# Patient Record
Sex: Female | Born: 1965
Health system: Southern US, Community
[De-identification: ages and names within clinical notes are randomized; demographics above are authoritative.]

## PROBLEM LIST (undated history)

## (undated) DIAGNOSIS — G43909 Migraine, unspecified, not intractable, without status migrainosus: Secondary | ICD-10-CM

## (undated) DIAGNOSIS — I341 Nonrheumatic mitral (valve) prolapse: Secondary | ICD-10-CM

## (undated) DIAGNOSIS — K219 Gastro-esophageal reflux disease without esophagitis: Secondary | ICD-10-CM

## (undated) DIAGNOSIS — F419 Anxiety disorder, unspecified: Secondary | ICD-10-CM

## (undated) DIAGNOSIS — E785 Hyperlipidemia, unspecified: Secondary | ICD-10-CM

## (undated) DIAGNOSIS — IMO0002 Reserved for concepts with insufficient information to code with codable children: Secondary | ICD-10-CM

## (undated) DIAGNOSIS — R7303 Prediabetes: Secondary | ICD-10-CM

## (undated) DIAGNOSIS — C50919 Malignant neoplasm of unspecified site of unspecified female breast: Secondary | ICD-10-CM

## (undated) DIAGNOSIS — IMO0001 Reserved for inherently not codable concepts without codable children: Secondary | ICD-10-CM

## (undated) DIAGNOSIS — N920 Excessive and frequent menstruation with regular cycle: Secondary | ICD-10-CM

## (undated) DIAGNOSIS — R87619 Unspecified abnormal cytological findings in specimens from cervix uteri: Secondary | ICD-10-CM

## (undated) DIAGNOSIS — K589 Irritable bowel syndrome without diarrhea: Secondary | ICD-10-CM

## (undated) DIAGNOSIS — J302 Other seasonal allergic rhinitis: Secondary | ICD-10-CM

## (undated) HISTORY — DX: Malignant neoplasm of unspecified site of unspecified female breast: C50.919

## (undated) HISTORY — PX: OTHER SURGICAL HISTORY: SHX169

## (undated) HISTORY — DX: Nonrheumatic mitral (valve) prolapse: I34.1

## (undated) HISTORY — DX: Irritable bowel syndrome, unspecified: K58.9

## (undated) HISTORY — PX: COLONOSCOPY: SHX174

## (undated) HISTORY — DX: Unspecified abnormal cytological findings in specimens from cervix uteri: R87.619

## (undated) HISTORY — DX: Excessive and frequent menstruation with regular cycle: N92.0

## (undated) HISTORY — DX: Reserved for concepts with insufficient information to code with codable children: IMO0002

## (undated) HISTORY — DX: Hyperlipidemia, unspecified: E78.5

## (undated) HISTORY — DX: Migraine, unspecified, not intractable, without status migrainosus: G43.909

---

## 2002-03-06 ENCOUNTER — Other Ambulatory Visit: Admission: RE | Admit: 2002-03-06 | Discharge: 2002-03-06 | Payer: Self-pay | Admitting: *Deleted

## 2002-09-17 ENCOUNTER — Inpatient Hospital Stay (HOSPITAL_COMMUNITY): Admission: AD | Admit: 2002-09-17 | Discharge: 2002-09-19 | Payer: Self-pay | Admitting: Gynecology

## 2002-09-18 ENCOUNTER — Encounter (INDEPENDENT_AMBULATORY_CARE_PROVIDER_SITE_OTHER): Payer: Self-pay | Admitting: Specialist

## 2002-11-06 ENCOUNTER — Other Ambulatory Visit: Admission: RE | Admit: 2002-11-06 | Discharge: 2002-11-06 | Payer: Self-pay | Admitting: Gynecology

## 2003-12-21 ENCOUNTER — Other Ambulatory Visit: Admission: RE | Admit: 2003-12-21 | Discharge: 2003-12-21 | Payer: Self-pay | Admitting: Gynecology

## 2005-01-06 ENCOUNTER — Other Ambulatory Visit: Admission: RE | Admit: 2005-01-06 | Discharge: 2005-01-06 | Payer: Self-pay | Admitting: Obstetrics and Gynecology

## 2005-03-05 ENCOUNTER — Ambulatory Visit (HOSPITAL_COMMUNITY): Admission: RE | Admit: 2005-03-05 | Discharge: 2005-03-05 | Payer: Self-pay | Admitting: Obstetrics and Gynecology

## 2005-07-21 ENCOUNTER — Inpatient Hospital Stay (HOSPITAL_COMMUNITY): Admission: AD | Admit: 2005-07-21 | Discharge: 2005-07-23 | Payer: Self-pay | Admitting: Obstetrics and Gynecology

## 2006-02-11 ENCOUNTER — Other Ambulatory Visit: Admission: RE | Admit: 2006-02-11 | Discharge: 2006-02-11 | Payer: Self-pay | Admitting: Obstetrics and Gynecology

## 2008-01-18 ENCOUNTER — Encounter: Admission: RE | Admit: 2008-01-18 | Discharge: 2008-01-18 | Payer: Self-pay | Admitting: Obstetrics and Gynecology

## 2008-10-24 ENCOUNTER — Encounter: Admission: RE | Admit: 2008-10-24 | Discharge: 2008-10-24 | Payer: Self-pay | Admitting: Psychiatry

## 2009-01-28 ENCOUNTER — Encounter: Admission: RE | Admit: 2009-01-28 | Discharge: 2009-01-28 | Payer: Self-pay | Admitting: Obstetrics and Gynecology

## 2009-06-01 HISTORY — PX: VULVA /PERINEUM BIOPSY: SHX319

## 2010-02-25 ENCOUNTER — Encounter: Admission: RE | Admit: 2010-02-25 | Discharge: 2010-02-25 | Payer: Self-pay | Admitting: Obstetrics and Gynecology

## 2010-10-17 NOTE — H&P (Signed)
[name], [name] NO.:  [id number]   MEDICAL RECORD NO.:  [id number]          PATIENT TYPE:  INP   LOCATION:  9138                          FACILITY:  [facility]   PHYSICIAN:  Janine Limbo, M.D.DATE OF BIRTH:  1966/01/21   DATE OF ADMISSION:  07/21/2005  DATE OF DISCHARGE:                                HISTORY & PHYSICAL   Ms. [name] is a 45 year old gravida 4, para 2-0-1-2 who presents at 41 weeks'  gestation, EDD August 02, 2005.  She presents in advanced active labor,  completely dilated with a strong urge to push, meconium-stained amniotic  fluid is noted.  She delivered rapidly with Dr. Marline Backbone in  attendance.  A spontaneous vaginal delivery with the birth of a viable  female infant, named Debbie [name], with Apgar scores of nine at one-minute and  nine at five minutes.  There was no meconium noted below the cord.  Repair  of lacerations was done by Dr. Marline Backbone.   This patient's pregnancy was followed by the M.D. service at [facility] and has  been essentially unremarkable.  She has been size equal to dates throughout,  normotensive with no proteinuria.  The patient's pregnancy is remarkable  for:  1.  Advanced maternal age, declined amnio.  2.  Irritable bowel syndrome.  3.  Migraines.  4.  Questionable LMP.  5.  Pregnancy remarkable for that the patient does have a history of anxiety      and depression and does take Paxil.   Prenatal lab work, on February 09, 2005, hemoglobin and hematocrit 11.7 and  37.5, platelets 282,000.  Blood type and Rh A+, antibody screen negative,  VDRL nonreactive, rubella immune, hepatitis B surface antigen negative, HIV  declined.  CF testing declined.  Pap smear within normal limits.  GC and  chlamydia negative.  At 28 weeks, one-hour glucose challenge within normal  limits and at 36 weeks culture of the vaginal tract is negative for group B  strep.   The patient did have early pregnancy ultrasound at 10 weeks 2  days on a  January 06, 2005.  On March 06, 2005 the patient had 18-week genetic anatomy  ultrasound.  Anatomy was found to be normal, nasal bone positive, gross  normal, decreasing patient's Down syndrome risk to  1 in 357.   OBSTETRICAL HISTORY:  1.  In 2000, the patient had a normal spontaneous vaginal delivery with the      birth of a 7 pound 1 ounce female infant, named Debbie [name], with no      complications.  2.  In 2003, the patient had a first trimester SAB with no complications.  3.  In 2004, the patient had a normal spontaneous vaginal delivery at 37      weeks with the birth of 5 pound 14 ounce female infant, named Debbie [name], with      no complications.  4.  This is her fourth and current pregnancy.   MEDICAL HISTORY:  1.  Mitral valve prolapse.  2.  Irritable bowel syndrome.  3.  Migraines.  4.  Wisdom tooth extraction in 1981.   FAMILY HISTORY:  The patient's mother and maternal grandmother with chronic  hypertension on medication.  Maternal grandmother with uterine and breast  cancer.   GENETIC HISTORY:  The patient is 45 years old, otherwise there is no family  history of familial or chromosomal disorders, children that were born with  birth defects, or any that died in infancy.   The patient has no known drug allergies.   She denies the use of tobacco, alcohol, or illicit drugs   SOCIAL HISTORY:  [name] is a married Caucasian female.  She is a  Futures trader.  Her husband, Debbie [name], is involved and supportive.  He works  as a Engineer, site.  They do not subscribe to a religious faith.   REVIEW OF SYSTEMS:  As described above.  The patient presents to [facility] [place] in advanced active labor and quickly delivers by  spontaneous vaginal delivery of viable female infant, named Debbie [name], with  Apgar scores of nine at one minute and nine at five minutes with Dr. Marline Backbone in attendance.   PHYSICAL EXAMINATION:  VITAL SIGNS:  Stable.  The patient is  afebrile.  HEENT:  Unremarkable.  HEART:  Regular rate and rhythm.  LUNGS:  Clear.  ABDOMEN:  Nontender.  Fetal heart rate, on admission, showed a reactive NST  and a negative CST.   Vaginal delivery occurred spontaneously with the birth of a viable female  infant, Debbie [name], Apgar nine and nine, repair of second degree midline  laceration and first-degree right and left lacerations was performed by Dr.  Marline Backbone.   ASSESSMENT:  1.  Intrauterine pregnancy at term.  2.  Normal spontaneous vaginal delivery.  3.  Repair of second-degree midline laceration and first degree right and      left vaginal lacerations.   PLAN:  Admit with orders per Dr. Marita [place] stringer      Rica Koyanagi, C.N.M.      Janine Limbo, M.D.  Electronically Signed    SDM/MEDQ  D:  07/21/2005  T:  07/21/2005  Job:  [id number]

## 2010-10-17 NOTE — Discharge Summary (Signed)
   NAME:  Debbie [name], Debbie [name]                             ACCOUNT NO.:  [id number]   MEDICAL RECORD NO.:  [id number]                   PATIENT TYPE:  INP   LOCATION:  9142                                 [facility]ACILITY:  [facility]   PHYSICIAN:  Ivor Costa. Farrel Gobble, M.D.              DATE OF BIRTH:  1966-04-07   DATE OF ADMISSION:  09/17/2002  DATE OF DISCHARGE:  09/19/2002                                 DISCHARGE SUMMARY   DISCHARGE DIAGNOSES:  1. Intrauterine pregnancy at 37 weeks, delivered.  2. Advanced maternal age.  3. Mitral valve prolapse with regurgitation requiring spontaneous bacterial     endocarditis prophylaxis.  4. Status post spontaneous vaginal delivery.  5. Excision of left lower minor cyst at the time of delivery.   HISTORY OF PRESENT ILLNESS:  This is a 45 year old female gravida 3, para 1  with EDC of Oct 05, 2002. Prenatal course was complicated by advanced  maternal age. The patient declined amniocentesis. Also has mitral valve  prolapse with regurgitation requiring SBE prophylaxis.   HOSPITAL COURSE:  On September 17, 2002, the patient was admitted with  spontaneous rupture of membranes, mild contractions. Labor was augmented  with Pitocin and the patient was begun on IV penicillin G for MVP  prophylaxis, SBE prophylaxis. The patient had a spontaneous vaginal delivery  on September 17, 2002, of a female with Apgars of 6 and 7. Weight of 5 pounds 14  ounces. It was noted that pediatrics were alerted and assessed the baby post  delivery secondary to poor cry. Newborn nursery for observation. Cord pH was  obtained and sent though it was reported clotted. There was noted to be a  left lower minor cyst approximately 1.5 cm, which was excised and defect  closed. Postpartum, the patient remained afebrile, voiding, and in stable  condition. She was discharged to home on September 19, 2002, in satisfactory  condition.   LABORATORY DATA:  A+, Rubella immune, alpha IV 1904, hemoglobin 10.3.   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   DISPOSITION:  The patient is discharged to home.   FOLLOW UP:  To return to the office in six weeks. If any problems prior to  that time will be seen in the office.   DISCHARGE MEDICATIONS:  Will continue her own Paxil, which she had started  during pregnancy.     Susa Loffler, P.A.                    Ivor Costa. Farrel Gobble, M.D.    TSG/MEDQ  D:  10/20/2002  T:  10/20/2002  Job:  [id number]

## 2011-03-04 ENCOUNTER — Other Ambulatory Visit: Payer: Self-pay | Admitting: Obstetrics and Gynecology

## 2011-03-04 DIAGNOSIS — Z1231 Encounter for screening mammogram for malignant neoplasm of breast: Secondary | ICD-10-CM

## 2011-03-20 ENCOUNTER — Ambulatory Visit: Payer: Self-pay

## 2011-03-25 ENCOUNTER — Ambulatory Visit
Admission: RE | Admit: 2011-03-25 | Discharge: 2011-03-25 | Disposition: A | Discharge status: Home / Self Care | Payer: BC Managed Care – PPO | Source: Ambulatory Visit | Attending: Obstetrics and Gynecology | Admitting: Obstetrics and Gynecology

## 2011-03-25 DIAGNOSIS — Z1231 Encounter for screening mammogram for malignant neoplasm of breast: Secondary | ICD-10-CM

## 2011-06-02 HISTORY — PX: ENDOMETRIAL ABLATION: SHX621

## 2011-09-01 ENCOUNTER — Encounter (INDEPENDENT_AMBULATORY_CARE_PROVIDER_SITE_OTHER): Payer: BC Managed Care – PPO | Admitting: Obstetrics and Gynecology

## 2011-09-01 DIAGNOSIS — E569 Vitamin deficiency, unspecified: Secondary | ICD-10-CM

## 2011-09-01 DIAGNOSIS — N390 Urinary tract infection, site not specified: Secondary | ICD-10-CM

## 2011-09-01 DIAGNOSIS — N920 Excessive and frequent menstruation with regular cycle: Secondary | ICD-10-CM

## 2011-09-01 DIAGNOSIS — R350 Frequency of micturition: Secondary | ICD-10-CM

## 2011-09-07 ENCOUNTER — Other Ambulatory Visit: Payer: Self-pay

## 2011-09-07 DIAGNOSIS — N926 Irregular menstruation, unspecified: Secondary | ICD-10-CM

## 2011-09-08 ENCOUNTER — Telehealth: Payer: Self-pay | Admitting: Obstetrics and Gynecology

## 2011-09-09 ENCOUNTER — Telehealth: Payer: Self-pay | Admitting: Obstetrics and Gynecology

## 2011-09-09 ENCOUNTER — Telehealth: Payer: Self-pay

## 2011-09-09 NOTE — Telephone Encounter (Signed)
Talk to pt concerning her labs. Informed pt that her tsh was abnormal and she need to schedule appt with pcp(will fax records to Dr. Tally Joe) Also, her vit d level is abnl and will fax rx to pt pharmacy per protocol.

## 2011-09-09 NOTE — Telephone Encounter (Signed)
Patient contacted by Winfred Leeds, medical assistant and advised of [facility] appointment.  Pt. Verified receipt of message regarding her abnormal TSH and is scheduled to see PCP, Dr. Erling Conte.

## 2011-09-10 ENCOUNTER — Encounter: Payer: Self-pay | Admitting: Obstetrics and Gynecology

## 2011-09-10 ENCOUNTER — Ambulatory Visit (INDEPENDENT_AMBULATORY_CARE_PROVIDER_SITE_OTHER): Payer: BC Managed Care – PPO | Admitting: Obstetrics and Gynecology

## 2011-09-10 VITALS — BP 140/88 | HR 88

## 2011-09-10 DIAGNOSIS — I059 Rheumatic mitral valve disease, unspecified: Secondary | ICD-10-CM

## 2011-09-10 DIAGNOSIS — K589 Irritable bowel syndrome without diarrhea: Secondary | ICD-10-CM | POA: Insufficient documentation

## 2011-09-10 DIAGNOSIS — I341 Nonrheumatic mitral (valve) prolapse: Secondary | ICD-10-CM | POA: Insufficient documentation

## 2011-09-10 DIAGNOSIS — G43909 Migraine, unspecified, not intractable, without status migrainosus: Secondary | ICD-10-CM | POA: Insufficient documentation

## 2011-09-10 DIAGNOSIS — N920 Excessive and frequent menstruation with regular cycle: Secondary | ICD-10-CM

## 2011-09-10 MED ORDER — NORETHINDRONE-ETH ESTRADIOL 0.5-35 MG-MCG PO TABS: ORAL_TABLET | ORAL | Status: DC

## 2011-09-10 MED ORDER — PROMETHAZINE HCL 12.5 MG PO TABS: 12.5000 mg | ORAL_TABLET | Freq: Four times a day (QID) | ORAL | Status: DC | PRN

## 2011-09-10 MED ORDER — INTEGRA PLUS PO CAPS: 1.0000 | ORAL_CAPSULE | Freq: Every day | ORAL | Status: DC

## 2011-09-10 NOTE — Patient Instructions (Signed)
Take pills to stop bleeding 3 times daily for 7 days. May need to take medication for nausea.  Patient to receive information on endometrial ablation

## 2011-09-10 NOTE — Telephone Encounter (Signed)
LM FOR PT TO CALL BACK. 

## 2011-09-10 NOTE — Telephone Encounter (Signed)
TC TO PT REGARDING IRREG. BLDG. PT STATES THAT SHE IS STILL BLDG AND WANT TO KNOW WHAT TO DO. SCHEDULE PT AN APPT. FOR TODAY AT 3:30PM. PT VOICED UNDERSTANDING.

## 2011-09-10 NOTE — Progress Notes (Signed)
Patient seen last week for menorrhagia since 07/15/11 and given Provera 10 mg q 6 hrs until bleeding stopped and was to take then 1 po qd. Patient admits to bleeding only slowing but not stopping.  Has no cramping but has soiled clothes. Denies tobacco use and no FH of VTE.  H/H (09/02/2011)=12.5/38.3  O: Abdomen: BS+, soft, non-tender      Pelvic: EGBUS-blood stained, vagina-clots and moderate      blood, uterus NSSC, cervix without lesions or tenderness      adnexae without tenderness.  A: Menorrhagia     Abnormal thyroid test  P: Ovcon 35 active pills tid x 7 days     Pt. seeing PCP tomorrow for thyroid test followup     Phenergan 12.5 mg q 6 hrs prn-nausea      samples Integra Plus  #12 1 po qd

## 2011-09-11 ENCOUNTER — Telehealth: Payer: Self-pay | Admitting: Obstetrics and Gynecology

## 2011-09-11 ENCOUNTER — Encounter: Payer: BC Managed Care – PPO | Admitting: Obstetrics and Gynecology

## 2011-09-15 NOTE — Telephone Encounter (Signed)
Phone message left with patient regarding Necon prescription instructions.  This was necessary as her insurance would not allow coverage of the medication with the directions (1 tablet 3 x daily for 7 days).

## 2011-09-16 ENCOUNTER — Encounter: Payer: Self-pay | Admitting: Obstetrics and Gynecology

## 2011-09-16 ENCOUNTER — Other Ambulatory Visit: Payer: BC Managed Care – PPO

## 2011-09-16 ENCOUNTER — Ambulatory Visit (INDEPENDENT_AMBULATORY_CARE_PROVIDER_SITE_OTHER): Payer: BC Managed Care – PPO

## 2011-09-16 ENCOUNTER — Other Ambulatory Visit: Payer: Self-pay | Admitting: Obstetrics and Gynecology

## 2011-09-16 ENCOUNTER — Ambulatory Visit (INDEPENDENT_AMBULATORY_CARE_PROVIDER_SITE_OTHER): Payer: BC Managed Care – PPO | Admitting: Obstetrics and Gynecology

## 2011-09-16 VITALS — BP 120/70 | HR 72 | Wt 200.0 lb

## 2011-09-16 DIAGNOSIS — N926 Irregular menstruation, unspecified: Secondary | ICD-10-CM

## 2011-09-16 DIAGNOSIS — N920 Excessive and frequent menstruation with regular cycle: Secondary | ICD-10-CM | POA: Insufficient documentation

## 2011-09-16 DIAGNOSIS — N8500 Endometrial hyperplasia, unspecified: Secondary | ICD-10-CM

## 2011-09-16 NOTE — Progress Notes (Signed)
Patient with menorrhagia since 07/03/2011, unresponsive to Provera and finally achieve cessation with Necon 1/35 tid x 7 days.  Presents for [facility] and endometrial biopsy.  Previously reviewed with patient management options to include hormonal, Mirena IUD, ablation and hysterectomy. Patient wants ablation if all goes well.  SHG: (prelim)- Uterus: 10.9 x 6.14 x 5.04 cm with endometrium measuring 1.54.  3D imaging of uterine cavity demonstrates normal shaped cavity with focal thickening demonstrated with saline infusion yielding question of synechiae vs mass,  ovaries appeared normal.  Endometrial biopsy performed without difficulty-specimen sent to pathology.  A: Menorrhagia      SHG with endometrial synechiae vs mass      Endometrial biopsy  P: 1. Endometrial sampling to pathology.     2. Patient would like to proceed with ablation if endometrial         biopsy allows.      3. Reviewed hysteroscopy, D & C along with endometrial          ablation.  Also discussed hyperplasia and endometrial          cancer management.      4. Patient would like for Dr. Estanislado Pandy to perform her          procedures

## 2011-09-16 NOTE — Patient Instructions (Signed)
Call for excessive pain or heavy bleeding

## 2011-09-16 NOTE — Progress Notes (Signed)
Addended by: Henreitta Leber on: 09/16/2011 06:45 PM   Modules accepted: Level of Service

## 2011-09-18 LAB — PATHOLOGY

## 2011-09-22 ENCOUNTER — Encounter: Payer: Self-pay | Admitting: Obstetrics and Gynecology

## 2011-09-22 ENCOUNTER — Telehealth: Payer: Self-pay | Admitting: Obstetrics and Gynecology

## 2011-09-22 ENCOUNTER — Ambulatory Visit (INDEPENDENT_AMBULATORY_CARE_PROVIDER_SITE_OTHER): Payer: BC Managed Care – PPO | Admitting: Obstetrics and Gynecology

## 2011-09-22 ENCOUNTER — Encounter: Payer: BC Managed Care – PPO | Admitting: Obstetrics and Gynecology

## 2011-09-22 VITALS — BP 110/72 | Temp 97.9°F | Resp 16 | Ht 64.0 in | Wt 198.0 lb

## 2011-09-22 DIAGNOSIS — N898 Other specified noninflammatory disorders of vagina: Secondary | ICD-10-CM

## 2011-09-22 DIAGNOSIS — R109 Unspecified abdominal pain: Secondary | ICD-10-CM

## 2011-09-22 DIAGNOSIS — N939 Abnormal uterine and vaginal bleeding, unspecified: Secondary | ICD-10-CM

## 2011-09-22 LAB — POCT URINE PREGNANCY: Preg Test, Ur: NEGATIVE

## 2011-09-22 LAB — POCT URINALYSIS DIPSTICK
Bilirubin, UA: NEGATIVE
Blood, UA: 6
Glucose, UA: NEGATIVE
Ketones, UA: NEGATIVE
Nitrite, UA: NEGATIVE
Spec Grav, UA: 1.025
Urobilinogen, UA: NEGATIVE
pH, UA: 5

## 2011-09-22 NOTE — Telephone Encounter (Signed)
Debbie [name] has cht/being reviewed

## 2011-09-22 NOTE — Telephone Encounter (Signed)
TC from pt.   States is having heavy bleeding changing pad q hr.Has had increased bleeding since 09/20/11 but has had bleeding since Feb. 2013. Has tried various medications but not improvement.   Sched for eval w/Dr AVS today.

## 2011-09-22 NOTE — Progress Notes (Addendum)
Contraception: no Fibroids: no Hormone Therapy: no New Medications: no Menopausal Symptoms: no Vag. Discharge: no Abdominal Pain: yes Increased Stress: no  [name] is a 46 y.o. year old female.  Subjective:  C/O continued bleeding since she stopped three OCPs per day. Endo Bx: benign. Hgb: 12.5. [place]: Nl female organs. Synechiae vs mass. Thyroid studies said to be normal.  Objective:  BP 110/72  Temp 97.9 F (36.6 C)  Resp 16  Ht 5\' 4"  (1.626 m)  Wt 198 lb (89.812 kg)  BMI 33.99 kg/m2  LMP 07/03/2011   Chest: Clear Heart: RRR Abd: nontender, no masses Back: No CVA tenderness Pelvic: EGBUS: Bloody Vag: Menstrual Blood Cx: Closed Uterus: NSSC, Nontender Adn: No masses  Assessment:  Menorrhagia  Plan:  Management options reviewed. R and B discussed. Questions answered. Pt wants D and C with Ablation ASAP!  Leonard Schwartz M.D.  09/22/2011 8:40 PM

## 2011-09-23 ENCOUNTER — Other Ambulatory Visit: Payer: Self-pay | Admitting: Obstetrics and Gynecology

## 2011-09-23 NOTE — Progress Notes (Signed)
Addended by: Janine Limbo on: 09/23/2011 07:01 PM   Modules accepted: Orders

## 2011-09-24 ENCOUNTER — Encounter (HOSPITAL_COMMUNITY): Payer: Self-pay | Admitting: *Deleted

## 2011-09-24 NOTE — H&P (Signed)
Debbie [name], Debbie [name] NO.:  [id number]  MEDICAL RECORD NO.:  [id number]  LOCATION:  PERIO                         FACILITY:  [facility]  PHYSICIAN:  Janine Limbo, M.D.DATE OF BIRTH:  1965-06-22  DATE OF ADMISSION:  09/23/2011 DATE OF DISCHARGE:                             HISTORY & PHYSICAL   Date for surgery is September 25, 2011.  HISTORY OF PRESENT ILLNESS:  Debbie [name] is a 46 year old female, para 3-0- 1-3, who presents for a dilatation and curettage.  She will also have a NovaSure ablation of the endometrium.  The patient has been followed at the [facility].  She complains of menorrhagia.  An ultrasound was performed, that showed a 10.9 x 6.1 cm uterus.  The endometrium measured 1.54 cm.  The ovaries appeared normal.  A sonohysterogram showed a question of synechia versus an intrauterine mass.  An endometrial biopsy returned showing benign elements. Her laboratory evaluation was normal. She did have 100,000 colonies of beta-streptococcus on a urine sample. Her vitamin D was slightly low at 23.  The patient's husband has had a vasectomy.  OBSTETRICAL HISTORY:  The patient has had 3 term vaginal deliveries and 1 miscarriage.  DRUG ALLERGIES:  No known drug allergies.  PAST MEDICAL HISTORY:  The patient has a history of migraine headaches. She has a history of mitral valve prolapse and irritable bowel syndrome. She had her wisdom teeth removed in 1981.  SOCIAL HISTORY:  The patient denies cigarette use, alcohol use, and recreational drug use.  REVIEW OF SYSTEMS:  Please see history of present illness.  Family history is noncontributory.  PHYSICAL EXAMINATION:  VITAL SIGNS:  Weight is 200 pounds.  Her height is 5 feet 4 inches. HEENT:  Within normal limits. CHEST:  Clear. HEART:  Regular rate and rhythm. BREASTS:  Without masses. ABDOMEN:  Nontender. EXTREMITIES: Grossly  normal. NEUROLOGIC: Grossly normal. PELVIC: External genitalia is normal.  The vagina is normal.  Cervix is nontender.  Uterus is upper limits normal size.  Adnexa, no masses.  ASSESSMENT: 1. Menorrhagia. 2. Questionable intrauterine synechia versus an intrauterine mass. 3. Overweight. 4. Migraine headaches.  PLAN:  The patient will undergo a hysteroscopy with dilatation and curettage.  She will also have NovaSure ablation of the endometrium. The patient understands the indications for her surgical procedure, and she accepts the risks of, but not limited to, anesthetic complications, bleeding, infections, and possible damage to the surrounding organs.     Janine Limbo, M.D.     AVS/MEDQ  D:  09/24/2011  T:  09/24/2011  Job:  [id number]

## 2011-09-25 ENCOUNTER — Encounter (HOSPITAL_COMMUNITY): Payer: Self-pay | Admitting: *Deleted

## 2011-09-25 ENCOUNTER — Encounter (HOSPITAL_COMMUNITY): Payer: Self-pay | Admitting: Anesthesiology

## 2011-09-25 ENCOUNTER — Encounter (HOSPITAL_COMMUNITY): Payer: Self-pay | Admitting: Pharmacy Technician

## 2011-09-25 ENCOUNTER — Encounter (HOSPITAL_COMMUNITY)
Admission: RE | Disposition: A | Discharge status: Home / Self Care | Payer: Self-pay | Source: Ambulatory Visit | Attending: Obstetrics and Gynecology

## 2011-09-25 ENCOUNTER — Ambulatory Visit (HOSPITAL_COMMUNITY)
Admission: RE | Admit: 2011-09-25 | Discharge: 2011-09-25 | Disposition: A | Discharge status: Home / Self Care | Payer: BC Managed Care – PPO | Source: Ambulatory Visit | Attending: Obstetrics and Gynecology | Admitting: Obstetrics and Gynecology

## 2011-09-25 ENCOUNTER — Ambulatory Visit (HOSPITAL_COMMUNITY): Payer: BC Managed Care – PPO | Admitting: Anesthesiology

## 2011-09-25 DIAGNOSIS — N920 Excessive and frequent menstruation with regular cycle: Secondary | ICD-10-CM | POA: Insufficient documentation

## 2011-09-25 HISTORY — DX: Other seasonal allergic rhinitis: J30.2

## 2011-09-25 HISTORY — DX: Anxiety disorder, unspecified: F41.9

## 2011-09-25 LAB — CBC
HCT: 36.1 % (ref 36.0–46.0)
Hemoglobin: 11.5 g/dL — ABNORMAL LOW (ref 12.0–15.0)
MCH: 27.6 pg (ref 26.0–34.0)
MCHC: 31.9 g/dL (ref 30.0–36.0)
MCV: 86.8 fL (ref 78.0–100.0)
Platelets: 364 10*3/uL (ref 150–400)
RBC: 4.16 MIL/uL (ref 3.87–5.11)
RDW: 14.2 % (ref 11.5–15.5)
WBC: 9.9 10*3/uL (ref 4.0–10.5)

## 2011-09-25 LAB — PREGNANCY, URINE: Preg Test, Ur: NEGATIVE

## 2011-09-25 SURGERY — DILATATION & CURETTAGE/HYSTEROSCOPY WITH NOVASURE ABLATION
Anesthesia: General | Site: Vagina | Wound class: Clean Contaminated

## 2011-09-25 MED ORDER — PROPOFOL 10 MG/ML IV EMUL
INTRAVENOUS | Status: DC | PRN
  Administered 2011-09-25: 200 mg via INTRAVENOUS

## 2011-09-25 MED ORDER — DEXAMETHASONE SODIUM PHOSPHATE 4 MG/ML IJ SOLN
INTRAMUSCULAR | Status: DC | PRN
  Administered 2011-09-25: 10 mg via INTRAVENOUS

## 2011-09-25 MED ORDER — MIDAZOLAM HCL 5 MG/5ML IJ SOLN
INTRAMUSCULAR | Status: DC | PRN
  Administered 2011-09-25: 2 mg via INTRAVENOUS

## 2011-09-25 MED ORDER — LIDOCAINE HCL (CARDIAC) 20 MG/ML IV SOLN
INTRAVENOUS | Status: DC | PRN
  Administered 2011-09-25: 100 mg via INTRAVENOUS

## 2011-09-25 MED ORDER — BUPIVACAINE-EPINEPHRINE 0.5% -1:200000 IJ SOLN
INTRAMUSCULAR | Status: DC | PRN
  Administered 2011-09-25: 10 mL

## 2011-09-25 MED ORDER — IBUPROFEN 200 MG PO TABS: 800.0000 mg | ORAL_TABLET | Freq: Three times a day (TID) | ORAL | Status: AC | PRN

## 2011-09-25 MED ORDER — ONDANSETRON HCL 4 MG/2ML IJ SOLN
INTRAMUSCULAR | Status: DC | PRN
  Administered 2011-09-25: 4 mg via INTRAVENOUS

## 2011-09-25 MED ORDER — HYDROCODONE-ACETAMINOPHEN 5-325 MG PO TABS
ORAL_TABLET | ORAL | Status: AC
  Administered 2011-09-25: 1 via ORAL
  Filled 2011-09-25: qty 1

## 2011-09-25 MED ORDER — ONDANSETRON HCL 4 MG/2ML IJ SOLN
INTRAMUSCULAR | Status: AC
  Filled 2011-09-25: qty 2

## 2011-09-25 MED ORDER — LACTATED RINGERS IV SOLN
INTRAVENOUS | Status: DC
  Administered 2011-09-25: 12:00:00 via INTRAVENOUS

## 2011-09-25 MED ORDER — FENTANYL CITRATE 0.05 MG/ML IJ SOLN
INTRAMUSCULAR | Status: AC
  Filled 2011-09-25: qty 2

## 2011-09-25 MED ORDER — BUPIVACAINE-EPINEPHRINE (PF) 0.5% -1:200000 IJ SOLN
INTRAMUSCULAR | Status: AC
  Filled 2011-09-25: qty 10

## 2011-09-25 MED ORDER — PROPOFOL 10 MG/ML IV EMUL
INTRAVENOUS | Status: AC
  Filled 2011-09-25: qty 20

## 2011-09-25 MED ORDER — PROMETHAZINE HCL 12.5 MG PO TABS: 12.5000 mg | ORAL_TABLET | Freq: Four times a day (QID) | ORAL | Status: AC | PRN

## 2011-09-25 MED ORDER — DEXAMETHASONE SODIUM PHOSPHATE 10 MG/ML IJ SOLN
INTRAMUSCULAR | Status: AC
  Filled 2011-09-25: qty 1

## 2011-09-25 MED ORDER — MIDAZOLAM HCL 2 MG/2ML IJ SOLN
INTRAMUSCULAR | Status: AC
  Filled 2011-09-25: qty 2

## 2011-09-25 MED ORDER — LACTATED RINGERS IV SOLN
INTRAVENOUS | Status: DC | PRN
  Administered 2011-09-25: 3000 mL via INTRAVENOUS

## 2011-09-25 MED ORDER — FENTANYL CITRATE 0.05 MG/ML IJ SOLN
INTRAMUSCULAR | Status: DC | PRN
  Administered 2011-09-25: 25 ug via INTRAVENOUS
  Administered 2011-09-25: 50 ug via INTRAVENOUS

## 2011-09-25 MED ORDER — HYDROCODONE-ACETAMINOPHEN 5-325 MG PO TABS
1.0000 | ORAL_TABLET | Freq: Once | ORAL | Status: AC
  Administered 2011-09-25: 1 via ORAL

## 2011-09-25 MED ORDER — KETOROLAC TROMETHAMINE 30 MG/ML IJ SOLN
INTRAMUSCULAR | Status: DC | PRN
  Administered 2011-09-25: 30 mg via INTRAMUSCULAR
  Administered 2011-09-25: 30 mg via INTRAVENOUS

## 2011-09-25 MED ORDER — LIDOCAINE HCL (CARDIAC) 20 MG/ML IV SOLN
INTRAVENOUS | Status: AC
  Filled 2011-09-25: qty 5

## 2011-09-25 MED ORDER — HYDROCODONE-ACETAMINOPHEN 5-500 MG PO TABS: 1.0000 | ORAL_TABLET | ORAL | Status: AC | PRN

## 2011-09-25 SURGICAL SUPPLY — 15 items
CANISTER SUCTION 2500CC (MISCELLANEOUS) ×2 IMPLANT
CATH ROBINSON RED A/P 16FR (CATHETERS) ×2 IMPLANT
CLOTH BEACON ORANGE TIMEOUT ST (SAFETY) ×2 IMPLANT
CONTAINER PREFILL 10% NBF 60ML (FORM) ×2 IMPLANT
ELECT REM PT RETURN 9FT ADLT (ELECTROSURGICAL) ×2
ELECTRODE REM PT RTRN 9FT ADLT (ELECTROSURGICAL) ×1 IMPLANT
GLOVE BIOGEL PI IND STRL 8.5 (GLOVE) ×1 IMPLANT
GLOVE BIOGEL PI INDICATOR 8.5 (GLOVE) ×1
GLOVE ECLIPSE 8.0 STRL XLNG CF (GLOVE) ×4 IMPLANT
GOWN PREVENTION PLUS LG XLONG (DISPOSABLE) ×2 IMPLANT
GOWN STRL REIN XL XLG (GOWN DISPOSABLE) ×2 IMPLANT
LOOP ANGLED CUTTING 22FR (CUTTING LOOP) IMPLANT
PACK HYSTEROSCOPY LF (CUSTOM PROCEDURE TRAY) ×2 IMPLANT
TOWEL OR 17X24 6PK STRL BLUE (TOWEL DISPOSABLE) ×4 IMPLANT
WATER STERILE IRR 1000ML POUR (IV SOLUTION) ×2 IMPLANT

## 2011-09-25 NOTE — Anesthesia Postprocedure Evaluation (Signed)
Anesthesia Post Note  Patient: Debbie [name]  Procedure(s) Performed: Procedure(s) (LRB): DILATATION & CURETTAGE/HYSTEROSCOPY WITH NOVASURE ABLATION (N/A)  Anesthesia type: General  Patient location: PACU  Post pain: Pain level controlled  Post assessment: Post-op Vital signs reviewed  Last Vitals:  Filed Vitals:   09/25/11 1415  BP: 126/51  Pulse: 77  Temp:   Resp:     Post vital signs: Reviewed  Level of consciousness: sedated  Complications: No apparent anesthesia complications

## 2011-09-25 NOTE — H&P (Signed)
The patient was interviewed and examined today.  The previously documented history and physical examination was reviewed. There are no changes. The operative procedure was reviewed. The risks and benefits were outlined again. The specific risks include, but are not limited to, anesthetic complications, bleeding, infections, and possible damage to the surrounding organs. The patient's questions were answered.  We are ready to proceed as outlined. The likelihood of the patient achieving the goals of this procedure is very likely.   Debbie [name], M.D.  

## 2011-09-25 NOTE — Discharge Instructions (Signed)
Endometrial Ablation Endometrial ablation removes the lining of the uterus (endometrium). It is usually a same day, outpatient treatment. Ablation helps avoid major surgery (such as a hysterectomy). A hysterectomy is removal of the cervix and uterus. Endometrial ablation has less risk and complications, has a shorter recovery period and is less expensive. After endometrial ablation, most women will have little or no menstrual bleeding. You may not keep your fertility. Pregnancy is no longer likely after this procedure but if you are pre-menopausal, you still need to use a reliable method of birth control following the procedure because pregnancy can occur. REASONS TO HAVE THE PROCEDURE MAY INCLUDE:  Heavy periods.   Bleeding that is causing anemia.   Anovulatory bleeding, very irregular, bleeding.   Bleeding submucous fibroids (on the lining inside the uterus) if they are smaller than 3 centimeters.  REASONS NOT TO HAVE THE PROCEDURE MAY INCLUDE:  You wish to have more children.   You have a pre-cancerous or cancerous problem. The cause of any abnormal bleeding must be diagnosed before having the procedure.   You have pain coming from the uterus.   You have a submucus fibroid larger than 3 centimeters.   You recently had a baby.   You recently had an infection in the uterus.   You have a severe retro-flexed, tipped uterus and cannot insert the instrument to do the ablation.   You had a Cesarean section or deep major surgery on the uterus.   The inner cavity of the uterus is too large for the endometrial ablation instrument.  RISKS AND COMPLICATIONS   Perforation of the uterus.   Bleeding.   Infection of the uterus, bladder or vagina.   Injury to surrounding organs.   Cutting the cervix.   An air bubble to the lung (air embolus).   Pregnancy following the procedure.   Failure of the procedure to help the problem requiring hysterectomy.   Decreased ability to diagnose  cancer in the lining of the uterus.  BEFORE THE PROCEDURE  The lining of the uterus must be tested to make sure there is no pre-cancerous or cancer cells present.   Medications may be given to make the lining of the uterus thinner.   Ultrasound may be used to evaluate the size and look for abnormalities of the uterus.   Future pregnancy is not desired.  PROCEDURE  There are different ways to destroy the lining of the uterus.   Resectoscope - radio frequency-alternating electric current is the most common one used.   Cryotherapy - freezing the lining of the uterus.   Heated Free Liquid - heated salt (saline) solution inserted into the uterus.   Microwave - uses high energy microwaves in the uterus.   Thermal Balloon - a catheter with a balloon tip is inserted into the uterus and filled with heated fluid.  Your caregiver will talk with you about the method used in this clinic. They will also instruct you on the pros and cons of the procedure. Endometrial ablation is performed along with a procedure called operative hysteroscopy. A narrow viewing tube is inserted through the birth canal (vagina) and through the cervix into the uterus. A tiny camera attached to the viewing tube (hysteroscope) allows the uterine cavity to be shown on a TV monitor during surgery. Your uterus is filled with a harmless liquid to make the procedure easier. The lining of the uterus is then removed. The lining can also be removed with a resectoscope which allows your surgeon   to cut away the lining of the uterus under direct vision. Usually, you will be able to go home within an hour after the procedure. HOME CARE INSTRUCTIONS   Do not drive for 24 hours.   No tampons, douching or intercourse for 2 weeks or until your caregiver approves.   Rest at home for 24 to 48 hours. You may then resume normal activities unless told differently by your caregiver.   Take your temperature two times a day for 4 days, and record  it.   Take any medications your caregiver has ordered, as directed.   Use some form of contraception if you are pre-menopausal and do not want to get pregnant.  Bleeding after the procedure is normal. It varies from light spotting and mildly watery to bloody discharge for 4 to 6 weeks. You may also have mild cramping. Only take over-the-counter or prescription medicines for pain, discomfort, or fever as directed by your caregiver. Do not use aspirin, as this may aggravate bleeding. Frequent urination during the first 24 hours is normal. You will not know how effective your surgery is until at least 3 months after the surgery. SEEK IMMEDIATE MEDICAL CARE IF:   Bleeding is heavier than a normal menstrual cycle.   An oral temperature above 102 F (38.9 C) develops.   You have increasing cramps or pains not relieved with medication or develop belly (abdominal) pain which does not seem to be related to the same area of earlier cramping and pain.   You are light headed, weak or have fainting episodes.   You develop pain in the shoulder strap areas.   You have chest or leg pain.   You have abnormal vaginal discharge.   You have painful urination.  Document Released: 03/27/2004 Document Revised: 05/07/2011 Document Reviewed: 06/25/2007 ExitCare Patient Information 2012 ExitCare, LLCDISCHARGE INSTRUCTIONS: HYSTEROSCOPY / ENDOMETRIAL ABLATION The following instructions have been prepared to help you care for yourself upon your return home.  Personal hygiene: . Use sanitary pads for vaginal drainage, not tampons. . Shower the day after your procedure. . NO tub baths, pools or Jacuzzis for 2-3 weeks. . Wipe front to back after using the bathroom.  Activity and limitations: . Do NOT drive or operate any equipment for 24 hours. The effects of anesthesia are still present and drowsiness may result. . Do NOT rest in bed all day. . Walking is encouraged. . Walk up and down stairs slowly. .  You may resume your normal activity in one to two days or as indicated by your physician. Sexual activity: NO intercourse for at least 2 weeks after the procedure, or as indicated by your Doctor.  Diet: Eat a light meal as desired this evening. You may resume your usual diet tomorrow.  Return to Work: You may resume your work activities in one to two days or as indicated by your Doctor.  What to expect after your surgery: Expect to have vaginal bleeding/discharge for 2-3 days and spotting for up to 10 days. It is not unusual to have soreness for up to 1-2 weeks. You may have a slight burning sensation when you urinate for the first day. Mild cramps may continue for a couple of days. You may have a regular period in 2-6 weeks.  Call your doctor for any of the following: . Excessive vaginal bleeding or clotting, saturating and changing one pad every hour. . Inability to urinate 6 hours after discharge from hospital. . Pain not relieved by pain medication. .   Fever of 100.4 F or greater. . Unusual vaginal discharge or odor.  Return to office _________________Call for an appointment ___________________ Patient's signature: ______________________ Nurse's signature ________________________  Post Anesthesia Care Unit [phone] . 

## 2011-09-25 NOTE — Anesthesia Preprocedure Evaluation (Addendum)
Anesthesia Evaluation  Patient identified by MRN, date of birth, ID band Patient awake    Reviewed: Allergy & Precautions, H&P , NPO status , Patient's Chart, lab work & pertinent test results  Airway Mallampati: III TM Distance: >3 FB Neck ROM: full    Dental No notable dental hx. (+) Teeth Intact   Pulmonary neg pulmonary ROS,  breath sounds clear to auscultation  Pulmonary exam normal       Cardiovascular Rhythm:regular Rate:Normal  MVP   Neuro/Psych  Headaches, Anxiety Anxiety negative neurological ROS     GI/Hepatic negative GI ROS, Neg liver ROS, IBS   Endo/Other  Hyperlipidemia Obesity   Renal/GU negative Renal ROS  negative genitourinary   Musculoskeletal negative musculoskeletal ROS (+)   Abdominal Normal abdominal exam  (+)   Peds  Hematology negative hematology ROS (+)   Anesthesia Other Findings   Reproductive/Obstetrics negative OB ROS                          Anesthesia Physical Anesthesia Plan  ASA: II  Anesthesia Plan: General LMA   Post-op Pain Management:    Induction:   Airway Management Planned:   Additional Equipment:   Intra-op Plan:   Post-operative Plan:   Informed Consent:   Plan Discussed with:   Anesthesia Plan Comments:         Anesthesia Quick Evaluation

## 2011-09-25 NOTE — Anesthesia Procedure Notes (Signed)
Procedure Name: LMA Insertion Date/Time: 09/25/2011 12:28 PM Performed by: Kaiya Boatman, Jannet Askew Pre-anesthesia Checklist: Patient identified, Timeout performed, Emergency Drugs available, Suction available and Patient being monitored Patient Re-evaluated:Patient Re-evaluated prior to inductionOxygen Delivery Method: Circle system utilized Preoxygenation: Pre-oxygenation with 100% oxygen Intubation Type: IV induction Ventilation: Mask ventilation without difficulty LMA Size: 4.0 Number of attempts: 1 Placement Confirmation: positive ETCO2 and breath sounds checked- equal and bilateral Dental Injury: Teeth and Oropharynx as per pre-operative assessment

## 2011-09-25 NOTE — Transfer of Care (Signed)
Immediate Anesthesia Transfer of Care Note  Patient: Debbie [name]  Procedure(s) Performed: Procedure(s) (LRB): DILATATION & CURETTAGE/HYSTEROSCOPY WITH NOVASURE ABLATION (N/A)  Patient Location: PACU  Anesthesia Type: General  Level of Consciousness: awake, alert  and sedated  Airway & Oxygen Therapy: Patient Spontanous Breathing and Patient connected to nasal cannula oxygen  Post-op Assessment: Report given to PACU RN and Post -op Vital signs reviewed and stable  Post vital signs: Reviewed and stable  Complications: No apparent anesthesia complications

## 2011-09-25 NOTE — Op Note (Signed)
OPERATIVE NOTE  Debbie [name]  DOB:    [birth year]  MRN:    [id number]  CSN:    [id number]  Date of Surgery:  09/25/2011  Preoperative Diagnosis:  Menorrhagia  Intrauterine synechiae  Postoperative Diagnosis:  Menorrhagia  Procedure:  Hysteroscopy  Dilatation and curettage  NovaSure endometrial ablation  Surgeon:  Leonard Schwartz, M.D.  Assistant:  None  Anesthetic:  General  Disposition:  [name] is a 46 year old female, para 3-0-1-3, who presents with menorrhagia. Hormonal therapy has not relieved her discomfort. She wishes to proceed with initial ablation. She understands the indications for surgical procedure as well as her alternative treatment options. She accepts the risk of, but not limited to, anesthetic complications, bleeding, infections, and possible damage to the surrounding organs.  Findings:  On examination under anesthesia the uterus was noted to be upper limits normal size. No adnexal masses were appreciated. The uterus sounded to 11 cm. On hysteroscopy no pathology was appreciated within the uterine cavity. The cervical length was 4.5 cm. The cavity width was 4.6 cm.  Procedure:  The patient was taken to the operating room where a general anesthetic was given. The patient's lower abdomen, perineum, and vagina were prepped with multiple layers of Betadine. The bladder was drained of urine. The patient was sterilely draped. An examination under anesthesia was performed. A paracervical block was placed using 10 cc of half percent Marcaine with epinephrine. An endocervical curettage was performed. The uterine cavity was sounded to 11 cm. The diagnostic hysteroscope was inserted and the cavity was carefully inspected. No pathology was appreciated. Pictures were taken. The hysteroscope was removed. The cavity was then curetted using a medium sharp curet. The cavity was felt to be clean at the end of our procedure. The NovaSure apparatus was tested.  She was noted to operate properly. The NovaSure instrument was inserted into the uterine cavity. A test procedure was performed and the test indicated that the uterine cavity was intact. The ablation procedure was begun. The cavity was ablated for 47 seconds. The power was noted to be 164. At the end of our procedure hysteroscopy was again performed. The cavity was noted to be ablated. Pictures were taken. All instruments were then removed from the vagina. The patient's exam was repeated and the uterus was noted to be firm. Hemostasis was adequate. Sponge and needle counts were correct. Estimated blood loss was 20 cc. The estimated fluid deficit loss 45 cc. The patient was awakened from her anesthetic without difficulty. She was transported to the recovery room in stable condition. The endocervical curettings and endometrial curettings were sent to pathology.  Followup instructions:  The patient will return to see Dr. Stefano Gaul in 2 weeks. She will refrain from intercourse until she returns for her visit. The patient was given a copy of the postoperative instructions for patients who've undergone endometrial ablation.  Discharge medications:  Motrin 800 mg every 8 hours as needed for mild to moderate pain. Vicodin 5/500 one or 2 tablets every 4 hours as needed for severe pain. Bennigan 12.5 mg one tablet every 6 hours as needed for nausea.  Leonard Schwartz, M.D.

## 2011-10-08 ENCOUNTER — Ambulatory Visit (INDEPENDENT_AMBULATORY_CARE_PROVIDER_SITE_OTHER): Payer: BC Managed Care – PPO | Admitting: Obstetrics and Gynecology

## 2011-10-08 ENCOUNTER — Encounter: Payer: Self-pay | Admitting: Obstetrics and Gynecology

## 2011-10-08 VITALS — BP 118/70 | Temp 98.5°F | Ht 64.5 in | Wt 200.0 lb

## 2011-10-08 DIAGNOSIS — N920 Excessive and frequent menstruation with regular cycle: Secondary | ICD-10-CM

## 2011-10-08 NOTE — Progress Notes (Signed)
The patient had a hysteroscopy with D&C and endometrial ablation 2 weeks ago.  She is doing very well at this point.  Objective:  Abdomen nontender  Pelvic exam cervix and uterus is nontender  Path report: Benign endometrium  Return in October 2013 for annual exam. Return to normal activities. Call for problems.  Dr. Stefano Gaul

## 2011-10-08 NOTE — Progress Notes (Signed)
DATE OR SURGERY: 08/2011 ENDOMETRIAL ABLATION   PAIN:No VAG BLEEDING: no VAG DISCHARGE: no NORMAL GI FUNCTN: yes NORMAL GU FUNCTN: yes

## 2012-03-30 ENCOUNTER — Other Ambulatory Visit: Payer: Self-pay | Admitting: Obstetrics and Gynecology

## 2012-03-30 ENCOUNTER — Ambulatory Visit (INDEPENDENT_AMBULATORY_CARE_PROVIDER_SITE_OTHER): Payer: BC Managed Care – PPO | Admitting: Obstetrics and Gynecology

## 2012-03-30 ENCOUNTER — Encounter: Payer: Self-pay | Admitting: Obstetrics and Gynecology

## 2012-03-30 VITALS — BP 118/66 | Ht 65.0 in | Wt 203.0 lb

## 2012-03-30 DIAGNOSIS — N920 Excessive and frequent menstruation with regular cycle: Secondary | ICD-10-CM | POA: Insufficient documentation

## 2012-03-30 DIAGNOSIS — Z01419 Encounter for gynecological examination (general) (routine) without abnormal findings: Secondary | ICD-10-CM

## 2012-03-30 DIAGNOSIS — Z1231 Encounter for screening mammogram for malignant neoplasm of breast: Secondary | ICD-10-CM

## 2012-03-30 DIAGNOSIS — Z124 Encounter for screening for malignant neoplasm of cervix: Secondary | ICD-10-CM

## 2012-03-30 DIAGNOSIS — E7800 Pure hypercholesterolemia, unspecified: Secondary | ICD-10-CM

## 2012-03-30 NOTE — Progress Notes (Signed)
The patient reports:no complaints  Contraception: husband with vasectomy  Last mammogram: approximate date 03/25/2011 and was normal  Last pap: approximate date 03/26/2011 and was normal   GC/Chlamydia cultures offered: declined HIV/RPR/HbsAg offered:  declined HSV 1 and 2 glycoprotein offered: declined  Menstrual cycle regular and monthly: Yes Menstrual flow normal: Yes  Urinary symptoms: none Normal bowel movements: Yes Reports abuse at home: No:    Subjective:    Debbie [name] is a 46 y.o. female, [phone], who presents for an annual exam. S/P Novasure 08/2011 AVS    History   Social History  . Marital Status: Married    Spouse Name: N/A    Number of Children: N/A  . Years of Education: N/A   Social History Main Topics  . Smoking status: Never Smoker   . Smokeless tobacco: Never Used  . Alcohol Use: No  . Drug Use: No  . Sexually Active: Yes -- Female partner(s)    Birth Control/ Protection: Other-see comments     SPOUSE HAD A VASEC.   Other Topics Concern  . None   Social History Narrative  . None    Menstrual cycle:   LMP: No LMP recorded.           Cycle: monthly, normal flow since ablation  The following portions of the patient's history were reviewed and updated as appropriate: allergies, current medications, past family history, past medical history, past social history, past surgical history and problem list.  Review of Systems Pertinent items are noted in HPI. Breast:Negative for breast lump,nipple discharge or nipple retraction Gastrointestinal: Negative for abdominal pain, change in bowel habits or rectal bleeding Urinary:negative   Objective:    There were no vitals taken for this visit.    Weight:  Wt Readings from Last 1 Encounters:  10/08/11 200 lb (90.719 kg)          BMI: There is no height or weight on file to calculate BMI.  General Appearance: Alert, appropriate appearance for age. No acute distress HEENT: Grossly normal Neck /  Thyroid: Supple, no masses, nodes or enlargement Lungs: clear to auscultation bilaterally Back: No CVA tenderness Breast Exam: No masses or nodes.No dimpling, nipple retraction or discharge. Cardiovascular: Regular rate and rhythm. S1, S2, no murmur Gastrointestinal: Soft, non-tender, no masses or organomegaly Pelvic Exam: Vulva and vagina appear normal. Bimanual exam reveals normal uterus and adnexa. Rectovaginal: normal rectal, no masses Lymphatic Exam: Non-palpable nodes in neck, clavicular, axillary, or inguinal regions Skin: no rash or abnormalities Neurologic: Normal gait and speech, no tremor  Psychiatric: Alert and oriented, appropriate affect.     Assessment:    Normal gyn exam    Plan:    mammogram pap smear return annually or prn STD screening: declined Contraception:vasectomy   Silverio Lay MD

## 2012-04-01 LAB — PAP IG W/ RFLX HPV ASCU

## 2012-05-06 ENCOUNTER — Ambulatory Visit
Admission: RE | Admit: 2012-05-06 | Discharge: 2012-05-06 | Disposition: A | Discharge status: Home / Self Care | Payer: BC Managed Care – PPO | Source: Ambulatory Visit | Attending: Obstetrics and Gynecology | Admitting: Obstetrics and Gynecology

## 2012-05-06 DIAGNOSIS — Z1231 Encounter for screening mammogram for malignant neoplasm of breast: Secondary | ICD-10-CM

## 2012-05-11 ENCOUNTER — Encounter: Payer: Self-pay | Admitting: Obstetrics and Gynecology

## 2012-05-11 NOTE — Progress Notes (Signed)
Quick Note:  Please send "Dense breast" letter to patient and document in chart when letter is sent. ______ 

## 2012-09-02 ENCOUNTER — Other Ambulatory Visit: Payer: Self-pay | Admitting: Neurology

## 2013-03-28 ENCOUNTER — Other Ambulatory Visit: Payer: Self-pay

## 2013-03-28 DIAGNOSIS — Z1231 Encounter for screening mammogram for malignant neoplasm of breast: Secondary | ICD-10-CM

## 2013-05-08 ENCOUNTER — Ambulatory Visit: Payer: BC Managed Care – PPO

## 2013-05-09 ENCOUNTER — Ambulatory Visit: Payer: BC Managed Care – PPO

## 2013-05-11 ENCOUNTER — Other Ambulatory Visit: Payer: Self-pay | Admitting: Gastroenterology

## 2013-05-17 ENCOUNTER — Ambulatory Visit: Payer: BC Managed Care – PPO

## 2013-06-01 DIAGNOSIS — C50919 Malignant neoplasm of unspecified site of unspecified female breast: Secondary | ICD-10-CM

## 2013-06-01 HISTORY — PX: BREAST SURGERY: SHX581

## 2013-06-01 HISTORY — DX: Malignant neoplasm of unspecified site of unspecified female breast: C50.919

## 2013-06-19 ENCOUNTER — Ambulatory Visit
Admission: RE | Admit: 2013-06-19 | Discharge: 2013-06-19 | Disposition: A | Discharge status: Home / Self Care | Payer: Self-pay | Source: Ambulatory Visit

## 2013-06-19 DIAGNOSIS — Z1231 Encounter for screening mammogram for malignant neoplasm of breast: Secondary | ICD-10-CM

## 2013-06-21 ENCOUNTER — Other Ambulatory Visit: Payer: Self-pay | Admitting: Obstetrics and Gynecology

## 2013-06-21 DIAGNOSIS — R928 Other abnormal and inconclusive findings on diagnostic imaging of breast: Secondary | ICD-10-CM

## 2013-06-30 ENCOUNTER — Other Ambulatory Visit: Payer: Self-pay | Admitting: Obstetrics and Gynecology

## 2013-06-30 ENCOUNTER — Ambulatory Visit
Admission: RE | Admit: 2013-06-30 | Discharge: 2013-06-30 | Disposition: A | Discharge status: Home / Self Care | Payer: BC Managed Care – PPO | Source: Ambulatory Visit | Attending: Obstetrics and Gynecology | Admitting: Obstetrics and Gynecology

## 2013-06-30 DIAGNOSIS — R2230 Localized swelling, mass and lump, unspecified upper limb: Secondary | ICD-10-CM

## 2013-06-30 DIAGNOSIS — R928 Other abnormal and inconclusive findings on diagnostic imaging of breast: Secondary | ICD-10-CM

## 2013-06-30 DIAGNOSIS — N6320 Unspecified lump in the left breast, unspecified quadrant: Secondary | ICD-10-CM

## 2013-07-03 ENCOUNTER — Telehealth: Payer: Self-pay | Admitting: *Deleted

## 2013-07-03 ENCOUNTER — Ambulatory Visit
Admission: RE | Admit: 2013-07-03 | Discharge: 2013-07-03 | Disposition: A | Discharge status: Home / Self Care | Payer: BC Managed Care – PPO | Source: Ambulatory Visit | Attending: Obstetrics and Gynecology | Admitting: Obstetrics and Gynecology

## 2013-07-03 ENCOUNTER — Other Ambulatory Visit: Payer: Self-pay | Admitting: Obstetrics and Gynecology

## 2013-07-03 DIAGNOSIS — C50919 Malignant neoplasm of unspecified site of unspecified female breast: Secondary | ICD-10-CM

## 2013-07-03 DIAGNOSIS — N6320 Unspecified lump in the left breast, unspecified quadrant: Secondary | ICD-10-CM

## 2013-07-03 NOTE — Telephone Encounter (Signed)
Leigh at [facility] called stating their fax is not working and she has this pt that needs to be scheduled for the [facility] on 07/12/13.  I copied a snap shot and placed all of the informative information from [facility] on it and placed in Keisha's box for follow up.

## 2013-07-04 ENCOUNTER — Telehealth: Payer: Self-pay | Admitting: *Deleted

## 2013-07-04 DIAGNOSIS — C50312 Malignant neoplasm of lower-inner quadrant of left female breast: Secondary | ICD-10-CM

## 2013-07-04 DIAGNOSIS — Z170 Estrogen receptor positive status [ER+]: Secondary | ICD-10-CM

## 2013-07-04 NOTE — Telephone Encounter (Signed)
Confirmed [facility] for 07/12/13 at 12N .  Instructions and contact information given. 

## 2013-07-07 ENCOUNTER — Ambulatory Visit
Admission: RE | Admit: 2013-07-07 | Discharge: 2013-07-07 | Disposition: A | Discharge status: Home / Self Care | Payer: BC Managed Care – PPO | Source: Ambulatory Visit | Attending: Obstetrics and Gynecology | Admitting: Obstetrics and Gynecology

## 2013-07-07 DIAGNOSIS — C50919 Malignant neoplasm of unspecified site of unspecified female breast: Secondary | ICD-10-CM

## 2013-07-07 MED ORDER — GADOBENATE DIMEGLUMINE 529 MG/ML IV SOLN
18.0000 mL | Freq: Once | INTRAVENOUS | Status: AC | PRN
  Administered 2013-07-07: 18 mL via INTRAVENOUS

## 2013-07-12 ENCOUNTER — Encounter (INDEPENDENT_AMBULATORY_CARE_PROVIDER_SITE_OTHER): Payer: Self-pay

## 2013-07-12 ENCOUNTER — Ambulatory Visit
Admission: RE | Admit: 2013-07-12 | Discharge: 2013-07-12 | Disposition: A | Discharge status: Home / Self Care | Payer: BC Managed Care – PPO | Source: Ambulatory Visit | Attending: Radiation Oncology | Admitting: Radiation Oncology

## 2013-07-12 ENCOUNTER — Telehealth: Payer: Self-pay | Admitting: *Deleted

## 2013-07-12 ENCOUNTER — Other Ambulatory Visit: Payer: Self-pay | Admitting: *Deleted

## 2013-07-12 ENCOUNTER — Encounter: Payer: Self-pay | Admitting: Oncology

## 2013-07-12 ENCOUNTER — Ambulatory Visit: Payer: BC Managed Care – PPO | Attending: General Surgery | Admitting: Physical Therapy

## 2013-07-12 ENCOUNTER — Ambulatory Visit (HOSPITAL_BASED_OUTPATIENT_CLINIC_OR_DEPARTMENT_OTHER): Payer: BC Managed Care – PPO | Admitting: Oncology

## 2013-07-12 ENCOUNTER — Ambulatory Visit: Payer: BC Managed Care – PPO

## 2013-07-12 ENCOUNTER — Ambulatory Visit (HOSPITAL_BASED_OUTPATIENT_CLINIC_OR_DEPARTMENT_OTHER): Payer: BC Managed Care – PPO | Admitting: General Surgery

## 2013-07-12 ENCOUNTER — Other Ambulatory Visit (HOSPITAL_BASED_OUTPATIENT_CLINIC_OR_DEPARTMENT_OTHER): Payer: BC Managed Care – PPO

## 2013-07-12 VITALS — BP 121/74 | HR 90 | Temp 98.8°F | Resp 18 | Ht 64.5 in | Wt 204.4 lb

## 2013-07-12 DIAGNOSIS — E785 Hyperlipidemia, unspecified: Secondary | ICD-10-CM | POA: Insufficient documentation

## 2013-07-12 DIAGNOSIS — C50319 Malignant neoplasm of lower-inner quadrant of unspecified female breast: Secondary | ICD-10-CM

## 2013-07-12 DIAGNOSIS — R293 Abnormal posture: Secondary | ICD-10-CM | POA: Insufficient documentation

## 2013-07-12 DIAGNOSIS — E7800 Pure hypercholesterolemia, unspecified: Secondary | ICD-10-CM

## 2013-07-12 DIAGNOSIS — C50312 Malignant neoplasm of lower-inner quadrant of left female breast: Secondary | ICD-10-CM

## 2013-07-12 DIAGNOSIS — Z170 Estrogen receptor positive status [ER+]: Secondary | ICD-10-CM | POA: Insufficient documentation

## 2013-07-12 DIAGNOSIS — G43909 Migraine, unspecified, not intractable, without status migrainosus: Secondary | ICD-10-CM

## 2013-07-12 DIAGNOSIS — IMO0001 Reserved for inherently not codable concepts without codable children: Secondary | ICD-10-CM | POA: Insufficient documentation

## 2013-07-12 DIAGNOSIS — C50919 Malignant neoplasm of unspecified site of unspecified female breast: Secondary | ICD-10-CM | POA: Insufficient documentation

## 2013-07-12 DIAGNOSIS — I341 Nonrheumatic mitral (valve) prolapse: Secondary | ICD-10-CM

## 2013-07-12 DIAGNOSIS — C773 Secondary and unspecified malignant neoplasm of axilla and upper limb lymph nodes: Secondary | ICD-10-CM

## 2013-07-12 LAB — CBC WITH DIFFERENTIAL/PLATELET
BASO%: 0.6 % (ref 0.0–2.0)
Basophils Absolute: 0.1 10*3/uL (ref 0.0–0.1)
EOS%: 3.9 % (ref 0.0–7.0)
Eosinophils Absolute: 0.4 10*3/uL (ref 0.0–0.5)
HCT: 39.5 % (ref 34.8–46.6)
HGB: 13.4 g/dL (ref 11.6–15.9)
LYMPH%: 23.5 % (ref 14.0–49.7)
MCH: 28.2 pg (ref 25.1–34.0)
MCHC: 33.9 g/dL (ref 31.5–36.0)
MCV: 83.3 fL (ref 79.5–101.0)
MONO#: 0.8 10*3/uL (ref 0.1–0.9)
MONO%: 8.5 % (ref 0.0–14.0)
NEUT#: 6.1 10*3/uL (ref 1.5–6.5)
NEUT%: 63.5 % (ref 38.4–76.8)
Platelets: 335 10*3/uL (ref 145–400)
RBC: 4.74 10*6/uL (ref 3.70–5.45)
RDW: 13.9 % (ref 11.2–14.5)
WBC: 9.7 10*3/uL (ref 3.9–10.3)
lymph#: 2.3 10*3/uL (ref 0.9–3.3)

## 2013-07-12 LAB — COMPREHENSIVE METABOLIC PANEL (CC13)
ALT: 22 U/L (ref 0–55)
AST: 18 U/L (ref 5–34)
Albumin: 4.3 g/dL (ref 3.5–5.0)
Alkaline Phosphatase: 79 U/L (ref 40–150)
Anion Gap: 8 mEq/L (ref 3–11)
BUN: 11 mg/dL (ref 7.0–26.0)
CO2: 25 mEq/L (ref 22–29)
Calcium: 9.9 mg/dL (ref 8.4–10.4)
Chloride: 106 mEq/L (ref 98–109)
Creatinine: 0.7 mg/dL (ref 0.6–1.1)
Glucose: 105 mg/dl (ref 70–140)
Potassium: 4.9 mEq/L (ref 3.5–5.1)
Sodium: 139 mEq/L (ref 136–145)
Total Bilirubin: 0.45 mg/dL (ref 0.20–1.20)
Total Protein: 7.2 g/dL (ref 6.4–8.3)

## 2013-07-12 NOTE — Assessment & Plan Note (Signed)
Pt will get PET scan to eval internal mammary node.    If positive or if other disease is seen, would recommend chemo first.  Would place port a cath.    Pt to decide whether to proceed with neoadjuvant chemotherapy or to proceed with lumpectomy/ALND/port   I also discussed the [employer] [id number] trial of ALND vs axillary radiation.  I reviewed that the purpose of this study is to see if patients have similar recurrence free survival without ALND.  We will refer patient to genetics.  We will offer patient plastics referral to help with her decision making process.    45 min spent in counseling.

## 2013-07-12 NOTE — Progress Notes (Signed)
Radiation Oncology         [phone] ________________________________  Name: Debbie [name] MRN: [id number]  Date: 07/12/2013  DOB: [birth year]  LH:TDSKAJ,GOTLX Viona Gilmore, MD  [name], MD   Lurline Del, MD  REFERRING PHYSICIAN: [name], MD   DIAGNOSIS: The encounter diagnosis was Cancer of lower-inner quadrant of left female breast.   HISTORY OF PRESENT ILLNESS::Debbie [name] is a 48 y.o. female who is seen for an initial consultation visit. The patient is seen in multidisciplinary breast [facility] this afternoon after having her case presented in conference this morning. She was found to have a possible abnormality within the left breast on recent screening mammogram. A palpable abnormality was noted within the lower inner quadrant of the left breast at that time. She underwent mammogram and ultrasound. A 2.3 cm mass was present on ultrasound at the 8:00 position and also a dominant abnormal appearing lymph node with cortical thickening was noted measuring 1.2 cm within the left axilla in short axis diameter. 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 Receptor studies have indicated that the tumor is ER positive, PR positive, and HER-2/neu negative. The Ki-67 staining was 36%.  The patient proceeded to undergo an MRI scan of the breasts bilaterally. And enhancing mass was present at the 8:00 position within the left breast corresponding to the biopsy proven tumor. This measured 2.4 cm on MRI scan. A 2.6 cm enlarged left axillary lymph node was also present in addition to a 0.5 cm left internal mammary node.   PREVIOUS RADIATION THERAPY: No   PAST MEDICAL HISTORY:  has a past medical history of MVP (mitral valve prolapse); IBS (irritable bowel syndrome); Migraines; Hyperlipidemia; MVP (mitral valve prolapse); Migraines; IBS (irritable bowel syndrome); Abnormal Pap smear; Seasonal allergies; Anxiety;  and Menorrhagia.     PAST SURGICAL HISTORY: Past Surgical History  Procedure Laterality Date  . Wisdom teeth removal    . Vulva /perineum biopsy  2011  . Svd       x 3  . Endometrial ablation       FAMILY HISTORY: family history includes Cancer in her father and maternal grandmother; Hyperlipidemia in her mother; Hypertension in her mother.   SOCIAL HISTORY:  reports that she has never smoked. She has never used smokeless tobacco. She reports that she does not drink alcohol or use illicit drugs.   ALLERGIES: Dust mite extract   MEDICATIONS:  Current Outpatient Prescriptions  Medication Sig Dispense Refill  . ALPRAZolam (XANAX) 0.5 MG tablet       . atorvastatin (LIPITOR) 20 MG tablet Take 20 mg by mouth daily.      . cetirizine (ZYRTEC) 10 MG tablet Take 10 mg by mouth daily.      . Cholecalciferol (VITAMIN D) 2000 UNITS tablet Take 4,000 Units by mouth daily.      Marland Kitchen FeFum-FePoly-FA-B Cmp-C-Biot (INTEGRA PLUS) CAPS Take 1 capsule by mouth daily.  12 capsule  0  . FROVA 2.5 MG tablet       . mometasone (NASONEX) 50 MCG/ACT nasal spray Place 2 sprays into the nose daily.      . Multiple Vitamin (MULTIVITAMIN) tablet Take 1 tablet by mouth daily.      Marland Kitchen oxymetazoline (AFRIN) 0.05 % nasal spray Place 2 sprays into the nose daily as needed. congestion      . PARoxetine (PAXIL) 10 MG tablet Take 10 mg by mouth every  morning.      . Vitamin D, Ergocalciferol, (DRISDOL) 50000 UNITS CAPS capsule Take by mouth every 7 (seven) days.        No current facility-administered medications for this encounter.     REVIEW OF SYSTEMS:  A 15 point review of systems is documented in the electronic medical record. This was obtained by the nursing staff. However, I reviewed this with the patient to discuss relevant findings and make appropriate changes.  Pertinent items are noted in HPI.    PHYSICAL EXAM:  vitals were not taken for this visit.  ECOG = 1  0 - Asymptomatic (Fully active, able  to carry on all predisease activities without restriction)  1 - Symptomatic but completely ambulatory (Restricted in physically strenuous activity but ambulatory and able to carry out work of a light or sedentary nature. For example, light housework, office work)  2 - Symptomatic, <50% in bed during the day (Ambulatory and capable of all self care but unable to carry out any work activities. Up and about more than 50% of waking hours)  3 - Symptomatic, >50% in bed, but not bedbound (Capable of only limited self-care, confined to bed or chair 50% or more of waking hours)  4 - Bedbound (Completely disabled. Cannot carry on any self-care. Totally confined to bed or chair)  5 - Death   Eustace Pen MM, Creech RH, Tormey DC, et al. [phone]). "Toxicity and response criteria of the [facility]". [place] Oncol. 5 (6): 649-55  General: Well-developed, in no acute distress HEENT: Normocephalic, atraumatic; oral cavity clear Neck: Supple without any lymphadenopathy Cardiovascular: Regular rate and rhythm Respiratory: Clear to auscultation bilaterally Breasts: a palpable tumor was present at the 8:00 position within the left breast. Left axilla demonstrates a palpable lymph node. No suspicious findings elsewhere within the left breast. Unremarkable right breast exam and negative axilla on the right. GI: Soft, nontender, normal bowel sounds Extremities: No edema present Neuro: No focal deficits     LABORATORY DATA:  Lab Results  Component Value Date   WBC 9.7 07/12/2013   HGB 13.4 07/12/2013   HCT 39.5 07/12/2013   MCV 83.3 07/12/2013   PLT 335 07/12/2013   Lab Results  Component Value Date   NA 139 07/12/2013   K 4.9 07/12/2013   CO2 25 07/12/2013   Lab Results  Component Value Date   ALT 22 07/12/2013   AST 18 07/12/2013   ALKPHOS 79 07/12/2013   BILITOT 0.45 07/12/2013      RADIOGRAPHY: Mr Breast Bilateral W Wo Contrast  07/07/2013   CLINICAL DATA:  Patient with  recently diagnosed left breast carcinoma and metastatic left axillary lymphadenopathy.  EXAM: BILATERAL BREAST MRI WITH AND WITHOUT CONTRAST  TECHNIQUE: Multiplanar, multisequence MR images of both breasts were obtained prior to and following the intravenous administration of 52m of MultiHance.  THREE-DIMENSIONAL MR IMAGE RENDERING ON INDEPENDENT WORKSTATION:  Three-dimensional MR images were rendered by post-processing of the original MR data on an independent workstation. The three-dimensional MR images were interpreted, and findings are reported in the following complete MRI report for this study. Three dimensional images were evaluated at the independent DynaCad workstation  COMPARISON:  Previous exams  FINDINGS: Breast composition: c:  Heterogeneous fibroglandular tissue  Background parenchymal enhancement: Moderate  Right breast: No mass or abnormal enhancement.  Left breast: Within the 8 o'clock position left breast middle depth there is a 2.4 x 2.2 by 2.2 cm enhancing mass containing a biopsy  marking clip compatible with recently biopsy proven left breast carcinoma. No additional concerning areas of enhancement are identified within the left breast.  Lymph nodes: There is a 2.6 cm enlarged left axillary lymph node compatible with metastatic left axillary lymphadenopathy. There is a 0.5 cm left internal mammary lymph node.  Ancillary findings:  None.  IMPRESSION: Enhancing mass within the 8 o'clock position left breast compatible with biopsy-proven carcinoma.  Enlarged left axillary lymph node compatible biopsy proven metastatic adenopathy.  Nonspecific 0.5 cm left internal mammary lymph node. Metastatic disease not excluded.  RECOMMENDATION: Treatment plan for known left breast malignancy.  BI-RADS CATEGORY  6: Known biopsy-proven malignancy - appropriate action should be taken.   Electronically Signed   By: Lovey Newcomer M.D.   On: 07/07/2013 15:48   Mm Digital Diagnostic Unilat L  06/30/2013   CLINICAL  DATA:  Post left breast ultrasound-guided biopsy 8 o'clock location with clip placement.  EXAM: POST-BIOPSY CLIP PLACEMENT left DIAGNOSTIC MAMMOGRAM  COMPARISON:  Previous exams.  FINDINGS: Films are performed following ultrasound guided biopsy of left breast mass 8 o'clock location. The heart shaped clip is appropriately located at the left breast mass 8 o'clock location biopsy site.  IMPRESSION: Appropriate heart shaped clip location, left breast 8 o'clock location.  Final Assessment: Post Procedure Mammograms for Marker Placement   Electronically Signed   By: Conchita Paris M.D.   On: 06/30/2013 16:04   Mm Digital Diagnostic Unilat L  06/30/2013   CLINICAL DATA:  The patient underwent screening mammography on 06/19/2013 and was called back for a possible mass in the left lower inner quadrant. Since that time, the patient performed a breast self-examination and palpates a mass in the left lower inner quadrant.  EXAM: DIGITAL DIAGNOSTIC  left MAMMOGRAM  ULTRASOUND left BREAST  COMPARISON:  Prior exams  ACR Breast Density Category c: The breast tissue is heterogeneously dense, which may obscure small masses.  FINDINGS: Additional views confirm the presence of an irregular spiculated dense mass measuring 2.2 cm in the left lower inner quadrant, corresponding to the screening mammographic finding.  On physical exam, I palpate a mass in the left breast 8 o'clock location corresponding to the patient's questioned palpable finding and area of the mammographic abnormality.  Ultrasound is performed, showing an irregular shadowing hypoechoic mass left breast 8 o'clock location 3 cm from the nipple measuring 2.3 x 2.3 x 1.0 cm. This corresponds to the mammographic and palpable finding. In the left axilla, a dominant abnormal appearing lymph node cortical thickening is identified measuring 1.2 cm in short axis diameter.  IMPRESSION: Suspicious left breast mass 8 o'clock location with abnormal ipsilateral left axillary  lymphadenopathy. Ultrasound-guided core biopsy will be performed of both areas and dictated separately.  RECOMMENDATION: Left breast and axillary core needle biopsy  I have discussed the findings and recommendations with the patient. Results were also provided in writing at the conclusion of the visit. If applicable, a reminder letter will be sent to the patient regarding the next appointment.  BI-RADS CATEGORY  4: Suspicious abnormality - biopsy should be considered.   Electronically Signed   By: Conchita Paris M.D.   On: 06/30/2013 14:59   Mm Digital Screening  06/20/2013   CLINICAL DATA:  Screening.  EXAM: DIGITAL SCREENING BILATERAL MAMMOGRAM WITH CAD  COMPARISON:  Previous exam(s)  ACR Breast Density Category c: The breast tissue is heterogeneously dense, which may obscure small masses.  FINDINGS: In the left breast, a possible mass warrants further evaluation  with spot compression views and possibly ultrasound. In the right breast, no masses or malignant type calcifications are identified. Images were processed with CAD.  IMPRESSION: Further evaluation is suggested for possible mass in the left breast.  RECOMMENDATION: Diagnostic mammogram and possibly ultrasound of the left breast. (Code:FI-L-12M)  The patient will be contacted regarding the findings, and additional imaging will be scheduled.  BI-RADS CATEGORY  0: Incomplete. Need additional imaging evaluation and/or prior mammograms for comparison.   Electronically Signed   By: Abelardo Diesel M.D.   On: 06/20/2013 13:06   Mm Radiologist Eval And Mgmt  07/03/2013   EXAM: ESTABLISHED PATIENT OFFICE VISIT - LEVEL II ([id number])  EXAM: There is ecchymosis in the lower inner quadrant of the left breast. I palpate the breast mass. Biopsy site is clean and dry. In the left axilla, there is minimal ecchymosis without palpable abnormality.  HISTORY OF PRESENT ILLNESS: Over night, patient has done well. She reports bruising at the site of left breast biopsy. She  reports no significant bleeding or pain.  CHIEF COMPLAINT: The patient returns to discuss biopsy results after ultrasound-guided core biopsy of mass in the left breast 8 o'clock location and left axillary lymph node.  PATHOLOGY: Biopsy of the 8 o'clock location shows invasive ductal carcinoma and ductal carcinoma in situ.  Biopsy of the left axilla shows invasive mammary carcinoma with lymphoid inflammation, with differential diagnosis being total replacement of the lymph node by metastatic tumor or metastatic saw tumor deposit within prominent lymphoid inflammation.  ASSESSMENT AND PLAN: ASSESSMENT AND PLAN The patient is scheduled to be seen at the [facility] on 07/12/2013. MRI has been scheduled later this week. She was given Scientist, clinical (histocompatibility and immunogenetics). I discussed the findings and recommendations with the patient and her husband. Questions were answered.   Electronically Signed   By: Shon Hale M.D.   On: 07/03/2013 15:04   US Breast Ltd Uni Left Inc Axilla  06/30/2013   CLINICAL DATA:  The patient underwent screening mammography on 06/19/2013 and was called back for a possible mass in the left lower inner quadrant. Since that time, the patient performed a breast self-examination and palpates a mass in the left lower inner quadrant.  EXAM: DIGITAL DIAGNOSTIC  left MAMMOGRAM  ULTRASOUND left BREAST  COMPARISON:  Prior exams  ACR Breast Density Category c: The breast tissue is heterogeneously dense, which may obscure small masses.  FINDINGS: Additional views confirm the presence of an irregular spiculated dense mass measuring 2.2 cm in the left lower inner quadrant, corresponding to the screening mammographic finding.  On physical exam, I palpate a mass in the left breast 8 o'clock location corresponding to the patient's questioned palpable finding and area of the mammographic abnormality.  Ultrasound is performed, showing an irregular shadowing hypoechoic mass left breast 8 o'clock location 3 cm from  the nipple measuring 2.3 x 2.3 x 1.0 cm. This corresponds to the mammographic and palpable finding. In the left axilla, a dominant abnormal appearing lymph node cortical thickening is identified measuring 1.2 cm in short axis diameter.  IMPRESSION: Suspicious left breast mass 8 o'clock location with abnormal ipsilateral left axillary lymphadenopathy. Ultrasound-guided core biopsy will be performed of both areas and dictated separately.  RECOMMENDATION: Left breast and axillary core needle biopsy  I have discussed the findings and recommendations with the patient. Results were also provided in writing at the conclusion of the visit. If applicable, a reminder letter will be sent to the patient regarding the next appointment.  BI-RADS CATEGORY  4: Suspicious abnormality - biopsy should be considered.   Electronically Signed   By: Conchita Paris M.D.   On: 06/30/2013 14:59   [place] Lt Breast Bx W Loc Dev 1st Lesion Img Bx Spec US Guide  06/30/2013   CLINICAL DATA:  Suspicious left breast mass 8 o'clock location and left axillary lymphadenopathy  EXAM: ULTRASOUND GUIDED left BREAST CORE NEEDLE BIOPSY WITH VACUUM ASSIST  COMPARISON:  Previous exams.  PROCEDURE: I met with the patient and we discussed the procedure of ultrasound-guided biopsy, including benefits and alternatives. We discussed the high likelihood of a successful procedure. We discussed the risks of the procedure including infection, bleeding, tissue injury, clip migration, and inadequate sampling. Informed written consent was given. The usual time-out protocol was performed immediately prior to the procedure.  Using sterile technique and 2% Lidocaine as local anesthetic, under direct ultrasound visualization, a 12 gauge vacuum-assisteddevice was used to perform biopsy of the left breast mass 8 o'clock location using a medial to lateral approach. At the conclusion of the procedure, a heart shaped tissue marker clip was deployed into the biopsy cavity.  Follow-up 2-view mammogram was performed and dictated separately.  IMPRESSION: Ultrasound-guided biopsy of left breast mass 8 o'clock location with heart shaped clip marker placement. Left axillary lymph node was subsequently biopsied and will be dictated under a separate report. No apparent complications.   Electronically Signed   By: Conchita Paris M.D.   On: 06/30/2013 15:00   [place] Lt Breast Bx W Loc Dev Ea Add Lesion Img Bx Spec US Guide  06/30/2013   CLINICAL DATA:  Left axillary abnormal lymph node and suspicious left breast mass 8 o'clock location.  EXAM: ULTRASOUND GUIDED CORE NEEDLE BIOPSY OF A left AXILLARY NODE  COMPARISON:  Previous exams.  FINDINGS: I met with the patient and we discussed the procedure of ultrasound-guided biopsy, including benefits and alternatives. We discussed the high likelihood of a successful procedure. We discussed the risks of the procedure, including infection, bleeding, tissue injury, clip migration, and inadequate sampling. Informed written consent was given. The usual time-out protocol was performed immediately prior to the procedure.  Using sterile technique and 2% Lidocaine as local anesthetic, under direct ultrasound visualization, a 12 gauge vacuum assisted device was used to perform biopsy of abnormal left axillary lymph node using a lateral to medial approach. At the conclusion of the procedure a clip was not deployed into the sampled node.  IMPRESSION: Ultrasound guided biopsy of abnormal left axillary lymph node without clip placement. Pathology is pending. No apparent complications. Left breast mass 8 o'clock location core biopsy was performed today and dictated separately.   Electronically Signed   By: Conchita Paris M.D.   On: 06/30/2013 15:16       IMPRESSION: The patient has a recent diagnosis of invasive ductal carcinoma of the left breast. Clinically this corresponds to a T2, N1, M0 tumor, at minimum. A PET scan is being ordered and we will further  evaluate the staging of the patient, including the potential left internal mammary lymph node.  The patient's appears to be a good candidate for breast conservation treatment. She may benefit from neoadjuvant treatment and this is being further discussed with the patient today by surgical oncology and medical oncology. Her eligibility for possible trials may also be affected by further staging.  The patient will benefit from adjuvant radiation treatment to improve local/regional control after surgery and chemotherapy have been completed. I discussed with her this  potential benefit as well as the possible side effects and risks of treatment. All of her questions were answered. The patient was given contact information if she has any further questions prior to her next visit.   PLAN:  I look forward to seeing the patient postoperatively to review her information at that time and to evaluate her for an anticipated course of adjuvant radiation treatment. We will further coordinate her care at that time.   I spent 60 minutes face to face with the patient and more than 50% of that time was spent in counseling and/or coordination of care.    ________________________________   Jodelle Gross, MD, PhD

## 2013-07-12 NOTE — Progress Notes (Signed)
Chief complaint:  New left breast cancer   HISTORY: Pt is a 48 yo F who presents with a palpable mass found when she went for her screening mammogram.  She denies any prior breast biopsies.  She breast fed 3 children.  Menarche at age 58.  She is still having periods.  She was 33 at first child's birth.  She used hormonal birth control for 3.5 years.  She is up to date on her colonoscopy.  She had first mammogram at age 34.  She has had some breast pain since MRI.  She has no family history of breast cancer.  Her dad had multiple myeloma, and her maternal grandmother had uterine cancer.    Past Medical History  Diagnosis Date  . MVP (mitral valve prolapse)   . IBS (irritable bowel syndrome)   . Migraines     menstral  . Hyperlipidemia   . MVP (mitral valve prolapse)   . Migraines   . IBS (irritable bowel syndrome)   . Abnormal Pap smear   . Seasonal allergies   . Anxiety   . Menorrhagia     Past Surgical History  Procedure Laterality Date  . Wisdom teeth removal    . Vulva /perineum biopsy  2011  . Svd       x 3  . Endometrial ablation      Current Outpatient Prescriptions  Medication Sig Dispense Refill  . ALPRAZolam (XANAX) 0.5 MG tablet       . atorvastatin (LIPITOR) 20 MG tablet Take 20 mg by mouth daily.      . cetirizine (ZYRTEC) 10 MG tablet Take 10 mg by mouth daily.      . Cholecalciferol (VITAMIN D) 2000 UNITS tablet Take 4,000 Units by mouth daily.      Marland Kitchen FeFum-FePoly-FA-B Cmp-C-Biot (INTEGRA PLUS) CAPS Take 1 capsule by mouth daily.  12 capsule  0  . FROVA 2.5 MG tablet       . mometasone (NASONEX) 50 MCG/ACT nasal spray Place 2 sprays into the nose daily.      . Multiple Vitamin (MULTIVITAMIN) tablet Take 1 tablet by mouth daily.      Marland Kitchen oxymetazoline (AFRIN) 0.05 % nasal spray Place 2 sprays into the nose daily as needed. congestion      . PARoxetine (PAXIL) 10 MG tablet Take 10 mg by mouth every morning.      . Vitamin D, Ergocalciferol, (DRISDOL) 50000 UNITS  CAPS capsule Take by mouth every 7 (seven) days.        No current facility-administered medications for this visit.     Allergies  Allergen Reactions  . Dust Mite Extract Itching     Family History  Problem Relation Age of Onset  . Cancer Maternal Grandmother     BREAST AND UTERINE CANCER  . Hyperlipidemia Mother   . Hypertension Mother   . Cancer Father     multiple myeloma     History   Social History  . Marital Status: Married    Spouse Name: N/A    Number of Children: N/A  . Years of Education: N/A   Social History Main Topics  . Smoking status: Never Smoker   . Smokeless tobacco: Never Used  . Alcohol Use: No  . Drug Use: No  . Sexual Activity: Yes    Partners: Male    Birth Control/ Protection: Other-see comments     Comment: SPOUSE HAD A VASEC.     REVIEW OF SYSTEMS - PERTINENT  POSITIVES ONLY: 12 point review of systems negative other than HPI and PMH except for night sweats, fatigue, sore throat, sinus issues, heartburn. Headaches, anxiety  EXAM: There were no vitals filed for this visit.  Wt Readings from Last 3 Encounters:  07/12/13 204 lb 6.4 oz (92.715 kg)  03/30/12 203 lb (92.08 kg)  10/08/11 200 lb (90.719 kg)     Gen:  No acute distress.  Well nourished and well groomed.   Neurological: Alert and oriented to person, place, and time. Coordination normal.  Head: Normocephalic and atraumatic.  Eyes: Conjunctivae are normal. Pupils are equal, round, and reactive to light. No scleral icterus.  Neck: Normal range of motion. Neck supple. No tracheal deviation or thyromegaly present.  Cardiovascular: Normal rate, regular rhythm, normal heart sounds and intact distal pulses.  Exam reveals no gallop and no friction rub.  No murmur heard. Breast: palpable mass in lower inner quadrant around 3 cm.  Palpable LN in left axilla.  No other masses.  No masses on right breast. No nipple retraction of skin dimpling.   Respiratory: Effort normal.  No  respiratory distress. No chest wall tenderness. Breath sounds normal.  No wheezes, rales or rhonchi.  GI: Soft. Bowel sounds are normal. The abdomen is soft and nontender.  There is no rebound and no guarding.  Musculoskeletal:  Extremities are nontender.  Lymphadenopathy: No cervical, preauricular, postauricular, supraclavicular or infraclavicular adenopathy is present Skin: Skin is warm and dry. No rash noted. No diaphoresis. No erythema. No pallor. No clubbing, cyanosis, or edema.   Psychiatric: Normal mood and affect. Behavior is normal. Judgment and thought content normal.    LABORATORY RESULTS: Available labs are reviewed  Pathology 1. 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 2. Lymph node, needle/core biopsy, left axilla - INVASIVE MAMMARY CARCINOMA WITH LYMPHOID INFLAMMATION, SEE COMMENT. Hormone receptors positive, Ki67 36%, Her 2 not overexpressed.     Recent Results (from the past 2160 hour(s))  CBC WITH DIFFERENTIAL     Status: None   Collection Time    07/12/13 12:29 PM      Result Value Ref Range   WBC 9.7  3.9 - 10.3 10e3/uL   NEUT# 6.1  1.5 - 6.5 10e3/uL   HGB 13.4  11.6 - 15.9 g/dL   HCT 39.5  34.8 - 46.6 %   Platelets 335  145 - 400 10e3/uL   MCV 83.3  79.5 - 101.0 fL   MCH 28.2  25.1 - 34.0 pg   MCHC 33.9  31.5 - 36.0 g/dL   RBC 4.74  3.70 - 5.45 10e6/uL   RDW 13.9  11.2 - 14.5 %   lymph# 2.3  0.9 - 3.3 10e3/uL   MONO# 0.8  0.1 - 0.9 10e3/uL   Eosinophils Absolute 0.4  0.0 - 0.5 10e3/uL   Basophils Absolute 0.1  0.0 - 0.1 10e3/uL   NEUT% 63.5  38.4 - 76.8 %   LYMPH% 23.5  14.0 - 49.7 %   MONO% 8.5  0.0 - 14.0 %   EOS% 3.9  0.0 - 7.0 %   BASO% 0.6  0.0 - 2.0 %  COMPREHENSIVE METABOLIC PANEL (ZT24)     Status: None   Collection Time    07/12/13 12:29 PM      Result Value Ref Range   Sodium 139  136 - 145 mEq/L   Potassium 4.9  3.5 - 5.1 mEq/L   Chloride  106  98 - 109 mEq/L    CO2 25  22 - 29 mEq/L   Glucose 105  70 - 140 mg/dl   BUN 11.0  7.0 - 26.0 mg/dL   Creatinine 0.7  0.6 - 1.1 mg/dL   Total Bilirubin 0.45  0.20 - 1.20 mg/dL   Alkaline Phosphatase 79  40 - 150 U/L   AST 18  5 - 34 U/L   ALT 22  0 - 55 U/L   Total Protein 7.2  6.4 - 8.3 g/dL   Albumin 4.3  3.5 - 5.0 g/dL   Calcium 9.9  8.4 - 10.4 mg/dL   Anion Gap 8  3 - 11 mEq/L     RADIOLOGY RESULTS: See E-Chart or I-Site for most recent results.  Images and reports are reviewed.  Mr Breast Bilateral W Wo Contrast  07/07/2013   CLINICAL DATA:  Patient with recently diagnosed left breast carcinoma and metastatic left axillary lymphadenopathy.  EXAM: BILATERAL BREAST MRI WITH AND WITHOUT CONTRAST  TECHNIQUE: Multiplanar, multisequence MR images of both breasts were obtained prior to and following the intravenous administration of 39m of MultiHance.  THREE-DIMENSIONAL MR IMAGE RENDERING ON INDEPENDENT WORKSTATION:  Three-dimensional MR images were rendered by post-processing of the original MR data on an independent workstation. The three-dimensional MR images were interpreted, and findings are reported in the following complete MRI report for this study. Three dimensional images were evaluated at the independent DynaCad workstation  COMPARISON:  Previous exams  FINDINGS: Breast composition: c:  Heterogeneous fibroglandular tissue  Background parenchymal enhancement: Moderate  Right breast: No mass or abnormal enhancement.  Left breast: Within the 8 o'clock position left breast middle depth there is a 2.4 x 2.2 by 2.2 cm enhancing mass containing a biopsy marking clip compatible with recently biopsy proven left breast carcinoma. No additional concerning areas of enhancement are identified within the left breast.  Lymph nodes: There is a 2.6 cm enlarged left axillary lymph node compatible with metastatic left axillary lymphadenopathy. There is a 0.5 cm left internal mammary lymph node.  Ancillary findings:  None.   IMPRESSION: Enhancing mass within the 8 o'clock position left breast compatible with biopsy-proven carcinoma.  Enlarged left axillary lymph node compatible biopsy proven metastatic adenopathy.  Nonspecific 0.5 cm left internal mammary lymph node. Metastatic disease not excluded.  RECOMMENDATION: Treatment plan for known left breast malignancy.  BI-RADS CATEGORY  6: Known biopsy-proven malignancy - appropriate action should be taken.   Electronically Signed   By: DLovey NewcomerM.D.   On: 07/07/2013 15:48   Mm Digital Diagnostic Unilat L  06/30/2013   CLINICAL DATA:  Post left breast ultrasound-guided biopsy 8 o'clock location with clip placement.  EXAM: POST-BIOPSY CLIP PLACEMENT left DIAGNOSTIC MAMMOGRAM  COMPARISON:  Previous exams.  FINDINGS: Films are performed following ultrasound guided biopsy of left breast mass 8 o'clock location. The heart shaped clip is appropriately located at the left breast mass 8 o'clock location biopsy site.  IMPRESSION: Appropriate heart shaped clip location, left breast 8 o'clock location.  Final Assessment: Post Procedure Mammograms for Marker Placement   Electronically Signed   By: GConchita ParisM.D.   On: 06/30/2013 16:04   Mm Digital Diagnostic Unilat L  06/30/2013   CLINICAL DATA:  The patient underwent screening mammography on 06/19/2013 and was called back for a possible mass in the left lower inner quadrant. Since that time, the patient performed a breast self-examination and palpates a mass in the left lower inner quadrant.  EXAM: DIGITAL DIAGNOSTIC  left MAMMOGRAM  ULTRASOUND left BREAST  COMPARISON:  Prior exams  ACR Breast Density Category c: The breast tissue is heterogeneously dense, which may obscure small masses.  FINDINGS: Additional views confirm the presence of an irregular spiculated dense mass measuring 2.2 cm in the left lower inner quadrant, corresponding to the screening mammographic finding.  On physical exam, I palpate a mass in the left breast 8  o'clock location corresponding to the patient's questioned palpable finding and area of the mammographic abnormality.  Ultrasound is performed, showing an irregular shadowing hypoechoic mass left breast 8 o'clock location 3 cm from the nipple measuring 2.3 x 2.3 x 1.0 cm. This corresponds to the mammographic and palpable finding. In the left axilla, a dominant abnormal appearing lymph node cortical thickening is identified measuring 1.2 cm in short axis diameter.  IMPRESSION: Suspicious left breast mass 8 o'clock location with abnormal ipsilateral left axillary lymphadenopathy. Ultrasound-guided core biopsy will be performed of both areas and dictated separately.  RECOMMENDATION: Left breast and axillary core needle biopsy  I have discussed the findings and recommendations with the patient. Results were also provided in writing at the conclusion of the visit. If applicable, a reminder letter will be sent to the patient regarding the next appointment.  BI-RADS CATEGORY  4: Suspicious abnormality - biopsy should be considered.   Electronically Signed   By: Conchita Paris M.D.   On: 06/30/2013 14:59   Mm Digital Screening  06/20/2013   CLINICAL DATA:  Screening.  EXAM: DIGITAL SCREENING BILATERAL MAMMOGRAM WITH CAD  COMPARISON:  Previous exam(s)  ACR Breast Density Category c: The breast tissue is heterogeneously dense, which may obscure small masses.  FINDINGS: In the left breast, a possible mass warrants further evaluation with spot compression views and possibly ultrasound. In the right breast, no masses or malignant type calcifications are identified. Images were processed with CAD.  IMPRESSION: Further evaluation is suggested for possible mass in the left breast.  RECOMMENDATION: Diagnostic mammogram and possibly ultrasound of the left breast. (Code:FI-L-9M)  The patient will be contacted regarding the findings, and additional imaging will be scheduled.  BI-RADS CATEGORY  0: Incomplete. Need additional  imaging evaluation and/or prior mammograms for comparison.   Electronically Signed   By: Abelardo Diesel M.D.   On: 06/20/2013 13:06   Mm Radiologist Eval And Mgmt  07/03/2013   EXAM: ESTABLISHED PATIENT OFFICE VISIT - LEVEL II ([id number])  EXAM: There is ecchymosis in the lower inner quadrant of the left breast. I palpate the breast mass. Biopsy site is clean and dry. In the left axilla, there is minimal ecchymosis without palpable abnormality.  HISTORY OF PRESENT ILLNESS: Over night, patient has done well. She reports bruising at the site of left breast biopsy. She reports no significant bleeding or pain.  CHIEF COMPLAINT: The patient returns to discuss biopsy results after ultrasound-guided core biopsy of mass in the left breast 8 o'clock location and left axillary lymph node.  PATHOLOGY: Biopsy of the 8 o'clock location shows invasive ductal carcinoma and ductal carcinoma in situ.  Biopsy of the left axilla shows invasive mammary carcinoma with lymphoid inflammation, with differential diagnosis being total replacement of the lymph node by metastatic tumor or metastatic saw tumor deposit within prominent lymphoid inflammation.  ASSESSMENT AND PLAN: ASSESSMENT AND PLAN The patient is scheduled to be seen at [facility] on 07/12/2013. MRI has been scheduled later this week. She was given Scientist, clinical (histocompatibility and immunogenetics). I discussed the findings and recommendations  with the patient and her husband. Questions were answered.   Electronically Signed   By: Shon Hale M.D.   On: 07/03/2013 15:04   US Breast Ltd Uni Left Inc Axilla  06/30/2013   CLINICAL DATA:  The patient underwent screening mammography on 06/19/2013 and was called back for a possible mass in the left lower inner quadrant. Since that time, the patient performed a breast self-examination and palpates a mass in the left lower inner quadrant.  EXAM: DIGITAL DIAGNOSTIC  left MAMMOGRAM  ULTRASOUND left BREAST  COMPARISON:  Prior exams  ACR Breast  Density Category c: The breast tissue is heterogeneously dense, which may obscure small masses.  FINDINGS: Additional views confirm the presence of an irregular spiculated dense mass measuring 2.2 cm in the left lower inner quadrant, corresponding to the screening mammographic finding.  On physical exam, I palpate a mass in the left breast 8 o'clock location corresponding to the patient's questioned palpable finding and area of the mammographic abnormality.  Ultrasound is performed, showing an irregular shadowing hypoechoic mass left breast 8 o'clock location 3 cm from the nipple measuring 2.3 x 2.3 x 1.0 cm. This corresponds to the mammographic and palpable finding. In the left axilla, a dominant abnormal appearing lymph node cortical thickening is identified measuring 1.2 cm in short axis diameter.  IMPRESSION: Suspicious left breast mass 8 o'clock location with abnormal ipsilateral left axillary lymphadenopathy. Ultrasound-guided core biopsy will be performed of both areas and dictated separately.  RECOMMENDATION: Left breast and axillary core needle biopsy  I have discussed the findings and recommendations with the patient. Results were also provided in writing at the conclusion of the visit. If applicable, a reminder letter will be sent to the patient regarding the next appointment.  BI-RADS CATEGORY  4: Suspicious abnormality - biopsy should be considered.   Electronically Signed   By: Conchita Paris M.D.   On: 06/30/2013 14:59   [place] Lt Breast Bx W Loc Dev 1st Lesion Img Bx Spec US Guide  06/30/2013   CLINICAL DATA:  Suspicious left breast mass 8 o'clock location and left axillary lymphadenopathy  EXAM: ULTRASOUND GUIDED left BREAST CORE NEEDLE BIOPSY WITH VACUUM ASSIST  COMPARISON:  Previous exams.  PROCEDURE: I met with the patient and we discussed the procedure of ultrasound-guided biopsy, including benefits and alternatives. We discussed the high likelihood of a successful procedure. We discussed the  risks of the procedure including infection, bleeding, tissue injury, clip migration, and inadequate sampling. Informed written consent was given. The usual time-out protocol was performed immediately prior to the procedure.  Using sterile technique and 2% Lidocaine as local anesthetic, under direct ultrasound visualization, a 12 gauge vacuum-assisteddevice was used to perform biopsy of the left breast mass 8 o'clock location using a medial to lateral approach. At the conclusion of the procedure, a heart shaped tissue marker clip was deployed into the biopsy cavity. Follow-up 2-view mammogram was performed and dictated separately.  IMPRESSION: Ultrasound-guided biopsy of left breast mass 8 o'clock location with heart shaped clip marker placement. Left axillary lymph node was subsequently biopsied and will be dictated under a separate report. No apparent complications.   Electronically Signed   By: Conchita Paris M.D.   On: 06/30/2013 15:00   [place] Lt Breast Bx W Loc Dev Ea Add Lesion Img Bx Spec US Guide  06/30/2013   CLINICAL DATA:  Left axillary abnormal lymph node and suspicious left breast mass 8 o'clock location.  EXAM: ULTRASOUND GUIDED CORE NEEDLE BIOPSY  OF A left AXILLARY NODE  COMPARISON:  Previous exams.  FINDINGS: I met with the patient and we discussed the procedure of ultrasound-guided biopsy, including benefits and alternatives. We discussed the high likelihood of a successful procedure. We discussed the risks of the procedure, including infection, bleeding, tissue injury, clip migration, and inadequate sampling. Informed written consent was given. The usual time-out protocol was performed immediately prior to the procedure.  Using sterile technique and 2% Lidocaine as local anesthetic, under direct ultrasound visualization, a 12 gauge vacuum assisted device was used to perform biopsy of abnormal left axillary lymph node using a lateral to medial approach. At the conclusion of the procedure a clip was  not deployed into the sampled node.  IMPRESSION: Ultrasound guided biopsy of abnormal left axillary lymph node without clip placement. Pathology is pending. No apparent complications. Left breast mass 8 o'clock location core biopsy was performed today and dictated separately.   Electronically Signed   By: Conchita Paris M.D.   On: 06/30/2013 15:16      ASSESSMENT AND PLAN: Cancer of lower-inner quadrant of left female breast Pt will get PET scan to eval internal mammary node.    If positive or if other disease is seen, would recommend chemo first.  Would place port a cath.    Pt to decide whether to proceed with neoadjuvant chemotherapy or to proceed with lumpectomy/ALND/port   I also discussed the [employer] [id number] trial of ALND vs axillary radiation.  I reviewed that the purpose of this study is to see if patients have similar recurrence free survival without ALND.  We will refer patient to genetics.  We will offer patient plastics referral to help with her decision making process.    45 min spent in counseling.       Milus Height MD Surgical Oncology, General and [facility]      Visit Diagnoses: 1. Cancer of lower-inner quadrant of left female breast     Primary Care Physician: Gara Kroner, MD  MOODY, John - rad onc MAGRINAT, Gus - med onc

## 2013-07-12 NOTE — Progress Notes (Signed)
Checked in new pt with no financial concerns. °

## 2013-07-12 NOTE — Progress Notes (Signed)
[place]  [phone]) [phone] Fax:(336) [phone]     ID: Debbie [name] OB: June 11, 1965  MR#: [id number]  MBW#:[id number]  PCP: Gara Kroner, MD GYN:  [name]: [name] OTHER MD: [name], Sharyn Creamer  CHIEF COMPLAINT: " I had a mammogram and they found a mass".  HISTORY OF PRESENT ILLNESS: Debbie [name] had routine yearly screening mammography at the breast center 06/19/2013 showing a possible mass in the left breast. The right breast was unremarkable. On 06/30/2013 additional views of the left breast showed an irregular spiculated dense mass measuring 2.2 cm in the lower inner quadrant. This was palpable at the 8:00 location. Ultrasound showed an irregular hypoechoic mass measuring 2.3 cm and in the left axilla a dominant abnormal appearing lymph node with cortical thickening measuring 1.2 cm.  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  [id number] the patient underwent bilateral breast MRI. This showed her breasts to be dense (composition C.). In the left breast at the 8:00 position there was a 2.4 cm enhancing mass containing a biopsy clip. There were no additional areas of concern. In the axilla on the left there was a 2.6 cm enlarged left axillary lymph node. There was also a 0.5 cm left internal mammary lymph nodes noted. The right side was unremarkable.  The patient's subsequent history is as detailed below.  INTERVAL HISTORY: Debbie [name] was evaluated in the multidisciplinary breast cancer clinic 07/12/2013, accompanied by her husband Debbie [name].  REVIEW OF SYSTEMS: There were no  specific symptoms leading to the original mammogram, which was routinely scheduled. The patient has a history of migraines but denies  visual changes, nausea, vomiting, stiff neck, dizziness, or gait imbalance. There has been no cough, phlegm production, or pleurisy, no chest pain or pressure, and no change in bowel or bladder habits. The patient denies fever, rash, bleeding, unexplained fatigue or unexplained weight loss. She does have a history of mild GERD symptoms, moderate to mild fatigue, some sinus symptoms and a mild sore throat. A detailed review of systems today was otherwise entirely negative.  PAST MEDICAL HISTORY: Past Medical History  Diagnosis Date  . MVP (mitral valve prolapse)   . IBS (irritable bowel syndrome)   . Migraines     menstral  . Hyperlipidemia   . MVP (mitral valve prolapse)   . Migraines   . IBS (irritable bowel syndrome)   . Abnormal Pap smear   . Seasonal allergies   . Anxiety   . Menorrhagia     PAST SURGICAL HISTORY: Past Surgical History  Procedure Laterality Date  . Wisdom teeth removal    . Vulva /perineum biopsy  2011  . Svd       x 3  . Endometrial ablation      FAMILY HISTORY Family History  Problem Relation Age of Onset  . Cancer Maternal Grandmother     BREAST AND UTERINE CANCER  . Hyperlipidemia Mother   . Hypertension Mother   . Cancer Father     multiple myeloma  The patient's father is living, currently age 79. He has a history of multiple myeloma. The patient's mother  is 69. The patient's parents live in [place] The patient had no brothers, 2 sisters. There is no history of breast or ovarian cancer in the family. She believes one of her grandfathers died from lung cancer and another from pancreatic cancer, but is not sure  GYNECOLOGIC HISTORY:  Menarche age 86, first live birth age 53, which the patient understands is an independent risk factor for breast cancer. She is GX P3. She still having regular periods. 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 Her husband Debbie [name] is a Energy manager at [employer]. Her 3 children are Debbie [name] (14), Debbie [name] (10) and Debbie [name] (7). The patient attends a local [employer]: Not in place   HEALTH MAINTENANCE: History  Substance Use Topics  . Smoking status: Never Smoker   . Smokeless tobacco: Never Used  . Alcohol Use: No     Colonoscopy: January 2015  BTD:HRCBULAG 2014  Bone density:  Lipid panel:  Allergies  Allergen Reactions  . Dust Mite Extract Itching    Current Outpatient Prescriptions  Medication Sig Dispense Refill  . atorvastatin (LIPITOR) 20 MG tablet Take 20 mg by mouth daily.      . cetirizine (ZYRTEC) 10 MG tablet Take 10 mg by mouth daily.      Marland Kitchen PARoxetine (PAXIL) 10 MG tablet Take 10 mg by mouth every morning.      . Vitamin D, Ergocalciferol, (DRISDOL) 50000 UNITS CAPS capsule Take by mouth every 7 (seven) days.       . ALPRAZolam (XANAX) 0.5 MG tablet       . Cholecalciferol (VITAMIN D) 2000 UNITS tablet Take 4,000 Units by mouth daily.      Marland Kitchen FeFum-FePoly-FA-B Cmp-C-Biot (INTEGRA PLUS) CAPS Take 1 capsule by mouth daily.  12 capsule  0  . FROVA 2.5 MG tablet       . mometasone (NASONEX) 50 MCG/ACT nasal spray Place 2 sprays into the nose daily.      . Multiple Vitamin (MULTIVITAMIN) tablet Take 1 tablet by mouth daily.      Marland Kitchen oxymetazoline (AFRIN) 0.05 % nasal spray Place 2 sprays into the nose daily as needed. congestion       No current facility-administered medications for this visit.    OBJECTIVE: Middle-aged white woman in no acute distress  Filed Vitals:   07/12/13 1242  BP: 121/74  Pulse: 90  Temp: 98.8 F (37.1 C)  Resp: 18     Body mass index is 34.56 kg/(m^2).    ECOG FS:0 - Asymptomatic  Ocular: Sclerae unicteric, pupils equal, round and reactive to light Ear-nose-throat: Oropharynx clear,  dentition  in good repair  Lymphatic: No cervical or supraclavicular adenopathy Lungs no rales or rhonchi, good excursion bilaterally Heart regular rate and rhythm, no murmur appreciated Abd soft, nontender, positive bowel sounds MSK no focal spinal tenderness, no joint edema Neuro: non-focal, well-oriented, appropriate affect Breasts: The right breast is unremarkable. The left breast is status post recent biopsy. There is a minimal ecchymosis. The mass in the lower inner quadrant of the left breast is difficult to characterize by palpation. It is closer to 3 than 22 cm. I do not palpate any left axillary adenopathy.   LAB RESULTS:  CMP     Component Value Date/Time   NA 139 07/12/2013 1229   K 4.9 07/12/2013 1229   CO2 25  07/12/2013 1229   GLUCOSE 105 07/12/2013 1229   BUN 11.0 07/12/2013 1229   CREATININE 0.7 07/12/2013 1229   CALCIUM 9.9 07/12/2013 1229   PROT 7.2 07/12/2013 1229   ALBUMIN 4.3 07/12/2013 1229   AST 18 07/12/2013 1229   ALT 22 07/12/2013 1229   ALKPHOS 79 07/12/2013 1229   BILITOT 0.45 07/12/2013 1229    I No results found for this basename: SPEP, UPEP,  kappa and lambda light chains    Lab Results  Component Value Date   WBC 9.7 07/12/2013   NEUTROABS 6.1 07/12/2013   HGB 13.4 07/12/2013   HCT 39.5 07/12/2013   MCV 83.3 07/12/2013   PLT 335 07/12/2013      Chemistry      Component Value Date/Time   NA 139 07/12/2013 1229   K 4.9 07/12/2013 1229   CO2 25 07/12/2013 1229   BUN 11.0 07/12/2013 1229   CREATININE 0.7 07/12/2013 1229      Component Value Date/Time   CALCIUM 9.9 07/12/2013 1229   ALKPHOS 79 07/12/2013 1229   AST 18 07/12/2013 1229   ALT 22 07/12/2013 1229   BILITOT 0.45 07/12/2013 1229       No results found for this basename: LABCA2    No components found with this basename: LABCA125    No results found for this basename: INR,  in the last 168 hours  Urinalysis    Component Value Date/Time   BILIRUBINUR Negative 09/22/2011 1451    UROBILINOGEN negative 09/22/2011 1451   NITRITE Negative 09/22/2011 1451   LEUKOCYTESUR Trace 09/22/2011 1451    STUDIES: Mr Breast Bilateral W Wo Contrast  07/07/2013   CLINICAL DATA:  Patient with recently diagnosed left breast carcinoma and metastatic left axillary lymphadenopathy.  EXAM: BILATERAL BREAST MRI WITH AND WITHOUT CONTRAST  TECHNIQUE: Multiplanar, multisequence MR images of both breasts were obtained prior to and following the intravenous administration of 60m of MultiHance.  THREE-DIMENSIONAL MR IMAGE RENDERING ON INDEPENDENT WORKSTATION:  Three-dimensional MR images were rendered by post-processing of the original MR data on an independent workstation. The three-dimensional MR images were interpreted, and findings are reported in the following complete MRI report for this study. Three dimensional images were evaluated at the independent DynaCad workstation  COMPARISON:  Previous exams  FINDINGS: Breast composition: c:  Heterogeneous fibroglandular tissue  Background parenchymal enhancement: Moderate  Right breast: No mass or abnormal enhancement.  Left breast: Within the 8 o'clock position left breast middle depth there is a 2.4 x 2.2 by 2.2 cm enhancing mass containing a biopsy marking clip compatible with recently biopsy proven left breast carcinoma. No additional concerning areas of enhancement are identified within the left breast.  Lymph nodes: There is a 2.6 cm enlarged left axillary lymph node compatible with metastatic left axillary lymphadenopathy. There is a 0.5 cm left internal mammary lymph node.  Ancillary findings:  None.  IMPRESSION: Enhancing mass within the 8 o'clock position left breast compatible with biopsy-proven carcinoma.  Enlarged left axillary lymph node compatible biopsy proven metastatic adenopathy.  Nonspecific 0.5 cm left internal mammary lymph node. Metastatic disease not excluded.  RECOMMENDATION: Treatment plan for known left breast malignancy.  BI-RADS CATEGORY   6: Known biopsy-proven malignancy - appropriate action should be taken.   Electronically Signed   By: DLovey NewcomerM.D.   On: 07/07/2013 15:48   Mm Digital Diagnostic Unilat L  06/30/2013   CLINICAL DATA:  Post left breast ultrasound-guided biopsy 8 o'clock location with clip placement.  EXAM: POST-BIOPSY CLIP PLACEMENT left DIAGNOSTIC MAMMOGRAM  COMPARISON:  Previous exams.  FINDINGS: Films are performed following ultrasound guided biopsy of left breast mass 8 o'clock location. The heart shaped clip is appropriately located at the left breast mass 8 o'clock location biopsy site.  IMPRESSION: Appropriate heart shaped clip location, left breast 8 o'clock location.  Final Assessment: Post Procedure Mammograms for Marker Placement   Electronically Signed   By: Conchita Paris M.D.   On: 06/30/2013 16:04   Mm Digital Diagnostic Unilat L  06/30/2013   CLINICAL DATA:  The patient underwent screening mammography on 06/19/2013 and was called back for a possible mass in the left lower inner quadrant. Since that time, the patient performed a breast self-examination and palpates a mass in the left lower inner quadrant.  EXAM: DIGITAL DIAGNOSTIC  left MAMMOGRAM  ULTRASOUND left BREAST  COMPARISON:  Prior exams  ACR Breast Density Category c: The breast tissue is heterogeneously dense, which may obscure small masses.  FINDINGS: Additional views confirm the presence of an irregular spiculated dense mass measuring 2.2 cm in the left lower inner quadrant, corresponding to the screening mammographic finding.  On physical exam, I palpate a mass in the left breast 8 o'clock location corresponding to the patient's questioned palpable finding and area of the mammographic abnormality.  Ultrasound is performed, showing an irregular shadowing hypoechoic mass left breast 8 o'clock location 3 cm from the nipple measuring 2.3 x 2.3 x 1.0 cm. This corresponds to the mammographic and palpable finding. In the left axilla, a dominant  abnormal appearing lymph node cortical thickening is identified measuring 1.2 cm in short axis diameter.  IMPRESSION: Suspicious left breast mass 8 o'clock location with abnormal ipsilateral left axillary lymphadenopathy. Ultrasound-guided core biopsy will be performed of both areas and dictated separately.  RECOMMENDATION: Left breast and axillary core needle biopsy  I have discussed the findings and recommendations with the patient. Results were also provided in writing at the conclusion of the visit. If applicable, a reminder letter will be sent to the patient regarding the next appointment.  BI-RADS CATEGORY  4: Suspicious abnormality - biopsy should be considered.   Electronically Signed   By: Conchita Paris M.D.   On: 06/30/2013 14:59   Mm Digital Screening  06/20/2013   CLINICAL DATA:  Screening.  EXAM: DIGITAL SCREENING BILATERAL MAMMOGRAM WITH CAD  COMPARISON:  Previous exam(s)  ACR Breast Density Category c: The breast tissue is heterogeneously dense, which may obscure small masses.  FINDINGS: In the left breast, a possible mass warrants further evaluation with spot compression views and possibly ultrasound. In the right breast, no masses or malignant type calcifications are identified. Images were processed with CAD.  IMPRESSION: Further evaluation is suggested for possible mass in the left breast.  RECOMMENDATION: Diagnostic mammogram and possibly ultrasound of the left breast. (Code:FI-L-31M)  The patient will be contacted regarding the findings, and additional imaging will be scheduled.  BI-RADS CATEGORY  0: Incomplete. Need additional imaging evaluation and/or prior mammograms for comparison.   Electronically Signed   By: Abelardo Diesel M.D.   On: 06/20/2013 13:06   Mm Radiologist Eval And Mgmt  07/03/2013   EXAM: ESTABLISHED PATIENT OFFICE VISIT - LEVEL II ([id number])  EXAM: There is ecchymosis in the lower inner quadrant of the left breast. I palpate the breast mass. Biopsy site is clean and dry.  In the left axilla, there is minimal ecchymosis without palpable abnormality.  HISTORY OF PRESENT ILLNESS: Over night, patient has  done well. She reports bruising at the site of left breast biopsy. She reports no significant bleeding or pain.  CHIEF COMPLAINT: The patient returns to discuss biopsy results after ultrasound-guided core biopsy of mass in the left breast 8 o'clock location and left axillary lymph node.  PATHOLOGY: Biopsy of the 8 o'clock location shows invasive ductal carcinoma and ductal carcinoma in situ.  Biopsy of the left axilla shows invasive mammary carcinoma with lymphoid inflammation, with differential diagnosis being total replacement of the lymph node by metastatic tumor or metastatic saw tumor deposit within prominent lymphoid inflammation.  ASSESSMENT AND PLAN: ASSESSMENT AND PLAN The patient is scheduled to be seen at the Multidisciplinary Clinic on 07/12/2013. MRI has been scheduled later this week. She was given Scientist, clinical (histocompatibility and immunogenetics). I discussed the findings and recommendations with the patient and her husband. Questions were answered.   Electronically Signed   By: Shon Hale M.D.   On: 07/03/2013 15:04   US Breast Ltd Uni Left Inc Axilla  06/30/2013   CLINICAL DATA:  The patient underwent screening mammography on 06/19/2013 and was called back for a possible mass in the left lower inner quadrant. Since that time, the patient performed a breast self-examination and palpates a mass in the left lower inner quadrant.  EXAM: DIGITAL DIAGNOSTIC  left MAMMOGRAM  ULTRASOUND left BREAST  COMPARISON:  Prior exams  ACR Breast Density Category c: The breast tissue is heterogeneously dense, which may obscure small masses.  FINDINGS: Additional views confirm the presence of an irregular spiculated dense mass measuring 2.2 cm in the left lower inner quadrant, corresponding to the screening mammographic finding.  On physical exam, I palpate a mass in the left breast 8 o'clock location  corresponding to the patient's questioned palpable finding and area of the mammographic abnormality.  Ultrasound is performed, showing an irregular shadowing hypoechoic mass left breast 8 o'clock location 3 cm from the nipple measuring 2.3 x 2.3 x 1.0 cm. This corresponds to the mammographic and palpable finding. In the left axilla, a dominant abnormal appearing lymph node cortical thickening is identified measuring 1.2 cm in short axis diameter.  IMPRESSION: Suspicious left breast mass 8 o'clock location with abnormal ipsilateral left axillary lymphadenopathy. Ultrasound-guided core biopsy will be performed of both areas and dictated separately.  RECOMMENDATION: Left breast and axillary core needle biopsy  I have discussed the findings and recommendations with the patient. Results were also provided in writing at the conclusion of the visit. If applicable, a reminder letter will be sent to the patient regarding the next appointment.  BI-RADS CATEGORY  4: Suspicious abnormality - biopsy should be considered.   Electronically Signed   By: Conchita Paris M.D.   On: 06/30/2013 14:59   [place] Lt Breast Bx W Loc Dev 1st Lesion Img Bx Spec US Guide  06/30/2013   CLINICAL DATA:  Suspicious left breast mass 8 o'clock location and left axillary lymphadenopathy  EXAM: ULTRASOUND GUIDED left BREAST CORE NEEDLE BIOPSY WITH VACUUM ASSIST  COMPARISON:  Previous exams.  PROCEDURE: I met with the patient and we discussed the procedure of ultrasound-guided biopsy, including benefits and alternatives. We discussed the high likelihood of a successful procedure. We discussed the risks of the procedure including infection, bleeding, tissue injury, clip migration, and inadequate sampling. Informed written consent was given. The usual time-out protocol was performed immediately prior to the procedure.  Using sterile technique and 2% Lidocaine as local anesthetic, under direct ultrasound visualization, a 12 gauge vacuum-assisteddevice was  used to  perform biopsy of the left breast mass 8 o'clock location using a medial to lateral approach. At the conclusion of the procedure, a heart shaped tissue marker clip was deployed into the biopsy cavity. Follow-up 2-view mammogram was performed and dictated separately.  IMPRESSION: Ultrasound-guided biopsy of left breast mass 8 o'clock location with heart shaped clip marker placement. Left axillary lymph node was subsequently biopsied and will be dictated under a separate report. No apparent complications.   Electronically Signed   By: Conchita Paris M.D.   On: 06/30/2013 15:00   [place] Lt Breast Bx W Loc Dev Ea Add Lesion Img Bx Spec US Guide  06/30/2013   CLINICAL DATA:  Left axillary abnormal lymph node and suspicious left breast mass 8 o'clock location.  EXAM: ULTRASOUND GUIDED CORE NEEDLE BIOPSY OF A left AXILLARY NODE  COMPARISON:  Previous exams.  FINDINGS: I met with the patient and we discussed the procedure of ultrasound-guided biopsy, including benefits and alternatives. We discussed the high likelihood of a successful procedure. We discussed the risks of the procedure, including infection, bleeding, tissue injury, clip migration, and inadequate sampling. Informed written consent was given. The usual time-out protocol was performed immediately prior to the procedure.  Using sterile technique and 2% Lidocaine as local anesthetic, under direct ultrasound visualization, a 12 gauge vacuum assisted device was used to perform biopsy of abnormal left axillary lymph node using a lateral to medial approach. At the conclusion of the procedure a clip was not deployed into the sampled node.  IMPRESSION: Ultrasound guided biopsy of abnormal left axillary lymph node without clip placement. Pathology is pending. No apparent complications. Left breast mass 8 o'clock location core biopsy was performed today and dictated separately.   Electronically Signed   By: Conchita Paris M.D.   On: 06/30/2013 15:16     ASSESSMENT: 48 y.o. [place] woman status post left breast and left axillary lymph node biopsy 06/30/2013 for a clinical T2 N1-2 invasive ductal carcinoma, grade 2, estrogen and progesterone receptors both strongly positive, HER-2 nonamplified, with an MIB-1 of 36%.  (1) there is a 5 mm left internal mammary lymph node which will be evaluated with PET scan  PLAN: We spent the better part of today's hour-long appointment discussing the biology of breast cancer in general, and the specifics of the patient's tumor in particular. Christina understands there is no survival difference between receiving chemotherapy first followed by surgery, as compared to surgery first followed by chemotherapy. In her case we think starting with chemotherapy has the advantage that she can get her genetics testing done and receive those results before making a definitive decision regarding what kind of surgery she is going to want to in addition, she may qualify for the [employer] 11202 study assuming the axillary lymph nodes have resolved clinically after neoadjuvant treatment.  As far as systemic therapy is concerned, she will clearly benefit from chemotherapy as per NCCN guidelines. Today we discussed a dose dense doxorubicin/cyclophosphamide to be followed by dose dense paclitaxel. She has a good understanding of the possible toxicities, side effects and complications as well as the benefits of chemotherapy. She would like to continue to work right through treatment. Tentatively I would think we could start March 5 and that is the target date.  Minervia has a 5 mm left internal mammary lymph node which needs further evaluation. We are setting her up for a PET scan in the next few days. She will also need an echocardiogram. She will have a port  placed early next week. She will return to see me 07/28/2013 and on that visit we will discuss the results of her staging studies and review how to take her anti-emetic and other  supportive medicines and set her up for her initial treatment.  The patient has also been scheduled for genetic testing.  Aliscia is interested in a second opinion, and I am going to refer her to Dr. Ethelene Hal at [facility] for that purpose. Hopefully she can be seen in the near future so her treatment does not have to be delayed.  Bular has a good understanding of the overall plan, and agrees with it. She knows the goal of treatment in her case is cure. She will call with any problems that may develop before her next visit here.   Chauncey Cruel, MD   07/12/2013 3:52 PM

## 2013-07-12 NOTE — Telephone Encounter (Signed)
appts made and printed. Pt is aware that i will give her order to [facility] for pre cert and call her w/ appt...td

## 2013-07-13 ENCOUNTER — Encounter (HOSPITAL_COMMUNITY): Payer: Self-pay | Admitting: Pharmacy Technician

## 2013-07-13 ENCOUNTER — Other Ambulatory Visit: Payer: Self-pay | Admitting: Oncology

## 2013-07-13 ENCOUNTER — Encounter: Payer: Self-pay | Admitting: *Deleted

## 2013-07-13 NOTE — Progress Notes (Signed)
Request for referral to Dr. Gypsy Balsam for a second opinion sent to Charlie Pitter in medical records.

## 2013-07-13 NOTE — Progress Notes (Signed)
[place] Psychosocial Distress Screening Clinical Social Work  Clinical Social Work Intern met Patient at breast clinic.  The patient scored a 3 on the Psychosocial Distress Thermometer which indicates mild distress. Clinical Social Worker Intern spoke with Patient to assess for distress and other psychosocial needs. Patient was accompanied by husband.  Patient appears to have adequate support at this time.  CSWI reviewed available resources/programs at [facility] to assist in Patient finds that needs change.  CSWI provided Patient with written information regarding programs and contacts.  Patient and family are aware to reach out to [facility] [place] as needed.   Clinical Social Worker follow up needed: no  If yes, follow up plan:   Cordie Beazley S. [place] Work Intern [employer] [phone]

## 2013-07-14 ENCOUNTER — Telehealth: Payer: Self-pay | Admitting: *Deleted

## 2013-07-14 ENCOUNTER — Ambulatory Visit (HOSPITAL_BASED_OUTPATIENT_CLINIC_OR_DEPARTMENT_OTHER): Payer: BC Managed Care – PPO | Admitting: Genetic Counselor

## 2013-07-14 ENCOUNTER — Other Ambulatory Visit: Payer: BC Managed Care – PPO

## 2013-07-14 ENCOUNTER — Encounter: Payer: Self-pay | Admitting: Genetic Counselor

## 2013-07-14 DIAGNOSIS — Z8049 Family history of malignant neoplasm of other genital organs: Secondary | ICD-10-CM

## 2013-07-14 DIAGNOSIS — Z803 Family history of malignant neoplasm of breast: Secondary | ICD-10-CM

## 2013-07-14 DIAGNOSIS — IMO0002 Reserved for concepts with insufficient information to code with codable children: Secondary | ICD-10-CM

## 2013-07-14 DIAGNOSIS — C773 Secondary and unspecified malignant neoplasm of axilla and upper limb lymph nodes: Secondary | ICD-10-CM

## 2013-07-14 DIAGNOSIS — C50919 Malignant neoplasm of unspecified site of unspecified female breast: Secondary | ICD-10-CM | POA: Insufficient documentation

## 2013-07-14 DIAGNOSIS — Z8542 Personal history of malignant neoplasm of other parts of uterus: Secondary | ICD-10-CM

## 2013-07-14 DIAGNOSIS — C50312 Malignant neoplasm of lower-inner quadrant of left female breast: Secondary | ICD-10-CM

## 2013-07-14 NOTE — Telephone Encounter (Signed)
sw pt gv appt for echo for 07/21/13@2  pm and 07/28/13@11 :20am to see Dr. Haroldine Laws. Pt plans are to probably rs because shes considering a second OP....td

## 2013-07-14 NOTE — Progress Notes (Signed)
Dr.  Sarajane Jews Magrinat requested a consultation for genetic counseling and risk assessment for Debbie [name], a 48 y.o. female, for discussion of her personal history of breast cancer and family history of uterine cancer.  She presents to clinic today to discuss the possibility of a genetic predisposition to cancer, and to further clarify her risks, as well as her family members' risks for cancer.   HISTORY OF PRESENT ILLNESS: In 2015, at the age of 44, Debbie [name] was diagnosed with invasive ductal carcinoma of the left breast. This will be treated with chemotherapy, radiation and surgery, pending genetics.  The tumor is ER+/PR+/Her2-.  She had a colonoscopy in Dec. 2014 due to bleeding and did not have polyps.      Past Medical History  Diagnosis Date  . MVP (mitral valve prolapse)   . IBS (irritable bowel syndrome)   . Migraines     menstral  . Hyperlipidemia   . MVP (mitral valve prolapse)   . Migraines   . IBS (irritable bowel syndrome)   . Abnormal Pap smear   . Seasonal allergies   . Anxiety   . Menorrhagia   . Breast cancer 2015    ER+/PR+/Her2-    Past Surgical History  Procedure Laterality Date  . Wisdom teeth removal    . Vulva /perineum biopsy  2011  . Svd       x 3  . Endometrial ablation      History   Social History  . Marital Status: Married    Spouse Name: N/A    Number of Children: 3  . Years of Education: N/A   Occupational History  .     Social History Main Topics  . Smoking status: Never Smoker   . Smokeless tobacco: Never Used  . Alcohol Use: No  . Drug Use: No  . Sexual Activity: Yes    Partners: Male    Birth Control/ Protection: Other-see comments     Comment: SPOUSE HAD A VASEC.   Other Topics Concern  . None   Social History Narrative  . None    REPRODUCTIVE HISTORY AND PERSONAL RISK ASSESSMENT FACTORS: Menarche was at age 24.   premenopausal Uterus Intact: yes Ovaries Intact: yes G4P3A1, first live birth at age 60  She has  not previously undergone treatment for infertility.   Oral Contraceptive use: 5 years   She has not used HRT in the past.    FAMILY HISTORY:  We obtained a detailed, 4-generation family history.  Significant diagnoses are listed below: Family History  Problem Relation Age of Onset  . Cancer Maternal Grandmother     BREAST AND UTERINE CANCER  . Uterine cancer Maternal Grandmother 84  . Hyperlipidemia Mother   . Hypertension Mother   . Multiple myeloma Father 67  . Lung cancer Maternal Development worker, international aid  . Testicular cancer Other 18  Both parents are only children.  Patient's ancestors are of unknown descent. There is no reported Ashkenazi Jewish ancestry. There is no known consanguinity.  GENETIC COUNSELING ASSESSMENT: Debbie [name] is a 48 y.o. female with a personal history of breast cancer and family history of uterine cancer which somewhat suggestive of a hereditary cancer syndrome and predisposition to cancer. We, therefore, discussed and recommended the following at today's visit.   DISCUSSION: We reviewed the characteristics, features and inheritance patterns of hereditary cancer syndromes. We also discussed genetic testing, including the appropriate family members to test, the  process of testing, insurance coverage and turn-around-time for results.   PLAN: After considering the risks, benefits, and limitations, Debbie [name] provided informed consent to pursue genetic testing and the blood sample will be sent to [facility] for analysis of the Women's hereditary breast panel. We discussed the implications of a positive, negative and/ or variant of uncertain significance genetic test result. Results should be available within approximately 3 weeks' time, at which point they will be disclosed by telephone to Debbie [name], as will any additional recommendations warranted by these results. Debbie [name] will receive a summary of her genetic counseling visit and a copy of her  results once available. This information will also be available in Epic. We encouraged Debbie [name] to remain in contact with cancer genetics annually so that we can continuously update the family history and inform her of any changes in cancer genetics and testing that may be of benefit for her family. Debbie [name]'s questions were answered to her satisfaction today. Our contact information was provided should additional questions or concerns arise.  The patient was seen for a total of 60 minutes, greater than 50% of which was spent face-to-face counseling.  This note will also be sent to the referring provider via the electronic medical record. The patient will be supplied with a summary of this genetic counseling discussion as well as educational information on the discussed hereditary cancer syndromes following the conclusion of their visit.   Patient was discussed with Dr. Marcy Panning.   _______________________________________________________________________ For Office Staff:  Number of people involved in session: 1 Was an Intern/ student involved with case: no

## 2013-07-15 ENCOUNTER — Encounter (HOSPITAL_COMMUNITY): Payer: Self-pay

## 2013-07-15 ENCOUNTER — Ambulatory Visit (HOSPITAL_COMMUNITY)
Admission: RE | Admit: 2013-07-15 | Discharge: 2013-07-15 | Disposition: A | Discharge status: Home / Self Care | Payer: BC Managed Care – PPO | Source: Ambulatory Visit | Attending: Oncology | Admitting: Oncology

## 2013-07-15 DIAGNOSIS — N63 Unspecified lump in unspecified breast: Secondary | ICD-10-CM | POA: Insufficient documentation

## 2013-07-15 DIAGNOSIS — C50919 Malignant neoplasm of unspecified site of unspecified female breast: Secondary | ICD-10-CM | POA: Insufficient documentation

## 2013-07-15 DIAGNOSIS — C773 Secondary and unspecified malignant neoplasm of axilla and upper limb lymph nodes: Secondary | ICD-10-CM | POA: Insufficient documentation

## 2013-07-15 DIAGNOSIS — K449 Diaphragmatic hernia without obstruction or gangrene: Secondary | ICD-10-CM | POA: Insufficient documentation

## 2013-07-15 DIAGNOSIS — C50312 Malignant neoplasm of lower-inner quadrant of left female breast: Secondary | ICD-10-CM

## 2013-07-15 MED ORDER — IOHEXOL 300 MG/ML  SOLN
100.0000 mL | Freq: Once | INTRAMUSCULAR | Status: AC | PRN
  Administered 2013-07-15: 100 mL via INTRAVENOUS

## 2013-07-15 MED ORDER — FLUDEOXYGLUCOSE F - 18 (FDG) INJECTION
10.9000 | Freq: Once | INTRAVENOUS | Status: AC | PRN
  Administered 2013-07-15: 10.9 via INTRAVENOUS

## 2013-07-17 ENCOUNTER — Ambulatory Visit (HOSPITAL_COMMUNITY)
Admission: RE | Admit: 2013-07-17 | Discharge: 2013-07-17 | Disposition: A | Discharge status: Home / Self Care | Payer: BC Managed Care – PPO | Source: Ambulatory Visit | Attending: Anesthesiology | Admitting: Anesthesiology

## 2013-07-17 ENCOUNTER — Encounter (HOSPITAL_COMMUNITY)
Admission: RE | Admit: 2013-07-17 | Discharge: 2013-07-17 | Disposition: A | Discharge status: Home / Self Care | Payer: BC Managed Care – PPO | Source: Ambulatory Visit | Attending: General Surgery | Admitting: General Surgery

## 2013-07-17 ENCOUNTER — Telehealth: Payer: Self-pay | Admitting: Oncology

## 2013-07-17 ENCOUNTER — Telehealth: Payer: Self-pay | Admitting: *Deleted

## 2013-07-17 ENCOUNTER — Encounter (HOSPITAL_COMMUNITY): Payer: Self-pay

## 2013-07-17 DIAGNOSIS — Z01810 Encounter for preprocedural cardiovascular examination: Secondary | ICD-10-CM

## 2013-07-17 DIAGNOSIS — C50919 Malignant neoplasm of unspecified site of unspecified female breast: Secondary | ICD-10-CM | POA: Insufficient documentation

## 2013-07-17 DIAGNOSIS — Z01818 Encounter for other preprocedural examination: Secondary | ICD-10-CM | POA: Insufficient documentation

## 2013-07-17 LAB — GLUCOSE, CAPILLARY: Glucose-Capillary: 109 mg/dL — ABNORMAL HIGH (ref 70–99)

## 2013-07-17 MED ORDER — CEFAZOLIN SODIUM-DEXTROSE 2-3 GM-% IV SOLR
2.0000 g | INTRAVENOUS | Status: AC
  Administered 2013-07-18: 2 g via INTRAVENOUS
  Filled 2013-07-17: qty 50

## 2013-07-17 NOTE — Telephone Encounter (Signed)
Per staff message and POF I have scheduled appts.  JMW  

## 2013-07-17 NOTE — Telephone Encounter (Signed)
, °

## 2013-07-17 NOTE — Telephone Encounter (Signed)
I called patient to check in following [place] on 07/12/13.  Reviewed patient's upcoming appointments.  Patient verified that she has an appointment at [facility] scheduled for 07/25/13 for a second opinion.  Patient interested in the results from her PET scan and I instructed her to ask Dr. Barry Dienes in am for the results.  Patient denied any questions or concerns related to her diagnosis or treatment plan.  I verified that she had my contact information and encouraged her to call me for any needs.

## 2013-07-17 NOTE — Pre-Procedure Instructions (Signed)
Debbie [name]  07/17/2013   Your procedure is scheduled on:  Tuesday, February 17.  Report to [facility]/ Entrance "A" at 5:30AM.  Call this number if you have problems the morning of surgery: [phone]   Remember:   Do not eat food or drink liquids after midnight Monday.   Take these medicines the morning of surgery with A SIP OF WATER: cetirizine (ZYRTEC), PARoxetine (PAXIL).     Do not wear jewelry, make-up or nail polish.  Do not wear lotions, powders, or perfumes. You may wear deodorant.  Do not shave 48 hours prior to surgery. Men may shave face and neck.  Do not bring valuables to the hospital.  [facility] is not responsible for any belongings or valuables.               Contacts, dentures or bridgework may not be worn into surgery.  Leave suitcase in the car. After surgery it may be brought to your room.  For patients admitted to the hospital, discharge time is determined by your treatment team.               Patients discharged the day of surgery will not be allowed to drive home.  Name and phone number of your driver: -     Please read over the following fact sheets that you were given: Pain Booklet, Coughing and Deep Breathing and Surgical Site Infection Prevention

## 2013-07-18 ENCOUNTER — Ambulatory Visit (HOSPITAL_COMMUNITY): Payer: BC Managed Care – PPO

## 2013-07-18 ENCOUNTER — Telehealth: Payer: Self-pay | Admitting: Oncology

## 2013-07-18 ENCOUNTER — Ambulatory Visit (HOSPITAL_COMMUNITY)
Admission: RE | Admit: 2013-07-18 | Discharge: 2013-07-18 | Disposition: A | Discharge status: Home / Self Care | Payer: BC Managed Care – PPO | Source: Ambulatory Visit | Attending: General Surgery | Admitting: General Surgery

## 2013-07-18 ENCOUNTER — Ambulatory Visit (HOSPITAL_COMMUNITY): Payer: BC Managed Care – PPO | Admitting: Anesthesiology

## 2013-07-18 ENCOUNTER — Encounter (HOSPITAL_COMMUNITY): Payer: Self-pay | Admitting: Surgery

## 2013-07-18 ENCOUNTER — Encounter (HOSPITAL_COMMUNITY): Payer: BC Managed Care – PPO | Admitting: Anesthesiology

## 2013-07-18 ENCOUNTER — Encounter (HOSPITAL_COMMUNITY)
Admission: RE | Disposition: A | Discharge status: Home / Self Care | Payer: Self-pay | Source: Ambulatory Visit | Attending: General Surgery

## 2013-07-18 DIAGNOSIS — F411 Generalized anxiety disorder: Secondary | ICD-10-CM | POA: Insufficient documentation

## 2013-07-18 DIAGNOSIS — C50319 Malignant neoplasm of lower-inner quadrant of unspecified female breast: Secondary | ICD-10-CM | POA: Insufficient documentation

## 2013-07-18 DIAGNOSIS — C50919 Malignant neoplasm of unspecified site of unspecified female breast: Secondary | ICD-10-CM

## 2013-07-18 DIAGNOSIS — G43909 Migraine, unspecified, not intractable, without status migrainosus: Secondary | ICD-10-CM | POA: Insufficient documentation

## 2013-07-18 DIAGNOSIS — K589 Irritable bowel syndrome without diarrhea: Secondary | ICD-10-CM | POA: Insufficient documentation

## 2013-07-18 DIAGNOSIS — N920 Excessive and frequent menstruation with regular cycle: Secondary | ICD-10-CM | POA: Insufficient documentation

## 2013-07-18 DIAGNOSIS — I059 Rheumatic mitral valve disease, unspecified: Secondary | ICD-10-CM | POA: Insufficient documentation

## 2013-07-18 DIAGNOSIS — Z01810 Encounter for preprocedural cardiovascular examination: Secondary | ICD-10-CM | POA: Insufficient documentation

## 2013-07-18 DIAGNOSIS — Z01818 Encounter for other preprocedural examination: Secondary | ICD-10-CM | POA: Insufficient documentation

## 2013-07-18 DIAGNOSIS — E785 Hyperlipidemia, unspecified: Secondary | ICD-10-CM | POA: Insufficient documentation

## 2013-07-18 HISTORY — PX: PORTACATH PLACEMENT: SHX2246

## 2013-07-18 LAB — HCG, SERUM, QUALITATIVE: Preg, Serum: NEGATIVE

## 2013-07-18 SURGERY — INSERTION, TUNNELED CENTRAL VENOUS DEVICE, WITH PORT
Anesthesia: General | Site: Chest | Laterality: Left

## 2013-07-18 MED ORDER — PROMETHAZINE HCL 25 MG/ML IJ SOLN: 6.2500 mg | INTRAMUSCULAR | Status: DC | PRN

## 2013-07-18 MED ORDER — MIDAZOLAM HCL 2 MG/2ML IJ SOLN
INTRAMUSCULAR | Status: AC
  Filled 2013-07-18: qty 2

## 2013-07-18 MED ORDER — PROPOFOL 10 MG/ML IV BOLUS
INTRAVENOUS | Status: DC | PRN
  Administered 2013-07-18: 200 mg via INTRAVENOUS

## 2013-07-18 MED ORDER — PROPOFOL 10 MG/ML IV BOLUS
INTRAVENOUS | Status: AC
  Filled 2013-07-18: qty 20

## 2013-07-18 MED ORDER — ROCURONIUM BROMIDE 50 MG/5ML IV SOLN
INTRAVENOUS | Status: AC
  Filled 2013-07-18: qty 1

## 2013-07-18 MED ORDER — SODIUM CHLORIDE 0.9 % IR SOLN
Status: DC | PRN
  Administered 2013-07-18: 08:00:00

## 2013-07-18 MED ORDER — OXYCODONE HCL 5 MG PO TABS
5.0000 mg | ORAL_TABLET | Freq: Once | ORAL | Status: AC | PRN
  Administered 2013-07-18: 5 mg via ORAL

## 2013-07-18 MED ORDER — CHLORHEXIDINE GLUCONATE 4 % EX LIQD
1.0000 "application " | Freq: Once | CUTANEOUS | Status: DC
  Filled 2013-07-18: qty 15

## 2013-07-18 MED ORDER — HEPARIN SOD (PORK) LOCK FLUSH 100 UNIT/ML IV SOLN
INTRAVENOUS | Status: DC | PRN
  Administered 2013-07-18: 500 [IU]

## 2013-07-18 MED ORDER — BUPIVACAINE-EPINEPHRINE (PF) 0.25% -1:200000 IJ SOLN
INTRAMUSCULAR | Status: AC
  Filled 2013-07-18: qty 30

## 2013-07-18 MED ORDER — HYDROMORPHONE HCL PF 1 MG/ML IJ SOLN
INTRAMUSCULAR | Status: AC
  Filled 2013-07-18: qty 1

## 2013-07-18 MED ORDER — OXYCODONE HCL 5 MG PO TABS
ORAL_TABLET | ORAL | Status: AC
  Filled 2013-07-18: qty 1

## 2013-07-18 MED ORDER — LACTATED RINGERS IV SOLN
INTRAVENOUS | Status: DC | PRN
  Administered 2013-07-18: 07:00:00 via INTRAVENOUS

## 2013-07-18 MED ORDER — LIDOCAINE HCL 1 % IJ SOLN
INTRAMUSCULAR | Status: DC | PRN
  Administered 2013-07-18: 08:00:00 via INTRAMUSCULAR

## 2013-07-18 MED ORDER — LIDOCAINE HCL (CARDIAC) 20 MG/ML IV SOLN
INTRAVENOUS | Status: AC
Start: 2013-07-18 — End: 2013-07-18
  Filled 2013-07-18: qty 5

## 2013-07-18 MED ORDER — HEPARIN SOD (PORK) LOCK FLUSH 100 UNIT/ML IV SOLN
INTRAVENOUS | Status: AC
  Filled 2013-07-18: qty 5

## 2013-07-18 MED ORDER — ONDANSETRON HCL 4 MG/2ML IJ SOLN
INTRAMUSCULAR | Status: DC | PRN
Start: 2013-07-18 — End: 2013-07-18
  Administered 2013-07-18: 4 mg via INTRAVENOUS

## 2013-07-18 MED ORDER — LIDOCAINE HCL (CARDIAC) 20 MG/ML IV SOLN
INTRAVENOUS | Status: DC | PRN
  Administered 2013-07-18: 100 mg via INTRAVENOUS

## 2013-07-18 MED ORDER — FENTANYL CITRATE 0.05 MG/ML IJ SOLN
INTRAMUSCULAR | Status: DC | PRN
  Administered 2013-07-18 (×2): 25 ug via INTRAVENOUS
  Administered 2013-07-18: 50 ug via INTRAVENOUS

## 2013-07-18 MED ORDER — PHENYLEPHRINE 40 MCG/ML (10ML) SYRINGE FOR IV PUSH (FOR BLOOD PRESSURE SUPPORT)
PREFILLED_SYRINGE | INTRAVENOUS | Status: AC
  Filled 2013-07-18: qty 10

## 2013-07-18 MED ORDER — OXYCODONE HCL 5 MG/5ML PO SOLN
5.0000 mg | Freq: Once | ORAL | Status: AC | PRN
Start: 2013-07-18 — End: 2013-07-18

## 2013-07-18 MED ORDER — 0.9 % SODIUM CHLORIDE (POUR BTL) OPTIME
TOPICAL | Status: DC | PRN
  Administered 2013-07-18: 1000 mL

## 2013-07-18 MED ORDER — HYDROMORPHONE HCL PF 1 MG/ML IJ SOLN
0.2500 mg | INTRAMUSCULAR | Status: DC | PRN
  Administered 2013-07-18 (×2): 0.5 mg via INTRAVENOUS

## 2013-07-18 MED ORDER — MIDAZOLAM HCL 5 MG/5ML IJ SOLN
INTRAMUSCULAR | Status: DC | PRN
  Administered 2013-07-18 (×2): 1 mg via INTRAVENOUS

## 2013-07-18 MED ORDER — ARTIFICIAL TEARS OP OINT
TOPICAL_OINTMENT | OPHTHALMIC | Status: DC | PRN
  Administered 2013-07-18: 1 via OPHTHALMIC

## 2013-07-18 MED ORDER — FENTANYL CITRATE 0.05 MG/ML IJ SOLN
INTRAMUSCULAR | Status: AC
  Filled 2013-07-18: qty 5

## 2013-07-18 MED ORDER — OXYCODONE-ACETAMINOPHEN 5-325 MG PO TABS: 1.0000 | ORAL_TABLET | ORAL | Status: DC | PRN

## 2013-07-18 MED ORDER — ONDANSETRON HCL 4 MG/2ML IJ SOLN
INTRAMUSCULAR | Status: AC
  Filled 2013-07-18: qty 2

## 2013-07-18 MED ORDER — LIDOCAINE HCL (PF) 1 % IJ SOLN
INTRAMUSCULAR | Status: AC
  Filled 2013-07-18: qty 30

## 2013-07-18 SURGICAL SUPPLY — 53 items
BAG DECANTER FOR FLEXI CONT (MISCELLANEOUS) ×3 IMPLANT
BLADE SURG 11 STRL SS (BLADE) ×3 IMPLANT
BLADE SURG 15 STRL LF DISP TIS (BLADE) ×1 IMPLANT
BLADE SURG 15 STRL SS (BLADE) ×2
CANISTER SUCTION 2500CC (MISCELLANEOUS) IMPLANT
CHLORAPREP W/TINT 10.5 ML (MISCELLANEOUS) IMPLANT
CHLORAPREP W/TINT 26ML (MISCELLANEOUS) ×3 IMPLANT
COVER DRAPE ULTRASOUND 610 023 (DRAPES) IMPLANT
COVER SURGICAL LIGHT HANDLE (MISCELLANEOUS) ×3 IMPLANT
COVER TRANSDUCER ULTRASND (DRAPES) IMPLANT
CRADLE DONUT ADULT HEAD (MISCELLANEOUS) ×3 IMPLANT
DECANTER SPIKE VIAL GLASS SM (MISCELLANEOUS) IMPLANT
DERMABOND ADVANCED (GAUZE/BANDAGES/DRESSINGS) ×2
DERMABOND ADVANCED .7 DNX12 (GAUZE/BANDAGES/DRESSINGS) ×1 IMPLANT
DRAPE C-ARM 42X72 X-RAY (DRAPES) ×3 IMPLANT
DRAPE CHEST BREAST 15X10 FENES (DRAPES) ×3 IMPLANT
DRAPE UTILITY 15X26 W/TAPE STR (DRAPE) ×6 IMPLANT
DRAPE WARM FLUID 44X44 (DRAPE) IMPLANT
ELECT COATED BLADE 2.86 ST (ELECTRODE) ×3 IMPLANT
ELECT REM PT RETURN 9FT ADLT (ELECTROSURGICAL) ×3
ELECTRODE REM PT RTRN 9FT ADLT (ELECTROSURGICAL) ×1 IMPLANT
GAUZE SPONGE 4X4 16PLY XRAY LF (GAUZE/BANDAGES/DRESSINGS) ×3 IMPLANT
GLOVE BIO SURGEON STRL SZ 6 (GLOVE) ×3 IMPLANT
GLOVE BIOGEL PI IND STRL 6.5 (GLOVE) ×3 IMPLANT
GLOVE BIOGEL PI INDICATOR 6.5 (GLOVE) ×6
GLOVE SURG SS PI 6.5 STRL IVOR (GLOVE) ×6 IMPLANT
GOWN STRL REUS W/ TWL LRG LVL3 (GOWN DISPOSABLE) ×2 IMPLANT
GOWN STRL REUS W/TWL 2XL LVL3 (GOWN DISPOSABLE) ×3 IMPLANT
GOWN STRL REUS W/TWL LRG LVL3 (GOWN DISPOSABLE) ×4
KIT BASIN OR (CUSTOM PROCEDURE TRAY) ×3 IMPLANT
KIT PORT POWER 8FR ISP CVUE (Catheter) ×3 IMPLANT
KIT PORT POWER 9.6FR MRI PREA (Catheter) IMPLANT
KIT PORT POWER ISP 8FR (Catheter) IMPLANT
KIT POWER CATH 8FR (Catheter) IMPLANT
KIT ROOM TURNOVER OR (KITS) ×3 IMPLANT
NEEDLE HYPO 25GX1X1/2 BEV (NEEDLE) ×3 IMPLANT
NS IRRIG 1000ML POUR BTL (IV SOLUTION) ×3 IMPLANT
PACK SURGICAL SETUP 50X90 (CUSTOM PROCEDURE TRAY) ×3 IMPLANT
PAD ARMBOARD 7.5X6 YLW CONV (MISCELLANEOUS) ×6 IMPLANT
PENCIL BUTTON HOLSTER BLD 10FT (ELECTRODE) ×3 IMPLANT
SUT MON AB 4-0 PC3 18 (SUTURE) ×3 IMPLANT
SUT PROLENE 2 0 SH DA (SUTURE) ×6 IMPLANT
SUT VIC AB 3-0 SH 27 (SUTURE) ×2
SUT VIC AB 3-0 SH 27X BRD (SUTURE) ×1 IMPLANT
SYR 20ML ECCENTRIC (SYRINGE) ×6 IMPLANT
SYR 5ML LUER SLIP (SYRINGE) ×3 IMPLANT
SYR CONTROL 10ML LL (SYRINGE) ×3 IMPLANT
TOWEL OR 17X24 6PK STRL BLUE (TOWEL DISPOSABLE) IMPLANT
TOWEL OR 17X26 10 PK STRL BLUE (TOWEL DISPOSABLE) ×3 IMPLANT
TUBE CONNECTING 12'X1/4 (SUCTIONS)
TUBE CONNECTING 12X1/4 (SUCTIONS) IMPLANT
WATER STERILE IRR 1000ML POUR (IV SOLUTION) IMPLANT
YANKAUER SUCT BULB TIP NO VENT (SUCTIONS) IMPLANT

## 2013-07-18 NOTE — Interval H&P Note (Signed)
History and Physical Interval Note:  07/18/2013 7:22 AM  Debbie [name]  has presented today for surgery, with the diagnosis of LEFT BREAST CANCER  The various methods of treatment have been discussed with the patient and family. After consideration of risks, benefits and other options for treatment, the patient has consented to  Procedure(s): INSERTION PORT-A-CATH (N/A) as a surgical intervention .  The patient's history has been reviewed, patient examined, no change in status, stable for surgery.  I have reviewed the patient's chart and labs.  Questions were answered to the patient's satisfaction.     Morgane Joerger

## 2013-07-18 NOTE — Anesthesia Procedure Notes (Signed)
Procedure Name: LMA Insertion Date/Time: 07/18/2013 7:38 AM Performed by: Erik Obey Pre-anesthesia Checklist: Patient identified, Patient being monitored, Emergency Drugs available, Timeout performed and Suction available Patient Re-evaluated:Patient Re-evaluated prior to inductionOxygen Delivery Method: Circle system utilized Preoxygenation: Pre-oxygenation with 100% oxygen Intubation Type: IV induction LMA: LMA inserted LMA Size: 4.0 Number of attempts: 1 Placement Confirmation: positive ETCO2 and breath sounds checked- equal and bilateral Tube secured with: Tape Dental Injury: Teeth and Oropharynx as per pre-operative assessment

## 2013-07-18 NOTE — Discharge Instructions (Signed)
[place] Office Phone Number [phone]   POST OP INSTRUCTIONS  Always review your discharge instruction sheet given to you by the facility where your surgery was performed.  IF YOU HAVE DISABILITY OR FAMILY LEAVE FORMS, YOU MUST BRING THEM TO THE OFFICE FOR PROCESSING.  DO NOT GIVE THEM TO YOUR DOCTOR.  1. A prescription for pain medication may be given to you upon discharge.  Take your pain medication as prescribed, if needed.  If narcotic pain medicine is not needed, then you may take acetaminophen (Tylenol) or ibuprofen (Advil) as needed. 2. Take your usually prescribed medications unless otherwise directed 3. If you need a refill on your pain medication, please contact your pharmacy.  They will contact our office to request authorization.  Prescriptions will not be filled after 5pm or on week-ends. 4. You should eat very light the first 24 hours after surgery, such as soup, crackers, pudding, etc.  Resume your normal diet the day after surgery 5. It is common to experience some constipation if taking pain medication after surgery.  Increasing fluid intake and taking a stool softener will usually help or prevent this problem from occurring.  A mild laxative (Milk of Magnesia or Miralax) should be taken according to package directions if there are no bowel movements after 48 hours. 6. You may shower in 48 hours.  The surgical glue will flake off in 2-3 weeks.   7. ACTIVITIES:  No strenuous activity or heavy lifting for 1 week.   a. You may drive when you no longer are taking prescription pain medication, you can comfortably wear a seatbelt, and you can safely maneuver your car and apply brakes. b. RETURN TO WORK:  __________as tolerated, probably Thursday or Friday _______________ Dennis Bast should see your doctor in the office for a follow-up appointment approximately three-four weeks after your surgery.    WHEN TO CALL YOUR DOCTOR: 1. Fever over 101.0 2. Nausea and/or  vomiting. 3. Extreme swelling or bruising. 4. Continued bleeding from incision. 5. Increased pain, redness, or drainage from the incision.  The clinic staff is available to answer your questions during regular business hours.  Please dont hesitate to call and ask to speak to one of the nurses for clinical concerns.  If you have a medical emergency, go to the nearest emergency room or call 911.  A surgeon from [facility] is always on call at the hospital.  For further questions, please visit [email or web address]

## 2013-07-18 NOTE — Transfer of Care (Signed)
Immediate Anesthesia Transfer of Care Note  Patient: Debbie [name]  Procedure(s) Performed: Procedure(s): INSERTION PORT-A-CATH (Left)  Patient Location: PACU  Anesthesia Type:General  Level of Consciousness: awake, alert  and oriented  Airway & Oxygen Therapy: Patient Spontanous Breathing and Patient connected to nasal cannula oxygen  Post-op Assessment: Report given to PACU RN and Post -op Vital signs reviewed and stable  Post vital signs: Reviewed and stable  Complications: No apparent anesthesia complications

## 2013-07-18 NOTE — Anesthesia Postprocedure Evaluation (Signed)
Anesthesia Post Note  Patient: Debbie [name]  Procedure(s) Performed: Procedure(s) (LRB): INSERTION PORT-A-CATH (Left)  Anesthesia type: general  Patient location: PACU  Post pain: Pain level controlled  Post assessment: Patient's Cardiovascular Status Stable  Last Vitals:  Filed Vitals:   07/18/13 0915  BP: 117/66  Pulse: 74  Temp:   Resp: 12    Post vital signs: Reviewed and stable  Level of consciousness: sedated  Complications: No apparent anesthesia complications

## 2013-07-18 NOTE — Telephone Encounter (Signed)
Pt appt. With Dr. [email or web address]  is 07/24/13. Pt is aware

## 2013-07-18 NOTE — Anesthesia Preprocedure Evaluation (Signed)
Anesthesia Evaluation    Reviewed: Allergy & Precautions, H&P , NPO status , Patient's Chart, lab work & pertinent test results  Airway       Dental   Pulmonary neg pulmonary ROS,  breath sounds clear to auscultation        Cardiovascular negative cardio ROS      Neuro/Psych  Headaches, negative neurological ROS  negative psych ROS   GI/Hepatic Neg liver ROS, IBS   Endo/Other  negative endocrine ROS  Renal/GU negative Renal ROS     Musculoskeletal   Abdominal   Peds  Hematology   Anesthesia Other Findings   Reproductive/Obstetrics negative OB ROS                           Anesthesia Physical Anesthesia Plan  ASA: II  Anesthesia Plan: General LMA   Post-op Pain Management:    Induction:   Airway Management Planned:   Additional Equipment:   Intra-op Plan:   Post-operative Plan:   Informed Consent:   Plan Discussed with: Anesthesiologist, CRNA and Surgeon  Anesthesia Plan Comments:         Anesthesia Quick Evaluation

## 2013-07-18 NOTE — Op Note (Signed)
PREOPERATIVE DIAGNOSIS:  Left breast cancer, cT2N1M0     POSTOPERATIVE DIAGNOSIS:  Same     PROCEDURE: Left subclavian port placement, Bard ClearVue  Power Port, MRI safe, 8-French.      SURGEON:  [name], MD      ANESTHESIA:  General   FINDINGS:  Good venous return, easy flush, and tip of the catheter and   SVC 22.5 cm.      SPECIMEN:  None.      ESTIMATED BLOOD LOSS:  Minimal.      COMPLICATIONS:  None known.      PROCEDURE:  Pt was identified in the holding area and taken to   the operating room, where patient was placed supine on the operating room   table.  General anesthesia was induced.  Patient's arms were tucked and the upper   chest and neck were prepped and draped in sterile fashion.  Time-out was   performed according to the surgical safety check list.  When all was   correct, we continued.   Local anesthetic was administered over this   area at the angle of the clavicle.  The vein was accessed with 2 passes of the needle. There was good venous return and the wire passed easily with ectopy that resolved after pulling the wire back just a bit.  Fluoroscopy was used to confirm that the wire was in the vena cava.      The patient was placed back level and the area for the pocket was anethetized   with local anesthetic.  A 3-cm transverse incision was made with a #15   blade.  Cautery was used to divide the subcutaneous tissues down to the   pectoralis muscle.  An [employer] retractor was used to elevate the skin   while a pocket was created on top of the pectoralis fascia.  The port   was placed into the pocket to confirm that it was of adequate size.  The   catheter was preattached to the port.  The port was then secured to the   pectoralis fascia with four 2-0 Prolene sutures.  These were clamped and   not tied down yet.    The catheter was tunneled through to the wire exit   site.  The catheter was placed along the wire to determine what length it should be to  be in the SVC.  The catheter was cut at 22.5 cm.  The tunneler sheath and dilator were passed over the wire and the dilator and wire were removed.  The catheter was advanced through the tunneler sheath and the tunneler sheath was pulled away.  Care was taken to keep the catheter in the tunneler sheath as this occurred. This was advanced and the tunneler sheath was removed.  There was good venous   return and easy flush of the catheter.  The Prolene sutures were tied   down to the pectoral fascia.  The skin was reapproximated using 3-0   Vicryl interrupted deep dermal sutures.    Fluoroscopy was used to re-confirm good position of the catheter.  The skin   was then closed using 4-0 Monocryl in a subcuticular fashion.  The port was flushed with concentrated heparin flush as well.  The wounds were then cleaned, dried, and dressed with Dermabond.  The patient was awakened from anesthesia and taken to the PACU in stable condition.  Needle, sponge, and instrument counts were correct.  [name], MD

## 2013-07-18 NOTE — H&P (View-Only) (Signed)
Chief complaint:  New left breast cancer   HISTORY: Pt is a 48 yo F who presents with a palpable mass found when she went for her screening mammogram.  She denies any prior breast biopsies.  She breast fed 3 children.  Menarche at age 58.  She is still having periods.  She was 33 at first child's birth.  She used hormonal birth control for 3.5 years.  She is up to date on her colonoscopy.  She had first mammogram at age 34.  She has had some breast pain since MRI.  She has no family history of breast cancer.  Her dad had multiple myeloma, and her maternal grandmother had uterine cancer.    Past Medical History  Diagnosis Date  . MVP (mitral valve prolapse)   . IBS (irritable bowel syndrome)   . Migraines     menstral  . Hyperlipidemia   . MVP (mitral valve prolapse)   . Migraines   . IBS (irritable bowel syndrome)   . Abnormal Pap smear   . Seasonal allergies   . Anxiety   . Menorrhagia     Past Surgical History  Procedure Laterality Date  . Wisdom teeth removal    . Vulva /perineum biopsy  2011  . Svd       x 3  . Endometrial ablation      Current Outpatient Prescriptions  Medication Sig Dispense Refill  . ALPRAZolam (XANAX) 0.5 MG tablet       . atorvastatin (LIPITOR) 20 MG tablet Take 20 mg by mouth daily.      . cetirizine (ZYRTEC) 10 MG tablet Take 10 mg by mouth daily.      . Cholecalciferol (VITAMIN D) 2000 UNITS tablet Take 4,000 Units by mouth daily.      Marland Kitchen FeFum-FePoly-FA-B Cmp-C-Biot (INTEGRA PLUS) CAPS Take 1 capsule by mouth daily.  12 capsule  0  . FROVA 2.5 MG tablet       . mometasone (NASONEX) 50 MCG/ACT nasal spray Place 2 sprays into the nose daily.      . Multiple Vitamin (MULTIVITAMIN) tablet Take 1 tablet by mouth daily.      Marland Kitchen oxymetazoline (AFRIN) 0.05 % nasal spray Place 2 sprays into the nose daily as needed. congestion      . PARoxetine (PAXIL) 10 MG tablet Take 10 mg by mouth every morning.      . Vitamin D, Ergocalciferol, (DRISDOL) 50000 UNITS  CAPS capsule Take by mouth every 7 (seven) days.        No current facility-administered medications for this visit.     Allergies  Allergen Reactions  . Dust Mite Extract Itching     Family History  Problem Relation Age of Onset  . Cancer Maternal Grandmother     BREAST AND UTERINE CANCER  . Hyperlipidemia Mother   . Hypertension Mother   . Cancer Father     multiple myeloma     History   Social History  . Marital Status: Married    Spouse Name: N/A    Number of Children: N/A  . Years of Education: N/A   Social History Main Topics  . Smoking status: Never Smoker   . Smokeless tobacco: Never Used  . Alcohol Use: No  . Drug Use: No  . Sexual Activity: Yes    Partners: Male    Birth Control/ Protection: Other-see comments     Comment: SPOUSE HAD A VASEC.     REVIEW OF SYSTEMS - PERTINENT  POSITIVES ONLY: 12 point review of systems negative other than HPI and PMH except for night sweats, fatigue, sore throat, sinus issues, heartburn. Headaches, anxiety  EXAM: There were no vitals filed for this visit.  Wt Readings from Last 3 Encounters:  07/12/13 204 lb 6.4 oz (92.715 kg)  03/30/12 203 lb (92.08 kg)  10/08/11 200 lb (90.719 kg)     Gen:  No acute distress.  Well nourished and well groomed.   Neurological: Alert and oriented to person, place, and time. Coordination normal.  Head: Normocephalic and atraumatic.  Eyes: Conjunctivae are normal. Pupils are equal, round, and reactive to light. No scleral icterus.  Neck: Normal range of motion. Neck supple. No tracheal deviation or thyromegaly present.  Cardiovascular: Normal rate, regular rhythm, normal heart sounds and intact distal pulses.  Exam reveals no gallop and no friction rub.  No murmur heard. Breast: palpable mass in lower inner quadrant around 3 cm.  Palpable LN in left axilla.  No other masses.  No masses on right breast. No nipple retraction of skin dimpling.   Respiratory: Effort normal.  No  respiratory distress. No chest wall tenderness. Breath sounds normal.  No wheezes, rales or rhonchi.  GI: Soft. Bowel sounds are normal. The abdomen is soft and nontender.  There is no rebound and no guarding.  Musculoskeletal:  Extremities are nontender.  Lymphadenopathy: No cervical, preauricular, postauricular, supraclavicular or infraclavicular adenopathy is present Skin: Skin is warm and dry. No rash noted. No diaphoresis. No erythema. No pallor. No clubbing, cyanosis, or edema.   Psychiatric: Normal mood and affect. Behavior is normal. Judgment and thought content normal.    LABORATORY RESULTS: Available labs are reviewed  Pathology 1. 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 2. Lymph node, needle/core biopsy, left axilla - INVASIVE MAMMARY CARCINOMA WITH LYMPHOID INFLAMMATION, SEE COMMENT. Hormone receptors positive, Ki67 36%, Her 2 not overexpressed.     Recent Results (from the past 2160 hour(s))  CBC WITH DIFFERENTIAL     Status: None   Collection Time    07/12/13 12:29 PM      Result Value Ref Range   WBC 9.7  3.9 - 10.3 10e3/uL   NEUT# 6.1  1.5 - 6.5 10e3/uL   HGB 13.4  11.6 - 15.9 g/dL   HCT 39.5  34.8 - 46.6 %   Platelets 335  145 - 400 10e3/uL   MCV 83.3  79.5 - 101.0 fL   MCH 28.2  25.1 - 34.0 pg   MCHC 33.9  31.5 - 36.0 g/dL   RBC 4.74  3.70 - 5.45 10e6/uL   RDW 13.9  11.2 - 14.5 %   lymph# 2.3  0.9 - 3.3 10e3/uL   MONO# 0.8  0.1 - 0.9 10e3/uL   Eosinophils Absolute 0.4  0.0 - 0.5 10e3/uL   Basophils Absolute 0.1  0.0 - 0.1 10e3/uL   NEUT% 63.5  38.4 - 76.8 %   LYMPH% 23.5  14.0 - 49.7 %   MONO% 8.5  0.0 - 14.0 %   EOS% 3.9  0.0 - 7.0 %   BASO% 0.6  0.0 - 2.0 %  COMPREHENSIVE METABOLIC PANEL (ZT24)     Status: None   Collection Time    07/12/13 12:29 PM      Result Value Ref Range   Sodium 139  136 - 145 mEq/L   Potassium 4.9  3.5 - 5.1 mEq/L   Chloride  106  98 - 109 mEq/L    CO2 25  22 - 29 mEq/L   Glucose 105  70 - 140 mg/dl   BUN 11.0  7.0 - 26.0 mg/dL   Creatinine 0.7  0.6 - 1.1 mg/dL   Total Bilirubin 0.45  0.20 - 1.20 mg/dL   Alkaline Phosphatase 79  40 - 150 U/L   AST 18  5 - 34 U/L   ALT 22  0 - 55 U/L   Total Protein 7.2  6.4 - 8.3 g/dL   Albumin 4.3  3.5 - 5.0 g/dL   Calcium 9.9  8.4 - 10.4 mg/dL   Anion Gap 8  3 - 11 mEq/L     RADIOLOGY RESULTS: See E-Chart or I-Site for most recent results.  Images and reports are reviewed.  Mr Breast Bilateral W Wo Contrast  07/07/2013   CLINICAL DATA:  Patient with recently diagnosed left breast carcinoma and metastatic left axillary lymphadenopathy.  EXAM: BILATERAL BREAST MRI WITH AND WITHOUT CONTRAST  TECHNIQUE: Multiplanar, multisequence MR images of both breasts were obtained prior to and following the intravenous administration of 39m of MultiHance.  THREE-DIMENSIONAL MR IMAGE RENDERING ON INDEPENDENT WORKSTATION:  Three-dimensional MR images were rendered by post-processing of the original MR data on an independent workstation. The three-dimensional MR images were interpreted, and findings are reported in the following complete MRI report for this study. Three dimensional images were evaluated at the independent DynaCad workstation  COMPARISON:  Previous exams  FINDINGS: Breast composition: c:  Heterogeneous fibroglandular tissue  Background parenchymal enhancement: Moderate  Right breast: No mass or abnormal enhancement.  Left breast: Within the 8 o'clock position left breast middle depth there is a 2.4 x 2.2 by 2.2 cm enhancing mass containing a biopsy marking clip compatible with recently biopsy proven left breast carcinoma. No additional concerning areas of enhancement are identified within the left breast.  Lymph nodes: There is a 2.6 cm enlarged left axillary lymph node compatible with metastatic left axillary lymphadenopathy. There is a 0.5 cm left internal mammary lymph node.  Ancillary findings:  None.   IMPRESSION: Enhancing mass within the 8 o'clock position left breast compatible with biopsy-proven carcinoma.  Enlarged left axillary lymph node compatible biopsy proven metastatic adenopathy.  Nonspecific 0.5 cm left internal mammary lymph node. Metastatic disease not excluded.  RECOMMENDATION: Treatment plan for known left breast malignancy.  BI-RADS CATEGORY  6: Known biopsy-proven malignancy - appropriate action should be taken.   Electronically Signed   By: DLovey NewcomerM.D.   On: 07/07/2013 15:48   Mm Digital Diagnostic Unilat L  06/30/2013   CLINICAL DATA:  Post left breast ultrasound-guided biopsy 8 o'clock location with clip placement.  EXAM: POST-BIOPSY CLIP PLACEMENT left DIAGNOSTIC MAMMOGRAM  COMPARISON:  Previous exams.  FINDINGS: Films are performed following ultrasound guided biopsy of left breast mass 8 o'clock location. The heart shaped clip is appropriately located at the left breast mass 8 o'clock location biopsy site.  IMPRESSION: Appropriate heart shaped clip location, left breast 8 o'clock location.  Final Assessment: Post Procedure Mammograms for Marker Placement   Electronically Signed   By: GConchita ParisM.D.   On: 06/30/2013 16:04   Mm Digital Diagnostic Unilat L  06/30/2013   CLINICAL DATA:  The patient underwent screening mammography on 06/19/2013 and was called back for a possible mass in the left lower inner quadrant. Since that time, the patient performed a breast self-examination and palpates a mass in the left lower inner quadrant.  EXAM: DIGITAL DIAGNOSTIC  left MAMMOGRAM  ULTRASOUND left BREAST  COMPARISON:  Prior exams  ACR Breast Density Category c: The breast tissue is heterogeneously dense, which may obscure small masses.  FINDINGS: Additional views confirm the presence of an irregular spiculated dense mass measuring 2.2 cm in the left lower inner quadrant, corresponding to the screening mammographic finding.  On physical exam, I palpate a mass in the left breast 8  o'clock location corresponding to the patient's questioned palpable finding and area of the mammographic abnormality.  Ultrasound is performed, showing an irregular shadowing hypoechoic mass left breast 8 o'clock location 3 cm from the nipple measuring 2.3 x 2.3 x 1.0 cm. This corresponds to the mammographic and palpable finding. In the left axilla, a dominant abnormal appearing lymph node cortical thickening is identified measuring 1.2 cm in short axis diameter.  IMPRESSION: Suspicious left breast mass 8 o'clock location with abnormal ipsilateral left axillary lymphadenopathy. Ultrasound-guided core biopsy will be performed of both areas and dictated separately.  RECOMMENDATION: Left breast and axillary core needle biopsy  I have discussed the findings and recommendations with the patient. Results were also provided in writing at the conclusion of the visit. If applicable, a reminder letter will be sent to the patient regarding the next appointment.  BI-RADS CATEGORY  4: Suspicious abnormality - biopsy should be considered.   Electronically Signed   By: Conchita Paris M.D.   On: 06/30/2013 14:59   Mm Digital Screening  06/20/2013   CLINICAL DATA:  Screening.  EXAM: DIGITAL SCREENING BILATERAL MAMMOGRAM WITH CAD  COMPARISON:  Previous exam(s)  ACR Breast Density Category c: The breast tissue is heterogeneously dense, which may obscure small masses.  FINDINGS: In the left breast, a possible mass warrants further evaluation with spot compression views and possibly ultrasound. In the right breast, no masses or malignant type calcifications are identified. Images were processed with CAD.  IMPRESSION: Further evaluation is suggested for possible mass in the left breast.  RECOMMENDATION: Diagnostic mammogram and possibly ultrasound of the left breast. (Code:FI-L-9M)  The patient will be contacted regarding the findings, and additional imaging will be scheduled.  BI-RADS CATEGORY  0: Incomplete. Need additional  imaging evaluation and/or prior mammograms for comparison.   Electronically Signed   By: Abelardo Diesel M.D.   On: 06/20/2013 13:06   Mm Radiologist Eval And Mgmt  07/03/2013   EXAM: ESTABLISHED PATIENT OFFICE VISIT - LEVEL II ([id number])  EXAM: There is ecchymosis in the lower inner quadrant of the left breast. I palpate the breast mass. Biopsy site is clean and dry. In the left axilla, there is minimal ecchymosis without palpable abnormality.  HISTORY OF PRESENT ILLNESS: Over night, patient has done well. She reports bruising at the site of left breast biopsy. She reports no significant bleeding or pain.  CHIEF COMPLAINT: The patient returns to discuss biopsy results after ultrasound-guided core biopsy of mass in the left breast 8 o'clock location and left axillary lymph node.  PATHOLOGY: Biopsy of the 8 o'clock location shows invasive ductal carcinoma and ductal carcinoma in situ.  Biopsy of the left axilla shows invasive mammary carcinoma with lymphoid inflammation, with differential diagnosis being total replacement of the lymph node by metastatic tumor or metastatic saw tumor deposit within prominent lymphoid inflammation.  ASSESSMENT AND PLAN: ASSESSMENT AND PLAN The patient is scheduled to be seen at [facility] on 07/12/2013. MRI has been scheduled later this week. She was given Scientist, clinical (histocompatibility and immunogenetics). I discussed the findings and recommendations  with the patient and her husband. Questions were answered.   Electronically Signed   By: Shon Hale M.D.   On: 07/03/2013 15:04   US Breast Ltd Uni Left Inc Axilla  06/30/2013   CLINICAL DATA:  The patient underwent screening mammography on 06/19/2013 and was called back for a possible mass in the left lower inner quadrant. Since that time, the patient performed a breast self-examination and palpates a mass in the left lower inner quadrant.  EXAM: DIGITAL DIAGNOSTIC  left MAMMOGRAM  ULTRASOUND left BREAST  COMPARISON:  Prior exams  ACR Breast  Density Category c: The breast tissue is heterogeneously dense, which may obscure small masses.  FINDINGS: Additional views confirm the presence of an irregular spiculated dense mass measuring 2.2 cm in the left lower inner quadrant, corresponding to the screening mammographic finding.  On physical exam, I palpate a mass in the left breast 8 o'clock location corresponding to the patient's questioned palpable finding and area of the mammographic abnormality.  Ultrasound is performed, showing an irregular shadowing hypoechoic mass left breast 8 o'clock location 3 cm from the nipple measuring 2.3 x 2.3 x 1.0 cm. This corresponds to the mammographic and palpable finding. In the left axilla, a dominant abnormal appearing lymph node cortical thickening is identified measuring 1.2 cm in short axis diameter.  IMPRESSION: Suspicious left breast mass 8 o'clock location with abnormal ipsilateral left axillary lymphadenopathy. Ultrasound-guided core biopsy will be performed of both areas and dictated separately.  RECOMMENDATION: Left breast and axillary core needle biopsy  I have discussed the findings and recommendations with the patient. Results were also provided in writing at the conclusion of the visit. If applicable, a reminder letter will be sent to the patient regarding the next appointment.  BI-RADS CATEGORY  4: Suspicious abnormality - biopsy should be considered.   Electronically Signed   By: Conchita Paris M.D.   On: 06/30/2013 14:59   [place] Lt Breast Bx W Loc Dev 1st Lesion Img Bx Spec US Guide  06/30/2013   CLINICAL DATA:  Suspicious left breast mass 8 o'clock location and left axillary lymphadenopathy  EXAM: ULTRASOUND GUIDED left BREAST CORE NEEDLE BIOPSY WITH VACUUM ASSIST  COMPARISON:  Previous exams.  PROCEDURE: I met with the patient and we discussed the procedure of ultrasound-guided biopsy, including benefits and alternatives. We discussed the high likelihood of a successful procedure. We discussed the  risks of the procedure including infection, bleeding, tissue injury, clip migration, and inadequate sampling. Informed written consent was given. The usual time-out protocol was performed immediately prior to the procedure.  Using sterile technique and 2% Lidocaine as local anesthetic, under direct ultrasound visualization, a 12 gauge vacuum-assisteddevice was used to perform biopsy of the left breast mass 8 o'clock location using a medial to lateral approach. At the conclusion of the procedure, a heart shaped tissue marker clip was deployed into the biopsy cavity. Follow-up 2-view mammogram was performed and dictated separately.  IMPRESSION: Ultrasound-guided biopsy of left breast mass 8 o'clock location with heart shaped clip marker placement. Left axillary lymph node was subsequently biopsied and will be dictated under a separate report. No apparent complications.   Electronically Signed   By: Conchita Paris M.D.   On: 06/30/2013 15:00   [place] Lt Breast Bx W Loc Dev Ea Add Lesion Img Bx Spec US Guide  06/30/2013   CLINICAL DATA:  Left axillary abnormal lymph node and suspicious left breast mass 8 o'clock location.  EXAM: ULTRASOUND GUIDED CORE NEEDLE BIOPSY  OF A left AXILLARY NODE  COMPARISON:  Previous exams.  FINDINGS: I met with the patient and we discussed the procedure of ultrasound-guided biopsy, including benefits and alternatives. We discussed the high likelihood of a successful procedure. We discussed the risks of the procedure, including infection, bleeding, tissue injury, clip migration, and inadequate sampling. Informed written consent was given. The usual time-out protocol was performed immediately prior to the procedure.  Using sterile technique and 2% Lidocaine as local anesthetic, under direct ultrasound visualization, a 12 gauge vacuum assisted device was used to perform biopsy of abnormal left axillary lymph node using a lateral to medial approach. At the conclusion of the procedure a clip was  not deployed into the sampled node.  IMPRESSION: Ultrasound guided biopsy of abnormal left axillary lymph node without clip placement. Pathology is pending. No apparent complications. Left breast mass 8 o'clock location core biopsy was performed today and dictated separately.   Electronically Signed   By: Conchita Paris M.D.   On: 06/30/2013 15:16      ASSESSMENT AND PLAN: Cancer of lower-inner quadrant of left female breast Pt will get PET scan to eval internal mammary node.    If positive or if other disease is seen, would recommend chemo first.  Would place port a cath.    Pt to decide whether to proceed with neoadjuvant chemotherapy or to proceed with lumpectomy/ALND/port   I also discussed the [employer] [id number] trial of ALND vs axillary radiation.  I reviewed that the purpose of this study is to see if patients have similar recurrence free survival without ALND.  We will refer patient to genetics.  We will offer patient plastics referral to help with her decision making process.    45 min spent in counseling.       Milus Height MD Surgical Oncology, General and [facility]      Visit Diagnoses: 1. Cancer of lower-inner quadrant of left female breast     Primary Care Physician: Gara Kroner, MD  MOODY, John - rad onc MAGRINAT, Gus - med onc

## 2013-07-18 NOTE — OR Nursing (Signed)
Implantable port information packet and patient reference card attached to patient's hard copy chart. Hard copy chart sent with patient to the post-anesthesia care unit.  Narda Rutherford, RN

## 2013-07-18 NOTE — Preoperative (Signed)
Beta Blockers   Reason not to administer Beta Blockers:Not Applicable 

## 2013-07-21 ENCOUNTER — Encounter (HOSPITAL_COMMUNITY): Payer: Self-pay | Admitting: General Surgery

## 2013-07-21 ENCOUNTER — Ambulatory Visit (HOSPITAL_COMMUNITY)
Admission: RE | Admit: 2013-07-21 | Discharge: 2013-07-21 | Disposition: A | Discharge status: Home / Self Care | Payer: BC Managed Care – PPO | Source: Ambulatory Visit | Attending: Internal Medicine | Admitting: Internal Medicine

## 2013-07-21 DIAGNOSIS — I519 Heart disease, unspecified: Secondary | ICD-10-CM

## 2013-07-21 DIAGNOSIS — C50919 Malignant neoplasm of unspecified site of unspecified female breast: Secondary | ICD-10-CM

## 2013-07-21 DIAGNOSIS — Z79899 Other long term (current) drug therapy: Secondary | ICD-10-CM | POA: Insufficient documentation

## 2013-07-21 DIAGNOSIS — C50312 Malignant neoplasm of lower-inner quadrant of left female breast: Secondary | ICD-10-CM

## 2013-07-21 NOTE — Progress Notes (Signed)
Echocardiogram 2D Echocardiogram has been performed.  Joelene Millin 07/21/2013, 2:16 PM

## 2013-07-24 ENCOUNTER — Telehealth: Payer: Self-pay | Admitting: Oncology

## 2013-07-24 NOTE — Telephone Encounter (Signed)
, °

## 2013-07-25 ENCOUNTER — Other Ambulatory Visit: Payer: BC Managed Care – PPO

## 2013-07-26 ENCOUNTER — Encounter: Payer: Self-pay | Admitting: Genetic Counselor

## 2013-07-28 ENCOUNTER — Ambulatory Visit (HOSPITAL_COMMUNITY)
Admission: RE | Admit: 2013-07-28 | Discharge: 2013-07-28 | Disposition: A | Discharge status: Home / Self Care | Payer: BC Managed Care – PPO | Source: Ambulatory Visit | Attending: Internal Medicine | Admitting: Internal Medicine

## 2013-07-28 ENCOUNTER — Encounter (HOSPITAL_COMMUNITY): Payer: Self-pay

## 2013-07-28 ENCOUNTER — Ambulatory Visit (HOSPITAL_BASED_OUTPATIENT_CLINIC_OR_DEPARTMENT_OTHER): Payer: BC Managed Care – PPO | Admitting: Oncology

## 2013-07-28 ENCOUNTER — Encounter: Payer: Self-pay | Admitting: *Deleted

## 2013-07-28 ENCOUNTER — Telehealth: Payer: Self-pay | Admitting: Oncology

## 2013-07-28 VITALS — BP 128/72 | HR 79 | Ht 64.5 in | Wt 206.8 lb

## 2013-07-28 DIAGNOSIS — E7800 Pure hypercholesterolemia, unspecified: Secondary | ICD-10-CM

## 2013-07-28 DIAGNOSIS — K589 Irritable bowel syndrome without diarrhea: Secondary | ICD-10-CM

## 2013-07-28 DIAGNOSIS — C50319 Malignant neoplasm of lower-inner quadrant of unspecified female breast: Secondary | ICD-10-CM

## 2013-07-28 DIAGNOSIS — G43909 Migraine, unspecified, not intractable, without status migrainosus: Secondary | ICD-10-CM

## 2013-07-28 DIAGNOSIS — I059 Rheumatic mitral valve disease, unspecified: Secondary | ICD-10-CM | POA: Insufficient documentation

## 2013-07-28 DIAGNOSIS — N920 Excessive and frequent menstruation with regular cycle: Secondary | ICD-10-CM

## 2013-07-28 DIAGNOSIS — C50312 Malignant neoplasm of lower-inner quadrant of left female breast: Secondary | ICD-10-CM

## 2013-07-28 DIAGNOSIS — I341 Nonrheumatic mitral (valve) prolapse: Secondary | ICD-10-CM

## 2013-07-28 MED ORDER — PROCHLORPERAZINE MALEATE 10 MG PO TABS
10.0000 mg | ORAL_TABLET | Freq: Four times a day (QID) | ORAL | Status: DC | PRN
Start: 2013-07-28 — End: 2013-09-28

## 2013-07-28 MED ORDER — LIDOCAINE-PRILOCAINE 2.5-2.5 % EX CREA: 1.0000 "application " | TOPICAL_CREAM | CUTANEOUS | Status: DC | PRN

## 2013-07-28 MED ORDER — DEXAMETHASONE 4 MG PO TABS: ORAL_TABLET | ORAL | Status: DC

## 2013-07-28 MED ORDER — ONDANSETRON HCL 8 MG PO TABS: 8.0000 mg | ORAL_TABLET | Freq: Two times a day (BID) | ORAL | Status: DC | PRN

## 2013-07-28 MED ORDER — LORAZEPAM 0.5 MG PO TABS: 0.5000 mg | ORAL_TABLET | Freq: Every evening | ORAL | Status: DC | PRN

## 2013-07-28 MED ORDER — LORAZEPAM 0.5 MG PO TABS
0.5000 mg | ORAL_TABLET | Freq: Every evening | ORAL | Status: DC | PRN
Start: 2013-07-28 — End: 2014-04-04

## 2013-07-28 NOTE — [facility] Oncology Navigator Note (Signed)
Patient at [facility] for appointment with Dr. Jana Hakim.  Patient reports that she is doing well.  She is very nervous about upcoming chemotherapy since she is not sure how she will respond.  We talked about what to expect and she was able to teachback information.  She is scheduled to attend the chemotherapy education class next week prior to chemo.  I instructed her to call me if she has any questions after the class.  She has also had her port placed which appears to be healing well with minimal bruising.  Patient denied any other questions or concerns at this time.  I gave patient my card with contact information and instructed her to call me for any needs.  Patient verbalized understanding.

## 2013-07-28 NOTE — Patient Instructions (Signed)
We will contact you in 2 months to schedule your next appointment and echocardiogram  

## 2013-07-28 NOTE — Progress Notes (Signed)
[place]  Telephone:(336) [phone] Fax:([phone]) [phone]     ID: Debbie [name] OB: [birth year]  MR#: [id number]  JYN#:[id number]  PCP: [name], MD GYN:  [name]: [name] OTHER MD: Cline Cools  CHIEF COMPLAINT: " I ' m ready to start chemo."".  HISTORY OF PRESENT ILLNESS: Debbie [name] had routine yearly screening mammography at the breast center 06/19/2013 showing a possible mass in the left breast. The right breast was unremarkable. On 06/30/2013 additional views of the left breast showed an irregular spiculated dense mass measuring 2.2 cm in the lower inner quadrant. This was palpable at the 8:00 location. Ultrasound showed an irregular hypoechoic mass measuring 2.3 cm and in the left axilla a dominant abnormal appearing lymph node with cortical thickening measuring 1.2 cm.  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  [id number] the patient underwent bilateral breast MRI. This showed her breasts to be dense (composition C.). In the left breast at the 8:00 position there was a 2.4 cm enhancing mass containing a biopsy clip. There were no additional areas of concern. In the axilla on the left there was a 2.6 cm enlarged left axillary lymph node. There was also a 0.5 cm left internal mammary lymph nodes noted. The right side was unremarkable.  The patient's subsequent history is as detailed below.  INTERVAL HISTORY: Debbie [name] returns today for followup of her breast cancer. Her husband Debbie [name] was not able to come to see seeing the knee Dr. Today. Since her last  visit here Debbie [name] sought a second opinion at [facility]. She was very pleased with Dr Harden Mo and the staff there. Also since the last visit Debbie [name] had her port placed and a PET which did not show uptake in her internal mammary nodal area.  REVIEW OF SYSTEMS: Debbie [name] tolerated the port placement without event. She is somewhat anxious given the uncertainty of the upcoming treatment, but otherwise a detailed review of systems today was entirely negative.  PAST MEDICAL HISTORY: Past Medical History  Diagnosis Date  . MVP (mitral valve prolapse)   . IBS (irritable bowel syndrome)   . Migraines     menstral  . Hyperlipidemia   . MVP (mitral valve prolapse)   . Migraines   . IBS (irritable bowel syndrome)   . Abnormal Pap smear   . Seasonal allergies   . Anxiety   . Menorrhagia   . Breast cancer 2015    ER+/PR+/Her2-    PAST SURGICAL HISTORY: Past Surgical History  Procedure Laterality Date  . Wisdom teeth removal    . Vulva /perineum biopsy  2011  . Svd       x 3  . Endometrial ablation    . Portacath placement Left 07/18/2013    Procedure: INSERTION PORT-A-CATH;  Surgeon: [name], MD;  Location: [facility] OR;  Service: General;  Laterality: Left;    FAMILY HISTORY Family History  Problem Relation Age of Onset  . Cancer Maternal Grandmother     BREAST AND UTERINE CANCER  . Uterine cancer Maternal Grandmother 84  . Hyperlipidemia Mother   . Hypertension Mother   .  Multiple myeloma Father 33  . Lung cancer Maternal Development worker, international aid  . Testicular cancer Other 13  The patient's father is living, currently age 53. He has a history of multiple myeloma. The patient's mother is 74. The patient's parents live in [place] The patient had no brothers, 2 sisters. There is no history of breast or ovarian cancer in the family. She believes one of her grandfathers died from lung cancer and another from pancreatic cancer, but is not sure  GYNECOLOGIC HISTORY:  Menarche age 60, first  live birth age 42, which the patient understands is an independent risk factor for breast cancer. She is GX P3. She still having regular periods. 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 Her husband Debbie [name] is a Energy manager at [employer]. Her 3 children are [name] (14), [name] (10) and [name] (7). The patient attends a local [place]: Not in place   HEALTH MAINTENANCE: History  Substance Use Topics  . Smoking status: Never Smoker   . Smokeless tobacco: Never Used  . Alcohol Use: No     Colonoscopy: January 2015  OIZ:[id number] 2014  Bone density:  Lipid panel:  Allergies  Allergen Reactions  . Dust Mite Extract Itching    Current Outpatient Prescriptions  Medication Sig Dispense Refill  . atorvastatin (LIPITOR) 20 MG tablet Take 20 mg by mouth daily.      . cetirizine (ZYRTEC) 10 MG tablet Take 10 mg by mouth daily.      Marland Kitchen dexamethasone (DECADRON) 4 MG tablet Take 2 tablets by mouth once a day on the day after chemotherapy and then take 2 tablets two times a day for 2 days. Take with food.  30 tablet  1  . FROVA 2.5 MG tablet Take 2.5 mg by mouth daily as needed for migraine.       . lidocaine-prilocaine (EMLA) cream Apply 1 application topically as needed. Apply over port site 1-2 hours before chemotherapy and cover with plastic wrap  30 g  0  . LORazepam (ATIVAN) 0.5 MG tablet Take 1 tablet (0.5 mg total) by mouth at bedtime as needed (Nausea or vomiting).  30 tablet  0  . mometasone (NASONEX) 50 MCG/ACT nasal spray Place 2 sprays into the nose daily as needed (for allergies).       . ondansetron (ZOFRAN) 8 MG tablet Take 1 tablet (8 mg total) by mouth 2 (two) times daily as needed. Start on the third day after chemotherapy.  30 tablet  1  . oxymetazoline (AFRIN) 0.05 % nasal spray Place 2 sprays into the nose daily as needed. congestion      .  PARoxetine (PAXIL) 20 MG tablet Take 20 mg by mouth every morning.      . prochlorperazine (COMPAZINE) 10 MG tablet Take 1 tablet (10 mg total) by mouth every 6 (six) hours as needed (Nausea or vomiting).  30 tablet  1  . Vitamin D, Ergocalciferol, (DRISDOL) 50000 UNITS CAPS capsule Take 50,000 Units by mouth every 7 (seven) days.        No current facility-administered medications for this visit.    OBJECTIVE: Middle-aged white woman in no acute distress  There were no vitals filed for this visit.   There is no weight on file to calculate BMI.    ECOG FS:0 - Asymptomatic  Sclerae unicteric,  pupils round and equal Oropharynx clear and moist No cervical or supraclavicular adenopathy Lungs no rales or rhonchi Heart regular rate and rhythm Abd soft, nontender, positive bowel sounds MSK no focal spinal tenderness, no upper extremity lymphedema Neuro: nonfocal, well oriented, appropriate affect Breasts: the right breast is unremarkable. The mass in the lower inner quadrant of the left breast is easily palpable. There is no skin change or nipple retraction. I do not palpate any left axillary adenopathy. Skin: The port is in the left upper chest wall. The outline is easily palpable. There is no erythema or swelling.  LAB RESULTS:  CMP     Component Value Date/Time   NA 139 07/12/2013 1229   K 4.9 07/12/2013 1229   CO2 25 07/12/2013 1229   GLUCOSE 105 07/12/2013 1229   BUN 11.0 07/12/2013 1229   CREATININE 0.7 07/12/2013 1229   CALCIUM 9.9 07/12/2013 1229   PROT 7.2 07/12/2013 1229   ALBUMIN 4.3 07/12/2013 1229   AST 18 07/12/2013 1229   ALT 22 07/12/2013 1229   ALKPHOS 79 07/12/2013 1229   BILITOT 0.45 07/12/2013 1229    I No results found for this basename: SPEP,  UPEP,   kappa and lambda light chains    Lab Results  Component Value Date   WBC 9.7 07/12/2013   NEUTROABS 6.1 07/12/2013   HGB 13.4 07/12/2013   HCT 39.5 07/12/2013   MCV 83.3 07/12/2013   PLT 335 07/12/2013      Chemistry       Component Value Date/Time   NA 139 07/12/2013 1229   K 4.9 07/12/2013 1229   CO2 25 07/12/2013 1229   BUN 11.0 07/12/2013 1229   CREATININE 0.7 07/12/2013 1229      Component Value Date/Time   CALCIUM 9.9 07/12/2013 1229   ALKPHOS 79 07/12/2013 1229   AST 18 07/12/2013 1229   ALT 22 07/12/2013 1229   BILITOT 0.45 07/12/2013 1229       No results found for this basename: LABCA2    No components found with this basename: [id number]    No results found for this basename: INR,  in the last 168 hours  Urinalysis    Component Value Date/Time   BILIRUBINUR Negative 09/22/2011 1451   UROBILINOGEN negative 09/22/2011 1451   NITRITE Negative 09/22/2011 1451   LEUKOCYTESUR Trace 09/22/2011 1451    STUDIES:  EXAM:  NUCLEAR MEDICINE PET SKULL BASE TO THIGH  FASTING BLOOD GLUCOSE: Value: 109 mg/dl  TECHNIQUE:  10.9 mCi F-18 FDG was injected intravenously. Full-ring PET imaging  was performed from the skull base to thigh after the radiotracer. CT  data was obtained and used for attenuation correction and anatomic  localization.  COMPARISON: Chest CT done today  FINDINGS:  NECK  No hypermetabolic cervical lymph nodes are identified.There are no  lesions of the pharyngeal mucosal space. There is symmetric activity  within the lymphoid tissue of Waldeyer's ring, within physiologic  limits.  CHEST  Hypermetabolic lesion medially in the left breast demonstrates an  SUV max of 17.7. There is a dominant hypermetabolic left axillary  lymph node with an SUV max of 8.6. There are no hypermetabolic  internal mammary, mediastinal or hilar lymph nodes. There is no  suspicious pulmonary activity. CT findings are deferred to the  diagnostic study of the same date.  ABDOMEN/PELVIS  There is no hypermetabolic activity within the liver, adrenal  glands, spleen or pancreas. There is no hypermetabolic nodal  activity. There is low density  throughout the liver consistent with  steatosis.  SKELETON   There is no hypermetabolic activity to suggest osseous metastatic  disease.  IMPRESSION:  1. Known left breast cancer is hypermetabolic. There is a dominant  hypermetabolic left axillary lymph nodal metastasis.  2. No other evidence of metastatic disease.  Electronically Signed  By: Camie Patience M.D.  On: 07/17/2013 08:22        Transthoracic Echocardiography  Patient: Marguita, Venning MR #: [id number] Study Date: 07/21/2013 Gender: F Age: 48 Height: 165.1cm Weight: 90.7kg BSA: 1.54m2 Pt. Status: Room:  ORDERING Magrinat, GHazle NordmannREFERRING Magrinat, GHazle NordmannREFERRING SAntony ContrasATTENDING BGlori BickersSONOGRAPHER Jimmy Reel PERFORMING [facility], Outpatient cc:  ------------------------------------------------------------ LV EF: 60% - 65%  ------------------------------------------------------------  ASSESSMENT: 48y.o. [place] woman status post left breast and left axillary lymph node biopsy 06/30/2013 for a clinical T2 N1-2, stage IIB/IIIA invasive ductal carcinoma, grade 2, estrogen and progesterone receptors both strongly positive, HER-2 nonamplified, with an MIB-1 of 36%.  (1) there is a 5 mm left internal mammary lymph node which was negative on PET scan  (2) cyclophosphamide and doxorubicin to be started 08/04/2014, given in dose dense fashion x4 with Neulasta day 2; to be followed by paclitaxel  PLAN: We spent approximately 45 minutes today going over her supportive measures. I wrote all the prescriptions for TZyairaand gave her written instructions on how to use the medications. The only variant is from standard is I did ask her to take the prochlorperazine before meals and at bedtime beginning the evening of day 1 and continuing through day 2. If she has absolutely no nausea issues on the first cycle, we can do the prochlorperazine antiviral he as needed. I also suggested she not fill out her ondansetron prescription, but just "good and her back  pocket", this most of my patients find the never had to use it  I have gone ahead and entered all her appointments, labs, chemotherapy treatments and visits through April 23, when she will see me again. At that point we will plan to start of the second part. Of her chemotherapy, and she will have a choice of weekly Taxol x12 or Taxol every 2 weeks x4.  TSmrithihas a good understanding of the overall plan. She agrees with that. She notes a goal of treatment in her cases cure. She will call with any problems that may develop before the next visit.   MChauncey Cruel MD   07/28/2013 2:41 PM

## 2013-07-28 NOTE — Telephone Encounter (Signed)
, °

## 2013-07-29 NOTE — Progress Notes (Signed)
Patient ID: Debbie [name], female   DOB: [birth year], 48 y.o.   MRN: [id number] Oncologist: Dr. Jana Hakim  48 yo with newly-diagnosed breast cancer presents for cardiology evaluation prior to starting doxorubicin-based chemotherapy.  Her tumor is ER+/PR+/HER2-.  She is planned for chemotherapy with doxorubicin, cyclophosphamide, and paclitaxel: 8 weeks of chemo followed by a 2nd 8 weeks.  This will be followed by surgery and radiation.    Patient has no known cardiac problems.  She denies chest pain or exertional dyspnea.  No lightheadedness or syncope.  Echo done this month showed no significant cardiac abnormalities.   PMH:  1. Migraines 2. Hyperlipidemia 3. IBS 4. Breast cancer diagnosed in 2015.  ER+/PR+/HER2-.   - Echo (2/15) with EF 60-65%, global longitudinal strain -21.2%.   SH: Nonsmoker, does secretarial work.   FH: No heart problems that she knows of.   ROS: All systems reviewed and negative except as per HPI.   Current Outpatient Prescriptions  Medication Sig Dispense Refill  . atorvastatin (LIPITOR) 20 MG tablet Take 20 mg by mouth daily.      . cetirizine (ZYRTEC) 10 MG tablet Take 10 mg by mouth daily.      . FROVA 2.5 MG tablet Take 2.5 mg by mouth daily as needed for migraine.       . mometasone (NASONEX) 50 MCG/ACT nasal spray Place 2 sprays into the nose daily as needed (for allergies).       Marland Kitchen oxymetazoline (AFRIN) 0.05 % nasal spray Place 2 sprays into the nose daily as needed. congestion      . PARoxetine (PAXIL) 20 MG tablet Take 20 mg by mouth every morning.      . Vitamin D, Ergocalciferol, (DRISDOL) 50000 UNITS CAPS capsule Take 50,000 Units by mouth every 7 (seven) days.       Marland Kitchen dexamethasone (DECADRON) 4 MG tablet Take 2 tablets by mouth once a day on the day after chemotherapy and then take 2 tablets two times a day for 2 days. Take with food.  30 tablet  1  . lidocaine-prilocaine (EMLA) cream Apply 1 application topically as needed. Apply over port site 1-2 hours  before chemotherapy and cover with plastic wrap  30 g  0  . LORazepam (ATIVAN) 0.5 MG tablet Take 1 tablet (0.5 mg total) by mouth at bedtime as needed (Nausea or vomiting).  30 tablet  0  . ondansetron (ZOFRAN) 8 MG tablet Take 1 tablet (8 mg total) by mouth 2 (two) times daily as needed. Start on the third day after chemotherapy.  30 tablet  1  . prochlorperazine (COMPAZINE) 10 MG tablet Take 1 tablet (10 mg total) by mouth every 6 (six) hours as needed (Nausea or vomiting).  30 tablet  1   No current facility-administered medications for this encounter.    BP 128/72  Pulse 79  Ht 5' 4.5" (1.638 m)  Wt 206 lb 12.8 oz (93.804 kg)  BMI 34.96 kg/m2  SpO2 95%  LMP 07/04/2013 General: NAD Neck: No JVD, no thyromegaly or thyroid nodule.  Lungs: Clear to auscultation bilaterally with normal respiratory effort. CV: Nondisplaced PMI.  Heart regular S1/S2, no S3/S4, no murmur.  No peripheral edema.  No carotid bruit.  Normal pedal pulses.  Abdomen: Soft, nontender, no hepatosplenomegaly, no distention.  Skin: Intact without lesions or rashes.  Neurologic: Alert and oriented x 3.  Psych: Normal affect. Extremities: No clubbing or cyanosis.  HEENT: Normal.   Assessment/Plan: 48 yo with no past cardiac  history will be undergoing chemotherapy involving doxorubicin for her breast cancer.  Baseline echo showed normal EF.  We discussed the risk of cardiac toxicity from doxorubicin and the rationale for screening with echoes.  I am going to get an echo in 2 months (at the mid-point for her chemotherapy) and an office followup.

## 2013-08-01 ENCOUNTER — Encounter: Payer: Self-pay | Admitting: *Deleted

## 2013-08-01 ENCOUNTER — Other Ambulatory Visit: Payer: BC Managed Care – PPO

## 2013-08-03 ENCOUNTER — Encounter: Payer: Self-pay | Admitting: *Deleted

## 2013-08-03 ENCOUNTER — Ambulatory Visit (HOSPITAL_BASED_OUTPATIENT_CLINIC_OR_DEPARTMENT_OTHER): Payer: BC Managed Care – PPO

## 2013-08-03 ENCOUNTER — Other Ambulatory Visit (HOSPITAL_BASED_OUTPATIENT_CLINIC_OR_DEPARTMENT_OTHER): Payer: BC Managed Care – PPO

## 2013-08-03 VITALS — BP 123/70 | HR 85 | Temp 98.4°F

## 2013-08-03 DIAGNOSIS — C50319 Malignant neoplasm of lower-inner quadrant of unspecified female breast: Secondary | ICD-10-CM

## 2013-08-03 DIAGNOSIS — E7800 Pure hypercholesterolemia, unspecified: Secondary | ICD-10-CM

## 2013-08-03 DIAGNOSIS — C50312 Malignant neoplasm of lower-inner quadrant of left female breast: Secondary | ICD-10-CM

## 2013-08-03 DIAGNOSIS — Z5111 Encounter for antineoplastic chemotherapy: Secondary | ICD-10-CM

## 2013-08-03 LAB — CBC WITH DIFFERENTIAL/PLATELET
BASO%: 0.1 % (ref 0.0–2.0)
Basophils Absolute: 0 10*3/uL (ref 0.0–0.1)
EOS%: 4.4 % (ref 0.0–7.0)
Eosinophils Absolute: 0.3 10*3/uL (ref 0.0–0.5)
HCT: 36.1 % (ref 34.8–46.6)
HGB: 12.3 g/dL (ref 11.6–15.9)
LYMPH%: 30.1 % (ref 14.0–49.7)
MCH: 27.9 pg (ref 25.1–34.0)
MCHC: 34.1 g/dL (ref 31.5–36.0)
MCV: 81.9 fL (ref 79.5–101.0)
MONO#: 0.4 10*3/uL (ref 0.1–0.9)
MONO%: 5.7 % (ref 0.0–14.0)
NEUT#: 4.3 10*3/uL (ref 1.5–6.5)
NEUT%: 59.7 % (ref 38.4–76.8)
Platelets: 300 10*3/uL (ref 145–400)
RBC: 4.41 10*6/uL (ref 3.70–5.45)
RDW: 13.2 % (ref 11.2–14.5)
WBC: 7.2 10*3/uL (ref 3.9–10.3)
lymph#: 2.2 10*3/uL (ref 0.9–3.3)
nRBC: 0 % (ref 0–0)

## 2013-08-03 MED ORDER — HEPARIN SOD (PORK) LOCK FLUSH 100 UNIT/ML IV SOLN
500.0000 [IU] | Freq: Once | INTRAVENOUS | Status: AC | PRN
  Administered 2013-08-03: 500 [IU]
  Filled 2013-08-03: qty 5

## 2013-08-03 MED ORDER — PALONOSETRON HCL INJECTION 0.25 MG/5ML
INTRAVENOUS | Status: AC
  Filled 2013-08-03: qty 5

## 2013-08-03 MED ORDER — SODIUM CHLORIDE 0.9 % IV SOLN
150.0000 mg | Freq: Once | INTRAVENOUS | Status: AC
  Administered 2013-08-03: 150 mg via INTRAVENOUS
  Filled 2013-08-03: qty 5

## 2013-08-03 MED ORDER — PALONOSETRON HCL INJECTION 0.25 MG/5ML
0.2500 mg | Freq: Once | INTRAVENOUS | Status: AC
  Administered 2013-08-03: 0.25 mg via INTRAVENOUS

## 2013-08-03 MED ORDER — SODIUM CHLORIDE 0.9 % IV SOLN
Freq: Once | INTRAVENOUS | Status: AC
  Administered 2013-08-03: 15:00:00 via INTRAVENOUS

## 2013-08-03 MED ORDER — DOXORUBICIN HCL CHEMO IV INJECTION 2 MG/ML
60.0000 mg/m2 | Freq: Once | INTRAVENOUS | Status: AC
  Administered 2013-08-03: 124 mg via INTRAVENOUS
  Filled 2013-08-03: qty 62

## 2013-08-03 MED ORDER — DEXAMETHASONE SODIUM PHOSPHATE 20 MG/5ML IJ SOLN
12.0000 mg | Freq: Once | INTRAMUSCULAR | Status: AC
  Administered 2013-08-03: 12 mg via INTRAVENOUS

## 2013-08-03 MED ORDER — DEXAMETHASONE SODIUM PHOSPHATE 20 MG/5ML IJ SOLN
INTRAMUSCULAR | Status: AC
  Filled 2013-08-03: qty 5

## 2013-08-03 MED ORDER — SODIUM CHLORIDE 0.9 % IJ SOLN
10.0000 mL | INTRAMUSCULAR | Status: DC | PRN
  Administered 2013-08-03: 10 mL
  Filled 2013-08-03: qty 10

## 2013-08-03 MED ORDER — SODIUM CHLORIDE 0.9 % IV SOLN
600.0000 mg/m2 | Freq: Once | INTRAVENOUS | Status: AC
  Administered 2013-08-03: 1240 mg via INTRAVENOUS
  Filled 2013-08-03: qty 62

## 2013-08-03 NOTE — Progress Notes (Signed)
Patient has migraine today and took frova (her own medication) for this.

## 2013-08-03 NOTE — Patient Instructions (Signed)
[facility] Discharge Instructions for Patients Receiving Chemotherapy  Today you received the following chemotherapy agents adriamycin and cytoxan.  To help prevent nausea and vomiting after your treatment, we encourage you to take your nausea medication decadron as scheduled, then compazine, ativan or zofran.  Do not start using the zofran for 3 days after chemotherapy (already had long-acting type of zofran with chemotherapy).   If you develop nausea and vomiting that is not controlled by your nausea medication, call the clinic.   BELOW ARE SYMPTOMS THAT SHOULD BE REPORTED IMMEDIATELY:  *FEVER GREATER THAN 100.5 F  *CHILLS WITH OR WITHOUT FEVER  NAUSEA AND VOMITING THAT IS NOT CONTROLLED WITH YOUR NAUSEA MEDICATION  *UNUSUAL SHORTNESS OF BREATH  *UNUSUAL BRUISING OR BLEEDING  TENDERNESS IN MOUTH AND THROAT WITH OR WITHOUT PRESENCE OF ULCERS  *URINARY PROBLEMS  *BOWEL PROBLEMS  UNUSUAL RASH Items with * indicate a potential emergency and should be followed up as soon as possible.  Feel free to call the clinic you have any questions or concerns. The clinic phone number is [phone].

## 2013-08-03 NOTE — [facility] Oncology Navigator Note (Signed)
Patient at [facility], accompanied by her husband, for her first chemotherapy treatment.  She has cut her hair shorter as a transition to losing her hair.  She is in good spirits but apprehensive about what she might experience with her first treatment.  We discussed taking her prescribed meds to reduce her nausea.  Patient denied any questions or concerns at this time.  I encouraged her to call me for any needs.

## 2013-08-04 ENCOUNTER — Ambulatory Visit (HOSPITAL_BASED_OUTPATIENT_CLINIC_OR_DEPARTMENT_OTHER): Payer: BC Managed Care – PPO

## 2013-08-04 ENCOUNTER — Telehealth: Payer: Self-pay | Admitting: *Deleted

## 2013-08-04 VITALS — BP 127/64 | HR 64 | Temp 99.3°F

## 2013-08-04 DIAGNOSIS — C50312 Malignant neoplasm of lower-inner quadrant of left female breast: Secondary | ICD-10-CM

## 2013-08-04 DIAGNOSIS — C50319 Malignant neoplasm of lower-inner quadrant of unspecified female breast: Secondary | ICD-10-CM

## 2013-08-04 DIAGNOSIS — Z5189 Encounter for other specified aftercare: Secondary | ICD-10-CM

## 2013-08-04 MED ORDER — PEGFILGRASTIM INJECTION 6 MG/0.6ML
6.0000 mg | Freq: Once | SUBCUTANEOUS | Status: AC
  Administered 2013-08-04: 6 mg via SUBCUTANEOUS
  Filled 2013-08-04: qty 0.6

## 2013-08-04 NOTE — Telephone Encounter (Signed)
Patient called and left voicemail message to move her inection. I have called her back and left message. Patient already here

## 2013-08-04 NOTE — Patient Instructions (Signed)

## 2013-08-04 NOTE — Telephone Encounter (Signed)
Glen here for Neulasta injection following 1st a/c chemo treatment.  States that she is doing well.  No nausea, vomiting, or diarrhea.  Is drinking her fluids and eating without difficulty.  All questions answered. Knows to call if she has any problems or concerns.

## 2013-08-07 ENCOUNTER — Encounter: Payer: Self-pay | Admitting: *Deleted

## 2013-08-07 NOTE — [facility] Oncology Navigator Note (Signed)
Patient called to see if I can assist her to have her insurance cover the purchase of a wig.  I told patient I would investigate and call her back.  She reports that she has done well following her first chemo treatment last week.  She has only experienced nausea a time or two with no vomiting.  She does feel a little tired but has otherwise tolerated it well thus far.  Patient denied any other needs or concerns at this time.

## 2013-08-10 ENCOUNTER — Other Ambulatory Visit: Payer: Self-pay

## 2013-08-10 ENCOUNTER — Other Ambulatory Visit (HOSPITAL_BASED_OUTPATIENT_CLINIC_OR_DEPARTMENT_OTHER): Payer: BC Managed Care – PPO

## 2013-08-10 ENCOUNTER — Ambulatory Visit (HOSPITAL_BASED_OUTPATIENT_CLINIC_OR_DEPARTMENT_OTHER): Payer: BC Managed Care – PPO | Admitting: Hematology and Oncology

## 2013-08-10 ENCOUNTER — Ambulatory Visit (HOSPITAL_COMMUNITY)
Admission: RE | Admit: 2013-08-10 | Discharge: 2013-08-10 | Disposition: A | Discharge status: Home / Self Care | Payer: BC Managed Care – PPO | Source: Ambulatory Visit | Attending: Hematology and Oncology | Admitting: Hematology and Oncology

## 2013-08-10 VITALS — BP 137/83 | HR 85 | Temp 98.1°F | Resp 20 | Ht 64.5 in | Wt 202.8 lb

## 2013-08-10 DIAGNOSIS — R05 Cough: Secondary | ICD-10-CM | POA: Insufficient documentation

## 2013-08-10 DIAGNOSIS — R509 Fever, unspecified: Secondary | ICD-10-CM | POA: Insufficient documentation

## 2013-08-10 DIAGNOSIS — D702 Other drug-induced agranulocytosis: Secondary | ICD-10-CM

## 2013-08-10 DIAGNOSIS — C50312 Malignant neoplasm of lower-inner quadrant of left female breast: Secondary | ICD-10-CM

## 2013-08-10 DIAGNOSIS — R0989 Other specified symptoms and signs involving the circulatory and respiratory systems: Secondary | ICD-10-CM | POA: Insufficient documentation

## 2013-08-10 DIAGNOSIS — R059 Cough, unspecified: Secondary | ICD-10-CM

## 2013-08-10 DIAGNOSIS — E7800 Pure hypercholesterolemia, unspecified: Secondary | ICD-10-CM

## 2013-08-10 DIAGNOSIS — C50919 Malignant neoplasm of unspecified site of unspecified female breast: Secondary | ICD-10-CM | POA: Insufficient documentation

## 2013-08-10 DIAGNOSIS — Z79899 Other long term (current) drug therapy: Secondary | ICD-10-CM | POA: Insufficient documentation

## 2013-08-10 DIAGNOSIS — C50319 Malignant neoplasm of lower-inner quadrant of unspecified female breast: Secondary | ICD-10-CM

## 2013-08-10 LAB — CBC WITH DIFFERENTIAL/PLATELET
BASO%: 0.5 % (ref 0.0–2.0)
Basophils Absolute: 0 10*3/uL (ref 0.0–0.1)
EOS%: 16.7 % — ABNORMAL HIGH (ref 0.0–7.0)
Eosinophils Absolute: 0.3 10*3/uL (ref 0.0–0.5)
HCT: 37.5 % (ref 34.8–46.6)
HGB: 12.7 g/dL (ref 11.6–15.9)
LYMPH%: 57.1 % — ABNORMAL HIGH (ref 14.0–49.7)
MCH: 27.8 pg (ref 25.1–34.0)
MCHC: 33.9 g/dL (ref 31.5–36.0)
MCV: 82.1 fL (ref 79.5–101.0)
MONO#: 0.3 10*3/uL (ref 0.1–0.9)
MONO%: 13.6 % (ref 0.0–14.0)
NEUT#: 0.2 10*3/uL — CL (ref 1.5–6.5)
NEUT%: 12.1 % — ABNORMAL LOW (ref 38.4–76.8)
Platelets: 188 10*3/uL (ref 145–400)
RBC: 4.57 10*6/uL (ref 3.70–5.45)
RDW: 13 % (ref 11.2–14.5)
WBC: 2 10*3/uL — ABNORMAL LOW (ref 3.9–10.3)
lymph#: 1.1 10*3/uL (ref 0.9–3.3)
nRBC: 0 % (ref 0–0)

## 2013-08-10 NOTE — Telephone Encounter (Signed)
Spoke with pharmacist about prescription.  Ordered Amoxicillin 1G TID x7day #42 with no refills.  Patient is aware of prescription refill.

## 2013-08-11 ENCOUNTER — Encounter: Payer: Self-pay | Admitting: *Deleted

## 2013-08-11 NOTE — [facility] Oncology Navigator Note (Signed)
Late entry:  Patient at [facility] for follow up visit after first chemotherapy treatment on 08/10/13.  Patient reports that she is doing well.  She reports that she had minimal nausea a couple times following her treatment and is experiencing some fatigue.  She has also had a sinus infection for which she saw her PCP and is on antibiotics.  I updated patient regarding information she requested about obtaining a wig and I gave her a voucher for a wig here at [facility].  I also gave her information about signing up for a table massage offered here at [facility].  Patient denied any other questions or concerns at this time.  I encouraged her to call me for any needs.

## 2013-08-17 ENCOUNTER — Ambulatory Visit (HOSPITAL_BASED_OUTPATIENT_CLINIC_OR_DEPARTMENT_OTHER): Payer: BC Managed Care – PPO | Admitting: Hematology and Oncology

## 2013-08-17 ENCOUNTER — Ambulatory Visit (HOSPITAL_BASED_OUTPATIENT_CLINIC_OR_DEPARTMENT_OTHER): Payer: BC Managed Care – PPO

## 2013-08-17 ENCOUNTER — Telehealth: Payer: Self-pay | Admitting: *Deleted

## 2013-08-17 ENCOUNTER — Other Ambulatory Visit: Payer: Self-pay

## 2013-08-17 ENCOUNTER — Other Ambulatory Visit: Payer: BC Managed Care – PPO

## 2013-08-17 ENCOUNTER — Other Ambulatory Visit (HOSPITAL_BASED_OUTPATIENT_CLINIC_OR_DEPARTMENT_OTHER): Payer: BC Managed Care – PPO

## 2013-08-17 ENCOUNTER — Other Ambulatory Visit: Payer: Self-pay | Admitting: *Deleted

## 2013-08-17 ENCOUNTER — Ambulatory Visit: Payer: BC Managed Care – PPO

## 2013-08-17 VITALS — BP 127/62 | HR 86 | Temp 98.2°F | Resp 18 | Ht 64.5 in | Wt 202.9 lb

## 2013-08-17 DIAGNOSIS — D702 Other drug-induced agranulocytosis: Secondary | ICD-10-CM

## 2013-08-17 DIAGNOSIS — C50319 Malignant neoplasm of lower-inner quadrant of unspecified female breast: Secondary | ICD-10-CM

## 2013-08-17 DIAGNOSIS — Z170 Estrogen receptor positive status [ER+]: Secondary | ICD-10-CM

## 2013-08-17 DIAGNOSIS — Z5111 Encounter for antineoplastic chemotherapy: Secondary | ICD-10-CM

## 2013-08-17 DIAGNOSIS — R059 Cough, unspecified: Secondary | ICD-10-CM

## 2013-08-17 DIAGNOSIS — C50919 Malignant neoplasm of unspecified site of unspecified female breast: Secondary | ICD-10-CM

## 2013-08-17 DIAGNOSIS — J329 Chronic sinusitis, unspecified: Secondary | ICD-10-CM

## 2013-08-17 DIAGNOSIS — C50312 Malignant neoplasm of lower-inner quadrant of left female breast: Secondary | ICD-10-CM

## 2013-08-17 DIAGNOSIS — Z95828 Presence of other vascular implants and grafts: Secondary | ICD-10-CM

## 2013-08-17 DIAGNOSIS — R05 Cough: Secondary | ICD-10-CM

## 2013-08-17 DIAGNOSIS — R509 Fever, unspecified: Secondary | ICD-10-CM

## 2013-08-17 LAB — CBC WITH DIFFERENTIAL/PLATELET
BASO%: 1.3 % (ref 0.0–2.0)
Basophils Absolute: 0.1 10*3/uL (ref 0.0–0.1)
EOS%: 1.4 % (ref 0.0–7.0)
Eosinophils Absolute: 0.2 10*3/uL (ref 0.0–0.5)
HCT: 37.8 % (ref 34.8–46.6)
HGB: 12.4 g/dL (ref 11.6–15.9)
LYMPH%: 22.3 % (ref 14.0–49.7)
MCH: 27.3 pg (ref 25.1–34.0)
MCHC: 32.8 g/dL (ref 31.5–36.0)
MCV: 83.3 fL (ref 79.5–101.0)
MONO#: 1 10*3/uL — ABNORMAL HIGH (ref 0.1–0.9)
MONO%: 9 % (ref 0.0–14.0)
NEUT#: 7 10*3/uL — ABNORMAL HIGH (ref 1.5–6.5)
NEUT%: 66 % (ref 38.4–76.8)
Platelets: 213 10*3/uL (ref 145–400)
RBC: 4.54 10*6/uL (ref 3.70–5.45)
RDW: 13.5 % (ref 11.2–14.5)
WBC: 10.6 10*3/uL — ABNORMAL HIGH (ref 3.9–10.3)
lymph#: 2.4 10*3/uL (ref 0.9–3.3)

## 2013-08-17 LAB — COMPREHENSIVE METABOLIC PANEL (CC13)
ALT: 42 U/L (ref 0–55)
AST: 20 U/L (ref 5–34)
Albumin: 4 g/dL (ref 3.5–5.0)
Alkaline Phosphatase: 95 U/L (ref 40–150)
Anion Gap: 10 mEq/L (ref 3–11)
BUN: 9.7 mg/dL (ref 7.0–26.0)
CO2: 24 mEq/L (ref 22–29)
Calcium: 9.8 mg/dL (ref 8.4–10.4)
Chloride: 106 mEq/L (ref 98–109)
Creatinine: 0.8 mg/dL (ref 0.6–1.1)
Glucose: 150 mg/dl — ABNORMAL HIGH (ref 70–140)
Potassium: 4.4 mEq/L (ref 3.5–5.1)
Sodium: 140 mEq/L (ref 136–145)
Total Bilirubin: 0.35 mg/dL (ref 0.20–1.20)
Total Protein: 7.1 g/dL (ref 6.4–8.3)

## 2013-08-17 MED ORDER — DEXAMETHASONE SODIUM PHOSPHATE 20 MG/5ML IJ SOLN
INTRAMUSCULAR | Status: AC
  Filled 2013-08-17: qty 5

## 2013-08-17 MED ORDER — SODIUM CHLORIDE 0.9 % IJ SOLN
10.0000 mL | INTRAMUSCULAR | Status: DC | PRN
  Administered 2013-08-17: 10 mL
  Filled 2013-08-17: qty 10

## 2013-08-17 MED ORDER — SODIUM CHLORIDE 0.9 % IV SOLN
150.0000 mg | Freq: Once | INTRAVENOUS | Status: AC
  Administered 2013-08-17: 150 mg via INTRAVENOUS
  Filled 2013-08-17: qty 5

## 2013-08-17 MED ORDER — SODIUM CHLORIDE 0.9 % IV SOLN
Freq: Once | INTRAVENOUS | Status: AC
  Administered 2013-08-17: 12:00:00 via INTRAVENOUS

## 2013-08-17 MED ORDER — PALONOSETRON HCL INJECTION 0.25 MG/5ML
0.2500 mg | Freq: Once | INTRAVENOUS | Status: AC
  Administered 2013-08-17: 0.25 mg via INTRAVENOUS

## 2013-08-17 MED ORDER — HEPARIN SOD (PORK) LOCK FLUSH 100 UNIT/ML IV SOLN
500.0000 [IU] | Freq: Once | INTRAVENOUS | Status: AC | PRN
  Administered 2013-08-17: 500 [IU]
  Filled 2013-08-17: qty 5

## 2013-08-17 MED ORDER — PALONOSETRON HCL INJECTION 0.25 MG/5ML
INTRAVENOUS | Status: AC
Start: 2013-08-17 — End: 2013-08-17
  Filled 2013-08-17: qty 5

## 2013-08-17 MED ORDER — SODIUM CHLORIDE 0.9 % IJ SOLN
10.0000 mL | INTRAMUSCULAR | Status: DC | PRN
  Administered 2013-08-17: 10 mL via INTRAVENOUS
  Filled 2013-08-17: qty 10

## 2013-08-17 MED ORDER — HEPARIN SOD (PORK) LOCK FLUSH 100 UNIT/ML IV SOLN
500.0000 [IU] | Freq: Once | INTRAVENOUS | Status: AC
  Administered 2013-08-17: 500 [IU] via INTRAVENOUS
  Filled 2013-08-17: qty 5

## 2013-08-17 MED ORDER — CYCLOPHOSPHAMIDE CHEMO INJECTION 1 GM
600.0000 mg/m2 | Freq: Once | INTRAMUSCULAR | Status: AC
  Administered 2013-08-17: 1240 mg via INTRAVENOUS
  Filled 2013-08-17: qty 62

## 2013-08-17 MED ORDER — DEXAMETHASONE SODIUM PHOSPHATE 20 MG/5ML IJ SOLN
12.0000 mg | Freq: Once | INTRAMUSCULAR | Status: AC
  Administered 2013-08-17: 20 mg via INTRAVENOUS

## 2013-08-17 MED ORDER — DOXORUBICIN HCL CHEMO IV INJECTION 2 MG/ML
60.0000 mg/m2 | Freq: Once | INTRAVENOUS | Status: AC
  Administered 2013-08-17: 124 mg via INTRAVENOUS
  Filled 2013-08-17: qty 62

## 2013-08-17 NOTE — Progress Notes (Signed)
[place]  [phone] [phone]     ID: Debbie [name] OB: 08/09/1965  MR#: [id number]  EML#:[id number]  PCP: Debbie Schwalbe, MD GYN:  Debbie [name] SU: Debbie [name] OTHER MD: Debbie [name], De Nurse  CHIEF COMPLAINT: Cough,reported fever  HISTORY OF PRESENT ILLNESS: Debbie [name] had routine yearly screening mammography at the breast center 06/19/2013 showing a possible mass in the left breast. The right breast was unremarkable. On 06/30/2013 additional views of the left breast showed an irregular spiculated dense mass measuring 2.2 cm in the lower inner quadrant. This was palpable at the 8:00 location. Ultrasound showed an irregular hypoechoic mass measuring 2.3 cm and in the left axilla a dominant abnormal appearing lymph node with cortical thickening measuring 1.2 cm.  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  [id number] the patient underwent bilateral breast MRI. This showed her breasts to be dense (composition C.). In the left breast at the 8:00 position there was a 2.4 cm enhancing mass containing a biopsy clip. There were no additional areas of concern. In the axilla on the left there was a 2.6 cm enlarged left axillary lymph node. There was also a 0.5 cm left internal mammary lymph nodes noted. The right side was unremarkable.  The patient's subsequent history is as detailed below.  INTERVAL HISTORY: Debbie [name] returns today for followup of her breast cancer.Patient recently had a second opinion visit at [place].Patient reports feeling fair.she  notes increasing cough non productive and fever up to 99.8 and 99.6 yesterday.she denies hemoptysis or chest pain.She is currently on Amoxicillin prescribed by her Primary MD for sinus infection 1000 mg 2 times daily.She denies nasal drainage. Patient has tolerated the chemotherapy overall well with some nausea and weakness ,bone pain following the Neulasta now improving.Reports constipation problems.   REVIEW OF SYSTEMS: l but otherwise a detailed review of systems today was entirely negative.  PAST MEDICAL HISTORY: Past Medical History  Diagnosis Date  . MVP (mitral valve prolapse)   . IBS (irritable bowel syndrome)   . Migraines     menstral  . Hyperlipidemia   . MVP (mitral valve prolapse)   . Migraines   . IBS (irritable bowel syndrome)   . Abnormal Pap smear   . Seasonal allergies   . Anxiety   . Menorrhagia   . Breast cancer 2015    ER+/PR+/Her2-    PAST SURGICAL HISTORY: Past Surgical History  Procedure Laterality Date  . Wisdom teeth removal    . Vulva /perineum biopsy  2011  . Svd       x 3  . Endometrial ablation    . Portacath placement Left 07/18/2013    Procedure: INSERTION PORT-A-CATH;  Surgeon: Debbie Klein, MD;  Location: [facility] OR;  Service: General;  Laterality: Left;    FAMILY HISTORY Family History  Problem Relation Age of Onset  . Cancer Maternal Grandmother     BREAST AND UTERINE CANCER  . Uterine cancer Maternal Grandmother 84  . Hyperlipidemia Mother   . Hypertension Mother   . Multiple myeloma Father 73  . Lung cancer Maternal Grandfather  welder  . Testicular cancer Other 12  The patient's father is living, currently age 5. He has a history of multiple myeloma. The patient's mother is 67. The patient's parents live in [place] The patient had no brothers, 2 sisters. There is no history of breast or ovarian cancer in the family. She believes one of her grandfathers died from lung cancer and another from pancreatic cancer, but is not  sure  GYNECOLOGIC HISTORY:  Menarche age 77, first live birth age 64, which the patient understands is an independent risk factor for breast cancer. She is GX P3. She still having regular periods. 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 Her husband Debbie [name] is a Energy manager at [employer]. Her 3 children are Debbie [name] (14), Debbie [name] (10) and Debbie [name] (7). The patient attends a local [employer]: Not in place   HEALTH MAINTENANCE: History  Substance Use Topics  . Smoking status: Never Smoker   . Smokeless tobacco: Never Used  . Alcohol Use: No     Colonoscopy: January 2015  FXT:[id number] 2014  Bone density:  Lipid panel:  Allergies  Allergen Reactions  . Dust Mite Extract Itching    Current Outpatient Prescriptions  Medication Sig Dispense Refill  . ALPRAZolam (XANAX) 0.5 MG tablet       . amoxicillin (AMOXIL) 500 MG capsule       . atorvastatin (LIPITOR) 20 MG tablet Take 20 mg by mouth daily.      Marland Kitchen azithromycin (ZITHROMAX) 250 MG tablet       . cetirizine (ZYRTEC) 10 MG tablet Take 10 mg by mouth daily.      Marland Kitchen dexamethasone (DECADRON) 4 MG tablet Take 2 tablets by mouth once a day on the day after chemotherapy and then take 2 tablets two times a day for 2 days. Take with food.  30 tablet  1  . FROVA 2.5 MG tablet Take 2.5 mg by mouth daily as needed for migraine.       Marland Kitchen HYDROcodone-homatropine (HYCODAN) 5-1.5 MG/5ML syrup       . lidocaine-prilocaine (EMLA) cream Apply 1 application topically as needed. Apply over port site 1-2 hours before chemotherapy and cover with plastic wrap  30 g  0  . LORazepam (ATIVAN) 0.5 MG tablet Take 1 tablet (0.5 mg total) by mouth at bedtime as needed (Nausea or vomiting).  30 tablet  0  . mometasone (NASONEX) 50 MCG/ACT nasal spray Place 2 sprays into the nose daily as needed (for allergies).       . ondansetron (ZOFRAN)  8 MG tablet Take 1 tablet (8 mg total) by mouth 2 (two) times daily as needed. Start on the third day after chemotherapy.  30 tablet  1  . oxyCODONE-acetaminophen (PERCOCET/ROXICET) 5-325 MG per tablet       . oxymetazoline (12 HOUR NASAL SPRAY) 0.05 % nasal spray 2 sprays by Nasal route as needed.      Marland Kitchen oxymetazoline (AFRIN) 0.05 % nasal spray Place 2 sprays into the nose daily as needed. congestion      . PARoxetine (PAXIL) 20 MG tablet Take 20 mg by mouth every morning.      . polyethylene glycol-electrolytes (NULYTELY/GOLYTELY) 420 G solution       . predniSONE (DELTASONE) 20 MG tablet       . prochlorperazine (COMPAZINE) 10 MG tablet Take 1  tablet (10 mg total) by mouth every 6 (six) hours as needed (Nausea or vomiting).  30 tablet  1  . Vitamin D, Ergocalciferol, (DRISDOL) 50000 UNITS CAPS capsule Take 50,000 Units by mouth every 7 (seven) days.        No current facility-administered medications for this visit.    OBJECTIVE: Middle-aged white woman in no acute distress  There were no vitals filed for this visit.   There is no weight on file to calculate BMI.    ECOG FS:0 - Asymptomatic  Sclerae unicteric, pupils [name] and equal Oropharynx clear and moist No cervical or supraclavicular adenopathy Lungs no rales or rhonchi few dry rales at the bases Heart regular rate and rhythm Abd soft, nontender, positive bowel sounds MSK no focal spinal tenderness, no upper extremity lymphedema Neuro: nonfocal, well oriented, appropriate affect Breasts: the right breast is unremarkable. The mass in the lower inner quadrant of the left breast is palpable non tender There is no skin change or nipple retraction. I do not palpate any left axillary adenopathy. Skin: The port is in the left upper chest wall is easily palpable. There is no erythema or swelling.  LAB RESULTS:  CMP     Component Value Date/Time   NA 139 07/12/2013 1229   K 4.9 07/12/2013 1229   CO2 25 07/12/2013 1229   GLUCOSE 105  07/12/2013 1229   BUN 11.0 07/12/2013 1229   CREATININE 0.7 07/12/2013 1229   CALCIUM 9.9 07/12/2013 1229   PROT 7.2 07/12/2013 1229   ALBUMIN 4.3 07/12/2013 1229   AST 18 07/12/2013 1229   ALT 22 07/12/2013 1229   ALKPHOS 79 07/12/2013 1229   BILITOT 0.45 07/12/2013 1229    I   Lab Results  Component Value Date   WBC 2.0* 08/10/2013   NEUTROABS 0.2* 08/10/2013   HGB 12.7 08/10/2013   HCT 37.5 08/10/2013   MCV 82.1 08/10/2013   PLT 188 08/10/2013      Chemistry      Component Value Date/Time   NA 139 07/12/2013 1229   K 4.9 07/12/2013 1229   CO2 25 07/12/2013 1229   BUN 11.0 07/12/2013 1229   CREATININE 0.7 07/12/2013 1229      Component Value Date/Time   CALCIUM 9.9 07/12/2013 1229   ALKPHOS 79 07/12/2013 1229   AST 18 07/12/2013 1229   ALT 22 07/12/2013 1229   BILITOT 0.45 07/12/2013 1229        STUDIES:  EXAM:  NUCLEAR MEDICINE PET SKULL BASE TO THIGH  FASTING BLOOD GLUCOSE: Value: 109 mg/dl  TECHNIQUE:  10.9 mCi F-18 FDG was injected intravenously. Full-ring PET imaging  was performed from the skull base to thigh after the radiotracer. CT  data was obtained and used for attenuation correction and anatomic  localization.  COMPARISON: Chest CT done today  FINDINGS:  NECK  No hypermetabolic cervical lymph nodes are identified.There are no  lesions of the pharyngeal mucosal space. There is symmetric activity  within the lymphoid tissue of Waldeyer's ring, within physiologic  limits.  CHEST  Hypermetabolic lesion medially in the left breast demonstrates an  SUV max of 17.7. There is a dominant hypermetabolic left axillary  lymph node with an SUV max of 8.6. There are no hypermetabolic  internal mammary, mediastinal or hilar lymph nodes. There is no  suspicious pulmonary activity. CT findings are deferred to the  diagnostic study of the same date.  ABDOMEN/PELVIS  There is no hypermetabolic activity within the liver, adrenal  glands, spleen or pancreas. There is no  hypermetabolic nodal  activity. There is low density throughout the liver consistent with  steatosis.  SKELETON  There is no hypermetabolic activity to suggest osseous metastatic  disease.  IMPRESSION:  1. Known left breast cancer is hypermetabolic. There is a dominant  hypermetabolic left axillary lymph nodal metastasis.  2. No other evidence of metastatic disease.  Electronically Signed  By: Camie Patience M.D.  On: 07/17/2013 08:22        Transthoracic Echocardiography  Patient: Marry, Kusch MR #: [id number] Study Date: 07/21/2013 Gender: F Age: 48 Height: 165.1cm Weight: 90.7kg BSA: 1.53m2 Pt. Status: Room:  ORDERING Magrinat, GHazle NordmannREFERRING Magrinat, GHazle NordmannREFERRING SAntony ContrasATTENDING BGlori BickersSONOGRAPHER Jimmy Reel PERFORMING [facility], Outpatient cc:  ------------------------------------------------------------ LV EF: 60% - 65%  ------------------------------------------------------------  ASSESSMENT: 48y.o. [place] woman status post left breast and left axillary lymph node biopsy 06/30/2013 for a clinical T2 N1-2, stage IIB/IIIA invasive ductal carcinoma, grade 2, estrogen and progesterone receptors both strongly positive, HER-2 nonamplified, with an MIB-1 of 36%.  (1) there is a 5 mm left internal mammary lymph node which was negative on PET scan  (2) cycle# 1  cyclophosphamide and doxorubicin  08/04/2014, given in dose dense fashion x4 planned with Neulasta day 2; to be followed by paclitaxel   PLAN: Breast cancer as above Patient today neutropenic following Neulasta with potential source sinus infection. Will obtain chest xray today. Continue on Amoxicillin dose increased to 1000 mg 3 times daily and given additional supply. Discussed again neutropenic precautions.Patient to call for recurrent fever over 100.  Discussed constipation,stool softeners,avoid sup when neutropenic,high fiber diet,take probiotic  Follow up  On  08/17/13 for cycle #2 AC with cbc and cmp.  .  All  Treatments are entered, labs, chemotherapy  and visits through April 23, when she will see Dr.Magrinat again. At that point plan to start  the second part of her chemotherapy, and she will have a choice of weekly Taxol x12 or Taxol every 2 weeks x4.  Patient has a good understanding of the overall plan. She agrees with that. She notes a goal of treatment in her cases cure. She will call with any problems that may develop before the next visit.   FAmada Kingfisher MD   08/17/2013 8:39 AM Oncology/Hematology

## 2013-08-17 NOTE — Progress Notes (Signed)
Debbie [name]  [phone]) [phone] [phone]) [phone]  ID: Debbie [name] OB: February 10, 1966 MR#: [id number] GYK#:[id number]  PCP: [name], MD  GYN: [name]  SU: [name]  OTHER MD: [name], De Nurse   CHIEF COMPLAINT:  Chief Complaint  Patient presents with  . Breast Cancer  . Follow-up  . Chemotherapy  . Sinusitis     HISTORY OF PRESENT ILLNESS:  Bree had routine yearly screening mammography at the breast center 06/19/2013 showing a possible mass in the left breast. The right breast was unremarkable. On 06/30/2013 additional views of the left breast showed an irregular spiculated dense mass measuring 2.2 cm in the lower inner quadrant. This was palpable at the 8:00 location. Ultrasound showed an irregular hypoechoic mass measuring 2.3 cm and in the left axilla a dominant abnormal appearing lymph node with cortical thickening measuring 1.2 cm.  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  [id number] the patient underwent bilateral breast MRI. This showed her breasts to be dense (composition C.). In the left breast at the 8:00 position there was a 2.4 cm enhancing mass containing a biopsy clip. There were no additional areas of concern. In the axilla on the left there was a 2.6 cm enlarged left axillary lymph node. There was also a 0.5 cm left internal mammary lymph nodes noted. The right side was unremarkable.  The patient's subsequent history is as detailed below.   INTERVAL HISTORY:  [name] returns today for followup of her breast cancer.Patient recently had a  second opinion visit at [facility].Patient reports feeling better. Patient had developed cough with nasal congestion and fever while neutropenic.she was treated with oral Amoxicillin and the dose and duration of treatment was increased to reflect treatment for sinusitis. Today she feels beter.Nasal congestion is improved with cough,denies fever,still on amoxicillin  Patient has tolerated the chemotherapy overall well with some nausea and weakness ,bone pain following the Neulasta now improving.Reports constipation problems.  REVIEW OF SYSTEMS:  l but otherwise a detailed review of systems today was entirely negative.      CLINICAL DATA: History of left breast cancer on chemotherapy, cough  and congestion with low-grade fever, possible pneumonia  EXAM:  CHEST 2 VIEW  COMPARISON: DG CHEST 1V PORT dated 07/18/2013  FINDINGS:  The heart size and mediastinal contours are within normal limits.  Both lungs are clear. The visualized skeletal structures are  unremarkable. Left Port-A-Cath in unchanged position.  IMPRESSION:  No active cardiopulmonary disease.  Electronically Signed  By: Skipper Cliche M.D.  On: 08/10/2013 14:52      Past Medical History   Diagnosis  Date   .  MVP (mitral valve prolapse)    .  IBS (irritable bowel syndrome)    .  Migraines      menstral   .  Hyperlipidemia    .  MVP (mitral valve prolapse)    .  Migraines    .  IBS (irritable bowel syndrome)    .  Abnormal Pap smear    .  Seasonal allergies    .  Anxiety    .  Menorrhagia    .  Breast cancer  2015     ER+/PR+/Her2-   PAST SURGICAL HISTORY:  Past Surgical History   Procedure  Laterality  Date   .  Wisdom teeth removal     .  Vulva /perineum biopsy   2011   .  Svd       x 3   .  Endometrial ablation     .  Portacath placement  Left  07/18/2013     Procedure: INSERTION PORT-A-CATH; Surgeon: [name], MD; Location: [facility] OR; Service: General; Laterality: Left;   FAMILY HISTORY  Family  History   Problem  Relation  Age of Onset   .  Cancer  Maternal Grandmother      BREAST AND UTERINE CANCER   .  Uterine cancer  Maternal Grandmother  84   .  Hyperlipidemia  Mother    .  Hypertension  Mother    .  Multiple myeloma  Father  71   .  Lung cancer  Maternal Restaurant manager, fast food   .  Testicular cancer  Other  29   The patient's father is living, currently age 34. He has a history of multiple myeloma. The patient's mother is 81. The patient's parents live in [place] The patient had no brothers, 2 sisters. There is no history of breast or ovarian cancer in the family. She believes one of her grandfathers died from lung cancer and another from pancreatic cancer, but is not sure  GYNECOLOGIC HISTORY:  Menarche age 48, first live birth age 31, which the patient understands is an independent risk factor for breast cancer. She is GX P3. She still having regular periods. 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 Her husband [name] is a Energy manager at [employer]. Her 3 children are [name] (14), [name] (10) and [name] (7). The patient attends a local [employer]: Not in place  HEALTH MAINTENANCE:  History   Substance Use Topics   .  Smoking status:  Never Smoker   .  Smokeless tobacco:  Never Used   .  Alcohol Use:  No   Colonoscopy: January 2015  ZTI:WPYKDXIP 2014  Bone density:  Lipid panel:  Allergies   Allergen  Reactions   .  Dust Mite Extract  Itching    Current Outpatient Prescriptions   Medication  Sig  Dispense  Refill   .  ALPRAZolam (XANAX) 0.5 MG tablet      .  amoxicillin (AMOXIL) 500 MG capsule      .  atorvastatin (LIPITOR) 20 MG tablet  Take 20 mg by mouth daily.     Marland Kitchen  azithromycin (ZITHROMAX) 250 MG tablet      .  cetirizine (ZYRTEC) 10 MG tablet  Take 10 mg by mouth daily.     Marland Kitchen  dexamethasone (DECADRON) 4 MG tablet  Take  2 tablets by mouth once a day on the day after chemotherapy and then take 2 tablets two times a day for 2 days. Take with food.  30 tablet  1   .  FROVA 2.5 MG tablet  Take 2.5 mg by mouth daily as needed for migraine.     Marland Kitchen  HYDROcodone-homatropine (HYCODAN) 5-1.5 MG/5ML syrup      .  lidocaine-prilocaine (EMLA) cream  Apply 1 application topically as needed. Apply over  port site 1-2 hours before chemotherapy and cover with plastic wrap  30 g  0   .  LORazepam (ATIVAN) 0.5 MG tablet  Take 1 tablet (0.5 mg total) by mouth at bedtime as needed (Nausea or vomiting).  30 tablet  0   .  mometasone (NASONEX) 50 MCG/ACT nasal spray  Place 2 sprays into the nose daily as needed (for allergies).     .  ondansetron (ZOFRAN) 8 MG tablet  Take 1 tablet (8 mg total) by mouth 2 (two) times daily as needed. Start on the third day after chemotherapy.  30 tablet  1   .  oxyCODONE-acetaminophen (PERCOCET/ROXICET) 5-325 MG per tablet      .  oxymetazoline (12 HOUR NASAL SPRAY) 0.05 % nasal spray  2 sprays by Nasal route as needed.     Marland Kitchen  oxymetazoline (AFRIN) 0.05 % nasal spray  Place 2 sprays into the nose daily as needed. congestion     .  PARoxetine (PAXIL) 20 MG tablet  Take 20 mg by mouth every morning.     .  polyethylene glycol-electrolytes (NULYTELY/GOLYTELY) 420 G solution      .  predniSONE (DELTASONE) 20 MG tablet      .  prochlorperazine (COMPAZINE) 10 MG tablet  Take 1 tablet (10 mg total) by mouth every 6 (six) hours as needed (Nausea or vomiting).  30 tablet  1   .  Vitamin D, Ergocalciferol, (DRISDOL) 50000 UNITS CAPS capsule  Take 50,000 Units by mouth every 7 (seven) days.      No current facility-administered medications for this visit.   OBJECTIVE: Middle-aged white woman in no acute distress  There were no vitals filed for this visit. There is no weight on file to calculate BMI. ECOG FS:0 - Asymptomatic  Sclerae unicteric, pupils round and equal  Oropharynx clear and moist  No cervical or  supraclavicular adenopathy  Lungs no rales or rhonchi few dry rales at the bases  Heart regular rate and rhythm  Abd soft, nontender, positive bowel sounds  MSK no focal spinal tenderness, no upper extremity lymphedema  Neuro: nonfocal, well oriented, appropriate affect  Breasts: the right breast is unremarkable. The mass in the lower inner quadrant of the left breast is palpable non tender There is no skin change or nipple retraction. I do not palpate any left axillary adenopathy.  Skin: The port is in the left upper chest wall is easily palpable. There is no erythema or swelling.  LAB RESULTS:  CMP  CBC    Component Value Date/Time   WBC 10.6* 08/17/2013 0951   WBC 9.9 09/25/2011 1110   RBC 4.54 08/17/2013 0951   RBC 4.16 09/25/2011 1110   HGB 12.4 08/17/2013 0951   HGB 11.5* 09/25/2011 1110   HCT 37.8 08/17/2013 0951   HCT 36.1 09/25/2011 1110   PLT 213 08/17/2013 0951   PLT 364 09/25/2011 1110   MCV 83.3 08/17/2013 0951   MCV 86.8 09/25/2011 1110   MCH 27.3 08/17/2013 0951   MCH 27.6 09/25/2011 1110   MCHC 32.8 08/17/2013 0951   MCHC 31.9 09/25/2011 1110   RDW 13.5 08/17/2013 0951   RDW 14.2 09/25/2011 1110   LYMPHSABS 2.4 08/17/2013 0951   MONOABS 1.0* 08/17/2013 0951   EOSABS 0.2 08/17/2013 0951   BASOSABS 0.1 08/17/2013 0951    CMP     Component Value Date/Time   NA 140 08/17/2013 0953   K 4.4 08/17/2013 0953   CO2 24 08/17/2013 0953  GLUCOSE 150* 08/17/2013 0953   BUN 9.7 08/17/2013 0953   CREATININE 0.8 08/17/2013 0953   CALCIUM 9.8 08/17/2013 0953   PROT 7.1 08/17/2013 0953   ALBUMIN 4.0 08/17/2013 0953   AST 20 08/17/2013 0953   ALT 42 08/17/2013 0953   ALKPHOS 95 08/17/2013 0953   BILITOT 0.35 08/17/2013 0953    Chemistry       Component  Value  Date/Time  Component  Value  Date/Time    NA  139  07/12/2013 1229   CALCIUM  9.9  07/12/2013 1229    K  4.9  07/12/2013 1229   ALKPHOS  79  07/12/2013 1229    CO2  25  07/12/2013 1229   AST  18  07/12/2013 1229    BUN  11.0  07/12/2013 1229    ALT  22  07/12/2013 1229    CREATININE  0.7  07/12/2013 1229   BILITOT  0.45  07/12/2013 1229   STUDIES:  EXAM:  NUCLEAR MEDICINE PET SKULL BASE TO THIGH  FASTING BLOOD GLUCOSE: Value: 109 mg/dl  TECHNIQUE:  10.9 mCi F-18 FDG was injected intravenously. Full-ring PET imaging  was performed from the skull base to thigh after the radiotracer. CT  data was obtained and used for attenuation correction and anatomic  localization.  COMPARISON: Chest CT done today  FINDINGS:  NECK  No hypermetabolic cervical lymph nodes are identified.There are no  lesions of the pharyngeal mucosal space. There is symmetric activity  within the lymphoid tissue of Waldeyer's ring, within physiologic  limits.  CHEST  Hypermetabolic lesion medially in the left breast demonstrates an  SUV max of 17.7. There is a dominant hypermetabolic left axillary  lymph node with an SUV max of 8.6. There are no hypermetabolic  internal mammary, mediastinal or hilar lymph nodes. There is no  suspicious pulmonary activity. CT findings are deferred to the  diagnostic study of the same date.  ABDOMEN/PELVIS  There is no hypermetabolic activity within the liver, adrenal  glands, spleen or pancreas. There is no hypermetabolic nodal  activity. There is low density throughout the liver consistent with  steatosis.  SKELETON  There is no hypermetabolic activity to suggest osseous metastatic  disease.  IMPRESSION:  1. Known left breast cancer is hypermetabolic. There is a dominant  hypermetabolic left axillary lymph nodal metastasis.  2. No other evidence of metastatic disease.  Electronically Signed  By: Camie Patience M.D.  On: 07/17/2013 08:22      Transthoracic Echocardiography  Patient: Mikela, Senn MR #: [id number] Study Date: 07/21/2013 Gender: F Age: 74 Height: 165.1cm Weight: 90.7kg BSA: 1.34m2 Pt. Status: Room:  ORDERING Magrinat, GHazle NordmannREFERRING Magrinat, GHazle NordmannREFERRING SAntony ContrasATTENDING BGlori BickersSONOGRAPHER Jimmy Reel PERFORMING [facility], Outpatient cc:  ------------------------------------------------------------ LV EF: 60% - 65%  ------------------------------------------------------------  ASSESSMENT:  48y.o. [place] woman status post left breast and left axillary lymph node biopsy 06/30/2013 for a clinical T2 N1-2, stage IIB/IIIA invasive ductal carcinoma, grade 2, estrogen and progesterone receptors both strongly positive, HER-2 nonamplified, with an MIB-1 of 36%.  (1) there is a 5 mm left internal mammary lymph node which was negative on PET scan  (2) cycle# 1 cyclophosphamide and doxorubicin 08/04/2014, given in dose dense fashion x4 planned with Neulasta day 2; to be followed by paclitaxel   PLAN:  Breast cancer as above    08/17/2013   cycle#2 AC.  chest xray negative   Sinus infection. Continue on Amoxicillin dose increased  to 1000 mg 3 times daily to finish current supply.   Follow up in 1 week with labs.Call for temp 99.8  Follow up in 2 weeks for cycle #3 AC.   Marland Kitchen  All Treatments are entered, labs, chemotherapy and visits through April 23, when she will see Dr.Magrinat again. At that point plan to start the second part of her chemotherapy, and she will have a choice of weekly Taxol x12 or Taxol every 2 weeks x4.  Patient has a good understanding of the overall plan. She agrees with that. She notes a goal of treatment in her cases cure. She will call with any problems that may develop before the next visit.   Amada Kingfisher, MD 08/17/2013 8:39 AM  Oncology/Hematology

## 2013-08-17 NOTE — Telephone Encounter (Signed)
Per staff message and POF I have scheduled appts.  JMW  

## 2013-08-17 NOTE — Patient Instructions (Signed)
[facility] Discharge Instructions for Patients Receiving Chemotherapy  Today you received the following chemotherapy agents Adriamycin and Cytoxan.  To help prevent nausea and vomiting after your treatment, we encourage you to take your nausea medication.   If you develop nausea and vomiting that is not controlled by your nausea medication, call the clinic.   BELOW ARE SYMPTOMS THAT SHOULD BE REPORTED IMMEDIATELY:  *FEVER GREATER THAN 100.5 F  *CHILLS WITH OR WITHOUT FEVER  NAUSEA AND VOMITING THAT IS NOT CONTROLLED WITH YOUR NAUSEA MEDICATION  *UNUSUAL SHORTNESS OF BREATH  *UNUSUAL BRUISING OR BLEEDING  TENDERNESS IN MOUTH AND THROAT WITH OR WITHOUT PRESENCE OF ULCERS  *URINARY PROBLEMS  *BOWEL PROBLEMS  UNUSUAL RASH Items with * indicate a potential emergency and should be followed up as soon as possible.  Feel free to call the clinic you have any questions or concerns. The clinic phone number is [phone].    

## 2013-08-18 ENCOUNTER — Ambulatory Visit (HOSPITAL_BASED_OUTPATIENT_CLINIC_OR_DEPARTMENT_OTHER): Payer: BC Managed Care – PPO

## 2013-08-18 VITALS — BP 131/67 | HR 89 | Temp 98.4°F

## 2013-08-18 DIAGNOSIS — C50312 Malignant neoplasm of lower-inner quadrant of left female breast: Secondary | ICD-10-CM

## 2013-08-18 DIAGNOSIS — D702 Other drug-induced agranulocytosis: Secondary | ICD-10-CM

## 2013-08-18 DIAGNOSIS — C50319 Malignant neoplasm of lower-inner quadrant of unspecified female breast: Secondary | ICD-10-CM

## 2013-08-18 MED ORDER — PEGFILGRASTIM INJECTION 6 MG/0.6ML
6.0000 mg | Freq: Once | SUBCUTANEOUS | Status: AC
  Administered 2013-08-18: 6 mg via SUBCUTANEOUS
  Filled 2013-08-18: qty 0.6

## 2013-08-18 NOTE — Patient Instructions (Signed)

## 2013-08-21 ENCOUNTER — Encounter: Payer: Self-pay | Admitting: *Deleted

## 2013-08-21 NOTE — [facility] Oncology Navigator Note (Signed)
Patient called to request a prescription for a wig.  She reports that she has been to have her head shaved this morning accompanied by a friend and is wearing her new wig.  She says that she tolerated her chemotherapy treatment last week without any difficulties.  She is experiencing some increased heartburn and is using antacids.  We discussed that this is a side effect of her chemo and to continue to use the antacids but if her symptoms become worse, to call us for other medications.  Patient denied any other questions or concerns at this time.  Prescription obtained and faxed.

## 2013-08-22 ENCOUNTER — Other Ambulatory Visit: Payer: Self-pay | Admitting: Hematology and Oncology

## 2013-08-22 ENCOUNTER — Encounter: Payer: Self-pay | Admitting: Hematology and Oncology

## 2013-08-24 ENCOUNTER — Ambulatory Visit (HOSPITAL_BASED_OUTPATIENT_CLINIC_OR_DEPARTMENT_OTHER): Payer: BC Managed Care – PPO | Admitting: Hematology and Oncology

## 2013-08-24 ENCOUNTER — Other Ambulatory Visit (HOSPITAL_BASED_OUTPATIENT_CLINIC_OR_DEPARTMENT_OTHER): Payer: BC Managed Care – PPO

## 2013-08-24 ENCOUNTER — Encounter: Payer: Self-pay | Admitting: Hematology and Oncology

## 2013-08-24 VITALS — BP 108/72 | HR 89 | Temp 98.8°F | Resp 18 | Ht 64.5 in | Wt 200.2 lb

## 2013-08-24 DIAGNOSIS — D709 Neutropenia, unspecified: Secondary | ICD-10-CM

## 2013-08-24 DIAGNOSIS — C50919 Malignant neoplasm of unspecified site of unspecified female breast: Secondary | ICD-10-CM

## 2013-08-24 DIAGNOSIS — R12 Heartburn: Secondary | ICD-10-CM

## 2013-08-24 DIAGNOSIS — Z170 Estrogen receptor positive status [ER+]: Secondary | ICD-10-CM

## 2013-08-24 DIAGNOSIS — C50319 Malignant neoplasm of lower-inner quadrant of unspecified female breast: Secondary | ICD-10-CM

## 2013-08-24 DIAGNOSIS — J329 Chronic sinusitis, unspecified: Secondary | ICD-10-CM

## 2013-08-24 DIAGNOSIS — C50312 Malignant neoplasm of lower-inner quadrant of left female breast: Secondary | ICD-10-CM

## 2013-08-24 LAB — CBC WITH DIFFERENTIAL/PLATELET
BASO%: 0.7 % (ref 0.0–2.0)
Basophils Absolute: 0 10*3/uL (ref 0.0–0.1)
EOS%: 0.7 % (ref 0.0–7.0)
Eosinophils Absolute: 0 10*3/uL (ref 0.0–0.5)
HCT: 34.5 % — ABNORMAL LOW (ref 34.8–46.6)
HGB: 11.7 g/dL (ref 11.6–15.9)
LYMPH%: 62.1 % — ABNORMAL HIGH (ref 14.0–49.7)
MCH: 27.5 pg (ref 25.1–34.0)
MCHC: 33.9 g/dL (ref 31.5–36.0)
MCV: 81 fL (ref 79.5–101.0)
MONO#: 0.3 10*3/uL (ref 0.1–0.9)
MONO%: 17.2 % — ABNORMAL HIGH (ref 0.0–14.0)
NEUT#: 0.3 10*3/uL — CL (ref 1.5–6.5)
NEUT%: 19.3 % — ABNORMAL LOW (ref 38.4–76.8)
Platelets: 186 10*3/uL (ref 145–400)
RBC: 4.26 10*6/uL (ref 3.70–5.45)
RDW: 13.4 % (ref 11.2–14.5)
WBC: 1.5 10*3/uL — ABNORMAL LOW (ref 3.9–10.3)
lymph#: 0.9 10*3/uL (ref 0.9–3.3)
nRBC: 0 % (ref 0–0)

## 2013-08-24 LAB — COMPREHENSIVE METABOLIC PANEL (CC13)
ALT: 22 U/L (ref 0–55)
AST: 11 U/L (ref 5–34)
Albumin: 3.7 g/dL (ref 3.5–5.0)
Alkaline Phosphatase: 97 U/L (ref 40–150)
Anion Gap: 10 mEq/L (ref 3–11)
BUN: 11.6 mg/dL (ref 7.0–26.0)
CO2: 22 mEq/L (ref 22–29)
Calcium: 9 mg/dL (ref 8.4–10.4)
Chloride: 108 mEq/L (ref 98–109)
Creatinine: 0.7 mg/dL (ref 0.6–1.1)
Glucose: 89 mg/dl (ref 70–140)
Potassium: 3.9 mEq/L (ref 3.5–5.1)
Sodium: 140 mEq/L (ref 136–145)
Total Bilirubin: 0.35 mg/dL (ref 0.20–1.20)
Total Protein: 6.6 g/dL (ref 6.4–8.3)

## 2013-08-24 MED ORDER — OMEPRAZOLE 20 MG PO CPDR: 20.0000 mg | DELAYED_RELEASE_CAPSULE | Freq: Two times a day (BID) | ORAL | Status: DC

## 2013-08-24 NOTE — Progress Notes (Signed)
. [place]  Telephone:([phone]) [phone] Fax:(336) [phone]  ID: Debbie [name] OB: [birth year] MR#: [id number]#:[id number]  PCP: Debbie Schwalbe, MD  GYN: Debbie [name]  SU: Debbie [name]  OTHER MD: Debbie [name], De Nurse   CHIEF COMPLAINT:  Chief Complaint  Patient presents with  . Breast Cancer  . Follow-up  . Chemotherapy  . Heartburn     HISTORY OF PRESENT ILLNESS:  Debbie [name] had routine yearly screening mammography at the breast center 06/19/2013 showing a possible mass in the left breast. The right breast was unremarkable. On 06/30/2013 additional views of the left breast showed an irregular spiculated dense mass measuring 2.2 cm in the lower inner quadrant. This was palpable at the 8:00 location. Ultrasound showed an irregular hypoechoic mass measuring 2.3 cm and in the left axilla a dominant abnormal appearing lymph node with cortical thickening measuring 1.2 cm.  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  [id number] the patient underwent bilateral breast MRI. This showed her breasts to be dense (composition C.). In the left breast at the 8:00 position there was a 2.4 cm enhancing mass containing a biopsy clip. There were no additional areas of concern. In the axilla on the left there was a 2.6 cm enlarged left axillary lymph node. There was also a 0.5 cm left internal mammary lymph nodes noted. The right side was unremarkable. Patient had a second opinion at [facility].  The patient's subsequent history is as detailed below.   INTERVAL HISTORY:   Cycle#2 [facility]  08/17/2013  Patient returns today for followup of her breast cancer ongoing chemotherapy..She reports feeling better. Patient had developed cough with nasal congestion and fever while neutropenic after cycle #1.She was treated with oral Amoxicillin and the dose and duration of treatment was increased to reflect treatment for sinusitis.Chest xray was negative. Today she feels beter.Nasal congestion is improved with cough,denies fever,still on amoxicillin to finish tomorrow.Cough is much improved still present.  Patient has tolerated the chemotherapy cycle #2  overall well with some nausea and weakness ,bone pain following the Neulasta now improving.Reports constipation better.  Main concern is heartburn after chemotherapy,difficulty sleeping and blurry vision    REVIEW OF SYSTEMS:  A detailed review of systems today was  Negative besides as above.      CLINICAL DATA: History of left breast cancer on chemotherapy, cough  and congestion with low-grade fever, possible pneumonia  EXAM:  CHEST 2 VIEW  COMPARISON: DG CHEST 1V PORT dated 07/18/2013  FINDINGS:  The heart size and mediastinal contours are within normal limits.  Both lungs are clear. The visualized skeletal structures are  unremarkable. Left Port-A-Cath in unchanged position.  IMPRESSION:  No active cardiopulmonary disease.  Electronically Signed  By: Debbie [name] M.D.  On: 08/10/2013 14:52      Past Medical History   Diagnosis  Date   .  MVP (mitral valve prolapse)    .  IBS (irritable bowel syndrome)    .  Migraines      menstral   .  Hyperlipidemia    .  MVP (mitral valve prolapse)    .  Migraines    .  IBS (irritable bowel syndrome)    .  Abnormal Pap smear    .  Seasonal allergies    .  Anxiety    .  Menorrhagia    .  Breast cancer  2015     ER+/PR+/Her2-   PAST SURGICAL HISTORY:  Past Surgical History   Procedure  Laterality  Date   .  Wisdom teeth removal     .  Vulva /perineum biopsy   2011   .  Svd        x 3   .  Endometrial ablation     .  Portacath placement  Left  07/18/2013     Procedure: INSERTION PORT-A-CATH; Surgeon: Debbie Klein, MD; Location: [facility] OR; Service: General; Laterality: Left;   FAMILY HISTORY  Family History   Problem  Relation  Age of Onset   .  Cancer  Maternal Grandmother      BREAST AND UTERINE CANCER   .  Uterine cancer  Maternal Grandmother  84   .  Hyperlipidemia  Mother    .  Hypertension  Mother    .  Multiple myeloma  Father  52   .  Lung cancer  Maternal Restaurant manager, fast food   .  Testicular cancer  Other  3   The patient's father is living, currently age 8. He has a history of multiple myeloma. The patient's mother is 81. The patient's parents live in [place] The patient had no brothers, 2 sisters. There is no history of breast or ovarian cancer in the family. She believes one of her grandfathers died from lung cancer and another from pancreatic cancer, but is not sure  GYNECOLOGIC HISTORY:  Menarche age 38, first live birth age 40, which the patient understands is an independent risk factor for breast cancer. She is GX P3. She still having regular periods. 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 Her husband Debbie [name] is a Energy manager at [employer]. Her 3 children are Debbie [name] (14), Debbie [name] (10) and Debbie [name] (7). The patient attends a local [employer]: Not in place  HEALTH MAINTENANCE:  History   Substance Use Topics   .  Smoking status:  Never Smoker   .  Smokeless tobacco:  Never Used   .  Alcohol Use:  No   Colonoscopy: January 2015  UJW:[id number] 2014  Bone density:  Lipid panel:  Allergies   Allergen  Reactions   .  Dust Mite Extract  Itching    Current Outpatient Prescriptions   Medication  Sig  Dispense  Refill   .  ALPRAZolam (XANAX) 0.5 MG tablet      .  amoxicillin (AMOXIL) 500 MG capsule      .   atorvastatin (LIPITOR) 20 MG tablet  Take 20 mg by mouth daily.     Marland Kitchen  azithromycin (ZITHROMAX) 250 MG tablet      .  cetirizine (ZYRTEC) 10 MG tablet  Take 10 mg by mouth daily.     Marland Kitchen  dexamethasone (DECADRON) 4 MG tablet  Take 2 tablets by mouth once a day on the day after chemotherapy and then take 2 tablets two times a day for 2 days. Take with food.  30 tablet  1   .  FROVA 2.5 MG tablet  Take 2.5 mg by  mouth daily as needed for migraine.     Marland Kitchen  HYDROcodone-homatropine (HYCODAN) 5-1.5 MG/5ML syrup      .  lidocaine-prilocaine (EMLA) cream  Apply 1 application topically as needed. Apply over port site 1-2 hours before chemotherapy and cover with plastic wrap  30 g  0   .  LORazepam (ATIVAN) 0.5 MG tablet  Take 1 tablet (0.5 mg total) by mouth at bedtime as needed (Nausea or vomiting).  30 tablet  0   .  mometasone (NASONEX) 50 MCG/ACT nasal spray  Place 2 sprays into the nose daily as needed (for allergies).     .  ondansetron (ZOFRAN) 8 MG tablet  Take 1 tablet (8 mg total) by mouth 2 (two) times daily as needed. Start on the third day after chemotherapy.  30 tablet  1   .  oxyCODONE-acetaminophen (PERCOCET/ROXICET) 5-325 MG per tablet      .  oxymetazoline (12 HOUR NASAL SPRAY) 0.05 % nasal spray  2 sprays by Nasal route as needed.     Marland Kitchen  oxymetazoline (AFRIN) 0.05 % nasal spray  Place 2 sprays into the nose daily as needed. congestion     .  PARoxetine (PAXIL) 20 MG tablet  Take 20 mg by mouth every morning.     .  polyethylene glycol-electrolytes (NULYTELY/GOLYTELY) 420 G solution      .  predniSONE (DELTASONE) 20 MG tablet      .  prochlorperazine (COMPAZINE) 10 MG tablet  Take 1 tablet (10 mg total) by mouth every 6 (six) hours as needed (Nausea or vomiting).  30 tablet  1   .  Vitamin D, Ergocalciferol, (DRISDOL) 50000 UNITS CAPS capsule  Take 50,000 Units by mouth every 7 (seven) days.      No current facility-administered medications for this visit.   OBJECTIVE:in no acute distress    ECOG FS:1 -  Sclerae unicteric, pupils [name] and equal  Oropharynx clear and moist  No cervical or supraclavicular adenopathy  Lungs no rales or rhonchi Heart regular rate and rhythm  Abd soft, nontender, positive bowel sounds  MSK no focal spinal tenderness, no upper extremity lymphedema  Neuro: nonfocal, well oriented, appropriate affect  Breasts: the right breast is unremarkable. The mass in the lower inner quadrant of the left breast is palpable non tender There is no skin change or nipple retraction. I do not palpate any left axillary adenopathy.  Skin: The port is in the left upper chest wall is easily palpable. There is no erythema or swelling.   LAB RESULTS:        Component Value Date/Time   WBC 1.5* 08/24/2013 1304   WBC 9.9 09/25/2011 1110   RBC 4.26 08/24/2013 1304   RBC 4.16 09/25/2011 1110   HGB 11.7 08/24/2013 1304   HGB 11.5* 09/25/2011 1110   HCT 34.5* 08/24/2013 1304   HCT 36.1 09/25/2011 1110   PLT 186 08/24/2013 1304   PLT 364 09/25/2011 1110   MCV 81.0 08/24/2013 1304   MCV 86.8 09/25/2011 1110   MCH 27.5 08/24/2013 1304   MCH 27.6 09/25/2011 1110   MCHC 33.9 08/24/2013 1304   MCHC 31.9 09/25/2011 1110   RDW 13.4 08/24/2013 1304   RDW 14.2 09/25/2011 1110   LYMPHSABS 0.9 08/24/2013 1304   MONOABS 0.3 08/24/2013 1304   EOSABS 0.0 08/24/2013 1304   BASOSABS 0.0 08/24/2013 1304    CMP     Component Value Date/Time   NA 140 08/24/2013 1304   K  3.9 08/24/2013 1304   CO2 22 08/24/2013 1304   GLUCOSE 89 08/24/2013 1304   BUN 11.6 08/24/2013 1304   CREATININE 0.7 08/24/2013 1304   CALCIUM 9.0 08/24/2013 1304   PROT 6.6 08/24/2013 1304   ALBUMIN 3.7 08/24/2013 1304   AST 11 08/24/2013 1304   ALT 22 08/24/2013 1304   ALKPHOS 97 08/24/2013 1304   BILITOT 0.35 08/24/2013 1304    Chemistry       Component  Value  Date/Time  Component  Value  Date/Time    NA  139  07/12/2013 1229   CALCIUM  9.9  07/12/2013 1229    K  4.9  07/12/2013 1229   ALKPHOS  79  07/12/2013 1229    CO2  25   07/12/2013 1229   AST  18  07/12/2013 1229    BUN  11.0  07/12/2013 1229   ALT  22  07/12/2013 1229    CREATININE  0.7  07/12/2013 1229   BILITOT  0.45  07/12/2013 1229   STUDIES:  EXAM:  NUCLEAR MEDICINE PET SKULL BASE TO THIGH  FASTING BLOOD GLUCOSE: Value: 109 mg/dl  TECHNIQUE:  10.9 mCi F-18 FDG was injected intravenously. Full-ring PET imaging  was performed from the skull base to thigh after the radiotracer. CT  data was obtained and used for attenuation correction and anatomic  localization.  COMPARISON: Chest CT done today  FINDINGS:  NECK  No hypermetabolic cervical lymph nodes are identified.There are no  lesions of the pharyngeal mucosal space. There is symmetric activity  within the lymphoid tissue of Waldeyer's ring, within physiologic  limits.  CHEST  Hypermetabolic lesion medially in the left breast demonstrates an  SUV max of 17.7. There is a dominant hypermetabolic left axillary  lymph node with an SUV max of 8.6. There are no hypermetabolic  internal mammary, mediastinal or hilar lymph nodes. There is no  suspicious pulmonary activity. CT findings are deferred to the  diagnostic study of the same date.  ABDOMEN/PELVIS  There is no hypermetabolic activity within the liver, adrenal  glands, spleen or pancreas. There is no hypermetabolic nodal  activity. There is low density throughout the liver consistent with  steatosis.  SKELETON  There is no hypermetabolic activity to suggest osseous metastatic  disease.  IMPRESSION:  1. Known left breast cancer is hypermetabolic. There is a dominant  hypermetabolic left axillary lymph nodal metastasis.  2. No other evidence of metastatic disease.  Electronically Signed  By: Camie Patience M.D.  On: 07/17/2013 08:22      Transthoracic Echocardiography  Patient: Lailah, Marcelli MR #: [id number] Study Date: 07/21/2013 Gender: F Age: 77 Height: 165.1cm Weight: 90.7kg BSA: 1.52m2 Pt. Status: Room:  ORDERING Magrinat, GHazle NordmannREFERRING Magrinat, GHazle NordmannREFERRING SAntony ContrasATTENDING BGlori BickersSONOGRAPHER Jimmy Reel PERFORMING [facility], Outpatient cc:  ------------------------------------------------------------ LV EF: 60% - 65%  ------------------------------------------------------------  ASSESSMENT:  48y.o. [place] woman status post left breast and left axillary lymph node biopsy 06/30/2013 for a clinical T2 N1-2, stage IIB/IIIA invasive ductal carcinoma, grade 2, estrogen and progesterone receptors both strongly positive, HER-2 nonamplified, with an MIB-1 of 36%.  (1) there is a 5 mm left internal mammary lymph node which was negative on PET scan   (2) cycle# 1 cyclophosphamide and doxorubicin 08/04/2014,  given in dose dense fashion x4 planned with Neulasta day 2; to be followed by paclitaxel   PLAN:  1.Breast cancer as above     08/17/2013   cycle#2  AC.  Patient is neutropenic today.Discussed overall neutropenic precautions.Monitor temp.  2.Sinus infection. Continue on Amoxicillin dose increased to 1000 mg 3 times daily to finish current supply.  3.Heartburn,not sleeping well and blurry vision are due to decadron patient is taking for N/V prevention after chemotherapy. I advised her to start the night after chemotherapy zofran and Ativan and take Decadron 1-2 tabl only if still symptomatic.  Take Ativan 1- 2 tabl to help her sleep. Prescription for Omeprazole 20 mg 1-2 tabl daily for heartburn.  BS was normal today will monitor.   Follow up in 1 week for cycle #3 AC.   With CBC,CMP,Vit D level.   .  All Treatments are entered, labs, chemotherapy and visits through April 23, when she will see Dr.Magrinat again. At that point plan to start the second part of her chemotherapy, and she will have a choice of weekly Taxol x12 or Taxol every 2 weeks x4.  Patient has a good understanding of the overall plan. She agrees with that. She notes a goal of treatment in her cases  cure. She will call with any problems that may develop before the next visit.   Amada Kingfisher, MD  Oncology/Hematology 08/24/13

## 2013-08-28 ENCOUNTER — Telehealth: Payer: Self-pay | Admitting: Oncology

## 2013-08-28 ENCOUNTER — Other Ambulatory Visit: Payer: Self-pay | Admitting: Oncology

## 2013-08-28 ENCOUNTER — Other Ambulatory Visit: Payer: Self-pay

## 2013-08-28 NOTE — Telephone Encounter (Signed)
, °

## 2013-08-29 ENCOUNTER — Telehealth: Payer: Self-pay | Admitting: Oncology

## 2013-08-29 ENCOUNTER — Other Ambulatory Visit: Payer: Self-pay | Admitting: Oncology

## 2013-08-29 NOTE — Telephone Encounter (Signed)
moved appts from cp2 to [place] 4/2, 4/9 and 4/16 per 3/31 pof. lmonvm informing pt and pt to get new schedule 4/2.

## 2013-08-30 ENCOUNTER — Other Ambulatory Visit: Payer: Self-pay

## 2013-08-31 ENCOUNTER — Encounter: Payer: Self-pay | Admitting: *Deleted

## 2013-08-31 ENCOUNTER — Other Ambulatory Visit: Payer: BC Managed Care – PPO

## 2013-08-31 ENCOUNTER — Other Ambulatory Visit (HOSPITAL_BASED_OUTPATIENT_CLINIC_OR_DEPARTMENT_OTHER): Payer: BC Managed Care – PPO

## 2013-08-31 ENCOUNTER — Other Ambulatory Visit: Payer: Self-pay | Admitting: Oncology

## 2013-08-31 ENCOUNTER — Ambulatory Visit (HOSPITAL_BASED_OUTPATIENT_CLINIC_OR_DEPARTMENT_OTHER): Payer: BC Managed Care – PPO | Admitting: Oncology

## 2013-08-31 ENCOUNTER — Ambulatory Visit: Payer: BC Managed Care – PPO

## 2013-08-31 ENCOUNTER — Ambulatory Visit (HOSPITAL_BASED_OUTPATIENT_CLINIC_OR_DEPARTMENT_OTHER): Payer: BC Managed Care – PPO

## 2013-08-31 ENCOUNTER — Other Ambulatory Visit: Payer: Self-pay | Admitting: *Deleted

## 2013-08-31 VITALS — BP 109/73 | HR 98 | Temp 98.5°F | Resp 20 | Ht 64.5 in | Wt 202.0 lb

## 2013-08-31 DIAGNOSIS — Z5111 Encounter for antineoplastic chemotherapy: Secondary | ICD-10-CM

## 2013-08-31 DIAGNOSIS — C50312 Malignant neoplasm of lower-inner quadrant of left female breast: Secondary | ICD-10-CM

## 2013-08-31 DIAGNOSIS — Z452 Encounter for adjustment and management of vascular access device: Secondary | ICD-10-CM

## 2013-08-31 DIAGNOSIS — E7800 Pure hypercholesterolemia, unspecified: Secondary | ICD-10-CM

## 2013-08-31 DIAGNOSIS — C50319 Malignant neoplasm of lower-inner quadrant of unspecified female breast: Secondary | ICD-10-CM

## 2013-08-31 DIAGNOSIS — K589 Irritable bowel syndrome without diarrhea: Secondary | ICD-10-CM

## 2013-08-31 DIAGNOSIS — Z170 Estrogen receptor positive status [ER+]: Secondary | ICD-10-CM

## 2013-08-31 DIAGNOSIS — I341 Nonrheumatic mitral (valve) prolapse: Secondary | ICD-10-CM

## 2013-08-31 DIAGNOSIS — Z95828 Presence of other vascular implants and grafts: Secondary | ICD-10-CM

## 2013-08-31 LAB — COMPREHENSIVE METABOLIC PANEL (CC13)
ALT: 37 U/L (ref 0–55)
AST: 19 U/L (ref 5–34)
Albumin: 4 g/dL (ref 3.5–5.0)
Alkaline Phosphatase: 89 U/L (ref 40–150)
Anion Gap: 13 mEq/L — ABNORMAL HIGH (ref 3–11)
BUN: 13.1 mg/dL (ref 7.0–26.0)
CO2: 24 mEq/L (ref 22–29)
Calcium: 9.6 mg/dL (ref 8.4–10.4)
Chloride: 106 mEq/L (ref 98–109)
Creatinine: 0.8 mg/dL (ref 0.6–1.1)
Glucose: 165 mg/dl — ABNORMAL HIGH (ref 70–140)
Potassium: 4.3 mEq/L (ref 3.5–5.1)
Sodium: 143 mEq/L (ref 136–145)
Total Bilirubin: 0.44 mg/dL (ref 0.20–1.20)
Total Protein: 6.9 g/dL (ref 6.4–8.3)

## 2013-08-31 LAB — CBC WITH DIFFERENTIAL/PLATELET
BASO%: 0.6 % (ref 0.0–2.0)
Basophils Absolute: 0.1 10*3/uL (ref 0.0–0.1)
EOS%: 0.1 % (ref 0.0–7.0)
Eosinophils Absolute: 0 10*3/uL (ref 0.0–0.5)
HCT: 34.7 % — ABNORMAL LOW (ref 34.8–46.6)
HGB: 11.5 g/dL — ABNORMAL LOW (ref 11.6–15.9)
LYMPH%: 10 % — ABNORMAL LOW (ref 14.0–49.7)
MCH: 27.5 pg (ref 25.1–34.0)
MCHC: 33.2 g/dL (ref 31.5–36.0)
MCV: 82.9 fL (ref 79.5–101.0)
MONO#: 0.8 10*3/uL (ref 0.1–0.9)
MONO%: 5.3 % (ref 0.0–14.0)
NEUT#: 13.4 10*3/uL — ABNORMAL HIGH (ref 1.5–6.5)
NEUT%: 84 % — ABNORMAL HIGH (ref 38.4–76.8)
Platelets: 204 10*3/uL (ref 145–400)
RBC: 4.19 10*6/uL (ref 3.70–5.45)
RDW: 14 % (ref 11.2–14.5)
WBC: 15.9 10*3/uL — ABNORMAL HIGH (ref 3.9–10.3)
lymph#: 1.6 10*3/uL (ref 0.9–3.3)

## 2013-08-31 MED ORDER — CYCLOPHOSPHAMIDE CHEMO INJECTION 1 GM
600.0000 mg/m2 | Freq: Once | INTRAMUSCULAR | Status: AC
  Administered 2013-08-31: 1240 mg via INTRAVENOUS
  Filled 2013-08-31: qty 62

## 2013-08-31 MED ORDER — DEXAMETHASONE SODIUM PHOSPHATE 20 MG/5ML IJ SOLN
INTRAMUSCULAR | Status: AC
Start: 2013-08-31 — End: 2013-08-31
  Filled 2013-08-31: qty 5

## 2013-08-31 MED ORDER — PALONOSETRON HCL INJECTION 0.25 MG/5ML
INTRAVENOUS | Status: AC
  Filled 2013-08-31: qty 5

## 2013-08-31 MED ORDER — SODIUM CHLORIDE 0.9 % IJ SOLN
10.0000 mL | INTRAMUSCULAR | Status: DC | PRN
  Filled 2013-08-31: qty 10

## 2013-08-31 MED ORDER — PALONOSETRON HCL INJECTION 0.25 MG/5ML
0.2500 mg | Freq: Once | INTRAVENOUS | Status: AC
  Administered 2013-08-31: 0.25 mg via INTRAVENOUS

## 2013-08-31 MED ORDER — ALTEPLASE 2 MG IJ SOLR
2.0000 mg | Freq: Once | INTRAMUSCULAR | Status: AC | PRN
  Administered 2013-08-31: 2 mg
  Filled 2013-08-31: qty 2

## 2013-08-31 MED ORDER — HEPARIN SOD (PORK) LOCK FLUSH 100 UNIT/ML IV SOLN
500.0000 [IU] | Freq: Once | INTRAVENOUS | Status: AC
  Administered 2013-08-31: 500 [IU] via INTRAVENOUS
  Filled 2013-08-31: qty 5

## 2013-08-31 MED ORDER — SODIUM CHLORIDE 0.9 % IJ SOLN
10.0000 mL | INTRAMUSCULAR | Status: DC | PRN
  Administered 2013-08-31 (×2): 10 mL via INTRAVENOUS
  Filled 2013-08-31: qty 10

## 2013-08-31 MED ORDER — DEXAMETHASONE SODIUM PHOSPHATE 20 MG/5ML IJ SOLN
12.0000 mg | Freq: Once | INTRAMUSCULAR | Status: AC
  Administered 2013-08-31: 12 mg via INTRAVENOUS

## 2013-08-31 MED ORDER — SODIUM CHLORIDE 0.9 % IV SOLN
Freq: Once | INTRAVENOUS | Status: AC
  Administered 2013-08-31: 12:00:00 via INTRAVENOUS

## 2013-08-31 MED ORDER — DOXORUBICIN HCL CHEMO IV INJECTION 2 MG/ML
60.0000 mg/m2 | Freq: Once | INTRAVENOUS | Status: AC
  Administered 2013-08-31: 124 mg via INTRAVENOUS
  Filled 2013-08-31: qty 62

## 2013-08-31 MED ORDER — HEPARIN SOD (PORK) LOCK FLUSH 100 UNIT/ML IV SOLN
500.0000 [IU] | Freq: Once | INTRAVENOUS | Status: AC | PRN
  Administered 2013-08-31: 500 [IU]
  Filled 2013-08-31: qty 5

## 2013-08-31 MED ORDER — SODIUM CHLORIDE 0.9 % IV SOLN
150.0000 mg | Freq: Once | INTRAVENOUS | Status: AC
  Administered 2013-08-31: 150 mg via INTRAVENOUS
  Filled 2013-08-31: qty 5

## 2013-08-31 NOTE — Progress Notes (Signed)
Patient in for Port-A-Cath flush and lab draws today. Port accessed and flushed without any difficulty. No blood return noted. No blood return verified with Tiffany, LPN. Call placed to Dr. [name] nurse Warren Lacy Alroy Dust, RN. Patient's labs to be drawn during office visit today with Dr. Jana Hakim per Lannette Donath, RN.

## 2013-08-31 NOTE — Addendum Note (Signed)
Addended by: Lannette Donath E on: 08/31/2013 10:49 AM   Modules accepted: Orders

## 2013-08-31 NOTE — Progress Notes (Signed)
[facility] [facility]  [phone] [phone]     ID: [name] OB: March 21, 1966  MR#: [id number]  XQJ#:[id number]  PCP: [name], MD GYN:  [name]: [name] OTHER MD: Cline Cools  CHIEF COMPLAINT: "They can't do a blood back from a port"  HISTORY OF PRESENT ILLNESS: Debbie [name] had routine yearly screening mammography at the breast center 06/19/2013 showing a possible mass in the left breast. The right breast was unremarkable. On 06/30/2013 additional views of the left breast showed an irregular spiculated dense mass measuring 2.2 cm in the lower inner quadrant. This was palpable at the 8:00 location. Ultrasound showed an irregular hypoechoic mass measuring 2.3 cm and in the left axilla a dominant abnormal appearing lymph node with cortical thickening measuring 1.2 cm.  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  [id number] the patient underwent bilateral breast MRI. This showed her breasts to be dense (composition C.). In the left breast at the 8:00 position there was a 2.4 cm enhancing mass containing a biopsy clip. There were no additional areas of concern. In the axilla on the left there was a 2.6 cm enlarged left axillary lymph node. There was also a 0.5 cm left internal mammary lymph nodes noted. The right side was unremarkable.  The patient's subsequent history is as detailed below.  INTERVAL HISTORY: Debbie [name] returns today for followup of her breast cancer. Today is day 1 cycle 3 of 4 planned cycles of cyclophosphamide and doxorubicin, to  be followed by Taxol which could be given either weekly or every 2 weeks at the patient's preference. Today she was pretty clear she would prefer to do the paclitaxel every 2 weeks x4 to "finish more quickly". Of course if she changes her mind we can always switch to the weekly to complete her therapy   REVIEW OF SYSTEMS: Debbie [name] t gets a little bit of nausea on day 3, which she controls well with her standard anti-emetics. She has no bowel movement for 2 or 3 days and then put a lot of diarrhea" which "clean her out". She is then back to her usual slightly constipated bowel pattern. She gets blurred vision for a few days after each chemotherapy which slowly resolves. She has not had any mouth sores, cough, phlegm production, pleurisy, or change in bladder habits. There has been no rash, bleeding, or fever. Generally her port has worked well, although today we are having trouble getting blood back from it. A detailed review of systems today was otherwise noncontributory  PAST MEDICAL HISTORY: Past Medical History  Diagnosis Date  . MVP (mitral valve prolapse)   . IBS (irritable bowel syndrome)   . Migraines     menstral  . Hyperlipidemia   . MVP (mitral valve prolapse)   . Migraines   . IBS (irritable bowel syndrome)   . Abnormal Pap smear   . Seasonal allergies   . Anxiety   . Menorrhagia   . Breast cancer 2015    ER+/PR+/Her2-    PAST SURGICAL HISTORY: Past Surgical History  Procedure Laterality Date  . Wisdom teeth  removal    . Vulva /perineum biopsy  2011  . Svd       x 3  . Endometrial ablation    . Portacath placement Left 07/18/2013    Procedure: INSERTION PORT-A-CATH;  Surgeon: [name], MD;  Location: [facility] OR;  Service: General;  Laterality: Left;    FAMILY HISTORY Family History  Problem Relation Age of Onset  . Cancer Maternal Grandmother     BREAST AND UTERINE CANCER  . Uterine cancer Maternal Grandmother 84  . Hyperlipidemia Mother   . Hypertension Mother   .  Multiple myeloma Father 62  . Lung cancer Maternal Development worker, international aid  . Testicular cancer Other 42  The patient's father is living, currently age 26. He has a history of multiple myeloma. The patient's mother is 16. The patient's parents live in [place] The patient had no brothers, 2 sisters. There is no history of breast or ovarian cancer in the family. She believes one of her grandfathers died from lung cancer and another from pancreatic cancer, but is not sure  GYNECOLOGIC HISTORY:  Menarche age 34, first live birth age 89, which the patient understands is an independent risk factor for breast cancer. She is GX P3. She still having regular periods. 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 Her husband [name] is a Energy manager at [employer]. Her 3 children are [name] (14), [name] (10) and [name] (7). The patient attends a local [place]: Not in place   HEALTH MAINTENANCE: History  Substance Use Topics  . Smoking status: Never Smoker   . Smokeless tobacco: Never Used  . Alcohol Use: No     Colonoscopy: January 2015  HUO:[id number] 2014  Bone density:  Lipid panel:  Allergies  Allergen Reactions  . Dust Mite Extract Itching    Current Outpatient Prescriptions  Medication Sig Dispense Refill  . ALPRAZolam (XANAX) 0.5 MG tablet       . amoxicillin (AMOXIL) 500 MG capsule       . atorvastatin (LIPITOR) 20 MG tablet Take 20 mg by mouth daily.      Marland Kitchen azithromycin (ZITHROMAX) 250 MG tablet       . cetirizine (ZYRTEC) 10 MG tablet Take 10 mg by mouth daily.      Marland Kitchen dexamethasone (DECADRON) 4 MG tablet Take 2 tablets by mouth once a day on the day after chemotherapy and then take 2 tablets two times a day for 2 days. Take with food.  30 tablet  1  . FROVA 2.5 MG tablet Take 2.5 mg by mouth daily as needed for migraine.       Marland Kitchen  HYDROcodone-homatropine (HYCODAN) 5-1.5 MG/5ML syrup       . lidocaine-prilocaine (EMLA) cream Apply 1 application topically as needed. Apply over port site 1-2 hours before chemotherapy and cover with plastic wrap  30 g  0  . LORazepam (ATIVAN) 0.5 MG tablet Take 1 tablet (0.5 mg total) by mouth at bedtime as needed (Nausea or vomiting).  30 tablet  0  . mometasone (NASONEX) 50 MCG/ACT nasal spray Place 2 sprays into the nose daily as needed (for allergies).       Marland Kitchen omeprazole (PRILOSEC) 20 MG capsule Take 1 capsule (20 mg total) by mouth 2 (two) times daily before a meal.  60 capsule  3  .  ondansetron (ZOFRAN) 8 MG tablet Take 1 tablet (8 mg total) by mouth 2 (two) times daily as needed. Start on the third day after chemotherapy.  30 tablet  1  . oxyCODONE-acetaminophen (PERCOCET/ROXICET) 5-325 MG per tablet       . oxymetazoline (AFRIN) 0.05 % nasal spray Place 2 sprays into the nose daily as needed. congestion      . PARoxetine (PAXIL) 20 MG tablet Take 20 mg by mouth every morning.      . polyethylene glycol-electrolytes (NULYTELY/GOLYTELY) 420 G solution       . predniSONE (DELTASONE) 20 MG tablet       . prochlorperazine (COMPAZINE) 10 MG tablet Take 1 tablet (10 mg total) by mouth every 6 (six) hours as needed (Nausea or vomiting).  30 tablet  1  . RELPAX 40 MG tablet       . Vitamin D, Ergocalciferol, (DRISDOL) 50000 UNITS CAPS capsule Take 50,000 Units by mouth every 7 (seven) days.        No current facility-administered medications for this visit.   Facility-Administered Medications Ordered in Other Visits  Medication Dose Route Frequency Provider Last Rate Last Dose  . alteplase (CATHFLO ACTIVASE) injection 2 mg  2 mg Intracatheter Once PRN Chauncey Cruel, MD      . sodium chloride 0.9 % injection 10 mL  10 mL Intravenous PRN Chauncey Cruel, MD   10 mL at 08/31/13 0933    OBJECTIVE: Middle-aged white woman who appears stated age 48 Vitals:   08/31/13 1002  BP: 109/73   Pulse: 98  Temp: 98.5 F (36.9 C)  Resp: 20     Body mass index is 34.15 kg/(m^2).    ECOG FS:1 - Symptomatic but completely ambulatory  Sclerae unicteric, EOMs intact Oropharynx clear and moist No cervical or supraclavicular adenopathy Lungs no rales or rhonchi Heart regular rate and rhythm Abd soft, nontender, positive bowel sounds MSK no focal spinal tenderness, no upper extremity lymphedema Neuro: nonfocal, well oriented, appropriate affect Breasts: Deferred Skin: The port is accessed in the left upper chest wall. The outline is easily palpable. There is no erythema or swelling.  LAB RESULTS:  CMP     Component Value Date/Time   NA 140 08/24/2013 1304   K 3.9 08/24/2013 1304   CO2 22 08/24/2013 1304   GLUCOSE 89 08/24/2013 1304   BUN 11.6 08/24/2013 1304   CREATININE 0.7 08/24/2013 1304   CALCIUM 9.0 08/24/2013 1304   PROT 6.6 08/24/2013 1304   ALBUMIN 3.7 08/24/2013 1304   AST 11 08/24/2013 1304   ALT 22 08/24/2013 1304   ALKPHOS 97 08/24/2013 1304   BILITOT 0.35 08/24/2013 1304    I No results found for this basename: SPEP,  UPEP,   kappa and lambda light chains    Lab Results  Component Value Date   WBC 1.5* 08/24/2013   NEUTROABS 0.3* 08/24/2013   HGB 11.7 08/24/2013   HCT 34.5* 08/24/2013   MCV 81.0 08/24/2013   PLT 186 08/24/2013      Chemistry      Component Value Date/Time   NA 140 08/24/2013 1304   K 3.9 08/24/2013 1304   CO2 22 08/24/2013 1304   BUN 11.6 08/24/2013 1304   CREATININE 0.7 08/24/2013 1304      Component Value Date/Time   CALCIUM 9.0 08/24/2013 1304   ALKPHOS 97 08/24/2013 1304   AST 11 08/24/2013 1304   ALT 22 08/24/2013 1304   BILITOT 0.35 08/24/2013 1304  No results found for this basename: LABCA2    No components found with this basename: [id number]    No results found for this basename: INR,  in the last 168 hours  Urinalysis    Component Value Date/Time   BILIRUBINUR Negative 09/22/2011 1451   PROTEINUR Trace 09/22/2011 1451    UROBILINOGEN negative 09/22/2011 1451   NITRITE Negative 09/22/2011 1451   LEUKOCYTESUR Trace 09/22/2011 1451    STUDIES:  EXAM:  NUCLEAR MEDICINE PET SKULL BASE TO THIGH  FASTING BLOOD GLUCOSE: Value: 109 mg/dl  TECHNIQUE:  10.9 mCi F-18 FDG was injected intravenously. Full-ring PET imaging  was performed from the skull base to thigh after the radiotracer. CT  data was obtained and used for attenuation correction and anatomic  localization.  COMPARISON: Chest CT done today  FINDINGS:  NECK  No hypermetabolic cervical lymph nodes are identified.There are no  lesions of the pharyngeal mucosal space. There is symmetric activity  within the lymphoid tissue of Waldeyer's ring, within physiologic  limits.  CHEST  Hypermetabolic lesion medially in the left breast demonstrates an  SUV max of 17.7. There is a dominant hypermetabolic left axillary  lymph node with an SUV max of 8.6. There are no hypermetabolic  internal mammary, mediastinal or hilar lymph nodes. There is no  suspicious pulmonary activity. CT findings are deferred to the  diagnostic study of the same date.  ABDOMEN/PELVIS  There is no hypermetabolic activity within the liver, adrenal  glands, spleen or pancreas. There is no hypermetabolic nodal  activity. There is low density throughout the liver consistent with  steatosis.  SKELETON  There is no hypermetabolic activity to suggest osseous metastatic  disease.  IMPRESSION:  1. Known left breast cancer is hypermetabolic. There is a dominant  hypermetabolic left axillary lymph nodal metastasis.  2. No other evidence of metastatic disease.  Electronically Signed  By: Camie Patience M.D.  On: 07/17/2013 08:22        Transthoracic Echocardiography  Patient: Elyza, Whitt MR #: [id number] Study Date: 07/21/2013 Gender: F Age: 73 Height: 165.1cm Weight: 90.7kg BSA: 1.86m2 Pt. Status: Room:  ORDERING Jasenia Weilbacher, GHazle NordmannREFERRING Thora Scherman, GHazle NordmannREFERRING SAntony ContrasATTENDING BGlori BickersSONOGRAPHER Jimmy Reel PERFORMING [facility], Outpatient cc:  ------------------------------------------------------------ LV EF: 60% - 65%  ------------------------------------------------------------  ASSESSMENT: 48y.o. [place] woman status post left breast and left axillary lymph node biopsy 06/30/2013 for a clinical T2 N1-2, stage IIB/IIIA invasive ductal carcinoma, grade 2, estrogen and progesterone receptors both strongly positive, HER-2 nonamplified, with an MIB-1 of 36%.  (1) there is a 5 mm left internal mammary lymph node which was negative on PET scan  (2) cyclophosphamide and doxorubicin started 08/04/2014, given in dose dense fashion x4 with Neulasta day 2; to be followed by paclitaxel again given in dose dense fashion x4 with Neulasta support  PLAN: We are having some port access trouble this morning, which will delay her treatment a little but hopefully we will get her treatment accomplished today so there are no overall delays. This is of course day 1 of cycle 3 and she only has one more cycle of this chemotherapy to go. She is tolerating it well.  Today we discussed how she would prefer to receive her paclitaxel, namely every 2 weeks x4 or every week x12. She would much prefer to do it in dose dense fashion for 4 doses" get it over with". This is perfectly reasonable. She really has appointments for her remaining treatments and when  she sees me on April 23 we will discuss the possible side effects, toxicities and complications of paclitaxel.  [place] knows to call for any problems that may develop before her next visit here.   Chauncey Cruel, MD   08/31/2013 10:33 AM

## 2013-08-31 NOTE — Progress Notes (Signed)
Patient at [facility] for f/u appointment with Dr. Jana Hakim and chemotherapy.  Patient reports that she is doing well.  Her only complaint is fatigue.  She appears to be tolerating her treatments well.  She denies any questions or concerns at this time.  I encouraged her to call me for any needs.

## 2013-08-31 NOTE — Patient Instructions (Signed)
[facility] Discharge Instructions for Patients Receiving Chemotherapy  Today you received the following chemotherapy agents doxorubicin/cytoxan.    To help prevent nausea and vomiting after your treatment, we encourage you to take your nausea medication as directed   If you develop nausea and vomiting that is not controlled by your nausea medication, call the clinic.   BELOW ARE SYMPTOMS THAT SHOULD BE REPORTED IMMEDIATELY:  *FEVER GREATER THAN 100.5 F  *CHILLS WITH OR WITHOUT FEVER  NAUSEA AND VOMITING THAT IS NOT CONTROLLED WITH YOUR NAUSEA MEDICATION  *UNUSUAL SHORTNESS OF BREATH  *UNUSUAL BRUISING OR BLEEDING  TENDERNESS IN MOUTH AND THROAT WITH OR WITHOUT PRESENCE OF ULCERS  *URINARY PROBLEMS  *BOWEL PROBLEMS  UNUSUAL RASH Items with * indicate a potential emergency and should be followed up as soon as possible.  Feel free to call the clinic you have any questions or concerns. The clinic phone number is [phone].  

## 2013-08-31 NOTE — Addendum Note (Signed)
Addended by: Lannette Donath E on: 08/31/2013 11:11 AM   Modules accepted: Orders

## 2013-08-31 NOTE — Patient Instructions (Signed)

## 2013-09-01 ENCOUNTER — Telehealth: Payer: Self-pay | Admitting: *Deleted

## 2013-09-01 ENCOUNTER — Ambulatory Visit (HOSPITAL_BASED_OUTPATIENT_CLINIC_OR_DEPARTMENT_OTHER): Payer: BC Managed Care – PPO

## 2013-09-01 VITALS — BP 132/64 | HR 92 | Temp 98.5°F

## 2013-09-01 DIAGNOSIS — C50312 Malignant neoplasm of lower-inner quadrant of left female breast: Secondary | ICD-10-CM

## 2013-09-01 DIAGNOSIS — Z5189 Encounter for other specified aftercare: Secondary | ICD-10-CM

## 2013-09-01 DIAGNOSIS — C50319 Malignant neoplasm of lower-inner quadrant of unspecified female breast: Secondary | ICD-10-CM

## 2013-09-01 LAB — VITAMIN D 25 HYDROXY (VIT D DEFICIENCY, FRACTURES): Vit D, 25-Hydroxy: 22 ng/mL — ABNORMAL LOW (ref 30–89)

## 2013-09-01 MED ORDER — PEGFILGRASTIM INJECTION 6 MG/0.6ML
6.0000 mg | Freq: Once | SUBCUTANEOUS | Status: AC
  Administered 2013-09-01: 6 mg via SUBCUTANEOUS
  Filled 2013-09-01: qty 0.6

## 2013-09-01 NOTE — Telephone Encounter (Signed)
Mailed after appt letter to pt. 

## 2013-09-07 ENCOUNTER — Ambulatory Visit (HOSPITAL_BASED_OUTPATIENT_CLINIC_OR_DEPARTMENT_OTHER): Payer: BC Managed Care – PPO | Admitting: Oncology

## 2013-09-07 ENCOUNTER — Other Ambulatory Visit (HOSPITAL_BASED_OUTPATIENT_CLINIC_OR_DEPARTMENT_OTHER): Payer: BC Managed Care – PPO

## 2013-09-07 VITALS — BP 109/64 | HR 103 | Temp 98.5°F | Resp 20 | Ht 64.5 in | Wt 200.8 lb

## 2013-09-07 DIAGNOSIS — C50312 Malignant neoplasm of lower-inner quadrant of left female breast: Secondary | ICD-10-CM

## 2013-09-07 DIAGNOSIS — C50319 Malignant neoplasm of lower-inner quadrant of unspecified female breast: Secondary | ICD-10-CM

## 2013-09-07 DIAGNOSIS — K589 Irritable bowel syndrome without diarrhea: Secondary | ICD-10-CM

## 2013-09-07 DIAGNOSIS — E7800 Pure hypercholesterolemia, unspecified: Secondary | ICD-10-CM

## 2013-09-07 DIAGNOSIS — Z170 Estrogen receptor positive status [ER+]: Secondary | ICD-10-CM

## 2013-09-07 DIAGNOSIS — I341 Nonrheumatic mitral (valve) prolapse: Secondary | ICD-10-CM

## 2013-09-07 LAB — CBC WITH DIFFERENTIAL/PLATELET
BASO%: 0.7 % (ref 0.0–2.0)
Basophils Absolute: 0 10*3/uL (ref 0.0–0.1)
EOS%: 0.7 % (ref 0.0–7.0)
Eosinophils Absolute: 0 10*3/uL (ref 0.0–0.5)
HCT: 33 % — ABNORMAL LOW (ref 34.8–46.6)
HGB: 11.1 g/dL — ABNORMAL LOW (ref 11.6–15.9)
LYMPH%: 40.9 % (ref 14.0–49.7)
MCH: 27.6 pg (ref 25.1–34.0)
MCHC: 33.6 g/dL (ref 31.5–36.0)
MCV: 82.1 fL (ref 79.5–101.0)
MONO#: 0.2 10*3/uL (ref 0.1–0.9)
MONO%: 16.8 % — ABNORMAL HIGH (ref 0.0–14.0)
NEUT#: 0.6 10*3/uL — ABNORMAL LOW (ref 1.5–6.5)
NEUT%: 40.9 % (ref 38.4–76.8)
Platelets: 176 10*3/uL (ref 145–400)
RBC: 4.02 10*6/uL (ref 3.70–5.45)
RDW: 14.1 % (ref 11.2–14.5)
WBC: 1.4 10*3/uL — ABNORMAL LOW (ref 3.9–10.3)
lymph#: 0.6 10*3/uL — ABNORMAL LOW (ref 0.9–3.3)

## 2013-09-07 NOTE — Progress Notes (Signed)
[place]  Telephone:(336) [phone] Fax:(336) [phone]     ID: Debbie [name] OB: 09-Jul-1965  MR#: [id number]  GGY#:[id number]  PCP: [name], MD GYN:  [name]: [name] OTHER MD: [name], De Nurse  CHIEF COMPLAINT: Active chemotherapy for breast cancer  HISTORY OF PRESENT ILLNESS: Debbie [name] had routine yearly screening mammography at the breast center 06/19/2013 showing a possible mass in the left breast. The right breast was unremarkable. On 06/30/2013 additional views of the left breast showed an irregular spiculated dense mass measuring 2.2 cm in the lower inner quadrant. This was palpable at the 8:00 location. Ultrasound showed an irregular hypoechoic mass measuring 2.3 cm and in the left axilla a dominant abnormal appearing lymph node with cortical thickening measuring 1.2 cm.  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  [id number] the patient underwent bilateral breast MRI. This showed her breasts to be dense (composition C.). In the left breast at the 8:00 position there was a 2.4 cm enhancing mass containing a biopsy clip. There were no additional areas of concern. In the axilla on the left there was a 2.6 cm enlarged left axillary lymph node. There was also a 0.5 cm left internal mammary lymph nodes noted. The right side was unremarkable.  The patient's subsequent history is as detailed below.  INTERVAL HISTORY: Debbie [name] returns today for followup of her breast cancer. Today is day 8 cycle 3 of 4 planned cycles of cyclophosphamide and doxorubicin, to be  followed by Taxol again in dose dense fashion. She receives Neulasta on day 2   REVIEW OF SYSTEMS: Debbie [name] is tolerating treatment generally well. She usually has a bowel movement about every 3 days. When she gets chemotherapy she doesn't have a bowel movement about 6 days and then on day 7 she gets cramps and diarrhea. I think it would be helpful if she took some magnesium days 2 or 3 and make sure she had a bowel movement at that time so things Dr. Cherylann Parr.. She continues to have menstrual periods although her period is now about to do. She is having some hot flashes, particularly at night. She feels fatigued but is able to work at least 2 days a week. She gets occasional headaches, but her migraines have not been active. A detailed review of systems today was otherwise noncontributory  PAST MEDICAL HISTORY: Past Medical History  Diagnosis Date  . MVP (mitral valve prolapse)   . IBS (irritable bowel syndrome)   . Migraines     menstral  . Hyperlipidemia   . MVP (mitral valve prolapse)   . Migraines   . IBS (irritable bowel syndrome)   . Abnormal Pap smear   . Seasonal allergies   . Anxiety   . Menorrhagia   . Breast cancer 2015    ER+/PR+/Her2-    PAST SURGICAL HISTORY: Past Surgical History  Procedure Laterality Date  . Wisdom teeth removal    . Vulva /perineum biopsy  2011  . Svd       x 3  . Endometrial ablation    . Portacath placement Left 07/18/2013  Procedure: INSERTION PORT-A-CATH;  Surgeon: [name], MD;  Location: [facility] OR;  Service: General;  Laterality: Left;    FAMILY HISTORY Family History  Problem Relation Age of Onset  . Cancer Maternal Grandmother     BREAST AND UTERINE CANCER  . Uterine cancer Maternal Grandmother 84  . Hyperlipidemia Mother   . Hypertension Mother   . Multiple myeloma Father 78  . Lung cancer Maternal Development worker, international aid  . Testicular cancer Other 14  The patient's father is living, currently age 66. He has a history of multiple  myeloma. The patient's mother is 60. The patient's parents live in [place] The patient had no brothers, 2 sisters. There is no history of breast or ovarian cancer in the family. She believes one of her grandfathers died from lung cancer and another from pancreatic cancer, but is not sure  GYNECOLOGIC HISTORY:  Menarche age 11, first live birth age 9, which the patient understands is an independent risk factor for breast cancer. She is GX P3. She still having regular periods. 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 Her husband Debbie [name] is a Energy manager at [employer]. Her 3 children are Debbie [name] (14), Debbie [name] (10) and Debbie [name] (7). The patient attends a local [employer]: Not in place   HEALTH MAINTENANCE: History  Substance Use Topics  . Smoking status: Never Smoker   . Smokeless tobacco: Never Used  . Alcohol Use: No     Colonoscopy: January 2015  EHM:[id number] 2014  Bone density:  Lipid panel:  Allergies  Allergen Reactions  . Dust Mite Extract Itching    Current Outpatient Prescriptions  Medication Sig Dispense Refill  . ALPRAZolam (XANAX) 0.5 MG tablet       . atorvastatin (LIPITOR) 20 MG tablet Take 20 mg by mouth daily.      Marland Kitchen azithromycin (ZITHROMAX) 250 MG tablet       . cetirizine (ZYRTEC) 10 MG tablet Take 10 mg by mouth daily.      Marland Kitchen dexamethasone (DECADRON) 4 MG tablet Take 2 tablets by mouth once a day on the day after chemotherapy and then take 2 tablets two times a day for 2 days. Take with food.  30 tablet  1  . FROVA 2.5 MG tablet Take 2.5 mg by mouth daily as needed for migraine.       . lidocaine-prilocaine (EMLA) cream Apply 1 application topically as needed. Apply over port site 1-2 hours before chemotherapy and cover with plastic wrap  30 g  0  . LORazepam (ATIVAN) 0.5 MG tablet Take 1 tablet (0.5 mg total) by mouth at bedtime  as needed (Nausea or vomiting).  30 tablet  0  . mometasone (NASONEX) 50 MCG/ACT nasal spray Place 2 sprays into the nose daily as needed (for allergies).       Marland Kitchen omeprazole (PRILOSEC) 20 MG capsule Take 1 capsule (20 mg total) by mouth 2 (two) times daily before a meal.  60 capsule  3  . ondansetron (ZOFRAN) 8 MG tablet Take 1 tablet (8 mg total) by mouth 2 (two) times daily as needed. Start on the third day after chemotherapy.  30 tablet  1  . oxyCODONE-acetaminophen (PERCOCET/ROXICET) 5-325 MG per tablet       . oxymetazoline (AFRIN) 0.05 % nasal spray Place 2 sprays into the nose daily as  needed. congestion      . PARoxetine (PAXIL) 20 MG tablet Take 20 mg by mouth every morning.      . polyethylene glycol-electrolytes (NULYTELY/GOLYTELY) 420 G solution       . predniSONE (DELTASONE) 20 MG tablet       . prochlorperazine (COMPAZINE) 10 MG tablet Take 1 tablet (10 mg total) by mouth every 6 (six) hours as needed (Nausea or vomiting).  30 tablet  1  . RELPAX 40 MG tablet       . Vitamin D, Ergocalciferol, (DRISDOL) 50000 UNITS CAPS capsule Take 50,000 Units by mouth every 7 (seven) days.        No current facility-administered medications for this visit.    OBJECTIVE: Middle-aged white woman in no acute distress Filed Vitals:   09/07/13 1111  BP: 109/64  Pulse: 103  Temp: 98.5 F (36.9 C)  Resp: 20     Body mass index is 33.95 kg/(m^2).    ECOG FS:1 - Symptomatic but completely ambulatory  Sclerae unicteric, pupils round and equal Oropharynx clear and moist, no erythema or exudate No cervical or supraclavicular adenopathy Lungs no rales or rhonchi Heart regular rate and rhythm Abd soft, nontender, positive bowel sounds MSK no focal spinal tenderness, no upper extremity lymphedema Neuro: nonfocal, well oriented, positive affect Breasts: The right breast is unremarkable. I do not palpate a clearly defined mass in the left breast. There are no skin or nipple changes of concern. The  left axilla is benign.  LAB RESULTS:  CMP     Component Value Date/Time   NA 143 08/31/2013 1050   K 4.3 08/31/2013 1050   CO2 24 08/31/2013 1050   GLUCOSE 165* 08/31/2013 1050   BUN 13.1 08/31/2013 1050   CREATININE 0.8 08/31/2013 1050   CALCIUM 9.6 08/31/2013 1050   PROT 6.9 08/31/2013 1050   ALBUMIN 4.0 08/31/2013 1050   AST 19 08/31/2013 1050   ALT 37 08/31/2013 1050   ALKPHOS 89 08/31/2013 1050   BILITOT 0.44 08/31/2013 1050    I No results found for this basename: SPEP,  UPEP,   kappa and lambda light chains    Lab Results  Component Value Date   WBC 1.4* 09/07/2013   NEUTROABS 0.6* 09/07/2013   HGB 11.1* 09/07/2013   HCT 33.0* 09/07/2013   MCV 82.1 09/07/2013   PLT 176 09/07/2013      Chemistry      Component Value Date/Time   NA 143 08/31/2013 1050   K 4.3 08/31/2013 1050   CO2 24 08/31/2013 1050   BUN 13.1 08/31/2013 1050   CREATININE 0.8 08/31/2013 1050      Component Value Date/Time   CALCIUM 9.6 08/31/2013 1050   ALKPHOS 89 08/31/2013 1050   AST 19 08/31/2013 1050   ALT 37 08/31/2013 1050   BILITOT 0.44 08/31/2013 1050       No results found for this basename: LABCA2    No components found with this basename: [id number]    No results found for this basename: INR,  in the last 168 hours  Urinalysis    Component Value Date/Time   BILIRUBINUR Negative 09/22/2011 1451   PROTEINUR Trace 09/22/2011 1451   UROBILINOGEN negative 09/22/2011 1451   NITRITE Negative 09/22/2011 1451   LEUKOCYTESUR Trace 09/22/2011 1451    STUDIES:  EXAM:  NUCLEAR MEDICINE PET SKULL BASE TO THIGH  FASTING BLOOD GLUCOSE: Value: 109 mg/dl  TECHNIQUE:  10.9 mCi F-18 FDG was injected intravenously. Full-ring PET imaging  was performed from the skull base to thigh after the radiotracer. CT  data was obtained and used for attenuation correction and anatomic  localization.  COMPARISON: Chest CT done today  FINDINGS:  NECK  No hypermetabolic cervical lymph nodes are identified.There are no  lesions of the pharyngeal  mucosal space. There is symmetric activity  within the lymphoid tissue of Waldeyer's ring, within physiologic  limits.  CHEST  Hypermetabolic lesion medially in the left breast demonstrates an  SUV max of 17.7. There is a dominant hypermetabolic left axillary  lymph node with an SUV max of 8.6. There are no hypermetabolic  internal mammary, mediastinal or hilar lymph nodes. There is no  suspicious pulmonary activity. CT findings are deferred to the  diagnostic study of the same date.  ABDOMEN/PELVIS  There is no hypermetabolic activity within the liver, adrenal  glands, spleen or pancreas. There is no hypermetabolic nodal  activity. There is low density throughout the liver consistent with  steatosis.  SKELETON  There is no hypermetabolic activity to suggest osseous metastatic  disease.  IMPRESSION:  1. Known left breast cancer is hypermetabolic. There is a dominant  hypermetabolic left axillary lymph nodal metastasis.  2. No other evidence of metastatic disease.  Electronically Signed  By: Camie Patience M.D.  On: 07/17/2013 08:22        Transthoracic Echocardiography  Patient: Adam, Demary MR #: [id number] Study Date: 07/21/2013 Gender: F Age: 23 Height: 165.1cm Weight: 90.7kg BSA: 1.3m2 Pt. Status: Room:  ORDERING Magrinat, GHazle NordmannREFERRING Magrinat, GHazle NordmannREFERRING SAntony ContrasATTENDING BGlori BickersSONOGRAPHER Jimmy Reel PERFORMING [facility], Outpatient cc:  ------------------------------------------------------------ LV EF: 60% - 65%  ------------------------------------------------------------  ASSESSMENT: 48y.o. [place] woman status post left breast and left axillary lymph node biopsy 06/30/2013 for a clinical T2 N1-2, stage IIB/IIIA invasive ductal carcinoma, grade 2, estrogen and progesterone receptors both strongly positive, HER-2 nonamplified, with an MIB-1 of 36%.  (1) there is a 5 mm left internal mammary lymph node which was  negative on PET scan  (2) cyclophosphamide and doxorubicin started 08/04/2014, given in dose dense fashion x4 with Neulasta day 2; to be followed by paclitaxel again given in dose dense fashion x4 with Neulasta support  PLAN: She is tolerating treatment generally quite well. I think it would help if she make sure to have a bowel movement around day 3 or so. She normally uses magnesium for this and there is no problem with that. I have encouraged her to take a little walks which I think will help the fatigue more than anything else. Luckily she is not particularly anemic.  Her daughter had temperatures up to 102 this past week. Her pediatrician felt this was a viral illness. TKaleihas not had any fever. With 600 neutrophils I'm not going to start her on Cipro, but she is aware of the need to call u[place]oreaif any temperature develops.  Otherwise she will return to see u[place]oreaagain in one week. She will receive her final cycle of cyclophosphamide and doxorubicin at that time and we will set her up for a paclitaxel treatments to follow. She knows to call for any problems that may develop before the next visit here.    GChauncey Cruel MD   09/07/2013 11:17 AM

## 2013-09-14 ENCOUNTER — Telehealth: Payer: Self-pay | Admitting: Oncology

## 2013-09-14 ENCOUNTER — Ambulatory Visit (HOSPITAL_BASED_OUTPATIENT_CLINIC_OR_DEPARTMENT_OTHER): Payer: BC Managed Care – PPO | Admitting: Oncology

## 2013-09-14 ENCOUNTER — Other Ambulatory Visit (HOSPITAL_BASED_OUTPATIENT_CLINIC_OR_DEPARTMENT_OTHER): Payer: BC Managed Care – PPO

## 2013-09-14 ENCOUNTER — Ambulatory Visit (HOSPITAL_BASED_OUTPATIENT_CLINIC_OR_DEPARTMENT_OTHER): Payer: BC Managed Care – PPO

## 2013-09-14 VITALS — BP 139/69 | HR 108 | Temp 98.8°F | Resp 18 | Ht 64.5 in | Wt 201.9 lb

## 2013-09-14 DIAGNOSIS — Z170 Estrogen receptor positive status [ER+]: Secondary | ICD-10-CM

## 2013-09-14 DIAGNOSIS — E7800 Pure hypercholesterolemia, unspecified: Secondary | ICD-10-CM

## 2013-09-14 DIAGNOSIS — R51 Headache: Secondary | ICD-10-CM

## 2013-09-14 DIAGNOSIS — C50319 Malignant neoplasm of lower-inner quadrant of unspecified female breast: Secondary | ICD-10-CM

## 2013-09-14 DIAGNOSIS — C50312 Malignant neoplasm of lower-inner quadrant of left female breast: Secondary | ICD-10-CM

## 2013-09-14 DIAGNOSIS — N959 Unspecified menopausal and perimenopausal disorder: Secondary | ICD-10-CM

## 2013-09-14 DIAGNOSIS — Z5112 Encounter for antineoplastic immunotherapy: Secondary | ICD-10-CM

## 2013-09-14 LAB — CBC WITH DIFFERENTIAL/PLATELET
BASO%: 1.7 % (ref 0.0–2.0)
Basophils Absolute: 0.4 10*3/uL — ABNORMAL HIGH (ref 0.0–0.1)
EOS%: 0.4 % (ref 0.0–7.0)
Eosinophils Absolute: 0.1 10*3/uL (ref 0.0–0.5)
HCT: 34.9 % (ref 34.8–46.6)
HGB: 11.3 g/dL — ABNORMAL LOW (ref 11.6–15.9)
LYMPH%: 8.8 % — ABNORMAL LOW (ref 14.0–49.7)
MCH: 27.6 pg (ref 25.1–34.0)
MCHC: 32.4 g/dL (ref 31.5–36.0)
MCV: 85.1 fL (ref 79.5–101.0)
MONO#: 1.8 10*3/uL — ABNORMAL HIGH (ref 0.1–0.9)
MONO%: 7.9 % (ref 0.0–14.0)
NEUT#: 18.6 10*3/uL — ABNORMAL HIGH (ref 1.5–6.5)
NEUT%: 81.2 % — ABNORMAL HIGH (ref 38.4–76.8)
Platelets: 181 10*3/uL (ref 145–400)
RBC: 4.1 10*6/uL (ref 3.70–5.45)
RDW: 15.7 % — ABNORMAL HIGH (ref 11.2–14.5)
WBC: 22.9 10*3/uL — ABNORMAL HIGH (ref 3.9–10.3)
lymph#: 2 10*3/uL (ref 0.9–3.3)

## 2013-09-14 MED ORDER — GABAPENTIN 300 MG PO CAPS: 300.0000 mg | ORAL_CAPSULE | Freq: Every day | ORAL | Status: DC

## 2013-09-14 MED ORDER — SODIUM CHLORIDE 0.9 % IV SOLN
600.0000 mg/m2 | Freq: Once | INTRAVENOUS | Status: AC
  Administered 2013-09-14: 1240 mg via INTRAVENOUS
  Filled 2013-09-14: qty 62

## 2013-09-14 MED ORDER — DEXAMETHASONE SODIUM PHOSPHATE 20 MG/5ML IJ SOLN
12.0000 mg | Freq: Once | INTRAMUSCULAR | Status: AC
  Administered 2013-09-14: 12 mg via INTRAVENOUS

## 2013-09-14 MED ORDER — DOXORUBICIN HCL CHEMO IV INJECTION 2 MG/ML
60.0000 mg/m2 | Freq: Once | INTRAVENOUS | Status: AC
  Administered 2013-09-14: 124 mg via INTRAVENOUS
  Filled 2013-09-14: qty 62

## 2013-09-14 MED ORDER — SODIUM CHLORIDE 0.9 % IV SOLN
Freq: Once | INTRAVENOUS | Status: AC
  Administered 2013-09-14: 10:00:00 via INTRAVENOUS

## 2013-09-14 MED ORDER — PALONOSETRON HCL INJECTION 0.25 MG/5ML
INTRAVENOUS | Status: AC
  Filled 2013-09-14: qty 5

## 2013-09-14 MED ORDER — HEPARIN SOD (PORK) LOCK FLUSH 100 UNIT/ML IV SOLN
500.0000 [IU] | Freq: Once | INTRAVENOUS | Status: AC | PRN
  Administered 2013-09-14: 500 [IU]
  Filled 2013-09-14: qty 5

## 2013-09-14 MED ORDER — PALONOSETRON HCL INJECTION 0.25 MG/5ML
0.2500 mg | Freq: Once | INTRAVENOUS | Status: AC
  Administered 2013-09-14: 0.25 mg via INTRAVENOUS

## 2013-09-14 MED ORDER — SODIUM CHLORIDE 0.9 % IJ SOLN
10.0000 mL | INTRAMUSCULAR | Status: DC | PRN
  Administered 2013-09-14: 10 mL
  Filled 2013-09-14: qty 10

## 2013-09-14 MED ORDER — DEXAMETHASONE SODIUM PHOSPHATE 20 MG/5ML IJ SOLN
INTRAMUSCULAR | Status: AC
  Filled 2013-09-14: qty 5

## 2013-09-14 MED ORDER — FOSAPREPITANT DIMEGLUMINE INJECTION 150 MG
150.0000 mg | Freq: Once | INTRAVENOUS | Status: AC
  Administered 2013-09-14: 150 mg via INTRAVENOUS
  Filled 2013-09-14: qty 5

## 2013-09-14 NOTE — Telephone Encounter (Signed)
per [facility] to call to set appt

## 2013-09-14 NOTE — Progress Notes (Signed)
Debbie [name]  Telephone:(336) [phone] Fax:(336) [phone]     ID: [name] OB: 03-19-1966  MR#: [id number]  JYN#:[id number]  PCP: [name], MD GYN:  [name]: [name] OTHER MD: [name], De Nurse  CHIEF COMPLAINT: Active chemotherapy for breast cancer  HISTORY OF PRESENT ILLNESS: Debbie [name] had routine yearly screening mammography at the breast center 06/19/2013 showing a possible mass in the left breast. The right breast was unremarkable. On 06/30/2013 additional views of the left breast showed an irregular spiculated dense mass measuring 2.2 cm in the lower inner quadrant. This was palpable at the 8:00 location. Ultrasound showed an irregular hypoechoic mass measuring 2.3 cm and in the left axilla a dominant abnormal appearing lymph node with cortical thickening measuring 1.2 cm.  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  [id number] the patient underwent bilateral breast MRI. This showed her breasts to be dense (composition C.). In the left breast at the 8:00 position there was a 2.4 cm enhancing mass containing a biopsy clip. There were no additional areas of concern. In the axilla on the left there was a 2.6 cm enlarged left axillary lymph node. There was also a 0.5 cm left internal mammary lymph nodes noted. The right side was unremarkable.  The patient's subsequent history is as detailed below.  INTERVAL HISTORY: [name] returns today for followup of her breast cancer. Today is day 1 cycle 4 of 4 planned cycles of cyclophosphamide and doxorubicin, to be  followed by Taxol again in dose dense fashion. She receives Neulasta on day 2. The interval history is chiefly significant for her husband having had knee surgery last week.   REVIEW OF SYSTEMS: Hema is doing fine as far as her chemotherapy is concerned. She has not had a period in 6 weeks. She is beginning to have hot flashes, worse at night than during the day. They do wake her up. In addition she is having daily headaches. These are very different from her former migraines. She has a little Tina bit of a cough and a little bit of sinus drainage, but she is taking Claritin daily and this has not helped all. S she has had a little blurred vision which is likely due to her steroid premeds. She has not had dizziness, gait imbalance, nausea, or vomiting problems. She has diarrhea "all the time". She takes an Imodium occasionally and that seems to help. Otherwise a detailed review of systems today was noncontributory  PAST MEDICAL HISTORY: Past Medical History  Diagnosis Date  . MVP (mitral valve prolapse)   . IBS (irritable bowel syndrome)   . Migraines     menstral  . Hyperlipidemia   . MVP (mitral valve prolapse)   . Migraines   . IBS (irritable bowel syndrome)   . Abnormal Pap smear   . Seasonal allergies   . Anxiety   . Menorrhagia   . Breast cancer 2015    ER+/PR+/Her2-    PAST SURGICAL HISTORY: Past Surgical History  Procedure Laterality Date  . Wisdom teeth removal    . Vulva /perineum biopsy  2011  .  Svd       x 3  . Endometrial ablation    . Portacath placement Left 07/18/2013    Procedure: INSERTION PORT-A-CATH;  Surgeon: [name], MD;  Location: [facility] OR;  Service: General;  Laterality: Left;    FAMILY HISTORY Family History  Problem Relation Age of Onset  . Cancer Maternal Grandmother     BREAST AND UTERINE CANCER  . Uterine cancer Maternal Grandmother 84  . Hyperlipidemia Mother   . Hypertension Mother   . Multiple myeloma Father 32  . Lung cancer Maternal  Development worker, international aid  . Testicular cancer Other 37  The patient's father is living, currently age 47. He has a history of multiple myeloma. The patient's mother is 46. The patient's parents live in [place] The patient had no brothers, 2 sisters. There is no history of breast or ovarian cancer in the family. She believes one of her grandfathers died from lung cancer and another from pancreatic cancer, but is not sure  GYNECOLOGIC HISTORY:  Menarche age 68, first live birth age 40, which the patient understands is an independent risk factor for breast cancer. She is GX P3. She still having regular periods. 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 Her husband Debbie [name] is a Energy manager at [employer]. Her 3 children are [name] (14), [name] (10) and [name] (7). The patient attends a local [employer]: Not in place   HEALTH MAINTENANCE: History  Substance Use Topics  . Smoking status: Never Smoker   . Smokeless tobacco: Never Used  . Alcohol Use: No     Colonoscopy: January 2015  QMG:NOIBBCWU 2014  Bone density:  Lipid panel:  Allergies  Allergen Reactions  . Dust Mite Extract Itching    Current Outpatient Prescriptions  Medication Sig Dispense Refill  . ALPRAZolam (XANAX) 0.5 MG tablet       . atorvastatin (LIPITOR) 20 MG tablet Take 20 mg by mouth daily.      Marland Kitchen azithromycin (ZITHROMAX) 250 MG tablet       . cetirizine (ZYRTEC) 10 MG tablet Take 10 mg by mouth daily.      Marland Kitchen dexamethasone (DECADRON) 4 MG tablet Take 2 tablets by mouth once a day on the day after chemotherapy and then take 2 tablets two times a day for 2 days. Take with food.  30 tablet  1  . FROVA 2.5 MG tablet Take 2.5 mg by mouth daily as needed for migraine.       . lidocaine-prilocaine (EMLA) cream Apply 1 application topically as needed. Apply over port site 1-2 hours before  chemotherapy and cover with plastic wrap  30 g  0  . LORazepam (ATIVAN) 0.5 MG tablet Take 1 tablet (0.5 mg total) by mouth at bedtime as needed (Nausea or vomiting).  30 tablet  0  . mometasone (NASONEX) 50 MCG/ACT nasal spray Place 2 sprays into the nose daily as needed (for allergies).       Marland Kitchen omeprazole (PRILOSEC) 20 MG capsule Take 1 capsule (20 mg total) by mouth 2 (two) times daily before a meal.  60 capsule  3  . ondansetron (ZOFRAN) 8 MG tablet Take 1 tablet (8 mg total) by mouth 2 (two) times daily as needed. Start on the third day after chemotherapy.  30 tablet  1  . oxyCODONE-acetaminophen (PERCOCET/ROXICET) 5-325  MG per tablet       . oxymetazoline (AFRIN) 0.05 % nasal spray Place 2 sprays into the nose daily as needed. congestion      . PARoxetine (PAXIL) 20 MG tablet Take 20 mg by mouth every morning.      . polyethylene glycol-electrolytes (NULYTELY/GOLYTELY) 420 G solution       . predniSONE (DELTASONE) 20 MG tablet       . prochlorperazine (COMPAZINE) 10 MG tablet Take 1 tablet (10 mg total) by mouth every 6 (six) hours as needed (Nausea or vomiting).  30 tablet  1  . RELPAX 40 MG tablet       . Vitamin D, Ergocalciferol, (DRISDOL) 50000 UNITS CAPS capsule Take 50,000 Units by mouth every 7 (seven) days.        No current facility-administered medications for this visit.    OBJECTIVE: Middle-aged white woman who appears stated age 66 Vitals:   09/14/13 0947  BP: 139/69  Pulse: 108  Temp: 98.8 F (37.1 C)  Resp: 18     Body mass index is 34.13 kg/(m^2).    ECOG FS:1 - Symptomatic but completely ambulatory  Sclerae unicteric, pupils round and reactive Oropharynx clear and moist, no lesions noted No cervical or supraclavicular adenopathy Lungs no rales or rhonchi Heart regular rate and rhythm Abd soft, obese, nontender, positive bowel sounds MSK no focal spinal tenderness, no upper extremity lymphedema Neuro: nonfocal, well oriented, positive affect Breasts: The  right breast is unremarkable. I do not palpate a clearly defined mass in the left breast. There are no skin or nipple changes of concern. The left axilla is benign.  LAB RESULTS:  CMP     Component Value Date/Time   NA 143 08/31/2013 1050   K 4.3 08/31/2013 1050   CO2 24 08/31/2013 1050   GLUCOSE 165* 08/31/2013 1050   BUN 13.1 08/31/2013 1050   CREATININE 0.8 08/31/2013 1050   CALCIUM 9.6 08/31/2013 1050   PROT 6.9 08/31/2013 1050   ALBUMIN 4.0 08/31/2013 1050   AST 19 08/31/2013 1050   ALT 37 08/31/2013 1050   ALKPHOS 89 08/31/2013 1050   BILITOT 0.44 08/31/2013 1050    I No results found for this basename: SPEP,  UPEP,   kappa and lambda light chains    Lab Results  Component Value Date   WBC 22.9* 09/14/2013   NEUTROABS 18.6* 09/14/2013   HGB 11.3* 09/14/2013   HCT 34.9 09/14/2013   MCV 85.1 09/14/2013   PLT 181 09/14/2013      Chemistry      Component Value Date/Time   NA 143 08/31/2013 1050   K 4.3 08/31/2013 1050   CO2 24 08/31/2013 1050   BUN 13.1 08/31/2013 1050   CREATININE 0.8 08/31/2013 1050      Component Value Date/Time   CALCIUM 9.6 08/31/2013 1050   ALKPHOS 89 08/31/2013 1050   AST 19 08/31/2013 1050   ALT 37 08/31/2013 1050   BILITOT 0.44 08/31/2013 1050       No results found for this basename: LABCA2    No components found with this basename: [id number]    No results found for this basename: INR,  in the last 168 hours  Urinalysis    Component Value Date/Time   BILIRUBINUR Negative 09/22/2011 1451   PROTEINUR Trace 09/22/2011 1451   UROBILINOGEN negative 09/22/2011 1451   NITRITE Negative 09/22/2011 1451   LEUKOCYTESUR Trace 09/22/2011 1451    STUDIES: No results found.   ASSESSMENT:  48 y.o. [place] woman status post left breast and left axillary lymph node biopsy 06/30/2013 for a clinical T2 N1-2, stage IIB/IIIA invasive ductal carcinoma, grade 2, estrogen and progesterone receptors both strongly positive, HER-2 nonamplified, with an MIB-1 of 36%.  (1) there is a 5 mm left  internal mammary lymph node which was negative on PET scan  (2) cyclophosphamide and doxorubicin started 08/04/2014, given in dose dense fashion x4 with Neulasta day 2; completed 09/14/2013, to be followed by paclitaxel again given in dose dense fashion x4 with Neulasta support  PLAN: Briget is completing the first part of her chemotherapy today, namely the fourth of 4 planned cycles of doxorubicin and cyclophosphamide. She will see me in one week and at that time we will discuss the possible toxicities, side effects and complications of paclitaxel, particularly issues relating to neuropathy and nail dyscrasias.  I think carries headaches are going to be due to sinus problems, but she is already on Claritin and they have not cleared. These are clearly different from her migraines. I am going to go ahead and get a brain MRI just for reassurance and to make sure we don't have to fight on a second front.  I am adding gabapentin to her nighttime medication. I think this will allow her to sleep a little better with your nighttime hot flashes.  She has a good understanding of the overall plan. She knows to call for any problems that may develop before her next visit here.   Chauncey Cruel, MD   09/14/2013 9:58 AM

## 2013-09-14 NOTE — Progress Notes (Signed)
Postive blood return before, during and after the adriamycin push.

## 2013-09-14 NOTE — Addendum Note (Signed)
Addended by: Laureen Abrahams on: 09/14/2013 04:32 PM   Modules accepted: Medications

## 2013-09-14 NOTE — Patient Instructions (Signed)
[facility] Discharge Instructions for Patients Receiving Chemotherapy  Today you received the following chemotherapy agents : Adriamycin and Cytoxan  To help prevent nausea and vomiting after your treatment, we encourage you to take your nausea medications as directed:  Decadron 8 mg once on 4/17, then twice daily x 2 days  Ativan 0.5 mg every 6 hours as needed  Decadron 8 mg every 12 hours as needed (start on day 3)  Compazine 10 mg every 6 hours as needed If you develop nausea and vomiting that is not controlled by your nausea medication, call the clinic.   BELOW ARE SYMPTOMS THAT SHOULD BE REPORTED IMMEDIATELY:  *FEVER GREATER THAN 100.5 F  *CHILLS WITH OR WITHOUT FEVER  NAUSEA AND VOMITING THAT IS NOT CONTROLLED WITH YOUR NAUSEA MEDICATION  *UNUSUAL SHORTNESS OF BREATH  *UNUSUAL BRUISING OR BLEEDING  TENDERNESS IN MOUTH AND THROAT WITH OR WITHOUT PRESENCE OF ULCERS  *URINARY PROBLEMS  *BOWEL PROBLEMS  UNUSUAL RASH Items with * indicate a potential emergency and should be followed up as soon as possible.  Feel free to call the clinic should you have any questions or concerns. The clinic phone number is [phone].  It has been a pleasure to serve you today !

## 2013-09-15 ENCOUNTER — Ambulatory Visit (HOSPITAL_BASED_OUTPATIENT_CLINIC_OR_DEPARTMENT_OTHER): Payer: BC Managed Care – PPO

## 2013-09-15 VITALS — BP 129/59 | HR 88 | Temp 98.5°F

## 2013-09-15 DIAGNOSIS — C50312 Malignant neoplasm of lower-inner quadrant of left female breast: Secondary | ICD-10-CM

## 2013-09-15 DIAGNOSIS — C50319 Malignant neoplasm of lower-inner quadrant of unspecified female breast: Secondary | ICD-10-CM

## 2013-09-15 DIAGNOSIS — Z5189 Encounter for other specified aftercare: Secondary | ICD-10-CM

## 2013-09-15 MED ORDER — PEGFILGRASTIM INJECTION 6 MG/0.6ML
6.0000 mg | Freq: Once | SUBCUTANEOUS | Status: AC
  Administered 2013-09-15: 6 mg via SUBCUTANEOUS
  Filled 2013-09-15: qty 0.6

## 2013-09-21 ENCOUNTER — Ambulatory Visit (HOSPITAL_BASED_OUTPATIENT_CLINIC_OR_DEPARTMENT_OTHER): Payer: BC Managed Care – PPO | Admitting: Oncology

## 2013-09-21 ENCOUNTER — Encounter: Payer: Self-pay | Admitting: *Deleted

## 2013-09-21 ENCOUNTER — Other Ambulatory Visit (HOSPITAL_BASED_OUTPATIENT_CLINIC_OR_DEPARTMENT_OTHER): Payer: BC Managed Care – PPO

## 2013-09-21 VITALS — BP 118/74 | HR 109 | Temp 98.8°F | Resp 18 | Ht 64.5 in | Wt 200.9 lb

## 2013-09-21 DIAGNOSIS — E7800 Pure hypercholesterolemia, unspecified: Secondary | ICD-10-CM

## 2013-09-21 DIAGNOSIS — R509 Fever, unspecified: Secondary | ICD-10-CM

## 2013-09-21 DIAGNOSIS — R05 Cough: Secondary | ICD-10-CM

## 2013-09-21 DIAGNOSIS — C50319 Malignant neoplasm of lower-inner quadrant of unspecified female breast: Secondary | ICD-10-CM

## 2013-09-21 DIAGNOSIS — R059 Cough, unspecified: Secondary | ICD-10-CM

## 2013-09-21 DIAGNOSIS — Z170 Estrogen receptor positive status [ER+]: Secondary | ICD-10-CM

## 2013-09-21 DIAGNOSIS — D702 Other drug-induced agranulocytosis: Secondary | ICD-10-CM

## 2013-09-21 DIAGNOSIS — C50312 Malignant neoplasm of lower-inner quadrant of left female breast: Secondary | ICD-10-CM

## 2013-09-21 LAB — CBC WITH DIFFERENTIAL/PLATELET
BASO%: 3.3 % — ABNORMAL HIGH (ref 0.0–2.0)
Basophils Absolute: 0 10*3/uL (ref 0.0–0.1)
EOS%: 0.8 % (ref 0.0–7.0)
Eosinophils Absolute: 0 10*3/uL (ref 0.0–0.5)
HCT: 28.1 % — ABNORMAL LOW (ref 34.8–46.6)
HGB: 9.5 g/dL — ABNORMAL LOW (ref 11.6–15.9)
LYMPH%: 37.5 % (ref 14.0–49.7)
MCH: 27.5 pg (ref 25.1–34.0)
MCHC: 33.8 g/dL (ref 31.5–36.0)
MCV: 81.4 fL (ref 79.5–101.0)
MONO#: 0.2 10*3/uL (ref 0.1–0.9)
MONO%: 19.2 % — ABNORMAL HIGH (ref 0.0–14.0)
NEUT#: 0.5 10*3/uL — CL (ref 1.5–6.5)
NEUT%: 39.2 % (ref 38.4–76.8)
Platelets: 121 10*3/uL — ABNORMAL LOW (ref 145–400)
RBC: 3.45 10*6/uL — ABNORMAL LOW (ref 3.70–5.45)
RDW: 14.8 % — ABNORMAL HIGH (ref 11.2–14.5)
WBC: 1.2 10*3/uL — ABNORMAL LOW (ref 3.9–10.3)
lymph#: 0.5 10*3/uL — ABNORMAL LOW (ref 0.9–3.3)

## 2013-09-21 MED ORDER — ALPRAZOLAM 0.5 MG PO TABS: 0.5000 mg | ORAL_TABLET | Freq: Two times a day (BID) | ORAL | Status: DC | PRN

## 2013-09-21 NOTE — Progress Notes (Signed)
[facility]  [phone] [phone]) [phone]     ID: [name] OB: Sep 08, 1965  MR#: [id number]  UVO#:[id number]  PCP: [name], MD GYN:  [name]: [name] OTHER MD: [name], De Nurse  CHIEF COMPLAINT: Active chemotherapy for breast cancer  HISTORY OF PRESENT ILLNESS: Kharter had routine yearly screening mammography at the breast center 06/19/2013 showing a possible mass in the left breast. The right breast was unremarkable. On 06/30/2013 additional views of the left breast showed an irregular spiculated dense mass measuring 2.2 cm in the lower inner quadrant. This was palpable at the 8:00 location. Ultrasound showed an irregular hypoechoic mass measuring 2.3 cm and in the left axilla a dominant abnormal appearing lymph node with cortical thickening measuring 1.2 cm.  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  [id number] the patient underwent bilateral breast MRI. This showed her breasts to be dense (composition C.). In the left breast at the 8:00 position there was a 2.4 cm enhancing mass containing a biopsy clip. There were no additional areas of concern. In the axilla on the left there was a 2.6 cm enlarged left axillary lymph node. There was also a 0.5 cm left internal mammary lymph nodes noted. The right side was unremarkable.  The patient's subsequent history is as detailed below.  INTERVAL HISTORY: Chrisoula returns today for followup of her breast cancer. Today is day 8 cycle 4 of 4 planned cycles of cyclophosphamide and doxorubicin, to be  followed by Taxol again in dose dense fashion. She receives Neulasta on day 2. The interval history is significant for her son, who shaved his head in sympathy with her, finding a mole on his scalp which is can be evaluated by the dermatologist today. Accordingly she is in hurry to get through our visit it, some point.   REVIEW OF SYSTEMS: Latissa continues to have headaches. She does have a history of migraines, and she tells me the only medicine that really works for her is frovatriptan, which her insurance refuses to pay for. She is trying a serious alternatives but so far they have not controlled her headaches. She doesn't have significant sinus symptoms. She does have some blurred vision which is going to be due to this to her as she receives and premeds. There has been no gait imbalance, no nausea or vomiting, no cough, phlegm production, or pleurisy, no chest pain or pressure, and no change in bladder habits. She had a small diarrheal bowel movement last night. A detailed review of systems today was otherwise noncontributory  PAST MEDICAL HISTORY: Past Medical History  Diagnosis Date  . MVP (mitral valve prolapse)   . IBS (irritable bowel syndrome)   . Migraines     menstral  . Hyperlipidemia   . MVP (mitral valve prolapse)   . Migraines   . IBS (irritable bowel syndrome)   . Abnormal Pap smear   . Seasonal allergies   . Anxiety   . Menorrhagia   . Breast cancer 2015    ER+/PR+/Her2-    PAST SURGICAL HISTORY: Past Surgical History  Procedure Laterality Date  .  Wisdom teeth removal    . Vulva /perineum biopsy  2011  . Svd       x 3  . Endometrial ablation    . Portacath placement Left 07/18/2013    Procedure: INSERTION PORT-A-CATH;  Surgeon: [name], MD;  Location: [facility] OR;  Service: General;  Laterality: Left;    FAMILY HISTORY Family History  Problem Relation Age of Onset  . Cancer Maternal Grandmother     BREAST AND UTERINE CANCER  . Uterine cancer Maternal  Grandmother 84  . Hyperlipidemia Mother   . Hypertension Mother   . Multiple myeloma Father 44  . Lung cancer Maternal Development worker, international aid  . Testicular cancer Other 47  The patient's father is living, currently age 65. He has a history of multiple myeloma. The patient's mother is 75. The patient's parents live in [place] The patient had no brothers, 2 sisters. There is no history of breast or ovarian cancer in the family. She believes one of her grandfathers died from lung cancer and another from pancreatic cancer, but is not sure  GYNECOLOGIC HISTORY:  Menarche age 3, first live birth age 85, which the patient understands is an independent risk factor for breast cancer. She is GX P3. She still having regular periods. 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 Her husband [name] is a Energy manager at [employer]. Her 3 children are [name] (14), [name] (10) and [name] (7). The patient attends a local [employer]: Not in place   HEALTH MAINTENANCE: History  Substance Use Topics  . Smoking status: Never Smoker   . Smokeless tobacco: Never Used  . Alcohol Use: No     Colonoscopy: January 2015  NWG:[id number] 2014  Bone density:  Lipid panel:  Allergies  Allergen Reactions  . Dust Mite Extract Itching    Current Outpatient Prescriptions  Medication Sig Dispense Refill  . ALPRAZolam (XANAX) 0.5 MG tablet       . atorvastatin (LIPITOR) 20 MG tablet Take 20 mg by mouth daily.      Marland Kitchen azithromycin (ZITHROMAX) 250 MG tablet       . cetirizine (ZYRTEC) 10 MG tablet Take 10 mg by mouth daily.      Marland Kitchen dexamethasone (DECADRON) 4 MG tablet Take 2 tablets by mouth once a day on the day after chemotherapy and then take 2 tablets two times a day for 2 days. Take with food.  30 tablet  1  . FROVA 2.5 MG tablet Take 2.5 mg by mouth daily as needed for  migraine.       . gabapentin (NEURONTIN) 300 MG capsule Take 1 capsule (300 mg total) by mouth at bedtime.  30 capsule  6  . lidocaine-prilocaine (EMLA) cream Apply 1 application topically as needed. Apply over port site 1-2 hours before chemotherapy and cover with plastic wrap  30 g  0  . LORazepam (ATIVAN) 0.5 MG tablet Take 1 tablet (0.5 mg total) by mouth at bedtime as needed (Nausea or vomiting).  30 tablet  0  . mometasone (NASONEX) 50 MCG/ACT nasal spray Place 2 sprays into the nose daily as needed (for allergies).       Marland Kitchen omeprazole (PRILOSEC) 20 MG capsule Take 1 capsule (20 mg total) by mouth 2 (two) times daily before a meal.  60 capsule  3  .  ondansetron (ZOFRAN) 8 MG tablet Take 1 tablet (8 mg total) by mouth 2 (two) times daily as needed. Start on the third day after chemotherapy.  30 tablet  1  . oxyCODONE-acetaminophen (PERCOCET/ROXICET) 5-325 MG per tablet       . oxymetazoline (AFRIN) 0.05 % nasal spray Place 2 sprays into the nose daily as needed. congestion      . PARoxetine (PAXIL) 20 MG tablet Take 20 mg by mouth every morning.      . polyethylene glycol-electrolytes (NULYTELY/GOLYTELY) 420 G solution       . predniSONE (DELTASONE) 20 MG tablet       . prochlorperazine (COMPAZINE) 10 MG tablet Take 1 tablet (10 mg total) by mouth every 6 (six) hours as needed (Nausea or vomiting).  30 tablet  1  . RELPAX 40 MG tablet       . Vitamin D, Ergocalciferol, (DRISDOL) 50000 UNITS CAPS capsule Take 50,000 Units by mouth every 7 (seven) days.        No current facility-administered medications for this visit.    OBJECTIVE: Middle-aged white woman in no acute distress Filed Vitals:   09/21/13 1402  BP: 118/74  Pulse: 109  Temp: 98.8 F (37.1 C)  Resp: 18     Body mass index is 33.96 kg/(m^2).    ECOG FS:0 - Asymptomatic  Sclerae unicteric, pupils round and equal Oropharynx clear and moist, slight erosion on the roof of the mouth next to the torus noted No cervical or  supraclavicular adenopathy Lungs no rales or rhonchi Heart regular rate and rhythm Abd soft, obese, nontender, positive bowel sounds MSK no focal spinal tenderness, no upper extremity lymphedema Neuro: nonfocal, well oriented, positive affect Breasts: Deferred  LAB RESULTS:  CMP     Component Value Date/Time   NA 143 08/31/2013 1050   K 4.3 08/31/2013 1050   CO2 24 08/31/2013 1050   GLUCOSE 165* 08/31/2013 1050   BUN 13.1 08/31/2013 1050   CREATININE 0.8 08/31/2013 1050   CALCIUM 9.6 08/31/2013 1050   PROT 6.9 08/31/2013 1050   ALBUMIN 4.0 08/31/2013 1050   AST 19 08/31/2013 1050   ALT 37 08/31/2013 1050   ALKPHOS 89 08/31/2013 1050   BILITOT 0.44 08/31/2013 1050    I No results found for this basename: SPEP,  UPEP,   kappa and lambda light chains    Lab Results  Component Value Date   WBC 1.2* 09/21/2013   NEUTROABS 0.5* 09/21/2013   HGB 9.5* 09/21/2013   HCT 28.1* 09/21/2013   MCV 81.4 09/21/2013   PLT 121* 09/21/2013      Chemistry      Component Value Date/Time   NA 143 08/31/2013 1050   K 4.3 08/31/2013 1050   CO2 24 08/31/2013 1050   BUN 13.1 08/31/2013 1050   CREATININE 0.8 08/31/2013 1050      Component Value Date/Time   CALCIUM 9.6 08/31/2013 1050   ALKPHOS 89 08/31/2013 1050   AST 19 08/31/2013 1050   ALT 37 08/31/2013 1050   BILITOT 0.44 08/31/2013 1050       No results found for this basename: LABCA2    No components found with this basename: LABCA125    No results found for this basename: INR,  in the last 168 hours  Urinalysis    Component Value Date/Time   BILIRUBINUR Negative 09/22/2011 1451   PROTEINUR Trace 09/22/2011 1451   UROBILINOGEN negative 09/22/2011 1451   NITRITE Negative 09/22/2011 1451   LEUKOCYTESUR  Trace 09/22/2011 1451    STUDIES: No results found.   ASSESSMENT: 48 y.o. [place] woman status post left breast and left axillary lymph node biopsy 06/30/2013 for a clinical T2 N1-2, stage IIB/IIIA invasive ductal carcinoma, grade 2, estrogen and progesterone  receptors both strongly positive, HER-2 nonamplified, with an MIB-1 of 36%.  (1) there is a 5 mm left internal mammary lymph node which was negative on PET scan  (2) cyclophosphamide and doxorubicin started 08/04/2014, given in dose dense fashion x4 with Neulasta day 2; completed 09/14/2013, to be followed by paclitaxel again given in dose dense fashion x4 with Neulasta support  PLAN: Faythe did very well with the first part of her chemotherapy and hopefully will do even better with the second part, which is generally easier since it is only one drug instead of 2. She will receive the paclitaxel every 2 weeks, in dose dense fashion, with Neulasta support on day 2. She has a good understanding of the possible toxicities, side effects and complications of this agent as well as the possible benefits. In particular she is aware of the risk of peripheral neuropathy. At present she has non-and she will alerted [place] if any develops.  She will also call just a temperature with 100 or greater. In the meantime I have reminded her to make sure to keep her hands watched and stay away from people who are sick.  She will see [place] again in one week. She will start her Taxol at that time. Before that visit we will obtain a breast MRI to assess response so far.  Mollie has a good understanding of the overall plan. She agrees with that. She knows to call for any problems that may develop before the next visit here.   Chauncey Cruel, MD   09/21/2013 2:39 PM

## 2013-09-21 NOTE — [facility] Oncology Navigator Note (Signed)
Patient at [facility] for labs and f/u appointment with Dr. Jana Hakim.  Patient reports that she is doing well.  She does continue to be fatigued and continues to experience frequent diarrhea.  She also has 2 small sore and slightly red areas on her tongue.  She continues to have headaches and we reviewed the schedule for her Brain MRI on 10/02/13.  Patient reports that she received notice from her insurance company that they will no longer cover the medication she uses to treat her migraines.  She asked if there were any resources to assist her with her medication.  I discussed with the financial advocate to recommended she contact her PCP which patient reports she has already done.  I encouraged her to contact the drug manufacturer and explain her situation and see if they can offer some assistance.  I also told her that I would assist her to contact her insurance company to discuss filing an appeal.  Patient reports that she does feel that her breast mass is a little smaller but asked if another scan will be performed to check for response. I encouraged her to discuss with Dr. Jana Hakim.  Patient denied any other questions or concerns at this time.  I encouraged her to call me for any needs.

## 2013-09-22 ENCOUNTER — Telehealth: Payer: Self-pay | Admitting: Oncology

## 2013-09-22 NOTE — Telephone Encounter (Signed)
lmonvm for pt re next appt for 4/30 and mailed schedule. also pt given info to contact [facility] re breast mri.

## 2013-09-28 ENCOUNTER — Telehealth: Payer: Self-pay | Admitting: Oncology

## 2013-09-28 ENCOUNTER — Other Ambulatory Visit: Payer: Self-pay | Admitting: *Deleted

## 2013-09-28 ENCOUNTER — Ambulatory Visit (HOSPITAL_BASED_OUTPATIENT_CLINIC_OR_DEPARTMENT_OTHER): Payer: BC Managed Care – PPO | Admitting: Oncology

## 2013-09-28 ENCOUNTER — Ambulatory Visit (HOSPITAL_BASED_OUTPATIENT_CLINIC_OR_DEPARTMENT_OTHER): Payer: BC Managed Care – PPO

## 2013-09-28 ENCOUNTER — Encounter: Payer: Self-pay | Admitting: *Deleted

## 2013-09-28 ENCOUNTER — Ambulatory Visit: Payer: BC Managed Care – PPO

## 2013-09-28 ENCOUNTER — Other Ambulatory Visit (HOSPITAL_BASED_OUTPATIENT_CLINIC_OR_DEPARTMENT_OTHER): Payer: BC Managed Care – PPO

## 2013-09-28 VITALS — BP 129/77 | HR 92 | Temp 98.9°F | Resp 18

## 2013-09-28 VITALS — BP 143/80 | HR 120 | Temp 98.3°F | Resp 20 | Ht 64.5 in | Wt 202.2 lb

## 2013-09-28 DIAGNOSIS — C50312 Malignant neoplasm of lower-inner quadrant of left female breast: Secondary | ICD-10-CM

## 2013-09-28 DIAGNOSIS — C50319 Malignant neoplasm of lower-inner quadrant of unspecified female breast: Secondary | ICD-10-CM

## 2013-09-28 DIAGNOSIS — Z170 Estrogen receptor positive status [ER+]: Secondary | ICD-10-CM

## 2013-09-28 DIAGNOSIS — E7800 Pure hypercholesterolemia, unspecified: Secondary | ICD-10-CM

## 2013-09-28 DIAGNOSIS — Z5111 Encounter for antineoplastic chemotherapy: Secondary | ICD-10-CM

## 2013-09-28 LAB — CBC WITH DIFFERENTIAL/PLATELET
BASO%: 2.2 % — ABNORMAL HIGH (ref 0.0–2.0)
Basophils Absolute: 0.5 10*3/uL — ABNORMAL HIGH (ref 0.0–0.1)
EOS%: 0.4 % (ref 0.0–7.0)
Eosinophils Absolute: 0.1 10*3/uL (ref 0.0–0.5)
HCT: 31.3 % — ABNORMAL LOW (ref 34.8–46.6)
HGB: 10.3 g/dL — ABNORMAL LOW (ref 11.6–15.9)
LYMPH%: 6.7 % — ABNORMAL LOW (ref 14.0–49.7)
MCH: 27.5 pg (ref 25.1–34.0)
MCHC: 32.9 g/dL (ref 31.5–36.0)
MCV: 83.7 fL (ref 79.5–101.0)
MONO#: 1.6 10*3/uL — ABNORMAL HIGH (ref 0.1–0.9)
MONO%: 6.9 % (ref 0.0–14.0)
NEUT#: 19.5 10*3/uL — ABNORMAL HIGH (ref 1.5–6.5)
NEUT%: 83.8 % — ABNORMAL HIGH (ref 38.4–76.8)
Platelets: 196 10*3/uL (ref 145–400)
RBC: 3.74 10*6/uL (ref 3.70–5.45)
RDW: 16.9 % — ABNORMAL HIGH (ref 11.2–14.5)
WBC: 23.3 10*3/uL — ABNORMAL HIGH (ref 3.9–10.3)
lymph#: 1.6 10*3/uL (ref 0.9–3.3)
nRBC: 2 % — ABNORMAL HIGH (ref 0–0)

## 2013-09-28 MED ORDER — PROCHLORPERAZINE MALEATE 10 MG PO TABS: 10.0000 mg | ORAL_TABLET | Freq: Four times a day (QID) | ORAL | Status: DC | PRN

## 2013-09-28 MED ORDER — ONDANSETRON 8 MG/NS 50 ML IVPB
INTRAVENOUS | Status: AC
  Filled 2013-09-28: qty 8

## 2013-09-28 MED ORDER — DIPHENHYDRAMINE HCL 50 MG/ML IJ SOLN
25.0000 mg | Freq: Once | INTRAMUSCULAR | Status: AC
  Administered 2013-09-28: 25 mg via INTRAVENOUS

## 2013-09-28 MED ORDER — PACLITAXEL CHEMO INJECTION 300 MG/50ML
175.0000 mg/m2 | Freq: Once | INTRAVENOUS | Status: AC
  Administered 2013-09-28: 360 mg via INTRAVENOUS
  Filled 2013-09-28: qty 60

## 2013-09-28 MED ORDER — HEPARIN SOD (PORK) LOCK FLUSH 100 UNIT/ML IV SOLN
500.0000 [IU] | Freq: Once | INTRAVENOUS | Status: AC | PRN
  Administered 2013-09-28: 500 [IU]
  Filled 2013-09-28: qty 5

## 2013-09-28 MED ORDER — DEXAMETHASONE SODIUM PHOSPHATE 20 MG/5ML IJ SOLN
20.0000 mg | Freq: Once | INTRAMUSCULAR | Status: AC
  Administered 2013-09-28: 20 mg via INTRAVENOUS

## 2013-09-28 MED ORDER — FAMOTIDINE IN NACL 20-0.9 MG/50ML-% IV SOLN
INTRAVENOUS | Status: AC
  Filled 2013-09-28: qty 50

## 2013-09-28 MED ORDER — SODIUM CHLORIDE 0.9 % IV SOLN
Freq: Once | INTRAVENOUS | Status: AC
  Administered 2013-09-28: 12:00:00 via INTRAVENOUS

## 2013-09-28 MED ORDER — SODIUM CHLORIDE 0.9 % IJ SOLN
10.0000 mL | INTRAMUSCULAR | Status: DC | PRN
  Administered 2013-09-28: 10 mL
  Filled 2013-09-28: qty 10

## 2013-09-28 MED ORDER — DEXAMETHASONE SODIUM PHOSPHATE 20 MG/5ML IJ SOLN
INTRAMUSCULAR | Status: AC
  Filled 2013-09-28: qty 5

## 2013-09-28 MED ORDER — FAMOTIDINE IN NACL 20-0.9 MG/50ML-% IV SOLN
20.0000 mg | Freq: Once | INTRAVENOUS | Status: AC
  Administered 2013-09-28: 20 mg via INTRAVENOUS

## 2013-09-28 MED ORDER — DIPHENHYDRAMINE HCL 50 MG/ML IJ SOLN
INTRAMUSCULAR | Status: AC
  Filled 2013-09-28: qty 1

## 2013-09-28 MED ORDER — ONDANSETRON 8 MG/50ML IVPB (CHCC)
8.0000 mg | Freq: Once | INTRAVENOUS | Status: AC
  Administered 2013-09-28: 8 mg via INTRAVENOUS

## 2013-09-28 NOTE — Telephone Encounter (Signed)
per pof to sch MR-cld made appt-printed pt a sch-adv GI will call her prior to app-pt understood

## 2013-09-28 NOTE — Progress Notes (Signed)
Debbie [name]  Telephone:(336) [phone] Fax:(336) [phone]     ID: Debbie [name] OB: 13-Aug-1965  MR#: [id number]  NTZ#:[id number]  PCP: [name], MD GYN:  [name]: [name] OTHER MD: [name], De Nurse  CHIEF COMPLAINT: Active chemotherapy for breast cancer  HISTORY OF PRESENT ILLNESS: Debbie [name] had routine yearly screening mammography at the breast center 06/19/2013 showing a possible mass in the left breast. The right breast was unremarkable. On 06/30/2013 additional views of the left breast showed an irregular spiculated dense mass measuring 2.2 cm in the lower inner quadrant. This was palpable at the 8:00 location. Ultrasound showed an irregular hypoechoic mass measuring 2.3 cm and in the left axilla a dominant abnormal appearing lymph node with cortical thickening measuring 1.2 cm.  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  [id number] the patient underwent bilateral breast MRI. This showed her breasts to be dense (composition C.). In the left breast at the 8:00 position there was a 2.4 cm enhancing mass containing a biopsy clip. There were no additional areas of concern. In the axilla on the left there was a 2.6 cm enlarged left axillary lymph node. There was also a 0.5 cm left internal mammary lymph nodes noted. The right side was unremarkable.  The patient's subsequent history is as detailed below.  INTERVAL HISTORY: Debbie [name] returns today for followup of her breast cancer accompanied by her friend and neighbor, Debbie [name]. Today is day 1 cycle 1 of 4 planned  dose dense cycles of paclitaxel. She will receive Neulasta on day 2 of each cycle.   REVIEW OF SYSTEMS: Debbie [name] is doing well overall. Her hot flashes are now much better control. She is sleeping better. She never had any significant problems with nausea or vomiting. She has more energy. She was supposed to have had a breast MRI to document response but somehow that was not done. She is scheduled for a brain MRI, and she does have mild but persistent headaches which need to be evaluated. Let us Mr. period was 2 months ago. Aside from that a detailed review of systems today was noncontributory  PAST MEDICAL HISTORY: Past Medical History  Diagnosis Date  . MVP (mitral valve prolapse)   . IBS (irritable bowel syndrome)   . Migraines     menstral  . Hyperlipidemia   . MVP (mitral valve prolapse)   . Migraines   . IBS (irritable bowel syndrome)   . Abnormal Pap smear   . Seasonal allergies   . Anxiety   . Menorrhagia   . Breast cancer 2015    ER+/PR+/Her2-    PAST SURGICAL HISTORY: Past Surgical History  Procedure Laterality Date  . Wisdom teeth removal    . Vulva /perineum biopsy  2011  . Svd       x 3  . Endometrial ablation    . Portacath placement Left 07/18/2013    Procedure: INSERTION PORT-A-CATH;  Surgeon: [name], MD;  Location: [facility] OR;  Service: General;  Laterality: Left;    FAMILY HISTORY Family History  Problem Relation Age of Onset  . Cancer Maternal  Grandmother     BREAST AND UTERINE CANCER  . Uterine cancer Maternal Grandmother 84  . Hyperlipidemia Mother   . Hypertension Mother   . Multiple myeloma Father 90  . Lung cancer Maternal Development worker, international aid  . Testicular cancer Other 70  The patient's father is living, currently age 27. He has a history of multiple myeloma. The patient's mother is 52. The patient's parents live in [place] The patient had no brothers, 2 sisters. There is no history of breast or ovarian cancer in the family. She believes  one of her grandfathers died from lung cancer and another from pancreatic cancer, but is not sure  GYNECOLOGIC HISTORY:  Menarche age 14, first live birth age 28, which the patient understands is an independent risk factor for breast cancer. She is GX P3. She still having regular periods. 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 Her husband Debbie [name] is a Energy manager at [employer]. Her 3 children are Debbie [name] (14), Debbie [name] (10) and Debbie [name] (7). The patient attends a local [employer]: Not in place   HEALTH MAINTENANCE: History  Substance Use Topics  . Smoking status: Never Smoker   . Smokeless tobacco: Never Used  . Alcohol Use: No     Colonoscopy: January 2015  SFK:CLEXNTZG 2014  Bone density:  Lipid panel:  Allergies  Allergen Reactions  . Dust Mite Extract Itching    Current Outpatient Prescriptions  Medication Sig Dispense Refill  . ALPRAZolam (XANAX) 0.5 MG tablet Take 1 tablet (0.5 mg total) by mouth 2 (two) times daily as needed for anxiety.  10 tablet  0  . atorvastatin (LIPITOR) 20 MG tablet Take 20 mg by mouth daily.      Marland Kitchen azithromycin (ZITHROMAX) 250 MG tablet       . cetirizine (ZYRTEC) 10 MG tablet Take 10 mg by mouth daily.      Marland Kitchen dexamethasone (DECADRON) 4 MG tablet Take 2 tablets by mouth once a day on the day after chemotherapy and then take 2 tablets two times a day for 2 days. Take with food.  30 tablet  1  . FROVA 2.5 MG tablet Take 2.5 mg by mouth daily as needed for migraine.       . gabapentin (NEURONTIN) 300 MG capsule Take 1 capsule (300 mg total) by mouth at bedtime.  30 capsule  6  . lidocaine-prilocaine (EMLA) cream Apply 1 application topically as needed. Apply over port site 1-2 hours before chemotherapy and cover with plastic wrap  30 g  0  . LORazepam (ATIVAN) 0.5 MG tablet Take 1 tablet (0.5 mg total) by mouth at bedtime  as needed (Nausea or vomiting).  30 tablet  0  . mometasone (NASONEX) 50 MCG/ACT nasal spray Place 2 sprays into the nose daily as needed (for allergies).       Marland Kitchen omeprazole (PRILOSEC) 20 MG capsule Take 1 capsule (20 mg total) by mouth 2 (two) times daily before a meal.  60 capsule  3  . ondansetron (ZOFRAN) 8 MG tablet Take 1 tablet (8 mg total) by mouth 2 (two) times daily as needed. Start on the third day after chemotherapy.  30 tablet  1  . oxyCODONE-acetaminophen (PERCOCET/ROXICET) 5-325 MG per tablet       . oxymetazoline (AFRIN) 0.05 % nasal spray Place 2 sprays into the  nose daily as needed. congestion      . PARoxetine (PAXIL) 20 MG tablet Take 20 mg by mouth every morning.      . polyethylene glycol-electrolytes (NULYTELY/GOLYTELY) 420 G solution       . predniSONE (DELTASONE) 20 MG tablet       . prochlorperazine (COMPAZINE) 10 MG tablet Take 1 tablet (10 mg total) by mouth every 6 (six) hours as needed (Nausea or vomiting).  30 tablet  1  . RELPAX 40 MG tablet       . Vitamin D, Ergocalciferol, (DRISDOL) 50000 UNITS CAPS capsule Take 50,000 Units by mouth every 7 (seven) days.        No current facility-administered medications for this visit.    OBJECTIVE: Middle-aged white woman  who appears stated age  48 Vitals:   09/28/13 1100  BP: 143/80  Pulse: 120  Temp: 98.3 F (36.8 C)  Resp: 20     Body mass index is 34.18 kg/(m^2).    ECOG FS:0 - Asymptomatic  Sclerae unicteric, pupils  round and reactive  Oropharynx clear and moist; I do not note any lesions although she feels a couple of lesions on her tongue.  No cervical or supraclavicular adenopathy Lungs no rales or rhonchi Heart regular rate and rhythm Abd soft, obese, nontender, positive bowel sounds MSK no focal spinal tenderness, no upper extremity lymphedema Neuro: nonfocal, well oriented, positive affect Breasts: Deferred  LAB RESULTS:  CMP     Component Value Date/Time   NA 143 08/31/2013 1050   K 4.3  08/31/2013 1050   CO2 24 08/31/2013 1050   GLUCOSE 165* 08/31/2013 1050   BUN 13.1 08/31/2013 1050   CREATININE 0.8 08/31/2013 1050   CALCIUM 9.6 08/31/2013 1050   PROT 6.9 08/31/2013 1050   ALBUMIN 4.0 08/31/2013 1050   AST 19 08/31/2013 1050   ALT 37 08/31/2013 1050   ALKPHOS 89 08/31/2013 1050   BILITOT 0.44 08/31/2013 1050    I No results found for this basename: SPEP,  UPEP,   kappa and lambda light chains    Lab Results  Component Value Date   WBC 23.3* 09/28/2013   NEUTROABS 19.5* 09/28/2013   HGB 10.3* 09/28/2013   HCT 31.3* 09/28/2013   MCV 83.7 09/28/2013   PLT 196 09/28/2013      Chemistry      Component Value Date/Time   NA 143 08/31/2013 1050   K 4.3 08/31/2013 1050   CO2 24 08/31/2013 1050   BUN 13.1 08/31/2013 1050   CREATININE 0.8 08/31/2013 1050      Component Value Date/Time   CALCIUM 9.6 08/31/2013 1050   ALKPHOS 89 08/31/2013 1050   AST 19 08/31/2013 1050   ALT 37 08/31/2013 1050   BILITOT 0.44 08/31/2013 1050       No results found for this basename: LABCA2    No components found with this basename: LABCA125    No results found for this basename: INR,  in the last 168 hours  Urinalysis    Component Value Date/Time   BILIRUBINUR Negative 09/22/2011 1451   PROTEINUR Trace 09/22/2011 1451   UROBILINOGEN negative 09/22/2011 1451   NITRITE Negative 09/22/2011 1451   LEUKOCYTESUR Trace 09/22/2011 1451    STUDIES: No results found.  ASSESSMENT: 48 y.o. [place] woman status post left breast and left axillary lymph node biopsy 06/30/2013 for a clinical T2 N1-2, stage IIB/IIIA invasive ductal carcinoma, grade 2, estrogen and progesterone receptors both strongly positive, HER-2 nonamplified, with  an MIB-1 of 36%.  (1) there is a 5 mm left internal mammary lymph node which was negative on PET scan  (2) cyclophosphamide and doxorubicin started 08/04/2014, given in dose dense fashion x4 with Neulasta day 2; completed 09/14/2013, to be followed by paclitaxel again given in dose dense  fashion x4 with Neulasta support  PLAN: Keyara is ready to start her treatments with paclitaxel every 2 weeks. She is aware of the possible toxicities, side effects and complications of this agent. In particular I have warned her regarding the development of peripheral neuropathy, which might be a reversible. She is also aware of the risk of immune reactions with the first dose.  She is going to have an MRI of the brain within the next week and also an MRI of the breasts to document response so far. She will call for those results.  She will see [place] in a week just to make sure she tolerated her first Taxol cycle well and then she will see me in 2 weeks on the morning of her second cycle. Debbie [name] has a good understanding of the overall plan. She agrees with it. She knows to call for any problems that may develop before that visit.   Chauncey Cruel, MD   09/28/2013 11:08 AM

## 2013-09-28 NOTE — Patient Instructions (Signed)
[facility] Discharge Instructions for Patients Receiving Chemotherapy  Today you received the following chemotherapy agents Taxol.   To help prevent nausea and vomiting after your treatment, we encourage you to take your nausea medication  (Dr. [name] RN calling in anti-emetics to pharmacy; please stop and pick-up on way home) If you develop nausea and vomiting that is not controlled by your nausea medication, call the clinic.   BELOW ARE SYMPTOMS THAT SHOULD BE REPORTED IMMEDIATELY:  *FEVER GREATER THAN 100.5 F  *CHILLS WITH OR WITHOUT FEVER  NAUSEA AND VOMITING THAT IS NOT CONTROLLED WITH YOUR NAUSEA MEDICATION  *UNUSUAL SHORTNESS OF BREATH  *UNUSUAL BRUISING OR BLEEDING  TENDERNESS IN MOUTH AND THROAT WITH OR WITHOUT PRESENCE OF ULCERS  *URINARY PROBLEMS  *BOWEL PROBLEMS  UNUSUAL RASH Items with * indicate a potential emergency and should be followed up as soon as possible.  Feel free to call the clinic you have any questions or concerns. The clinic phone number is [phone].   Paclitaxel injection What is this medicine? PACLITAXEL (PAK li TAX el) is a chemotherapy drug. It targets fast dividing cells, like cancer cells, and causes these cells to die. This medicine is used to treat ovarian cancer, breast cancer, and other cancers. This medicine may be used for other purposes; ask your health care provider or pharmacist if you have questions. COMMON BRAND NAME(S): Onxol , Taxol What should I tell my health care provider before I take this medicine? They need to know if you have any of these conditions: -blood disorders -irregular heartbeat -infection (especially a virus infection such as chickenpox, cold sores, or herpes) -liver disease -previous or ongoing radiation therapy -an unusual or allergic reaction to paclitaxel, alcohol, polyoxyethylated castor oil, other chemotherapy agents, other medicines, foods, dyes, or preservatives -pregnant or  trying to get pregnant -breast-feeding How should I use this medicine? This drug is given as an infusion into a vein. It is administered in a hospital or clinic by a specially trained health care professional. Talk to your pediatrician regarding the use of this medicine in children. Special care may be needed. Overdosage: If you think you have taken too much of this medicine contact a poison control center or emergency room at once. NOTE: This medicine is only for you. Do not share this medicine with others. What if I miss a dose? It is important not to miss your dose. Call your doctor or health care professional if you are unable to keep an appointment. What may interact with this medicine? Do not take this medicine with any of the following medications: -disulfiram -metronidazole This medicine may also interact with the following medications: -cyclosporine -diazepam -ketoconazole -medicines to increase blood counts like filgrastim, pegfilgrastim, sargramostim -other chemotherapy drugs like cisplatin, doxorubicin, epirubicin, etoposide, teniposide, vincristine -quinidine -testosterone -vaccines -verapamil Talk to your doctor or health care professional before taking any of these medicines: -acetaminophen -aspirin -ibuprofen -ketoprofen -naproxen This list may not describe all possible interactions. Give your health care provider a list of all the medicines, herbs, non-prescription drugs, or dietary supplements you use. Also tell them if you smoke, drink alcohol, or use illegal drugs. Some items may interact with your medicine. What should I watch for while using this medicine? Your condition will be monitored carefully while you are receiving this medicine. You will need important blood work done while you are taking this medicine. This drug may make you feel generally unwell. This is not uncommon, as chemotherapy can affect healthy cells  as well as cancer cells. Report any side  effects. Continue your course of treatment even though you feel ill unless your doctor tells you to stop. In some cases, you may be given additional medicines to help with side effects. Follow all directions for their use. Call your doctor or health care professional for advice if you get a fever, chills or sore throat, or other symptoms of a cold or flu. Do not treat yourself. This drug decreases your body's ability to fight infections. Try to avoid being around people who are sick. This medicine may increase your risk to bruise or bleed. Call your doctor or health care professional if you notice any unusual bleeding. Be careful brushing and flossing your teeth or using a toothpick because you may get an infection or bleed more easily. If you have any dental work done, tell your dentist you are receiving this medicine. Avoid taking products that contain aspirin, acetaminophen, ibuprofen, naproxen, or ketoprofen unless instructed by your doctor. These medicines may hide a fever. Do not become pregnant while taking this medicine. Women should inform their doctor if they wish to become pregnant or think they might be pregnant. There is a potential for serious side effects to an unborn child. Talk to your health care professional or pharmacist for more information. Do not breast-feed an infant while taking this medicine. Men are advised not to father a child while receiving this medicine. What side effects may I notice from receiving this medicine? Side effects that you should report to your doctor or health care professional as soon as possible: -allergic reactions like skin rash, itching or hives, swelling of the face, lips, or tongue -low blood counts - This drug may decrease the number of white blood cells, red blood cells and platelets. You may be at increased risk for infections and bleeding. -signs of infection - fever or chills, cough, sore throat, pain or difficulty passing urine -signs of  decreased platelets or bleeding - bruising, pinpoint red spots on the skin, black, tarry stools, nosebleeds -signs of decreased red blood cells - unusually weak or tired, fainting spells, lightheadedness -breathing problems -chest pain -high or low blood pressure -mouth sores -nausea and vomiting -pain, swelling, redness or irritation at the injection site -pain, tingling, numbness in the hands or feet -slow or irregular heartbeat -swelling of the ankle, feet, hands Side effects that usually do not require medical attention (report to your doctor or health care professional if they continue or are bothersome): -bone pain -complete hair loss including hair on your head, underarms, pubic hair, eyebrows, and eyelashes -changes in the color of fingernails -diarrhea -loosening of the fingernails -loss of appetite -muscle or joint pain -red flush to skin -sweating This list may not describe all possible side effects. Call your doctor for medical advice about side effects. You may report side effects to FDA at 1-800-FDA-1088. Where should I keep my medicine? This drug is given in a hospital or clinic and will not be stored at home. NOTE: This sheet is a summary. It may not cover all possible information. If you have questions about this medicine, talk to your doctor, pharmacist, or health care provider.  2014, Elsevier/Gold Standard. (2012-07-11 16:41:21)

## 2013-09-28 NOTE — [facility] Oncology Navigator Note (Signed)
Patient at [facility] for a f/u visit with Dr. Jana Hakim and chemotherapy treatment.  She is accompanied by a friend.  Patient reports that she has done well since her last treatment.  She denies any complaints at this time.  She is anxious for her hair to grow back.  She denies any questions or concerns.  I encouraged her to call me for any needs.

## 2013-09-29 ENCOUNTER — Telehealth: Payer: Self-pay | Admitting: Oncology

## 2013-09-29 ENCOUNTER — Telehealth: Payer: Self-pay | Admitting: *Deleted

## 2013-09-29 ENCOUNTER — Ambulatory Visit (HOSPITAL_BASED_OUTPATIENT_CLINIC_OR_DEPARTMENT_OTHER): Payer: BC Managed Care – PPO

## 2013-09-29 VITALS — BP 137/46 | HR 91 | Temp 98.8°F

## 2013-09-29 DIAGNOSIS — C50312 Malignant neoplasm of lower-inner quadrant of left female breast: Secondary | ICD-10-CM

## 2013-09-29 DIAGNOSIS — Z5189 Encounter for other specified aftercare: Secondary | ICD-10-CM

## 2013-09-29 DIAGNOSIS — C50319 Malignant neoplasm of lower-inner quadrant of unspecified female breast: Secondary | ICD-10-CM

## 2013-09-29 MED ORDER — PEGFILGRASTIM INJECTION 6 MG/0.6ML
6.0000 mg | Freq: Once | SUBCUTANEOUS | Status: AC
  Administered 2013-09-29: 6 mg via SUBCUTANEOUS
  Filled 2013-09-29: qty 0.6

## 2013-09-29 NOTE — Telephone Encounter (Signed)
PT. IS EATING AND FORCING FLUIDS. NO ISSUES WITH NAUSEA, VOMITING, DIARRHEA, CONSTIPATION, OR MOUTH SORES. INFORMED PT. CONCERNING THE ON CALL PHYSICIAN. SHE VOICES UNDERSTANDING.

## 2013-09-29 NOTE — Telephone Encounter (Signed)
, °

## 2013-10-02 ENCOUNTER — Ambulatory Visit (HOSPITAL_COMMUNITY)
Admission: RE | Admit: 2013-10-02 | Discharge: 2013-10-02 | Disposition: A | Discharge status: Home / Self Care | Payer: BC Managed Care – PPO | Source: Ambulatory Visit | Attending: Oncology | Admitting: Oncology

## 2013-10-02 ENCOUNTER — Telehealth: Payer: Self-pay | Admitting: *Deleted

## 2013-10-02 DIAGNOSIS — C50312 Malignant neoplasm of lower-inner quadrant of left female breast: Secondary | ICD-10-CM

## 2013-10-02 DIAGNOSIS — C50919 Malignant neoplasm of unspecified site of unspecified female breast: Secondary | ICD-10-CM | POA: Insufficient documentation

## 2013-10-02 DIAGNOSIS — R51 Headache: Secondary | ICD-10-CM | POA: Diagnosis not present

## 2013-10-02 MED ORDER — GADOBENATE DIMEGLUMINE 529 MG/ML IV SOLN
20.0000 mL | Freq: Once | INTRAVENOUS | Status: AC | PRN
  Administered 2013-10-02: 20 mL via INTRAVENOUS

## 2013-10-02 NOTE — Telephone Encounter (Signed)
I have adjusted 5/14

## 2013-10-03 ENCOUNTER — Inpatient Hospital Stay: Admission: RE | Admit: 2013-10-03 | Payer: BC Managed Care – PPO | Source: Ambulatory Visit

## 2013-10-06 ENCOUNTER — Ambulatory Visit (HOSPITAL_BASED_OUTPATIENT_CLINIC_OR_DEPARTMENT_OTHER): Payer: BC Managed Care – PPO | Admitting: Hematology and Oncology

## 2013-10-06 ENCOUNTER — Other Ambulatory Visit (HOSPITAL_BASED_OUTPATIENT_CLINIC_OR_DEPARTMENT_OTHER): Payer: BC Managed Care – PPO

## 2013-10-06 VITALS — BP 125/70 | HR 103 | Temp 98.4°F | Resp 20 | Ht 64.5 in | Wt 201.1 lb

## 2013-10-06 DIAGNOSIS — C50312 Malignant neoplasm of lower-inner quadrant of left female breast: Secondary | ICD-10-CM

## 2013-10-06 DIAGNOSIS — E7800 Pure hypercholesterolemia, unspecified: Secondary | ICD-10-CM

## 2013-10-06 DIAGNOSIS — C50319 Malignant neoplasm of lower-inner quadrant of unspecified female breast: Secondary | ICD-10-CM

## 2013-10-06 DIAGNOSIS — Z170 Estrogen receptor positive status [ER+]: Secondary | ICD-10-CM

## 2013-10-06 LAB — CBC WITH DIFFERENTIAL/PLATELET
BASO%: 1.6 % (ref 0.0–2.0)
Basophils Absolute: 0.7 10*3/uL — ABNORMAL HIGH (ref 0.0–0.1)
EOS%: 0.6 % (ref 0.0–7.0)
Eosinophils Absolute: 0.2 10*3/uL (ref 0.0–0.5)
HCT: 28.8 % — ABNORMAL LOW (ref 34.8–46.6)
HGB: 9.5 g/dL — ABNORMAL LOW (ref 11.6–15.9)
LYMPH%: 7 % — ABNORMAL LOW (ref 14.0–49.7)
MCH: 27.8 pg (ref 25.1–34.0)
MCHC: 33 g/dL (ref 31.5–36.0)
MCV: 84.2 fL (ref 79.5–101.0)
MONO#: 2.9 10*3/uL — ABNORMAL HIGH (ref 0.1–0.9)
MONO%: 7.1 % (ref 0.0–14.0)
NEUT#: 34 10*3/uL — ABNORMAL HIGH (ref 1.5–6.5)
NEUT%: 83.7 % — ABNORMAL HIGH (ref 38.4–76.8)
Platelets: 211 10*3/uL (ref 145–400)
RBC: 3.42 10*6/uL — ABNORMAL LOW (ref 3.70–5.45)
RDW: 17.6 % — ABNORMAL HIGH (ref 11.2–14.5)
WBC: 40.6 10*3/uL — ABNORMAL HIGH (ref 3.9–10.3)
lymph#: 2.8 10*3/uL (ref 0.9–3.3)

## 2013-10-06 MED ORDER — OXYCODONE-ACETAMINOPHEN 5-325 MG PO TABS: 1.0000 | ORAL_TABLET | Freq: Three times a day (TID) | ORAL | Status: DC | PRN

## 2013-10-06 MED ORDER — HYDROCODONE-ACETAMINOPHEN 5-325 MG PO TABS: 1.0000 | ORAL_TABLET | Freq: Four times a day (QID) | ORAL | Status: DC | PRN

## 2013-10-06 NOTE — Progress Notes (Signed)
[place]  [phone] [phone]) [phone]     ID: [name] OB: March 10, 1966  MR#: [id number]  ZHG#:[id number]  PCP: [name], MD GYN:  [name]: [name] OTHER MD: Cline Cools  CHIEF COMPLAINT: Follow up visit after chemotherapy  HISTORY OF PRESENT ILLNESS: As per previously documented note:  Tarrah had routine yearly screening mammography at the breast center 06/19/2013 showing a possible mass in the left breast. The right breast was unremarkable. On 06/30/2013 additional views of the left breast showed an irregular spiculated dense mass measuring 2.2 cm in the lower inner quadrant. This was palpable at the 8:00 location. Ultrasound showed an irregular hypoechoic mass measuring 2.3 cm and in the left axilla a dominant abnormal appearing lymph node with cortical thickening measuring 1.2 cm.  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  [id number] the patient underwent bilateral breast MRI. This showed her breasts to be dense (composition C.). In the left breast at the 8:00 position there was a 2.4 cm enhancing mass containing a biopsy clip. There were no additional areas of concern. In the axilla on the left there was a 2.6 cm enlarged left axillary lymph node. There was also a 0.5 cm left internal mammary lymph nodes noted. The right side was unremarkable.  The patient's subsequent history is as detailed below.  INTERVAL HISTORY: Abisai returns today for followup visit for her breast cancer. She completed cycle 1 of 4 planned dose  dense cycles of paclitaxel on 09/28/2013. She is tolerating paclitaxel much better than dose dense AC. The only complaint she has is leg pains. She also complains of constipation intermittently and also alopecia, taste changes. She  keeps well hydrated by drinking one and 1/2-2 L of water per day. She was wondering if she could get the massage therapy for the vouchers she got from the [facility]. Fatigue is much improved. Her MRI of the brain which was performed on 10/02/2013 revealed no evidence of any metastases. She says her headaches are also much improved. denies any nausea, vomiting, blood in the stool, blood in the urine, dizziness, blood vision, test pain, fever, weight loss or decrease in her appetite   REVIEW OF SYSTEMS: A 10 point review of systems were assessed and pertinent symptoms as mentioned in interval history  PAST MEDICAL HISTORY: Past Medical History  Diagnosis Date  . MVP (mitral valve prolapse)   . IBS (irritable bowel syndrome)   . Migraines     menstral  . Hyperlipidemia   . MVP (mitral valve prolapse)   . Migraines   . IBS (irritable bowel syndrome)   . Abnormal Pap smear   . Seasonal allergies   . Anxiety   . Menorrhagia   . Breast cancer 2015    ER+/PR+/Her2-    PAST SURGICAL HISTORY: Past Surgical History  Procedure Laterality Date  . Wisdom teeth removal    . Vulva /perineum biopsy  2011  . Svd       x 3  . Endometrial ablation    . Portacath placement Left 07/18/2013    Procedure:  INSERTION PORT-A-CATH;  Surgeon: [name], MD;  Location: [facility];  Service: General;  Laterality: Left;    FAMILY HISTORY Family History  Problem Relation Age of Onset  . Cancer Maternal Grandmother     BREAST AND UTERINE CANCER  . Uterine cancer Maternal Grandmother 84  . Hyperlipidemia Mother   . Hypertension Mother   . Multiple myeloma Father 70  . Lung cancer Maternal Development worker, international aid  . Testicular cancer Other 39  The patient's father is living,  currently age 87. He has a history of multiple myeloma. The patient's mother is 66. The patient's parents live in [place] The patient had no brothers, 2 sisters. There is no history of breast or ovarian cancer in the family. She believes one of her grandfathers died from lung cancer and another from pancreatic cancer, but is not sure  GYNECOLOGIC HISTORY:  Menarche age 89, first live birth age 76, which the patient understands is an independent risk factor for breast cancer. She is GX P3. She still having regular periods. 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 Her husband [name] is a Energy manager at [employer]. Her 3 children are [name] (14), [name] (10) and [name] (7). The patient attends a local [employer]: Not in place   HEALTH MAINTENANCE: History  Substance Use Topics  . Smoking status: Never Smoker   . Smokeless tobacco: Never Used  . Alcohol Use: No     Colonoscopy: January 2015  WLS:[id number] 2014  Bone density:  Lipid panel:  Allergies  Allergen Reactions  . Dust Mite Extract Itching    Current Outpatient Prescriptions  Medication Sig Dispense Refill  . ALPRAZolam (XANAX) 0.5 MG tablet Take 1 tablet (0.5 mg total) by mouth 2 (two) times daily as needed for anxiety.  10 tablet  0  . atorvastatin (LIPITOR) 20 MG tablet Take 20 mg by mouth daily.      . cetirizine (ZYRTEC) 10 MG tablet Take 10 mg by mouth daily.      . FROVA 2.5 MG tablet Take 2.5 mg by mouth daily as needed for migraine.       . gabapentin (NEURONTIN) 300 MG capsule Take 1 capsule (300 mg total) by mouth at bedtime.  30 capsule  6  . lidocaine-prilocaine (EMLA) cream Apply 1 application topically as needed. Apply over port site 1-2 hours before chemotherapy and cover with plastic wrap  30 g  0  . LORazepam (ATIVAN) 0.5 MG tablet Take 1 tablet (0.5 mg total) by mouth  at bedtime as needed (Nausea or vomiting).  30 tablet  0  . mometasone (NASONEX) 50 MCG/ACT nasal spray Place 2 sprays into the nose daily as needed (for allergies).       Marland Kitchen omeprazole (PRILOSEC) 20 MG capsule Take 1 capsule (20 mg total) by mouth 2 (two) times daily before a meal.  60 capsule  3  . oxymetazoline (AFRIN) 0.05 % nasal spray Place 2 sprays into the nose daily as needed. congestion      . PARoxetine (PAXIL) 20 MG tablet Take 20 mg by mouth every morning.      . prochlorperazine (COMPAZINE) 10 MG tablet Take 1 tablet (10 mg total) by mouth every 6 (six) hours as needed for nausea or vomiting.  30 tablet  0  . RELPAX 40 MG tablet Take  40 mg by mouth every 2 (two) hours as needed for migraine.       . Vitamin D, Ergocalciferol, (DRISDOL) 50000 UNITS CAPS capsule Take 50,000 Units by mouth every 7 (seven) days.       Marland Kitchen oxyCODONE-acetaminophen (PERCOCET/ROXICET) 5-325 MG per tablet Take 1 tablet by mouth every 8 (eight) hours as needed for moderate pain or severe pain.  30 tablet  0  . rizatriptan (MAXALT-MLT) 10 MG disintegrating tablet        No current facility-administered medications for this visit.    OBJECTIVE: Middle-aged white woman  who appears stated age  48 Vitals:   10/06/13 1431  BP: 125/70  Pulse: 103  Temp: 98.4 F (36.9 C)  Resp: 20     Body mass index is 34 kg/(m^2).    ECOG FS:0 - Asymptomatic  Sclerae unicteric, pupils  round and reactive  Oropharynx clear and moist; I do not note any lesions although she feels a couple of lesions on her tongue.  No cervical or supraclavicular adenopathy Lungs no rales or rhonchi Heart regular rate and rhythm Abd soft, obese, nontender, positive bowel sounds MSK no focal spinal tenderness, no upper extremity lymphedema Neuro: nonfocal, well oriented, positive affect Breasts: I did not feel any masses in either breast .no bilateral axillary lymphadenopathy was noted   LAB RESULTS:  CMP     Component Value Date/Time    NA 143 08/31/2013 1050   K 4.3 08/31/2013 1050   CO2 24 08/31/2013 1050   GLUCOSE 165* 08/31/2013 1050   BUN 13.1 08/31/2013 1050   CREATININE 0.8 08/31/2013 1050   CALCIUM 9.6 08/31/2013 1050   PROT 6.9 08/31/2013 1050   ALBUMIN 4.0 08/31/2013 1050   AST 19 08/31/2013 1050   ALT 37 08/31/2013 1050   ALKPHOS 89 08/31/2013 1050   BILITOT 0.44 08/31/2013 1050    I No results found for this basename: SPEP,  UPEP,   kappa and lambda light chains    Lab Results  Component Value Date   WBC 40.6* 10/06/2013   NEUTROABS 34.0* 10/06/2013   HGB 9.5* 10/06/2013   HCT 28.8* 10/06/2013   MCV 84.2 10/06/2013   PLT 211 10/06/2013      Chemistry      Component Value Date/Time   NA 143 08/31/2013 1050   K 4.3 08/31/2013 1050   CO2 24 08/31/2013 1050   BUN 13.1 08/31/2013 1050   CREATININE 0.8 08/31/2013 1050      Component Value Date/Time   CALCIUM 9.6 08/31/2013 1050   ALKPHOS 89 08/31/2013 1050   AST 19 08/31/2013 1050   ALT 37 08/31/2013 1050   BILITOT 0.44 08/31/2013 1050       No results found for this basename: LABCA2    No components found with this basename: LABCA125    No results found for this basename: INR,  in the last 168 hours  Urinalysis    Component Value Date/Time   BILIRUBINUR Negative 09/22/2011 1451   PROTEINUR Trace 09/22/2011 1451   UROBILINOGEN negative 09/22/2011 1451   NITRITE Negative 09/22/2011 1451   LEUKOCYTESUR Trace 09/22/2011 1451    STUDIES: No results found.  ASSESSMENT: 48 y.o. [place] woman status post left breast and left axillary lymph node biopsy 06/30/2013 for a clinical T2 N1-2, stage IIB/IIIA invasive ductal carcinoma, grade 2, estrogen and progesterone receptors both strongly positive, HER-2 nonamplified, with an MIB-1 of 36%.  (1) there is a 5 mm left internal mammary lymph node which  was negative on PET scan  (2) cyclophosphamide and doxorubicin started 08/04/2014, given in dose dense fashion x4 with Neulasta day 2; completed 09/14/2013, to be followed by paclitaxel again  given in dose dense fashion x4 with Neulasta support  PLAN: Anberlin  tolerated the cycle 1 of dose dense paclitaxel therapy very well . Her white cell count improved to 40.6 after Neulasta therapy. I have asked her to take Claritin daily(at least 5 days) with Neulasta therapy for her muscle aches.  I have reviewed MRI of the brain result with her and it was negative for any brain metastasis. She is very much excited about the news.   She is also scheduled for MRI of the breasts on may 10/09/2013 to document response to chemotherapy  Next followup visit on April 14 for initiation of cycle 2 chemotherapy with dose dense paclitaxel. CBC and differential and CMP will be performed on the day of next visit  [name] is in agreement with the current plan of care . She knows to call for any problems that may develop before that visit.   Wilmon Arms, MD  Medical oncology   10/06/2013 8:22 PM

## 2013-10-09 ENCOUNTER — Ambulatory Visit
Admission: RE | Admit: 2013-10-09 | Discharge: 2013-10-09 | Disposition: A | Discharge status: Home / Self Care | Payer: BC Managed Care – PPO | Source: Ambulatory Visit | Attending: Oncology | Admitting: Oncology

## 2013-10-09 DIAGNOSIS — C50312 Malignant neoplasm of lower-inner quadrant of left female breast: Secondary | ICD-10-CM

## 2013-10-09 MED ORDER — GADOBENATE DIMEGLUMINE 529 MG/ML IV SOLN
19.0000 mL | Freq: Once | INTRAVENOUS | Status: AC | PRN
  Administered 2013-10-09: 19 mL via INTRAVENOUS

## 2013-10-12 ENCOUNTER — Ambulatory Visit (HOSPITAL_BASED_OUTPATIENT_CLINIC_OR_DEPARTMENT_OTHER): Payer: BC Managed Care – PPO

## 2013-10-12 ENCOUNTER — Telehealth: Payer: Self-pay | Admitting: Adult Health

## 2013-10-12 ENCOUNTER — Ambulatory Visit (HOSPITAL_BASED_OUTPATIENT_CLINIC_OR_DEPARTMENT_OTHER): Payer: BC Managed Care – PPO | Admitting: Oncology

## 2013-10-12 ENCOUNTER — Other Ambulatory Visit (HOSPITAL_BASED_OUTPATIENT_CLINIC_OR_DEPARTMENT_OTHER): Payer: BC Managed Care – PPO

## 2013-10-12 ENCOUNTER — Other Ambulatory Visit: Payer: BC Managed Care – PPO

## 2013-10-12 VITALS — BP 104/63 | HR 98 | Temp 98.4°F | Resp 18 | Ht 64.5 in | Wt 201.7 lb

## 2013-10-12 DIAGNOSIS — C50312 Malignant neoplasm of lower-inner quadrant of left female breast: Secondary | ICD-10-CM

## 2013-10-12 DIAGNOSIS — I341 Nonrheumatic mitral (valve) prolapse: Secondary | ICD-10-CM

## 2013-10-12 DIAGNOSIS — C50319 Malignant neoplasm of lower-inner quadrant of unspecified female breast: Secondary | ICD-10-CM

## 2013-10-12 DIAGNOSIS — Z170 Estrogen receptor positive status [ER+]: Secondary | ICD-10-CM

## 2013-10-12 DIAGNOSIS — Z5111 Encounter for antineoplastic chemotherapy: Secondary | ICD-10-CM

## 2013-10-12 DIAGNOSIS — E7800 Pure hypercholesterolemia, unspecified: Secondary | ICD-10-CM

## 2013-10-12 DIAGNOSIS — R7989 Other specified abnormal findings of blood chemistry: Secondary | ICD-10-CM

## 2013-10-12 DIAGNOSIS — K589 Irritable bowel syndrome without diarrhea: Secondary | ICD-10-CM

## 2013-10-12 LAB — CBC WITH DIFFERENTIAL/PLATELET
BASO%: 1.2 % (ref 0.0–2.0)
Basophils Absolute: 0.3 10*3/uL — ABNORMAL HIGH (ref 0.0–0.1)
EOS%: 2.5 % (ref 0.0–7.0)
Eosinophils Absolute: 0.6 10*3/uL — ABNORMAL HIGH (ref 0.0–0.5)
HCT: 29 % — ABNORMAL LOW (ref 34.8–46.6)
HGB: 9.4 g/dL — ABNORMAL LOW (ref 11.6–15.9)
LYMPH%: 5.2 % — ABNORMAL LOW (ref 14.0–49.7)
MCH: 27.6 pg (ref 25.1–34.0)
MCHC: 32.4 g/dL (ref 31.5–36.0)
MCV: 85.1 fL (ref 79.5–101.0)
MONO#: 2.3 10*3/uL — ABNORMAL HIGH (ref 0.1–0.9)
MONO%: 9.5 % (ref 0.0–14.0)
NEUT#: 19.4 10*3/uL — ABNORMAL HIGH (ref 1.5–6.5)
NEUT%: 81.6 % — ABNORMAL HIGH (ref 38.4–76.8)
Platelets: 187 10*3/uL (ref 145–400)
RBC: 3.41 10*6/uL — ABNORMAL LOW (ref 3.70–5.45)
RDW: 18.9 % — ABNORMAL HIGH (ref 11.2–14.5)
WBC: 23.7 10*3/uL — ABNORMAL HIGH (ref 3.9–10.3)
lymph#: 1.2 10*3/uL (ref 0.9–3.3)

## 2013-10-12 LAB — COMPREHENSIVE METABOLIC PANEL (CC13)
ALT: 61 U/L — ABNORMAL HIGH (ref 0–55)
AST: 42 U/L — ABNORMAL HIGH (ref 5–34)
Albumin: 3.9 g/dL (ref 3.5–5.0)
Alkaline Phosphatase: 118 U/L (ref 40–150)
Anion Gap: 15 mEq/L — ABNORMAL HIGH (ref 3–11)
BUN: 7.5 mg/dL (ref 7.0–26.0)
CO2: 22 mEq/L (ref 22–29)
Calcium: 9.8 mg/dL (ref 8.4–10.4)
Chloride: 105 mEq/L (ref 98–109)
Creatinine: 0.7 mg/dL (ref 0.6–1.1)
Glucose: 133 mg/dl (ref 70–140)
Potassium: 4.1 mEq/L (ref 3.5–5.1)
Sodium: 142 mEq/L (ref 136–145)
Total Bilirubin: 0.55 mg/dL (ref 0.20–1.20)
Total Protein: 6.7 g/dL (ref 6.4–8.3)

## 2013-10-12 LAB — TECHNOLOGIST REVIEW

## 2013-10-12 MED ORDER — DIPHENHYDRAMINE HCL 50 MG/ML IJ SOLN
INTRAMUSCULAR | Status: AC
  Filled 2013-10-12: qty 1

## 2013-10-12 MED ORDER — DEXAMETHASONE SODIUM PHOSPHATE 20 MG/5ML IJ SOLN
INTRAMUSCULAR | Status: AC
  Filled 2013-10-12: qty 5

## 2013-10-12 MED ORDER — HEPARIN SOD (PORK) LOCK FLUSH 100 UNIT/ML IV SOLN
500.0000 [IU] | Freq: Once | INTRAVENOUS | Status: AC | PRN
  Administered 2013-10-12: 500 [IU]
  Filled 2013-10-12: qty 5

## 2013-10-12 MED ORDER — SODIUM CHLORIDE 0.9 % IJ SOLN
10.0000 mL | INTRAMUSCULAR | Status: DC | PRN
  Administered 2013-10-12: 10 mL
  Filled 2013-10-12: qty 10

## 2013-10-12 MED ORDER — DIPHENHYDRAMINE HCL 50 MG/ML IJ SOLN
25.0000 mg | Freq: Once | INTRAMUSCULAR | Status: AC
  Administered 2013-10-12: 25 mg via INTRAVENOUS

## 2013-10-12 MED ORDER — FAMOTIDINE IN NACL 20-0.9 MG/50ML-% IV SOLN
INTRAVENOUS | Status: AC
  Filled 2013-10-12: qty 50

## 2013-10-12 MED ORDER — FAMOTIDINE IN NACL 20-0.9 MG/50ML-% IV SOLN
20.0000 mg | Freq: Once | INTRAVENOUS | Status: AC
  Administered 2013-10-12: 20 mg via INTRAVENOUS

## 2013-10-12 MED ORDER — PACLITAXEL CHEMO INJECTION 300 MG/50ML
175.0000 mg/m2 | Freq: Once | INTRAVENOUS | Status: AC
  Administered 2013-10-12: 360 mg via INTRAVENOUS
  Filled 2013-10-12: qty 60

## 2013-10-12 MED ORDER — ONDANSETRON 8 MG/NS 50 ML IVPB
INTRAVENOUS | Status: AC
  Filled 2013-10-12: qty 8

## 2013-10-12 MED ORDER — SODIUM CHLORIDE 0.9 % IV SOLN
Freq: Once | INTRAVENOUS | Status: AC
  Administered 2013-10-12: 11:00:00 via INTRAVENOUS

## 2013-10-12 MED ORDER — DEXAMETHASONE SODIUM PHOSPHATE 20 MG/5ML IJ SOLN
20.0000 mg | Freq: Once | INTRAMUSCULAR | Status: AC
  Administered 2013-10-12: 20 mg via INTRAVENOUS

## 2013-10-12 MED ORDER — TRAMADOL HCL 50 MG PO TABS: 50.0000 mg | ORAL_TABLET | Freq: Four times a day (QID) | ORAL | Status: DC | PRN

## 2013-10-12 MED ORDER — ONDANSETRON 8 MG/50ML IVPB (CHCC)
8.0000 mg | Freq: Once | INTRAVENOUS | Status: AC
  Administered 2013-10-12: 8 mg via INTRAVENOUS

## 2013-10-12 NOTE — Progress Notes (Signed)
AST/ALT levels elevated today; treat patient despite, per Dr. Jana Hakim

## 2013-10-12 NOTE — Progress Notes (Signed)
[place]  [phone] [phone]     ID: Debbie [name] OB: 1965/10/23  MR#: [id number]  HXT#:[id number]  PCP: Debbie Schwalbe, MD GYN:  Debbie [name] SU: Debbie [name] OTHER MD: Debbie [name]  CHIEF COMPLAINT: Follow up visit after chemotherapy  BREAST CANCER HISTORY: As per previously documented note:  Debbie [name] had routine yearly screening mammography at the breast center 06/19/2013 showing a possible mass in the left breast. The right breast was unremarkable. On 06/30/2013 additional views of the left breast showed an irregular spiculated dense mass measuring 2.2 cm in the lower inner quadrant. This was palpable at the 8:00 location. Ultrasound showed an irregular hypoechoic mass measuring 2.3 cm and in the left axilla a dominant abnormal appearing lymph node with cortical thickening measuring 1.2 cm.  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  [id number] the patient underwent bilateral breast MRI. This showed her breasts to be dense (composition C.). In the left breast at the 8:00 position there was a 2.4 cm enhancing mass containing a biopsy clip. There were no additional areas of concern. In the axilla on the left there was a 2.6 cm enlarged left axillary lymph node. There was also a 0.5 cm left internal mammary lymph nodes noted. The right side was unremarkable.  The patient's subsequent history is as detailed below.  INTERVAL HISTORY: Debbie [name] returns today for followup of her breast cancer. Today is day 1 cycle 2 of every 2 week paclitaxel, with  4 cycles planned. She receives Neulasta on day 2   REVIEW OF SYSTEMS: She had significant pain in her legs, for up to 4 days after treatment. The pain was actually in an ankle than in needed and a half and so on, he did not stay in one place, but it did last several days. Ibuprofen may be tolerable but did not really help all of much. She also had problems with constipation. She takes magnesium for the she has had no peripheral neuropathy, no significant nail changes, no mouth sores, no nausea or vomiting. A detailed review of systems today was otherwise noncontributory  PAST MEDICAL HISTORY: Past Medical History  Diagnosis Date  . MVP (mitral valve prolapse)   . IBS (irritable bowel syndrome)   . Migraines     menstral  . Hyperlipidemia   . MVP (mitral valve prolapse)   . Migraines   . IBS (irritable bowel syndrome)   . Abnormal Pap smear   . Seasonal allergies   . Anxiety   . Menorrhagia   . Breast cancer 2015    ER+/PR+/Her2-    PAST SURGICAL HISTORY: Past Surgical History  Procedure Laterality Date  . Wisdom teeth removal    . Vulva /perineum biopsy  2011  . Svd       x 3  . Endometrial ablation    . Portacath placement Left 07/18/2013    Procedure: INSERTION PORT-A-CATH;  Surgeon: Debbie Klein, MD;  Location: [facility] OR;  Service: General;  Laterality: Left;    FAMILY HISTORY Family History  Problem Relation Age of Onset  . Cancer Maternal Grandmother  BREAST AND UTERINE CANCER  . Uterine cancer Maternal Grandmother 84  . Hyperlipidemia Mother   . Hypertension Mother   . Multiple myeloma Father 9  . Lung cancer Maternal Development worker, international aid  . Testicular cancer Other 34  The patient's father is living, currently age 30. He has a history of multiple myeloma. The patient's mother is 80. The patient's parents live in [place] The patient had no brothers, 2 sisters. There is no history of breast or ovarian cancer in the family. She believes one of her  grandfathers died from lung cancer and another from pancreatic cancer, but is not sure  GYNECOLOGIC HISTORY:  Menarche age 49, first live birth age 34, which the patient understands is an independent risk factor for breast cancer. She is GX P3. She still having regular periods. 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 Her husband Debbie [name] is a Energy manager at [employer]. Her 3 children are Debbie [name] (14), Debbie [name] (10) and Debbie [name] (7). The patient attends a local [employer]: Not in place   HEALTH MAINTENANCE: History  Substance Use Topics  . Smoking status: Never Smoker   . Smokeless tobacco: Never Used  . Alcohol Use: No     Colonoscopy: January 2015  OHF:GBMSXJDB 2014  Bone density:  Lipid panel:  Allergies  Allergen Reactions  . Dust Mite Extract Itching    Current Outpatient Prescriptions  Medication Sig Dispense Refill  . ALPRAZolam (XANAX) 0.5 MG tablet Take 1 tablet (0.5 mg total) by mouth 2 (two) times daily as needed for anxiety.  10 tablet  0  . atorvastatin (LIPITOR) 20 MG tablet Take 20 mg by mouth daily.      . cetirizine (ZYRTEC) 10 MG tablet Take 10 mg by mouth daily.      . FROVA 2.5 MG tablet Take 2.5 mg by mouth daily as needed for migraine.       . gabapentin (NEURONTIN) 300 MG capsule Take 1 capsule (300 mg total) by mouth at bedtime.  30 capsule  6  . lidocaine-prilocaine (EMLA) cream Apply 1 application topically as needed. Apply over port site 1-2 hours before chemotherapy and cover with plastic wrap  30 g  0  . LORazepam (ATIVAN) 0.5 MG tablet Take 1 tablet (0.5 mg total) by mouth at bedtime as needed (Nausea or vomiting).  30 tablet  0  . mometasone (NASONEX) 50 MCG/ACT nasal spray Place 2 sprays into the nose daily as needed (for allergies).       Marland Kitchen omeprazole (PRILOSEC) 20 MG capsule Take 1 capsule (20 mg total) by mouth 2 (two)  times daily before a meal.  60 capsule  3  . oxyCODONE-acetaminophen (PERCOCET/ROXICET) 5-325 MG per tablet Take 1 tablet by mouth every 8 (eight) hours as needed for moderate pain or severe pain.  30 tablet  0  . oxymetazoline (AFRIN) 0.05 % nasal spray Place 2 sprays into the nose daily as needed. congestion      . PARoxetine (PAXIL) 20 MG tablet Take 20 mg by mouth every morning.      . prochlorperazine (COMPAZINE) 10 MG tablet Take 1 tablet (10 mg total) by mouth every 6 (six) hours as needed for nausea or vomiting.  30 tablet  0  . RELPAX 40 MG tablet Take 40 mg by mouth every 2 (two) hours as  needed for migraine.       . rizatriptan (MAXALT-MLT) 10 MG disintegrating tablet       . Vitamin D, Ergocalciferol, (DRISDOL) 50000 UNITS CAPS capsule Take 50,000 Units by mouth every 7 (seven) days.        No current facility-administered medications for this visit.    OBJECTIVE: Middle-aged white woman  in no acute distress Filed Vitals:   10/12/13 0932  BP: 104/63  Pulse: 98  Temp: 98.4 F (36.9 C)  Resp: 18     Body mass index is 34.1 kg/(m^2).    ECOG FS:0 - Asymptomatic  Sclerae unicteric, EOMs intact Oropharynx clear and moist No cervical or supraclavicular adenopathy Lungs no rales or rhonchi Heart regular rate and rhythm Abd soft, obese, nontender, positive bowel sounds MSK no focal spinal tenderness, no upper extremity lymphedema Neuro: nonfocal, well oriented, positive affect Breasts: The left breast mass is no longer palpable. The left axilla is benign. The right breast is unremarkable LAB RESULTS:  CMP     Component Value Date/Time   NA 142 10/12/2013 0910   K 4.1 10/12/2013 0910   CO2 22 10/12/2013 0910   GLUCOSE 133 10/12/2013 0910   BUN 7.5 10/12/2013 0910   CREATININE 0.7 10/12/2013 0910   CALCIUM 9.8 10/12/2013 0910   PROT 6.7 10/12/2013 0910   ALBUMIN 3.9 10/12/2013 0910   AST 42* 10/12/2013 0910   ALT 61* 10/12/2013 0910   ALKPHOS 118 10/12/2013 0910   BILITOT 0.55  10/12/2013 0910    I No results found for this basename: SPEP,  UPEP,   kappa and lambda light chains    Lab Results  Component Value Date   WBC 23.7* 10/12/2013   NEUTROABS 19.4* 10/12/2013   HGB 9.4* 10/12/2013   HCT 29.0* 10/12/2013   MCV 85.1 10/12/2013   PLT 187 10/12/2013      Chemistry      Component Value Date/Time   NA 142 10/12/2013 0910   K 4.1 10/12/2013 0910   CO2 22 10/12/2013 0910   BUN 7.5 10/12/2013 0910   CREATININE 0.7 10/12/2013 0910      Component Value Date/Time   CALCIUM 9.8 10/12/2013 0910   ALKPHOS 118 10/12/2013 0910   AST 42* 10/12/2013 0910   ALT 61* 10/12/2013 0910   BILITOT 0.55 10/12/2013 0910       No results found for this basename: LABCA2    No components found with this basename: LABCA125    No results found for this basename: INR,  in the last 168 hours  Urinalysis    Component Value Date/Time   BILIRUBINUR Negative 09/22/2011 1451   PROTEINUR Trace 09/22/2011 1451   UROBILINOGEN negative 09/22/2011 1451   NITRITE Negative 09/22/2011 1451   LEUKOCYTESUR Trace 09/22/2011 1451    STUDIES: Mr Jeri Cos Wo Contrast  2013/10/22   CLINICAL DATA:  48 year old female with persistent headache and known breast cancer. Initial encounter.  EXAM: MRI HEAD WITHOUT AND WITH CONTRAST  TECHNIQUE: Multiplanar, multiecho pulse sequences of the brain and surrounding structures were obtained without and with intravenous contrast.  CONTRAST:  66m MULTIHANCE GADOBENATE DIMEGLUMINE 529 MG/ML IV SOLN  COMPARISON:  None.  FINDINGS: Cerebral volume is normal. No restricted diffusion to suggest acute infarction. No midline shift, mass effect, evidence of mass lesion, ventriculomegaly, extra-axial collection or acute intracranial hemorrhage. Cervicomedullary junction and pituitary are within normal limits. Major intracranial vascular flow voids are preserved. No abnormal enhancement identified. GPearline Cablesand white matter signal is  within normal limits for age throughout the brain.   Visible internal auditory structures appear normal. Mastoids are clear. Paranasal sinuses are clear. Visualized orbit soft tissues are within normal limits. Visualized scalp soft tissues are within normal limits.  Visualized bone marrow signal is mildly diminished throughout. No destructive or suspicious osseous lesion identified.  IMPRESSION: No acute or metastatic intracranial abnormality. Negative MRI appearance of the brain.   Electronically Signed   By: Lars Pinks M.D.   On: 10/02/2013 15:41   Mr Breast Bilateral W Wo Contrast  10/10/2013   CLINICAL DATA:  Personal history of left breast cancer. Assess response to neoadjuvant therapy.  LABS:  Does not apply  EXAM: BILATERAL BREAST MRI WITH AND WITHOUT CONTRAST  TECHNIQUE: Multiplanar, multisequence MR images of both breasts were obtained prior to and following the intravenous administration of 15m of MultiHance.  THREE-DIMENSIONAL MR IMAGE RENDERING ON INDEPENDENT WORKSTATION:  Three-dimensional MR images were rendered by post-processing of the original MR data on an independent workstation. The three-dimensional MR images were interpreted, and findings are reported in the following complete MRI report for this study. Three dimensional images were evaluated at the independent DynaCad workstation  COMPARISON:  Previous exams  FINDINGS: Breast composition: c:  Heterogeneous fibroglandular tissue  Background parenchymal enhancement: Minimal  Right breast: No mass or abnormal enhancement.  Left breast: The previously noted enhancing spiculated mass is significantly smaller at the left breast 8 o'clock middle 1/3 depth measuring 1.3 x 1.2 x 0.9 cm with plateau kinetics.  Lymph nodes: The previously noted abnormal enhancing lymph node in the left axilla is significantly smaller, currently measuring 0.8 x 1.5 x 1.1 cm. The previously noted small left internal mammary lymph node is not seen.  Ancillary findings:  None.  IMPRESSION: Previously noted mass in the left  breast 8 o'clock is significantly smaller compared to prior exam. Previously noted enlarged lymph node in the left axilla is significantly smaller. The previously noted small left internal mammary lymph node is not seen.  RECOMMENDATION: Treatment plan.  BI-RADS CATEGORY  6: Known biopsy-proven malignancy.   Electronically Signed   By: WAbelardo DieselM.D.   On: 10/10/2013 12:48    ASSESSMENT: 48y.o. [place] woman status post left breast and left axillary lymph node biopsy 06/30/2013 for a clinical T2 N1-2, stage IIB/IIIA invasive ductal carcinoma, grade 2, estrogen and progesterone receptors both strongly positive, HER-2 nonamplified, with an MIB-1 of 36%.  (1) there is a 5 mm left internal mammary lymph node which was negative on PET scan  (2) cyclophosphamide and doxorubicin started 08/04/2014, given in dose dense fashion x4 with Neulasta day 2; completed 09/14/2013, to be followed by paclitaxel again given in dose dense fashion x4 with Neulasta support  PLAN: TTierrehad significant leg pain with her first cycle of paclitaxel. She took ibuprofen for that but it didn't quite take care of the problem. I am adding tramadol tablet hopefully she will not have it a second time. This is frequently a first dose effect.  Since she did so well as far as nausea is concerned, she will cut her Decadron down to one tablet twice daily with this cycle, and possibly 1 tablet in the morning only for the next cycle.  Her liver function tests are minimally elevated. This indeed could be from the Taxol. We will watch it, but certainly not change her interrupt her treatment at this point. We went over her MRI of the breast which shows a very favorable response.  We'll also reviewed her brain MRI, which was falling.  She will return in one week for followup and in 2 weeks for her next cycle of chemotherapy. At the end of her Taxol treatments she will have a repeat MRI of the breast before proceeding to definitive  surgery.  Chauncey Cruel, MD  Medical oncology   10/12/2013 9:49 AM

## 2013-10-12 NOTE — Telephone Encounter (Signed)
per pof to sch pt appt-sent email to MW to see if 5/28 can be moved to am trm

## 2013-10-12 NOTE — Patient Instructions (Addendum)
[facility] Discharge Instructions for Patients Receiving Chemotherapy  Today you received the following chemotherapy agents: taxol.  To help prevent nausea and vomiting after your treatment, we encourage you to take your nausea medication: compazine 10 mg every 6 hours as needed.   If you develop nausea and vomiting that is not controlled by your nausea medication, call the clinic.   BELOW ARE SYMPTOMS THAT SHOULD BE REPORTED IMMEDIATELY:  *FEVER GREATER THAN 100.5 F  *CHILLS WITH OR WITHOUT FEVER  NAUSEA AND VOMITING THAT IS NOT CONTROLLED WITH YOUR NAUSEA MEDICATION  *UNUSUAL SHORTNESS OF BREATH  *UNUSUAL BRUISING OR BLEEDING  TENDERNESS IN MOUTH AND THROAT WITH OR WITHOUT PRESENCE OF ULCERS  *URINARY PROBLEMS  *BOWEL PROBLEMS  UNUSUAL RASH Items with * indicate a potential emergency and should be followed up as soon as possible.  Feel free to call the clinic you have any questions or concerns. The clinic phone number is [phone].    

## 2013-10-13 ENCOUNTER — Ambulatory Visit (HOSPITAL_BASED_OUTPATIENT_CLINIC_OR_DEPARTMENT_OTHER): Payer: BC Managed Care – PPO

## 2013-10-13 ENCOUNTER — Encounter: Payer: Self-pay | Admitting: *Deleted

## 2013-10-13 VITALS — BP 116/59 | HR 78 | Temp 98.3°F

## 2013-10-13 DIAGNOSIS — C50312 Malignant neoplasm of lower-inner quadrant of left female breast: Secondary | ICD-10-CM

## 2013-10-13 DIAGNOSIS — Z5189 Encounter for other specified aftercare: Secondary | ICD-10-CM

## 2013-10-13 DIAGNOSIS — C50319 Malignant neoplasm of lower-inner quadrant of unspecified female breast: Secondary | ICD-10-CM

## 2013-10-13 MED ORDER — PEGFILGRASTIM INJECTION 6 MG/0.6ML
6.0000 mg | Freq: Once | SUBCUTANEOUS | Status: AC
Start: 2013-10-13 — End: 2013-10-13
  Administered 2013-10-13: 6 mg via SUBCUTANEOUS
  Filled 2013-10-13: qty 0.6

## 2013-10-13 NOTE — [facility] Oncology Navigator Note (Signed)
Patient at [facility] for Neulasta injection.  She reports that she is doing well today.  She reports that she had significant leg pain and a temp of 99 degrees for approximately 4 days after her last chemotherapy treatment which was her first treatment with Taxol.  She shared that Dr. Jana Hakim had explained that this reaction often only happens after the first treatment.  She denied any questions or other concerns at this time.  I encouraged her to call me for any other needs.

## 2013-10-17 ENCOUNTER — Telehealth: Payer: Self-pay | Admitting: Adult Health

## 2013-10-17 NOTE — Telephone Encounter (Signed)
cld pt to adv the gap in appt for 5/28-adv pt that we will get her ASAP

## 2013-10-19 ENCOUNTER — Other Ambulatory Visit (HOSPITAL_BASED_OUTPATIENT_CLINIC_OR_DEPARTMENT_OTHER): Payer: BC Managed Care – PPO

## 2013-10-19 ENCOUNTER — Other Ambulatory Visit: Payer: Self-pay | Admitting: *Deleted

## 2013-10-19 ENCOUNTER — Telehealth: Payer: Self-pay | Admitting: Oncology

## 2013-10-19 ENCOUNTER — Encounter: Payer: Self-pay | Admitting: *Deleted

## 2013-10-19 ENCOUNTER — Ambulatory Visit (HOSPITAL_BASED_OUTPATIENT_CLINIC_OR_DEPARTMENT_OTHER): Payer: BC Managed Care – PPO | Admitting: Physician Assistant

## 2013-10-19 ENCOUNTER — Encounter: Payer: Self-pay | Admitting: Physician Assistant

## 2013-10-19 VITALS — BP 109/69 | HR 93 | Temp 98.1°F | Resp 20 | Ht 64.5 in | Wt 202.1 lb

## 2013-10-19 DIAGNOSIS — C50312 Malignant neoplasm of lower-inner quadrant of left female breast: Secondary | ICD-10-CM

## 2013-10-19 DIAGNOSIS — C773 Secondary and unspecified malignant neoplasm of axilla and upper limb lymph nodes: Secondary | ICD-10-CM

## 2013-10-19 DIAGNOSIS — Z170 Estrogen receptor positive status [ER+]: Secondary | ICD-10-CM

## 2013-10-19 DIAGNOSIS — C50319 Malignant neoplasm of lower-inner quadrant of unspecified female breast: Secondary | ICD-10-CM

## 2013-10-19 LAB — COMPREHENSIVE METABOLIC PANEL (CC13)
ALT: 56 U/L — ABNORMAL HIGH (ref 0–55)
AST: 29 U/L (ref 5–34)
Albumin: 4 g/dL (ref 3.5–5.0)
Alkaline Phosphatase: 210 U/L — ABNORMAL HIGH (ref 40–150)
Anion Gap: 13 mEq/L — ABNORMAL HIGH (ref 3–11)
BUN: 7.3 mg/dL (ref 7.0–26.0)
CO2: 21 mEq/L — ABNORMAL LOW (ref 22–29)
Calcium: 9.6 mg/dL (ref 8.4–10.4)
Chloride: 108 mEq/L (ref 98–109)
Creatinine: 0.7 mg/dL (ref 0.6–1.1)
Glucose: 121 mg/dl (ref 70–140)
Potassium: 3.7 mEq/L (ref 3.5–5.1)
Sodium: 142 mEq/L (ref 136–145)
Total Bilirubin: 0.44 mg/dL (ref 0.20–1.20)
Total Protein: 6.9 g/dL (ref 6.4–8.3)

## 2013-10-19 LAB — CBC WITH DIFFERENTIAL/PLATELET
BASO%: 0.8 % (ref 0.0–2.0)
Basophils Absolute: 0.4 10*3/uL — ABNORMAL HIGH (ref 0.0–0.1)
EOS%: 1.8 % (ref 0.0–7.0)
Eosinophils Absolute: 0.9 10*3/uL — ABNORMAL HIGH (ref 0.0–0.5)
HCT: 27.2 % — ABNORMAL LOW (ref 34.8–46.6)
HGB: 8.8 g/dL — ABNORMAL LOW (ref 11.6–15.9)
LYMPH%: 4.4 % — ABNORMAL LOW (ref 14.0–49.7)
MCH: 27.5 pg (ref 25.1–34.0)
MCHC: 32.2 g/dL (ref 31.5–36.0)
MCV: 85.2 fL (ref 79.5–101.0)
MONO#: 2.8 10*3/uL — ABNORMAL HIGH (ref 0.1–0.9)
MONO%: 5.7 % (ref 0.0–14.0)
NEUT#: 43.2 10*3/uL — ABNORMAL HIGH (ref 1.5–6.5)
NEUT%: 87.3 % — ABNORMAL HIGH (ref 38.4–76.8)
Platelets: 233 10*3/uL (ref 145–400)
RBC: 3.19 10*6/uL — ABNORMAL LOW (ref 3.70–5.45)
RDW: 18.2 % — ABNORMAL HIGH (ref 11.2–14.5)
WBC: 49.5 10*3/uL — ABNORMAL HIGH (ref 3.9–10.3)
lymph#: 2.2 10*3/uL (ref 0.9–3.3)

## 2013-10-19 LAB — TECHNOLOGIST REVIEW

## 2013-10-19 MED ORDER — OXYCODONE-ACETAMINOPHEN 5-325 MG PO TABS: 1.0000 | ORAL_TABLET | Freq: Three times a day (TID) | ORAL | Status: DC | PRN

## 2013-10-19 NOTE — Telephone Encounter (Signed)
pt seen by AJ today - no pof sent. pt currently on schedule for tx and f/u appts for may/june. june 4th f/u moved from [facility] to [place] as she is an active tx pt. pt given new schedule.

## 2013-10-19 NOTE — [facility] Oncology Navigator Note (Signed)
Patient at [facility] for f/u appointment with Awilda Metro, PA.  She received Taxol, Cycle 2 of 4 on 10/12/13 and reports that she did not feel well for several days afterwards with aching and fatigue.  Patient feeling better today.  Patient to discuss with PA.  Patient denied any other questions or concerns at this time.  I encouraged her to call me for any needs.

## 2013-10-19 NOTE — Progress Notes (Signed)
[place]  Telephone:([phone] Fax:(336) [phone]     ID: Debbie [name] OB: June 21, 1965  MR#: [id number]  DVV#:[id number]  PCP: Debbie Schwalbe, MD GYN:  [name]: [name] OTHER MD: Cline Cools  CHIEF COMPLAINT: Follow up visit after chemotherapy  BREAST CANCER HISTORY: As per previously documented note:  Debbie [name] had routine yearly screening mammography at the breast center 06/19/2013 showing a possible mass in the left breast. The right breast was unremarkable. On 06/30/2013 additional views of the left breast showed an irregular spiculated dense mass measuring 2.2 cm in the lower inner quadrant. This was palpable at the 8:00 location. Ultrasound showed an irregular hypoechoic mass measuring 2.3 cm and in the left axilla a dominant abnormal appearing lymph node with cortical thickening measuring 1.2 cm.  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  [id number] the patient underwent bilateral breast MRI. This showed her breasts to be dense (composition C.). In the left breast at the 8:00 position there was a 2.4 cm enhancing mass containing a biopsy clip. There were no additional areas of concern. In the axilla on the left there was a 2.6 cm enlarged left axillary lymph node. There was also a 0.5 cm left internal mammary lymph nodes noted. The right side was unremarkable.  The patient's subsequent history is as detailed below.  INTERVAL HISTORY: Debbie [name] returns today for followup of her breast cancer. Status post 2 of 4 planned cycles of every 2 week  paclitaxel. She receives Neulasta on day 2   REVIEW OF SYSTEMS: She had significant pain in her legs, for up to 4 to 5 days after treatment. She also had low-grade temperatures with readings of 99.5-99.9. She reports that she did take Claritin with the Neulasta as advised. She requests a refill for her Percocet 5/325 mg tablets. She denied peripheral neuropathy,no significant nail changes, no mouth sores, no nausea or vomiting. A detailed review of systems today was otherwise noncontributory.  PAST MEDICAL HISTORY: Past Medical History  Diagnosis Date  . MVP (mitral valve prolapse)   . IBS (irritable bowel syndrome)   . Migraines     menstral  . Hyperlipidemia   . MVP (mitral valve prolapse)   . Migraines   . IBS (irritable bowel syndrome)   . Abnormal Pap smear   . Seasonal allergies   . Anxiety   . Menorrhagia   . Breast cancer 2015    ER+/PR+/Her2-    PAST SURGICAL HISTORY: Past Surgical History  Procedure Laterality Date  . Wisdom teeth removal    . Vulva /perineum biopsy  2011  . Svd       x 3  . Endometrial ablation    . Portacath placement Left 07/18/2013    Procedure: INSERTION PORT-A-CATH;  Surgeon: [name], MD;  Location: [facility] OR;  Service: General;  Laterality: Left;    FAMILY HISTORY Family History  Problem Relation Age of Onset  . Cancer Maternal Grandmother     BREAST AND UTERINE CANCER  . Uterine cancer Maternal Grandmother 84  . Hyperlipidemia Mother   . Hypertension Mother   .  Multiple myeloma Father 18  . Lung cancer Maternal Development worker, international aid  . Testicular cancer Other 38  The patient's father is living, currently age 93. He has a history of multiple myeloma. The patient's mother is 80. The patient's parents live in [place] The patient had no brothers, 2 sisters. There is no history of breast or ovarian cancer in the family. She believes one of her grandfathers died from lung cancer and another from pancreatic cancer, but is not  sure  GYNECOLOGIC HISTORY:  Menarche age 57, first live birth age 53, which the patient understands is an independent risk factor for breast cancer. She is GX P3. She still having regular periods. 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 Her husband Debbie [name] is a Energy manager at [employer]. Her 3 children are Debbie [name] (14), Debbie [name] (10) and Debbie [name] (7). The patient attends a local [employer]: Not in place   HEALTH MAINTENANCE: History  Substance Use Topics  . Smoking status: Never Smoker   . Smokeless tobacco: Never Used  . Alcohol Use: No     Colonoscopy: January 2015  MCN:[id number] 2014  Bone density:  Lipid panel:  Allergies  Allergen Reactions  . Dust Mite Extract Itching    Current Outpatient Prescriptions  Medication Sig Dispense Refill  . ALPRAZolam (XANAX) 0.5 MG tablet Take 1 tablet (0.5 mg total) by mouth 2 (two) times daily as needed for anxiety.  10 tablet  0  . atorvastatin (LIPITOR) 20 MG tablet Take 20 mg by mouth daily.      . cetirizine (ZYRTEC) 10 MG tablet Take 10 mg by mouth daily.      Marland Kitchen lidocaine-prilocaine (EMLA) cream Apply 1 application topically as needed. Apply over port site 1-2 hours before chemotherapy and cover with plastic wrap  30 g  0  . mometasone (NASONEX) 50 MCG/ACT nasal spray Place 2 sprays into the nose daily as needed (for allergies).       Marland Kitchen oxymetazoline (AFRIN) 0.05 % nasal spray Place 2 sprays into the nose daily as needed. congestion      . PARoxetine (PAXIL) 20 MG tablet Take 20 mg by mouth every morning.      Marland Kitchen RELPAX 40 MG tablet Take 40 mg by mouth every 2 (two) hours as needed for migraine.       . Vitamin D, Ergocalciferol, (DRISDOL) 50000 UNITS CAPS capsule Take 50,000 Units by mouth every 7 (seven) days.       . FROVA 2.5 MG tablet Take 2.5 mg by mouth daily as needed for migraine.       . gabapentin  (NEURONTIN) 300 MG capsule Take 1 capsule (300 mg total) by mouth at bedtime.  30 capsule  6  . LORazepam (ATIVAN) 0.5 MG tablet Take 1 tablet (0.5 mg total) by mouth at bedtime as needed (Nausea or vomiting).  30 tablet  0  . omeprazole (PRILOSEC) 20 MG capsule Take 1 capsule (20 mg total) by mouth 2 (two) times daily before a meal.  60 capsule  3  . oxyCODONE-acetaminophen (PERCOCET/ROXICET) 5-325 MG per tablet Take 1 tablet by mouth every 8 (eight) hours as needed for moderate pain or severe pain.  30 tablet  0  . prochlorperazine (COMPAZINE) 10 MG tablet Take 1 tablet (10 mg total) by mouth every 6 (six) hours as needed  for nausea or vomiting.  30 tablet  0  . rizatriptan (MAXALT-MLT) 10 MG disintegrating tablet       . traMADol (ULTRAM) 50 MG tablet Take 1 tablet (50 mg total) by mouth every 6 (six) hours as needed.  30 tablet  0   No current facility-administered medications for this visit.    OBJECTIVE: Middle-aged white woman  in no acute distress Filed Vitals:   10/19/13 1426  BP: 109/69  Pulse: 93  Temp: 98.1 F (36.7 C)  Resp: 20     Body mass index is 34.17 kg/(m^2).    ECOG FS:0 - Asymptomatic  Sclerae unicteric, EOMs intact Oropharynx clear and moist No cervical or supraclavicular adenopathy Lungs no rales or rhonchi Heart regular rate and rhythm Abd soft, obese, nontender, positive bowel sounds MSK no focal spinal tenderness, no upper extremity lymphedema Neuro: nonfocal, well oriented, positive affect Breasts: The left breast mass is no longer palpable. The left axilla is benign. The right breast is unremarkable LAB RESULTS:  CMP     Component Value Date/Time   NA 142 10/19/2013 1407   K 3.7 10/19/2013 1407   CO2 21* 10/19/2013 1407   GLUCOSE 121 10/19/2013 1407   BUN 7.3 10/19/2013 1407   CREATININE 0.7 10/19/2013 1407   CALCIUM 9.6 10/19/2013 1407   PROT 6.9 10/19/2013 1407   ALBUMIN 4.0 10/19/2013 1407   AST 29 10/19/2013 1407   ALT 56* 10/19/2013 1407   ALKPHOS  210* 10/19/2013 1407   BILITOT 0.44 10/19/2013 1407    I No results found for this basename: SPEP,  UPEP,   kappa and lambda light chains    Lab Results  Component Value Date   WBC 49.5* 10/19/2013   NEUTROABS 43.2* 10/19/2013   HGB 8.8* 10/19/2013   HCT 27.2* 10/19/2013   MCV 85.2 10/19/2013   PLT 233 10/19/2013      Chemistry      Component Value Date/Time   NA 142 10/19/2013 1407   K 3.7 10/19/2013 1407   CO2 21* 10/19/2013 1407   BUN 7.3 10/19/2013 1407   CREATININE 0.7 10/19/2013 1407      Component Value Date/Time   CALCIUM 9.6 10/19/2013 1407   ALKPHOS 210* 10/19/2013 1407   AST 29 10/19/2013 1407   ALT 56* 10/19/2013 1407   BILITOT 0.44 10/19/2013 1407       No results found for this basename: LABCA2    No components found with this basename: LABCA125    No results found for this basename: INR,  in the last 168 hours  Urinalysis    Component Value Date/Time   BILIRUBINUR Negative 09/22/2011 1451   PROTEINUR Trace 09/22/2011 1451   UROBILINOGEN negative 09/22/2011 1451   NITRITE Negative 09/22/2011 1451   LEUKOCYTESUR Trace 09/22/2011 1451    STUDIES: Mr Debbie [name] Wo Contrast  11/01/2013   CLINICAL DATA:  48 year old female with persistent headache and known breast cancer. Initial encounter.  EXAM: MRI HEAD WITHOUT AND WITH CONTRAST  TECHNIQUE: Multiplanar, multiecho pulse sequences of the brain and surrounding structures were obtained without and with intravenous contrast.  CONTRAST:  38m MULTIHANCE GADOBENATE DIMEGLUMINE 529 MG/ML IV SOLN  COMPARISON:  None.  FINDINGS: Cerebral volume is normal. No restricted diffusion to suggest acute infarction. No midline shift, mass effect, evidence of mass lesion, ventriculomegaly, extra-axial collection or acute intracranial hemorrhage. Cervicomedullary junction and pituitary are within normal limits. Major intracranial vascular flow voids are preserved. No abnormal enhancement identified. GPearline Cablesand white  matter signal is within normal  limits for age throughout the brain.  Visible internal auditory structures appear normal. Mastoids are clear. Paranasal sinuses are clear. Visualized orbit soft tissues are within normal limits. Visualized scalp soft tissues are within normal limits.  Visualized bone marrow signal is mildly diminished throughout. No destructive or suspicious osseous lesion identified.  IMPRESSION: No acute or metastatic intracranial abnormality. Negative MRI appearance of the brain.   Electronically Signed   By: Debbie [name] M.D.   On: 10/02/2013 15:41   Mr Breast Bilateral W Wo Contrast  10/10/2013   CLINICAL DATA:  Personal history of left breast cancer. Assess response to neoadjuvant therapy.  LABS:  Does not apply  EXAM: BILATERAL BREAST MRI WITH AND WITHOUT CONTRAST  TECHNIQUE: Multiplanar, multisequence MR images of both breasts were obtained prior to and following the intravenous administration of 49m of MultiHance.  THREE-DIMENSIONAL MR IMAGE RENDERING ON INDEPENDENT WORKSTATION:  Three-dimensional MR images were rendered by post-processing of the original MR data on an independent workstation. The three-dimensional MR images were interpreted, and findings are reported in the following complete MRI report for this study. Three dimensional images were evaluated at the independent DynaCad workstation  COMPARISON:  Previous exams  FINDINGS: Breast composition: c:  Heterogeneous fibroglandular tissue  Background parenchymal enhancement: Minimal  Right breast: No mass or abnormal enhancement.  Left breast: The previously noted enhancing spiculated mass is significantly smaller at the left breast 8 o'clock middle 1/3 depth measuring 1.3 x 1.2 x 0.9 cm with plateau kinetics.  Lymph nodes: The previously noted abnormal enhancing lymph node in the left axilla is significantly smaller, currently measuring 0.8 x 1.5 x 1.1 cm. The previously noted small left internal mammary lymph node is not seen.  Ancillary findings:  None.   IMPRESSION: Previously noted mass in the left breast 8 o'clock is significantly smaller compared to prior exam. Previously noted enlarged lymph node in the left axilla is significantly smaller. The previously noted small left internal mammary lymph node is not seen.  RECOMMENDATION: Treatment plan.  BI-RADS CATEGORY  6: Known biopsy-proven malignancy.   Electronically Signed   By: WAbelardo DieselM.D.   On: 10/10/2013 12:48    ASSESSMENT: 48y.o. [place] woman status post left breast and left axillary lymph node biopsy 06/30/2013 for a clinical T2 N1-2, stage IIB/IIIA invasive ductal carcinoma, grade 2, estrogen and progesterone receptors both strongly positive, HER-2 nonamplified, with an MIB-1 of 36%.  (1) there is a 5 mm left internal mammary lymph node which was negative on PET scan  (2) cyclophosphamide and doxorubicin started 08/04/2014, given in dose dense fashion x4 with Neulasta day 2; completed 09/14/2013, to be followed by paclitaxel again given in dose dense fashion x4 with Neulasta support  PLAN: TTrenellhad significant leg pain with her first two cycles of paclitaxel. She was given a refill prescription for her Percocet tablets to take as needed for pain management. Because the pain is such a significant complaint of hers, after discussion and review with Dr. nValaria Goodnot, she was offered 2 options either to continue with the last 2 cycles of docetaxel every 2 weeks as currently scheduled for be given smaller weekly doses for the last 2 cycles. This would be given weekly for 3 weeks for each of the last 2 cycles for total 6 more weeks of therapy patient states that she has a trip planned at the end of June and will continue with her chemotherapy as he is currently scheduled  every 2 weeks.   She'll continue her Decadron as previously prescribed for nausea.  We will continue to monitor her LFTs closely.  She will return in one week for followup and in 1 weeks for her next cycle of chemotherapy.  At the end of her Taxol treatments she will have a repeat MRI of the breast before proceeding to definitive surgery.  Carlton Adam, PA-C  Medical oncology   10/19/2013 5:47 PM

## 2013-10-22 NOTE — Patient Instructions (Signed)
Continue to take Claritin as advised. Continue to take your pain medication also as prescribed Followup in 1 week as previously scheduled

## 2013-10-25 ENCOUNTER — Other Ambulatory Visit: Payer: Self-pay

## 2013-10-25 ENCOUNTER — Other Ambulatory Visit: Payer: Self-pay | Admitting: *Deleted

## 2013-10-25 DIAGNOSIS — C50312 Malignant neoplasm of lower-inner quadrant of left female breast: Secondary | ICD-10-CM

## 2013-10-26 ENCOUNTER — Other Ambulatory Visit: Payer: Self-pay | Admitting: Oncology

## 2013-10-26 ENCOUNTER — Ambulatory Visit (HOSPITAL_BASED_OUTPATIENT_CLINIC_OR_DEPARTMENT_OTHER): Payer: BC Managed Care – PPO | Admitting: Adult Health

## 2013-10-26 ENCOUNTER — Encounter: Payer: Self-pay | Admitting: Adult Health

## 2013-10-26 ENCOUNTER — Ambulatory Visit (HOSPITAL_BASED_OUTPATIENT_CLINIC_OR_DEPARTMENT_OTHER): Payer: BC Managed Care – PPO

## 2013-10-26 ENCOUNTER — Other Ambulatory Visit: Payer: BC Managed Care – PPO

## 2013-10-26 ENCOUNTER — Encounter: Payer: Self-pay | Admitting: Oncology

## 2013-10-26 VITALS — BP 126/77 | HR 87 | Temp 98.4°F | Resp 18 | Ht 64.5 in | Wt 199.3 lb

## 2013-10-26 DIAGNOSIS — C50312 Malignant neoplasm of lower-inner quadrant of left female breast: Secondary | ICD-10-CM

## 2013-10-26 DIAGNOSIS — C50319 Malignant neoplasm of lower-inner quadrant of unspecified female breast: Secondary | ICD-10-CM

## 2013-10-26 DIAGNOSIS — Z170 Estrogen receptor positive status [ER+]: Secondary | ICD-10-CM

## 2013-10-26 DIAGNOSIS — Z5111 Encounter for antineoplastic chemotherapy: Secondary | ICD-10-CM

## 2013-10-26 LAB — CBC WITH DIFFERENTIAL/PLATELET
BASO%: 0.7 % (ref 0.0–2.0)
Basophils Absolute: 0.1 10*3/uL (ref 0.0–0.1)
EOS%: 3.7 % (ref 0.0–7.0)
Eosinophils Absolute: 0.7 10*3/uL — ABNORMAL HIGH (ref 0.0–0.5)
HCT: 30.4 % — ABNORMAL LOW (ref 34.8–46.6)
HGB: 9.5 g/dL — ABNORMAL LOW (ref 11.6–15.9)
LYMPH%: 10.2 % — ABNORMAL LOW (ref 14.0–49.7)
MCH: 27.3 pg (ref 25.1–34.0)
MCHC: 31.3 g/dL — ABNORMAL LOW (ref 31.5–36.0)
MCV: 87.4 fL (ref 79.5–101.0)
MONO#: 1.8 10*3/uL — ABNORMAL HIGH (ref 0.1–0.9)
MONO%: 9.6 % (ref 0.0–14.0)
NEUT#: 14.3 10*3/uL — ABNORMAL HIGH (ref 1.5–6.5)
NEUT%: 75.8 % (ref 38.4–76.8)
Platelets: 214 10*3/uL (ref 145–400)
RBC: 3.48 10*6/uL — ABNORMAL LOW (ref 3.70–5.45)
RDW: 19.2 % — ABNORMAL HIGH (ref 11.2–14.5)
WBC: 18.9 10*3/uL — ABNORMAL HIGH (ref 3.9–10.3)
lymph#: 1.9 10*3/uL (ref 0.9–3.3)

## 2013-10-26 LAB — COMPREHENSIVE METABOLIC PANEL (CC13)
ALT: 43 U/L (ref 0–55)
AST: 26 U/L (ref 5–34)
Albumin: 4 g/dL (ref 3.5–5.0)
Alkaline Phosphatase: 124 U/L (ref 40–150)
Anion Gap: 13 mEq/L — ABNORMAL HIGH (ref 3–11)
BUN: 9.4 mg/dL (ref 7.0–26.0)
CO2: 19 mEq/L — ABNORMAL LOW (ref 22–29)
Calcium: 9.5 mg/dL (ref 8.4–10.4)
Chloride: 110 mEq/L — ABNORMAL HIGH (ref 98–109)
Creatinine: 0.7 mg/dL (ref 0.6–1.1)
Glucose: 113 mg/dl (ref 70–140)
Potassium: 4.3 mEq/L (ref 3.5–5.1)
Sodium: 142 mEq/L (ref 136–145)
Total Bilirubin: 0.57 mg/dL (ref 0.20–1.20)
Total Protein: 6.9 g/dL (ref 6.4–8.3)

## 2013-10-26 MED ORDER — ONDANSETRON 8 MG/50ML IVPB (CHCC)
8.0000 mg | Freq: Once | INTRAVENOUS | Status: AC
  Administered 2013-10-26: 8 mg via INTRAVENOUS

## 2013-10-26 MED ORDER — DEXAMETHASONE SODIUM PHOSPHATE 20 MG/5ML IJ SOLN
20.0000 mg | Freq: Once | INTRAMUSCULAR | Status: AC
  Administered 2013-10-26: 20 mg via INTRAVENOUS

## 2013-10-26 MED ORDER — DIPHENHYDRAMINE HCL 50 MG/ML IJ SOLN
25.0000 mg | Freq: Once | INTRAMUSCULAR | Status: AC
  Administered 2013-10-26: 25 mg via INTRAVENOUS

## 2013-10-26 MED ORDER — DEXTROSE 5 % IV SOLN
175.0000 mg/m2 | Freq: Once | INTRAVENOUS | Status: AC
  Administered 2013-10-26: 360 mg via INTRAVENOUS
  Filled 2013-10-26: qty 60

## 2013-10-26 MED ORDER — FAMOTIDINE IN NACL 20-0.9 MG/50ML-% IV SOLN
INTRAVENOUS | Status: AC
  Filled 2013-10-26: qty 50

## 2013-10-26 MED ORDER — FAMOTIDINE IN NACL 20-0.9 MG/50ML-% IV SOLN
20.0000 mg | Freq: Once | INTRAVENOUS | Status: AC
  Administered 2013-10-26: 20 mg via INTRAVENOUS

## 2013-10-26 MED ORDER — SODIUM CHLORIDE 0.9 % IJ SOLN
10.0000 mL | INTRAMUSCULAR | Status: DC | PRN
  Administered 2013-10-26: 10 mL
  Filled 2013-10-26: qty 10

## 2013-10-26 MED ORDER — HEPARIN SOD (PORK) LOCK FLUSH 100 UNIT/ML IV SOLN
500.0000 [IU] | Freq: Once | INTRAVENOUS | Status: AC | PRN
  Administered 2013-10-26: 500 [IU]
  Filled 2013-10-26: qty 5

## 2013-10-26 MED ORDER — DIPHENHYDRAMINE HCL 50 MG/ML IJ SOLN
INTRAMUSCULAR | Status: AC
  Filled 2013-10-26: qty 1

## 2013-10-26 MED ORDER — DEXAMETHASONE SODIUM PHOSPHATE 20 MG/5ML IJ SOLN
INTRAMUSCULAR | Status: AC
  Filled 2013-10-26: qty 5

## 2013-10-26 MED ORDER — ONDANSETRON 8 MG/NS 50 ML IVPB
INTRAVENOUS | Status: AC
  Filled 2013-10-26: qty 8

## 2013-10-26 MED ORDER — SODIUM CHLORIDE 0.9 % IV SOLN
Freq: Once | INTRAVENOUS | Status: AC
  Administered 2013-10-26: 14:00:00 via INTRAVENOUS

## 2013-10-26 NOTE — Progress Notes (Signed)
[facility]  [phone]) [phone] Fax:(336) [phone]     ID: Debbie [name] OB: 03/28/1966  MR#: [id number]  JYN#:[id number]  PCP: [name], MD GYN:  [name]: [name] OTHER MD: Cline Cools  CHIEF COMPLAINT: Follow up visit after chemotherapy  BREAST CANCER HISTORY: As per previously documented note:  Mabrey had routine yearly screening mammography at the breast center 06/19/2013 showing a possible mass in the left breast. The right breast was unremarkable. On 06/30/2013 additional views of the left breast showed an irregular spiculated dense mass measuring 2.2 cm in the lower inner quadrant. This was palpable at the 8:00 location. Ultrasound showed an irregular hypoechoic mass measuring 2.3 cm and in the left axilla a dominant abnormal appearing lymph node with cortical thickening measuring 1.2 cm.  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  [id number] the patient underwent bilateral breast MRI. This showed her breasts to be dense (composition C.). In the left breast at the 8:00 position there was a 2.4 cm enhancing mass containing a biopsy clip. There were no additional areas of concern. In the axilla on the left there was a 2.6 cm enlarged left axillary lymph node. There was also a 0.5 cm left internal mammary lymph nodes noted. The right side was unremarkable.  The patient's subsequent history is as detailed below.  INTERVAL HISTORY: [name] returns today for followup of her breast cancer. She is here for evaluation prior to receiving cycle 3 of  4 planned cycles of every 2 week paclitaxel. She receives Neulasta on day 2.  She is doing well today.  She is tolerating her treatment very well so far.  She does experience low grade fevers and joint aches, following treatment, but otherwise denies fevers, chills, nausea, vomiting, constipation, diarrhea, numbness/tingling, mouth pain, skin changes, nail changes, or any further concerns.      REVIEW OF SYSTEMS: A 10 point review of systems was conducted and is otherwise negative except for what is noted above.     PAST MEDICAL HISTORY: Past Medical History  Diagnosis Date  . MVP (mitral valve prolapse)   . IBS (irritable bowel syndrome)   . Migraines     menstral  . Hyperlipidemia   . MVP (mitral valve prolapse)   . Migraines   . IBS (irritable bowel syndrome)   . Abnormal Pap smear   . Seasonal allergies   . Anxiety   . Menorrhagia   . Breast cancer 2015    ER+/PR+/Her2-    PAST SURGICAL HISTORY: Past Surgical History  Procedure Laterality Date  . Wisdom teeth removal    . Vulva /perineum biopsy  2011  . Svd       x 3  . Endometrial ablation    . Portacath placement Left 07/18/2013    Procedure: INSERTION PORT-A-CATH;  Surgeon: [name], MD;  Location: [facility] OR;  Service: General;  Laterality: Left;    FAMILY HISTORY Family History  Problem Relation Age of Onset  . Cancer Maternal Grandmother     BREAST AND UTERINE CANCER  . Uterine cancer Maternal Grandmother 84  .  Hyperlipidemia Mother   . Hypertension Mother   . Multiple myeloma Father 61  . Lung cancer Maternal Development worker, international aid  . Testicular cancer Other 21  The patient's father is living, currently age 68. He has a history of multiple myeloma. The patient's mother is 44. The patient's parents live in [place] The patient had no brothers, 2 sisters. There is no history of breast or ovarian cancer in the family. She believes one of her grandfathers died from lung cancer and another from  pancreatic cancer, but is not sure  GYNECOLOGIC HISTORY:  Menarche age 48, first live birth age 11, which the patient understands is an independent risk factor for breast cancer. She is GX P3. She still having regular periods. 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 Her husband [name] is a Energy manager at [employer]. Her 3 children are [name] (14), [name] (10) and [name] (7). The patient attends a local [place]: Not in place   HEALTH MAINTENANCE: History  Substance Use Topics  . Smoking status: Never Smoker   . Smokeless tobacco: Never Used  . Alcohol Use: No     Colonoscopy: January 2015  GXQ:JJHERDEY 2014  Bone density:  Lipid panel:  Allergies  Allergen Reactions  . Dust Mite Extract Itching    Current Outpatient Prescriptions  Medication Sig Dispense Refill  . atorvastatin (LIPITOR) 20 MG tablet Take 20 mg by mouth daily.      . cetirizine (ZYRTEC) 10 MG tablet Take 10 mg by mouth daily.      Marland Kitchen gabapentin (NEURONTIN) 300 MG capsule Take 1 capsule (300 mg total) by mouth at bedtime.  30 capsule  6  . lidocaine-prilocaine (EMLA) cream Apply 1 application topically as needed. Apply over port site 1-2 hours before chemotherapy and cover with plastic wrap  30 g  0  . mometasone (NASONEX) 50 MCG/ACT nasal spray Place 2 sprays into the nose daily as needed (for allergies).       Marland Kitchen omeprazole (PRILOSEC) 20 MG capsule Take 1 capsule (20 mg total) by mouth 2 (two) times daily before a meal.  60 capsule  3  . oxymetazoline (AFRIN) 0.05 % nasal spray Place 2 sprays into the nose daily as needed. congestion      . PARoxetine (PAXIL) 20 MG tablet Take 20 mg by mouth every morning.      Marland Kitchen RELPAX 40 MG tablet Take 40 mg by mouth every 2 (two) hours as needed for migraine.       . Vitamin D, Ergocalciferol, (DRISDOL) 50000 UNITS CAPS capsule Take 50,000  Units by mouth every 7 (seven) days.       . ALPRAZolam (XANAX) 0.5 MG tablet Take 1 tablet (0.5 mg total) by mouth 2 (two) times daily as needed for anxiety.  10 tablet  0  . FROVA 2.5 MG tablet Take 2.5 mg by mouth daily as needed for migraine.       Marland Kitchen LORazepam (ATIVAN) 0.5 MG tablet Take 1 tablet (0.5 mg total) by mouth at bedtime as needed (Nausea or vomiting).  30 tablet  0  . oxyCODONE-acetaminophen (PERCOCET/ROXICET) 5-325 MG per tablet Take 1 tablet by mouth every 8 (eight) hours as needed for moderate pain or severe pain.  30 tablet  0  . prochlorperazine (COMPAZINE) 10 MG tablet Take 1 tablet (10  mg total) by mouth every 6 (six) hours as needed for nausea or vomiting.  30 tablet  0  . rizatriptan (MAXALT-MLT) 10 MG disintegrating tablet       . traMADol (ULTRAM) 50 MG tablet Take 1 tablet (50 mg total) by mouth every 6 (six) hours as needed.  30 tablet  0   No current facility-administered medications for this visit.    OBJECTIVE: Middle-aged white woman  in no acute distress Filed Vitals:   10/26/13 1134  BP: 126/77  Pulse: 87  Temp: 98.4 F (36.9 C)  Resp: 18     Body mass index is 33.69 kg/(m^2).    ECOG FS:0 - Asymptomatic  Sclerae unicteric, EOMs intact Oropharynx clear and moist No cervical or supraclavicular adenopathy Lungs no rales or rhonchi Heart regular rate and rhythm Abd soft, obese, nontender, positive bowel sounds MSK no focal spinal tenderness, no upper extremity lymphedema Neuro: nonfocal, well oriented, positive affect Breasts: The left breast mass is no longer palpable. The left axilla is benign. The right breast is unremarkable LAB RESULTS:  CMP     Component Value Date/Time   NA 142 10/26/2013 1104   K 4.3 10/26/2013 1104   CO2 19* 10/26/2013 1104   GLUCOSE 113 10/26/2013 1104   BUN 9.4 10/26/2013 1104   CREATININE 0.7 10/26/2013 1104   CALCIUM 9.5 10/26/2013 1104   PROT 6.9 10/26/2013 1104   ALBUMIN 4.0 10/26/2013 1104   AST 26 10/26/2013 1104    ALT 43 10/26/2013 1104   ALKPHOS 124 10/26/2013 1104   BILITOT 0.57 10/26/2013 1104    I No results found for this basename: SPEP,  UPEP,   kappa and lambda light chains    Lab Results  Component Value Date   WBC 18.9* 10/26/2013   NEUTROABS 14.3* 10/26/2013   HGB 9.5* 10/26/2013   HCT 30.4* 10/26/2013   MCV 87.4 10/26/2013   PLT 214 10/26/2013      Chemistry      Component Value Date/Time   NA 142 10/26/2013 1104   K 4.3 10/26/2013 1104   CO2 19* 10/26/2013 1104   BUN 9.4 10/26/2013 1104   CREATININE 0.7 10/26/2013 1104      Component Value Date/Time   CALCIUM 9.5 10/26/2013 1104   ALKPHOS 124 10/26/2013 1104   AST 26 10/26/2013 1104   ALT 43 10/26/2013 1104   BILITOT 0.57 10/26/2013 1104       No results found for this basename: LABCA2    No components found with this basename: LABCA125    No results found for this basename: INR,  in the last 168 hours  Urinalysis    Component Value Date/Time   BILIRUBINUR Negative 09/22/2011 1451   PROTEINUR Trace 09/22/2011 1451   UROBILINOGEN negative 09/22/2011 1451   NITRITE Negative 09/22/2011 1451   LEUKOCYTESUR Trace 09/22/2011 1451    STUDIES: Mr Jeri Cos Wo Contrast  Oct 24, 2013   CLINICAL DATA:  48 year old female with persistent headache and known breast cancer. Initial encounter.  EXAM: MRI HEAD WITHOUT AND WITH CONTRAST  TECHNIQUE: Multiplanar, multiecho pulse sequences of the brain and surrounding structures were obtained without and with intravenous contrast.  CONTRAST:  23m MULTIHANCE GADOBENATE DIMEGLUMINE 529 MG/ML IV SOLN  COMPARISON:  None.  FINDINGS: Cerebral volume is normal. No restricted diffusion to suggest acute infarction. No midline shift, mass effect, evidence of mass lesion, ventriculomegaly, extra-axial collection or acute intracranial hemorrhage. Cervicomedullary junction and pituitary are within normal limits. Major intracranial vascular flow  voids are preserved. No abnormal enhancement identified. Pearline Cables and white  matter signal is within normal limits for age throughout the brain.  Visible internal auditory structures appear normal. Mastoids are clear. Paranasal sinuses are clear. Visualized orbit soft tissues are within normal limits. Visualized scalp soft tissues are within normal limits.  Visualized bone marrow signal is mildly diminished throughout. No destructive or suspicious osseous lesion identified.  IMPRESSION: No acute or metastatic intracranial abnormality. Negative MRI appearance of the brain.   Electronically Signed   By: Lars Pinks M.D.   On: 10/02/2013 15:41   Mr Breast Bilateral W Wo Contrast  10/10/2013   CLINICAL DATA:  Personal history of left breast cancer. Assess response to neoadjuvant therapy.  LABS:  Does not apply  EXAM: BILATERAL BREAST MRI WITH AND WITHOUT CONTRAST  TECHNIQUE: Multiplanar, multisequence MR images of both breasts were obtained prior to and following the intravenous administration of 26m of MultiHance.  THREE-DIMENSIONAL MR IMAGE RENDERING ON INDEPENDENT WORKSTATION:  Three-dimensional MR images were rendered by post-processing of the original MR data on an independent workstation. The three-dimensional MR images were interpreted, and findings are reported in the following complete MRI report for this study. Three dimensional images were evaluated at the independent DynaCad workstation  COMPARISON:  Previous exams  FINDINGS: Breast composition: c:  Heterogeneous fibroglandular tissue  Background parenchymal enhancement: Minimal  Right breast: No mass or abnormal enhancement.  Left breast: The previously noted enhancing spiculated mass is significantly smaller at the left breast 8 o'clock middle 1/3 depth measuring 1.3 x 1.2 x 0.9 cm with plateau kinetics.  Lymph nodes: The previously noted abnormal enhancing lymph node in the left axilla is significantly smaller, currently measuring 0.8 x 1.5 x 1.1 cm. The previously noted small left internal mammary lymph node is not seen.   Ancillary findings:  None.  IMPRESSION: Previously noted mass in the left breast 8 o'clock is significantly smaller compared to prior exam. Previously noted enlarged lymph node in the left axilla is significantly smaller. The previously noted small left internal mammary lymph node is not seen.  RECOMMENDATION: Treatment plan.  BI-RADS CATEGORY  6: Known biopsy-proven malignancy.   Electronically Signed   By: WAbelardo DieselM.D.   On: 10/10/2013 12:48    ASSESSMENT: 48y.o. [place] woman status post left breast and left axillary lymph node biopsy 06/30/2013 for a clinical T2 N1-2, stage IIB/IIIA invasive ductal carcinoma, grade 2, estrogen and progesterone receptors both strongly positive, HER-2 nonamplified, with an MIB-1 of 36%.  (1) there is a 5 mm left internal mammary lymph node which was negative on PET scan  (2) cyclophosphamide and doxorubicin started 08/04/2014, given in dose dense fashion x4 with Neulasta day 2; completed 09/14/2013, to be followed by paclitaxel again given in dose dense fashion x4 with Neulasta support  PLAN:  Mrs. RSipleis doing well today.  Her labs are stable.  I reviewed them with her in detail.  She is doing well and she will proceed with Taxol chemotherapy today.    She will return tomorrow for Neulasta, and  in one week for followup to evaluate for any chemo toxicities. At the end of her Taxol treatments she will have a repeat MRI of the breast before proceeding to definitive surgery. She knows to call [facility] the interim for any questions or concerns.  We can certainly see her sooner if needed.  I spent 15 minutes counseling the patient face to face.  The total time spent in  the appointment was 30 minutes.  Minette Headland, [place] [phone]      10/26/2013 12:22 PM

## 2013-10-26 NOTE — Progress Notes (Signed)
Insurance is paying Neulasta- [phone]

## 2013-10-26 NOTE — Patient Instructions (Signed)
[facility] Discharge Instructions for Patients Receiving Chemotherapy  Today you received the following chemotherapy agents taxol  To help prevent nausea and vomiting after your treatment, we encourage you to take your nausea medication as directed   If you develop nausea and vomiting that is not controlled by your nausea medication, call the clinic.   BELOW ARE SYMPTOMS THAT SHOULD BE REPORTED IMMEDIATELY:  *FEVER GREATER THAN 100.5 F  *CHILLS WITH OR WITHOUT FEVER  NAUSEA AND VOMITING THAT IS NOT CONTROLLED WITH YOUR NAUSEA MEDICATION  *UNUSUAL SHORTNESS OF BREATH  *UNUSUAL BRUISING OR BLEEDING  TENDERNESS IN MOUTH AND THROAT WITH OR WITHOUT PRESENCE OF ULCERS  *URINARY PROBLEMS  *BOWEL PROBLEMS  UNUSUAL RASH Items with * indicate a potential emergency and should be followed up as soon as possible.  Feel free to call the clinic you have any questions or concerns. The clinic phone number is [phone].  

## 2013-10-27 ENCOUNTER — Ambulatory Visit (HOSPITAL_BASED_OUTPATIENT_CLINIC_OR_DEPARTMENT_OTHER): Payer: BC Managed Care – PPO

## 2013-10-27 VITALS — BP 130/54 | HR 81 | Temp 98.5°F

## 2013-10-27 DIAGNOSIS — C50312 Malignant neoplasm of lower-inner quadrant of left female breast: Secondary | ICD-10-CM

## 2013-10-27 DIAGNOSIS — Z5189 Encounter for other specified aftercare: Secondary | ICD-10-CM

## 2013-10-27 DIAGNOSIS — C50319 Malignant neoplasm of lower-inner quadrant of unspecified female breast: Secondary | ICD-10-CM

## 2013-10-27 MED ORDER — PEGFILGRASTIM INJECTION 6 MG/0.6ML
6.0000 mg | Freq: Once | SUBCUTANEOUS | Status: AC
  Administered 2013-10-27: 6 mg via SUBCUTANEOUS
  Filled 2013-10-27: qty 0.6

## 2013-10-31 ENCOUNTER — Telehealth: Payer: Self-pay | Admitting: Oncology

## 2013-10-31 NOTE — Telephone Encounter (Signed)
pt cld stating may not be able to keep appt-cld pt back and left message to call to see if a r/s can be done. Adv most provisers are booked at this time

## 2013-11-01 ENCOUNTER — Other Ambulatory Visit (HOSPITAL_BASED_OUTPATIENT_CLINIC_OR_DEPARTMENT_OTHER): Payer: BC Managed Care – PPO

## 2013-11-01 ENCOUNTER — Telehealth: Payer: Self-pay | Admitting: Physician Assistant

## 2013-11-01 ENCOUNTER — Other Ambulatory Visit: Payer: Self-pay | Admitting: Physician Assistant

## 2013-11-01 ENCOUNTER — Telehealth: Payer: Self-pay | Admitting: Oncology

## 2013-11-01 ENCOUNTER — Other Ambulatory Visit: Payer: Self-pay | Admitting: *Deleted

## 2013-11-01 ENCOUNTER — Ambulatory Visit: Payer: BC Managed Care – PPO | Admitting: Physician Assistant

## 2013-11-01 DIAGNOSIS — C50312 Malignant neoplasm of lower-inner quadrant of left female breast: Secondary | ICD-10-CM

## 2013-11-01 DIAGNOSIS — C50319 Malignant neoplasm of lower-inner quadrant of unspecified female breast: Secondary | ICD-10-CM

## 2013-11-01 LAB — CBC WITH DIFFERENTIAL/PLATELET
BASO%: 0.7 % (ref 0.0–2.0)
Basophils Absolute: 0.2 10*3/uL — ABNORMAL HIGH (ref 0.0–0.1)
EOS%: 2.6 % (ref 0.0–7.0)
Eosinophils Absolute: 0.9 10*3/uL — ABNORMAL HIGH (ref 0.0–0.5)
HCT: 26.6 % — ABNORMAL LOW (ref 34.8–46.6)
HGB: 8.6 g/dL — ABNORMAL LOW (ref 11.6–15.9)
LYMPH%: 4.9 % — ABNORMAL LOW (ref 14.0–49.7)
MCH: 27.7 pg (ref 25.1–34.0)
MCHC: 32.2 g/dL (ref 31.5–36.0)
MCV: 85.9 fL (ref 79.5–101.0)
MONO#: 2.3 10*3/uL — ABNORMAL HIGH (ref 0.1–0.9)
MONO%: 6.5 % (ref 0.0–14.0)
NEUT#: 30 10*3/uL — ABNORMAL HIGH (ref 1.5–6.5)
NEUT%: 85.3 % — ABNORMAL HIGH (ref 38.4–76.8)
Platelets: 206 10*3/uL (ref 145–400)
RBC: 3.1 10*6/uL — ABNORMAL LOW (ref 3.70–5.45)
RDW: 19.1 % — ABNORMAL HIGH (ref 11.2–14.5)
WBC: 35.2 10*3/uL — ABNORMAL HIGH (ref 3.9–10.3)
lymph#: 1.7 10*3/uL (ref 0.9–3.3)

## 2013-11-01 LAB — COMPREHENSIVE METABOLIC PANEL (CC13)
ALT: 54 U/L (ref 0–55)
AST: 32 U/L (ref 5–34)
Albumin: 4 g/dL (ref 3.5–5.0)
Alkaline Phosphatase: 187 U/L — ABNORMAL HIGH (ref 40–150)
Anion Gap: 15 mEq/L — ABNORMAL HIGH (ref 3–11)
BUN: 8.8 mg/dL (ref 7.0–26.0)
CO2: 20 mEq/L — ABNORMAL LOW (ref 22–29)
Calcium: 9.4 mg/dL (ref 8.4–10.4)
Chloride: 106 mEq/L (ref 98–109)
Creatinine: 0.7 mg/dL (ref 0.6–1.1)
Glucose: 87 mg/dl (ref 70–140)
Potassium: 4.3 mEq/L (ref 3.5–5.1)
Sodium: 141 mEq/L (ref 136–145)
Total Bilirubin: 0.43 mg/dL (ref 0.20–1.20)
Total Protein: 6.7 g/dL (ref 6.4–8.3)

## 2013-11-01 NOTE — Telephone Encounter (Signed)
pt cld to chge appt to today because child graduating tomorrow morning. Adv will r/s w/ AJ today-pt understood

## 2013-11-01 NOTE — Telephone Encounter (Signed)
pt's 6/4 lb.fu was moved to 6/3 per pt request. per Devoria Albe cx f/u for today pt needs only lab and will f/u as scheduled 6/11. s/w pt she is aware she will only have lab today and to f/u as scheduled next wk. 6/3 f/u cxd.

## 2013-11-02 ENCOUNTER — Other Ambulatory Visit: Payer: BC Managed Care – PPO

## 2013-11-02 ENCOUNTER — Ambulatory Visit: Payer: BC Managed Care – PPO

## 2013-11-02 ENCOUNTER — Ambulatory Visit: Payer: BC Managed Care – PPO | Admitting: Physician Assistant

## 2013-11-08 ENCOUNTER — Other Ambulatory Visit: Payer: Self-pay | Admitting: *Deleted

## 2013-11-08 DIAGNOSIS — C50312 Malignant neoplasm of lower-inner quadrant of left female breast: Secondary | ICD-10-CM

## 2013-11-09 ENCOUNTER — Telehealth: Payer: Self-pay | Admitting: Oncology

## 2013-11-09 ENCOUNTER — Ambulatory Visit (HOSPITAL_BASED_OUTPATIENT_CLINIC_OR_DEPARTMENT_OTHER): Payer: BC Managed Care – PPO

## 2013-11-09 ENCOUNTER — Other Ambulatory Visit: Payer: BC Managed Care – PPO

## 2013-11-09 ENCOUNTER — Other Ambulatory Visit (HOSPITAL_BASED_OUTPATIENT_CLINIC_OR_DEPARTMENT_OTHER): Payer: BC Managed Care – PPO

## 2013-11-09 ENCOUNTER — Encounter: Payer: Self-pay | Admitting: *Deleted

## 2013-11-09 ENCOUNTER — Ambulatory Visit: Payer: BC Managed Care – PPO

## 2013-11-09 ENCOUNTER — Ambulatory Visit (HOSPITAL_BASED_OUTPATIENT_CLINIC_OR_DEPARTMENT_OTHER): Payer: BC Managed Care – PPO | Admitting: Oncology

## 2013-11-09 VITALS — BP 130/60 | HR 90 | Temp 98.9°F | Resp 18 | Ht 64.5 in | Wt 201.8 lb

## 2013-11-09 DIAGNOSIS — C50312 Malignant neoplasm of lower-inner quadrant of left female breast: Secondary | ICD-10-CM

## 2013-11-09 DIAGNOSIS — C773 Secondary and unspecified malignant neoplasm of axilla and upper limb lymph nodes: Secondary | ICD-10-CM

## 2013-11-09 DIAGNOSIS — C50319 Malignant neoplasm of lower-inner quadrant of unspecified female breast: Secondary | ICD-10-CM

## 2013-11-09 DIAGNOSIS — Z170 Estrogen receptor positive status [ER+]: Secondary | ICD-10-CM

## 2013-11-09 DIAGNOSIS — Z5112 Encounter for antineoplastic immunotherapy: Secondary | ICD-10-CM

## 2013-11-09 LAB — CBC WITH DIFFERENTIAL/PLATELET
BASO%: 1 % (ref 0.0–2.0)
Basophils Absolute: 0.2 10*3/uL — ABNORMAL HIGH (ref 0.0–0.1)
EOS%: 2.5 % (ref 0.0–7.0)
Eosinophils Absolute: 0.5 10*3/uL (ref 0.0–0.5)
HCT: 30.1 % — ABNORMAL LOW (ref 34.8–46.6)
HGB: 9.6 g/dL — ABNORMAL LOW (ref 11.6–15.9)
LYMPH%: 8.2 % — ABNORMAL LOW (ref 14.0–49.7)
MCH: 28.4 pg (ref 25.1–34.0)
MCHC: 31.9 g/dL (ref 31.5–36.0)
MCV: 89.1 fL (ref 79.5–101.0)
MONO#: 1.1 10*3/uL — ABNORMAL HIGH (ref 0.1–0.9)
MONO%: 5.8 % (ref 0.0–14.0)
NEUT#: 15.6 10*3/uL — ABNORMAL HIGH (ref 1.5–6.5)
NEUT%: 82.5 % — ABNORMAL HIGH (ref 38.4–76.8)
Platelets: 192 10*3/uL (ref 145–400)
RBC: 3.38 10*6/uL — ABNORMAL LOW (ref 3.70–5.45)
RDW: 18.5 % — ABNORMAL HIGH (ref 11.2–14.5)
WBC: 18.9 10*3/uL — ABNORMAL HIGH (ref 3.9–10.3)
lymph#: 1.6 10*3/uL (ref 0.9–3.3)
nRBC: 2 % — ABNORMAL HIGH (ref 0–0)

## 2013-11-09 LAB — COMPREHENSIVE METABOLIC PANEL (CC13)
ALT: 63 U/L — ABNORMAL HIGH (ref 0–55)
AST: 34 U/L (ref 5–34)
Albumin: 3.9 g/dL (ref 3.5–5.0)
Alkaline Phosphatase: 126 U/L (ref 40–150)
Anion Gap: 12 mEq/L — ABNORMAL HIGH (ref 3–11)
BUN: 5.8 mg/dL — ABNORMAL LOW (ref 7.0–26.0)
CO2: 22 mEq/L (ref 22–29)
Calcium: 9.3 mg/dL (ref 8.4–10.4)
Chloride: 108 mEq/L (ref 98–109)
Creatinine: 0.8 mg/dL (ref 0.6–1.1)
Glucose: 195 mg/dl — ABNORMAL HIGH (ref 70–140)
Potassium: 4.2 mEq/L (ref 3.5–5.1)
Sodium: 142 mEq/L (ref 136–145)
Total Bilirubin: 0.48 mg/dL (ref 0.20–1.20)
Total Protein: 6.8 g/dL (ref 6.4–8.3)

## 2013-11-09 MED ORDER — FAMOTIDINE IN NACL 20-0.9 MG/50ML-% IV SOLN
INTRAVENOUS | Status: AC
  Filled 2013-11-09: qty 50

## 2013-11-09 MED ORDER — DIPHENHYDRAMINE HCL 50 MG/ML IJ SOLN
25.0000 mg | Freq: Once | INTRAMUSCULAR | Status: AC
  Administered 2013-11-09: 25 mg via INTRAVENOUS

## 2013-11-09 MED ORDER — SODIUM CHLORIDE 0.9 % IV SOLN
Freq: Once | INTRAVENOUS | Status: AC
  Administered 2013-11-09: 13:00:00 via INTRAVENOUS

## 2013-11-09 MED ORDER — SODIUM CHLORIDE 0.9 % IJ SOLN
10.0000 mL | INTRAMUSCULAR | Status: DC | PRN
  Administered 2013-11-09: 10 mL
  Filled 2013-11-09: qty 10

## 2013-11-09 MED ORDER — HEPARIN SOD (PORK) LOCK FLUSH 100 UNIT/ML IV SOLN
500.0000 [IU] | Freq: Once | INTRAVENOUS | Status: AC | PRN
  Administered 2013-11-09: 500 [IU]
  Filled 2013-11-09: qty 5

## 2013-11-09 MED ORDER — PACLITAXEL CHEMO INJECTION 300 MG/50ML
175.0000 mg/m2 | Freq: Once | INTRAVENOUS | Status: AC
  Administered 2013-11-09: 360 mg via INTRAVENOUS
  Filled 2013-11-09: qty 60

## 2013-11-09 MED ORDER — DEXAMETHASONE SODIUM PHOSPHATE 20 MG/5ML IJ SOLN
INTRAMUSCULAR | Status: AC
  Filled 2013-11-09: qty 5

## 2013-11-09 MED ORDER — FAMOTIDINE IN NACL 20-0.9 MG/50ML-% IV SOLN
20.0000 mg | Freq: Once | INTRAVENOUS | Status: AC
  Administered 2013-11-09: 20 mg via INTRAVENOUS

## 2013-11-09 MED ORDER — ONDANSETRON 8 MG/NS 50 ML IVPB
INTRAVENOUS | Status: AC
  Filled 2013-11-09: qty 8

## 2013-11-09 MED ORDER — DIPHENHYDRAMINE HCL 50 MG/ML IJ SOLN
INTRAMUSCULAR | Status: AC
  Filled 2013-11-09: qty 1

## 2013-11-09 MED ORDER — ONDANSETRON 8 MG/50ML IVPB (CHCC)
8.0000 mg | Freq: Once | INTRAVENOUS | Status: AC
  Administered 2013-11-09: 8 mg via INTRAVENOUS

## 2013-11-09 MED ORDER — DEXAMETHASONE SODIUM PHOSPHATE 20 MG/5ML IJ SOLN
20.0000 mg | Freq: Once | INTRAMUSCULAR | Status: AC
  Administered 2013-11-09: 20 mg via INTRAVENOUS

## 2013-11-09 NOTE — Patient Instructions (Signed)
[facility] Discharge Instructions for Patients Receiving Chemotherapy  Today you received the following chemotherapy agents taxol  To help prevent nausea and vomiting after your treatment, we encourage you to take your nausea medication as directed   If you develop nausea and vomiting that is not controlled by your nausea medication, call the clinic.   BELOW ARE SYMPTOMS THAT SHOULD BE REPORTED IMMEDIATELY:  *FEVER GREATER THAN 100.5 F  *CHILLS WITH OR WITHOUT FEVER  NAUSEA AND VOMITING THAT IS NOT CONTROLLED WITH YOUR NAUSEA MEDICATION  *UNUSUAL SHORTNESS OF BREATH  *UNUSUAL BRUISING OR BLEEDING  TENDERNESS IN MOUTH AND THROAT WITH OR WITHOUT PRESENCE OF ULCERS  *URINARY PROBLEMS  *BOWEL PROBLEMS  UNUSUAL RASH Items with * indicate a potential emergency and should be followed up as soon as possible.  Feel free to call the clinic you have any questions or concerns. The clinic phone number is [phone].  

## 2013-11-09 NOTE — [facility] Oncology Navigator Note (Signed)
Patient at [facility] for last chemotherapy treatment.  She reports that she feels well and is excited about completing chemo.  She has decided to have surgery at [facility] and is scheduled for 12/13/13.  Patient will return to [facility] for radiation therapy. We discussed what to expect with radiation therapy treatments.  She shared that she has some financial concerns and I discussed with the financial advocate and referred patient and her husband to discuss possible resources.  Patient denied any other questions or concerns at this time.  I encouraged her to call me for any needs.

## 2013-11-09 NOTE — Progress Notes (Signed)
[place]  [phone] [phone]     ID: Debbie [name] OB: Aug 09, 1965  MR#: [id number]  IZT#:[id number]  PCP: Debbie Schwalbe, MD GYN:  Debbie [name] SU: Debbie [name]; [name] OTHER MD: Debbie [name], [place]  CHIEF COMPLAINT: locally advanced breast cancer CURRENT TREATMENT: neoadjuvant chemotherapy  BREAST CANCER HISTORY: As per previously documented note:  Debbie [name] had routine yearly screening mammography at the breast center 06/19/2013 showing a possible mass in the left breast. The right breast was unremarkable. On 06/30/2013 additional views of the left breast showed an irregular spiculated dense mass measuring 2.2 cm in the lower inner quadrant. This was palpable at the 8:00 location. Ultrasound showed an irregular hypoechoic mass measuring 2.3 cm and in the left axilla a dominant abnormal appearing lymph node with cortical thickening measuring 1.2 cm.  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  [id number] the patient underwent bilateral breast MRI. This showed her breasts to be dense (composition C.). In the left breast at the 8:00 position there was a 2.4 cm enhancing mass containing a biopsy clip. There were no additional areas of concern. In the axilla on the left there was a 2.6 cm enlarged left axillary lymph node. There was also a 0.5 cm left internal mammary lymph nodes noted. The right side was unremarkable.  The patient's subsequent history is as detailed below.  INTERVAL HISTORY: Debbie [name] returns today for followup of her breast  cancer. Today is day 1 cycle 4 of f 4 planned cycles of every 2 week paclitaxel. She receives Neulasta on day 2. Debbie [name] is completing her neoadjuvant chemotherapy today   REVIEW OF SYSTEMS: She has tolerated treatment well and in particular she has developed absolutely no peripheral neuropathy symptoms. She is mildly fatigued. She stopped having periods with her treatment and is having some hot flashes. Her port has functioned well. Otherwise a detailed review of systems today was noncontributory   PAST MEDICAL HISTORY: Past Medical History  Diagnosis Date  . MVP (mitral valve prolapse)   . IBS (irritable bowel syndrome)   . Migraines     menstral  . Hyperlipidemia   . MVP (mitral valve prolapse)   . Migraines   . IBS (irritable bowel syndrome)   . Abnormal Pap smear   . Seasonal allergies   . Anxiety   . Menorrhagia   . Breast cancer 2015    ER+/PR+/Her2-    PAST SURGICAL HISTORY: Past Surgical History  Procedure Laterality Date  . Wisdom teeth removal    . Vulva /perineum biopsy  2011  . Svd       x 3  . Endometrial ablation    . Portacath placement Left 07/18/2013    Procedure: INSERTION PORT-A-CATH;  Surgeon: Debbie Klein, MD;  Location: [facility] OR;  Service: General;  Laterality: Left;    FAMILY HISTORY Family History  Problem Relation Age of Onset  . Cancer Maternal Grandmother     BREAST AND UTERINE CANCER  . Uterine cancer Maternal Grandmother 84  . Hyperlipidemia Mother   . Hypertension Mother   . Multiple myeloma Father  39  . Lung cancer Maternal Grandfather     welder  . Testicular cancer Other 38  The patient's father is living, currently age 38. He has a history of multiple myeloma. The patient's mother is 66. The patient's parents live in [place] The patient had no brothers, 2 sisters. There is no history of breast or ovarian cancer in the family. She believes one of her grandfathers died from lung cancer and another from pancreatic cancer, but is not  sure  GYNECOLOGIC HISTORY:  Menarche age 32, first live birth age 61, which the patient understands is an independent risk factor for breast cancer. She is GX P3. She still having regular periods. 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 Her husband Debbie [name] is a Energy manager at [employer]. Her 3 children are Debbie [name] (14), Debbie [name] (10) and Debbie [name] (7). The patient attends a local [employer]: Not in place   HEALTH MAINTENANCE: History  Substance Use Topics  . Smoking status: Never Smoker   . Smokeless tobacco: Never Used  . Alcohol Use: No     Colonoscopy: January 2015  KYH:CWCBJSEG 2014  Bone density:  Lipid panel:  Allergies  Allergen Reactions  . Dust Mite Extract Itching    Current Outpatient Prescriptions  Medication Sig Dispense Refill  . ALPRAZolam (XANAX) 0.5 MG tablet Take 1 tablet (0.5 mg total) by mouth 2 (two) times daily as needed for anxiety.  10 tablet  0  . atorvastatin (LIPITOR) 20 MG tablet Take 20 mg by mouth daily.      . cetirizine (ZYRTEC) 10 MG tablet Take 10 mg by mouth daily.      . FROVA 2.5 MG tablet Take 2.5 mg by mouth daily as needed for migraine.       . gabapentin (NEURONTIN) 300 MG capsule Take 1 capsule (300 mg total) by mouth at bedtime.  30 capsule  6  . lidocaine-prilocaine (EMLA) cream Apply 1 application topically as needed. Apply over port site 1-2 hours before chemotherapy and cover with plastic wrap  30 g  0  . LORazepam (ATIVAN) 0.5 MG tablet Take 1 tablet (0.5 mg total) by mouth at bedtime as needed (Nausea or vomiting).  30 tablet  0  . mometasone (NASONEX) 50 MCG/ACT nasal spray Place 2 sprays into the nose daily as needed (for allergies).       Marland Kitchen omeprazole (PRILOSEC) 20 MG capsule Take 1 capsule (20 mg total) by mouth 2 (two) times daily before a meal.  60 capsule  3  . oxyCODONE-acetaminophen  (PERCOCET/ROXICET) 5-325 MG per tablet Take 1 tablet by mouth every 8 (eight) hours as needed for moderate pain or severe pain.  30 tablet  0  . oxymetazoline (AFRIN) 0.05 % nasal spray Place 2 sprays into the nose daily as needed. congestion      . PARoxetine (PAXIL) 20 MG tablet Take 20 mg by mouth every morning.      . prochlorperazine (COMPAZINE) 10 MG tablet Take 1 tablet (10 mg total) by mouth every 6 (six) hours as needed for nausea or vomiting.  30 tablet  0  . RELPAX 40 MG tablet Take 40 mg by mouth every 2 (two) hours as needed for migraine.       . rizatriptan (MAXALT-MLT) 10 MG disintegrating tablet       . traMADol (ULTRAM) 50  MG tablet Take 1 tablet (50 mg total) by mouth every 6 (six) hours as needed.  30 tablet  0  . Vitamin D, Ergocalciferol, (DRISDOL) 50000 UNITS CAPS capsule Take 50,000 Units by mouth every 7 (seven) days.        No current facility-administered medications for this visit.    OBJECTIVE: Middle-aged white woman who appears stated age 89 Vitals:   11/09/13 1124  BP: 130/60  Pulse: 90  Temp: 98.9 F (37.2 C)  Resp: 18     Body mass index is 34.12 kg/(m^2).    ECOG FS:1 - Symptomatic but completely ambulatory  Sclerae unicteric, pupils round and equal Oropharynx clear and moist No cervical or supraclavicular adenopathy Lungs no rales or rhonchi Heart regular rate and rhythm Abd soft, obese, nontender, positive bowel sounds MSK no focal spinal tenderness, no upper extremity lymphedema Neuro: nonfocal, well oriented, positive affect Breasts: The left breast mass is no longer palpable. The left axilla is benign. The right breast is unremarkable  LAB RESULTS:  CMP     Component Value Date/Time   NA 142 11/09/2013 1054   K 4.2 11/09/2013 1054   CO2 22 11/09/2013 1054   GLUCOSE 195* 11/09/2013 1054   BUN 5.8* 11/09/2013 1054   CREATININE 0.8 11/09/2013 1054   CALCIUM 9.3 11/09/2013 1054   PROT 6.8 11/09/2013 1054   ALBUMIN 3.9 11/09/2013 1054   AST 34  11/09/2013 1054   ALT 63* 11/09/2013 1054   ALKPHOS 126 11/09/2013 1054   BILITOT 0.48 11/09/2013 1054    I No results found for this basename: SPEP,  UPEP,   kappa and lambda light chains    Lab Results  Component Value Date   WBC 18.9* 11/09/2013   NEUTROABS 15.6* 11/09/2013   HGB 9.6* 11/09/2013   HCT 30.1* 11/09/2013   MCV 89.1 11/09/2013   PLT 192 11/09/2013      Chemistry      Component Value Date/Time   NA 142 11/09/2013 1054   K 4.2 11/09/2013 1054   CO2 22 11/09/2013 1054   BUN 5.8* 11/09/2013 1054   CREATININE 0.8 11/09/2013 1054      Component Value Date/Time   CALCIUM 9.3 11/09/2013 1054   ALKPHOS 126 11/09/2013 1054   AST 34 11/09/2013 1054   ALT 63* 11/09/2013 1054   BILITOT 0.48 11/09/2013 1054       No results found for this basename: LABCA2    No components found with this basename: LABCA125    No results found for this basename: INR,  in the last 168 hours  Urinalysis    Component Value Date/Time   BILIRUBINUR Negative 09/22/2011 1451   PROTEINUR Trace 09/22/2011 1451   UROBILINOGEN negative 09/22/2011 1451   NITRITE Negative 09/22/2011 1451   LEUKOCYTESUR Trace 09/22/2011 1451    STUDIES: No results found.  ASSESSMENT: 48 y.o. [place] woman status post left breast and left axillary lymph node biopsy 06/30/2013 for a clinical T2 N1-2, stage IIB/IIIA invasive ductal carcinoma, grade 2, estrogen and progesterone receptors both strongly positive, HER-2 nonamplified, with an MIB-1 of 36%.  (1) there is a 5 mm left internal mammary lymph node which was negative on PET scan  (2) cyclophosphamide and doxorubicin started 08/04/2014, given in dose dense fashion x4 with Neulasta day 2; completed 09/14/2013, followed by paclitaxel again given in dose dense fashion x4, completed 11/09/2013  (3) left lumpectomy and sentinel lymph node sampling plus or minus axillary lymph node dissection scheduled [facility]13/2013  [facility]  (4) adjuvant radiation therapy to follow  surgery; the patient requests a second opinion from Dr. Pablo Ledger  (5) adjuvant antiestrogen therapy to follow radiation  PLAN: jaisha completes her neoadjuvant treatment today, with no dose reductions or delay his. She has had no significant implications and in particular no peripheral edema.  She is scheduled for surgery at [facility] mid-July. I do not need to report any further so it can be removed during the same procedure. We're obtaining a repeat breast MRI July 7 in preparation for her surgery.  She would like a second opinion regarding radiation and will be meeting with Dr. Pablo Ledger the first week in August. She will see me that same week to review the pathology from her surgery. She will then likely see me again in October and at that time we will initiate long-term followup.  The patient has a good understanding of the overall plan. She agrees with it. She knows the goal of treatment in her case is cure. She will call with any problems that may develop before her next visit here.     11/09/2013 11:37 AM

## 2013-11-09 NOTE — Telephone Encounter (Signed)
, °

## 2013-11-09 NOTE — Addendum Note (Signed)
Addended by: Laureen Abrahams on: 11/09/2013 05:44 PM   Modules accepted: Medications

## 2013-11-10 ENCOUNTER — Ambulatory Visit (HOSPITAL_BASED_OUTPATIENT_CLINIC_OR_DEPARTMENT_OTHER): Payer: BC Managed Care – PPO

## 2013-11-10 VITALS — BP 131/62 | HR 89 | Temp 98.6°F

## 2013-11-10 DIAGNOSIS — Z5189 Encounter for other specified aftercare: Secondary | ICD-10-CM

## 2013-11-10 DIAGNOSIS — C50312 Malignant neoplasm of lower-inner quadrant of left female breast: Secondary | ICD-10-CM

## 2013-11-10 DIAGNOSIS — C50319 Malignant neoplasm of lower-inner quadrant of unspecified female breast: Secondary | ICD-10-CM

## 2013-11-10 MED ORDER — PEGFILGRASTIM INJECTION 6 MG/0.6ML
6.0000 mg | Freq: Once | SUBCUTANEOUS | Status: AC
  Administered 2013-11-10: 6 mg via SUBCUTANEOUS
  Filled 2013-11-10: qty 0.6

## 2013-11-16 ENCOUNTER — Other Ambulatory Visit (HOSPITAL_BASED_OUTPATIENT_CLINIC_OR_DEPARTMENT_OTHER): Payer: BC Managed Care – PPO

## 2013-11-16 ENCOUNTER — Encounter: Payer: Self-pay | Admitting: Oncology

## 2013-11-16 DIAGNOSIS — C50312 Malignant neoplasm of lower-inner quadrant of left female breast: Secondary | ICD-10-CM

## 2013-11-16 DIAGNOSIS — C50319 Malignant neoplasm of lower-inner quadrant of unspecified female breast: Secondary | ICD-10-CM

## 2013-11-16 LAB — COMPREHENSIVE METABOLIC PANEL (CC13)
ALT: 69 U/L — ABNORMAL HIGH (ref 0–55)
AST: 35 U/L — ABNORMAL HIGH (ref 5–34)
Albumin: 4.2 g/dL (ref 3.5–5.0)
Alkaline Phosphatase: 190 U/L — ABNORMAL HIGH (ref 40–150)
Anion Gap: 11 mEq/L (ref 3–11)
BUN: 7.6 mg/dL (ref 7.0–26.0)
CO2: 23 mEq/L (ref 22–29)
Calcium: 9.7 mg/dL (ref 8.4–10.4)
Chloride: 107 mEq/L (ref 98–109)
Creatinine: 0.8 mg/dL (ref 0.6–1.1)
Glucose: 150 mg/dl — ABNORMAL HIGH (ref 70–140)
Potassium: 3.9 mEq/L (ref 3.5–5.1)
Sodium: 141 mEq/L (ref 136–145)
Total Bilirubin: 0.47 mg/dL (ref 0.20–1.20)
Total Protein: 7.1 g/dL (ref 6.4–8.3)

## 2013-11-16 LAB — CBC WITH DIFFERENTIAL/PLATELET
BASO%: 0.5 % (ref 0.0–2.0)
Basophils Absolute: 0.2 10*3/uL — ABNORMAL HIGH (ref 0.0–0.1)
EOS%: 1.4 % (ref 0.0–7.0)
Eosinophils Absolute: 0.5 10*3/uL (ref 0.0–0.5)
HCT: 28.5 % — ABNORMAL LOW (ref 34.8–46.6)
HGB: 9.2 g/dL — ABNORMAL LOW (ref 11.6–15.9)
LYMPH%: 4.6 % — ABNORMAL LOW (ref 14.0–49.7)
MCH: 27.9 pg (ref 25.1–34.0)
MCHC: 32.2 g/dL (ref 31.5–36.0)
MCV: 86.6 fL (ref 79.5–101.0)
MONO#: 2.3 10*3/uL — ABNORMAL HIGH (ref 0.1–0.9)
MONO%: 6.2 % (ref 0.0–14.0)
NEUT#: 32.1 10*3/uL — ABNORMAL HIGH (ref 1.5–6.5)
NEUT%: 87.3 % — ABNORMAL HIGH (ref 38.4–76.8)
Platelets: 211 10*3/uL (ref 145–400)
RBC: 3.29 10*6/uL — ABNORMAL LOW (ref 3.70–5.45)
RDW: 17.9 % — ABNORMAL HIGH (ref 11.2–14.5)
WBC: 36.8 10*3/uL — ABNORMAL HIGH (ref 3.9–10.3)
lymph#: 1.7 10*3/uL (ref 0.9–3.3)

## 2013-11-16 NOTE — Progress Notes (Signed)
Pt is approved for $1000 [employer] grant. °

## 2013-11-21 NOTE — Progress Notes (Signed)
Location of Breast Cancer: lower-inner quadrant of left female breast  Histology per Pathology Report:   06/30/13 Diagnosis 1. 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 2. Lymph node, needle/core biopsy, left axilla - INVASIVE MAMMARY CARCINOMA WITH LYMPHOID INFLAMMATION, SEE COMMENT.  Receptor Status: ER(100% positive), PR (100% positive), Her2-neu (negative)  Did patient present with symptoms (if so, please note symptoms) or was this found on screening mammography?: screening mammography  Past/Anticipated interventions by surgeon, if any: biopsy of mass and left axillary lymph node 06/30/13.  Left lumpectomy and sentinel lymph node sampling plus or minus axillary lymph node dissection scheduled for 12/13/2013 at [facility] with Dr.Greenup.    Past/Anticipated interventions by medical oncology, if any: Per Dr. Jana Hakim - " cyclophosphamide and doxorubicin started 08/04/2014, given in dose dense fashion x4 with Neulasta day 2; completed 09/14/2013, followed by paclitaxel again given in dose dense fashion x4, completed 11/09/2013."   Lymphedema issues, if any:No  Pain issues, if any:No  SAFETY ISSUES:  Prior radiation? no  Pacemaker/ICD? No  Possible current pregnancy?no.patient had endometrial ablation and husband had vasectomy.  Is the patient on methotrexate? no  Current Complaints / other details: Married.Menarche age 17, first live birth age 89, which the patient understands is an independent risk factor for breast cancer. She is GX P3. She still having regular periods., last March 6,2015.Her migraines tend to occur right around the time of her menses   Patient saw Dr. Lisbeth Renshaw in the breast clinic on 07/12/13. Children ages 57,11 and 42.       Jacqulyn Liner, RN 11/21/2013,2:15 PM

## 2013-11-23 ENCOUNTER — Ambulatory Visit
Admission: RE | Admit: 2013-11-23 | Discharge: 2013-11-23 | Disposition: A | Discharge status: Home / Self Care | Payer: BC Managed Care – PPO | Source: Ambulatory Visit | Attending: Radiation Oncology | Admitting: Radiation Oncology

## 2013-11-23 ENCOUNTER — Encounter: Payer: Self-pay | Admitting: *Deleted

## 2013-11-23 ENCOUNTER — Encounter: Payer: Self-pay | Admitting: Radiation Oncology

## 2013-11-23 VITALS — BP 123/70 | HR 90 | Temp 98.2°F | Wt 199.1 lb

## 2013-11-23 DIAGNOSIS — Z9221 Personal history of antineoplastic chemotherapy: Secondary | ICD-10-CM | POA: Diagnosis not present

## 2013-11-23 DIAGNOSIS — Z170 Estrogen receptor positive status [ER+]: Secondary | ICD-10-CM | POA: Diagnosis not present

## 2013-11-23 DIAGNOSIS — C50312 Malignant neoplasm of lower-inner quadrant of left female breast: Secondary | ICD-10-CM

## 2013-11-23 DIAGNOSIS — Z510 Encounter for antineoplastic radiation therapy: Secondary | ICD-10-CM | POA: Diagnosis present

## 2013-11-23 DIAGNOSIS — C50919 Malignant neoplasm of unspecified site of unspecified female breast: Secondary | ICD-10-CM | POA: Diagnosis not present

## 2013-11-23 NOTE — [facility] Oncology Navigator Note (Signed)
Patient at [facility] for radiation therapy consult with Dr. Pablo Ledger.  Patient reports that she is doing well.  She is still experiencing some fatigue but denies any other symptoms at this time.  She is scheduled for surgery at [facility] on 12/13/13.  Patient reports that she is a little anxious since she has not had surgery before but denies any questions today.  I told her about the [employer] mentors and told her to call me if she would like to talk to someone whose cancer and treatment was similar to hers and I will make a referral.  We also discussed that she will probably begin radiation after her surgery is healed in approximately 4 - 6 weeks and I explained the simulation process and tattoos.  I encouraged her to call me for any questions, concerns or needs she may have.

## 2013-11-23 NOTE — Progress Notes (Signed)
Please see the Nurse Progress Note in the MD Initial Consult Encounter for this patient. 

## 2013-11-24 ENCOUNTER — Encounter: Payer: Self-pay | Admitting: Radiation Oncology

## 2013-11-24 NOTE — Addendum Note (Signed)
Encounter addended by: Thea Silversmith, MD on: 11/24/2013  9:56 AM<BR>     Documentation filed: Notes Section

## 2013-11-24 NOTE — Progress Notes (Addendum)
Department of Radiation Oncology  Phone:  [phone] Fax:        [phone]   Name: Debbie [name] MRN: [id number]  DOB: 06/16/1965  Date: 11/23/2013  Follow Up Visit Note  Diagnosis: T2N1 Invasive Ductal Carcinoma of the Left breast  Interval History: Debbie [name] presents today for establishing care on my service. She had been initially seen by my partner Dr. Lisbeth Renshaw but I treated a friend of hers and she wished to be seen by me.  She underwent screening mammogram on 06/19/13 which showed a mass in the left breast.  Ultrasound confirmed an irregular hypoechoic mass measuring 2.3 cm with an abnormal lymph node in the left axilla measuring 1.2 cm. Biopsy of the breast and axilla showed invasive ductal carcinoma Grade 2 with Er+ 100% and Pr 100% positive. Her2 was negative. A bilateral breast MRI showed dense breasts with a 2.4 cm enhancing mass and a 2.6 cm left axiallry lymph node a 0.5 cm internal mammary node was also noted. A PET scan on 07/15/13 showed the left breast was hypermetabolic with an SUV of 69.4. The left axillary lymph node was also hypermetabolic with an suv of 8.6. The internal mammary lymph node was not hypermetabolic. She elected for neoadjuvant chemotherapy with an attempt at breast conservation. She has done will with this and had her last cycle on 6/11. An interim MRI on 5/11 showed a good response to treatment with the mass in the left breast now measuring 1.3 cm and the lymph node down to 1.1 cm. The IM node was no longer visible. Her surgery is scheduled for July 15th. She came today for my opinion regarding radiation in the post operative setting. As I understand it, she is scheduled for lumpectomy and sentinel lymph node biopsy/targeted removal of the positive lymph node in her axilla. She has had some fatigue and of course hair loss but not much in the way of neuropathy.   Allergies:  Allergies  Allergen Reactions  . Dust Mite Extract Itching    Medications:  Current  Outpatient Prescriptions  Medication Sig Dispense Refill  . ALPRAZolam (XANAX) 0.5 MG tablet Take 1 tablet (0.5 mg total) by mouth 2 (two) times daily as needed for anxiety.  10 tablet  0  . atorvastatin (LIPITOR) 20 MG tablet Take 20 mg by mouth daily.      . cetirizine (ZYRTEC) 10 MG tablet Take 10 mg by mouth daily.      . FROVA 2.5 MG tablet Take 2.5 mg by mouth daily as needed for migraine.       . lidocaine-prilocaine (EMLA) cream Apply 1 application topically as needed. Apply over port site 1-2 hours before chemotherapy and cover with plastic wrap  30 g  0  . mometasone (NASONEX) 50 MCG/ACT nasal spray Place 2 sprays into the nose daily as needed (for allergies).       Marland Kitchen oxymetazoline (AFRIN) 0.05 % nasal spray Place 2 sprays into the nose daily as needed. congestion      . PARoxetine (PAXIL) 20 MG tablet Take 20 mg by mouth every morning.      Marland Kitchen RELPAX 40 MG tablet Take 40 mg by mouth every 2 (two) hours as needed for migraine.       . rizatriptan (MAXALT-MLT) 10 MG disintegrating tablet       . Vitamin D, Ergocalciferol, (DRISDOL) 50000 UNITS CAPS capsule Take 50,000 Units by mouth every 7 (seven) days.       Marland Kitchen gabapentin (  NEURONTIN) 300 MG capsule Take 1 capsule (300 mg total) by mouth at bedtime.  30 capsule  6  . LORazepam (ATIVAN) 0.5 MG tablet Take 1 tablet (0.5 mg total) by mouth at bedtime as needed (Nausea or vomiting).  30 tablet  0  . omeprazole (PRILOSEC) 20 MG capsule Take 1 capsule (20 mg total) by mouth 2 (two) times daily before a meal.  60 capsule  3  . oxyCODONE-acetaminophen (PERCOCET/ROXICET) 5-325 MG per tablet Take 1 tablet by mouth every 8 (eight) hours as needed for moderate pain or severe pain.  30 tablet  0  . prochlorperazine (COMPAZINE) 10 MG tablet Take 1 tablet (10 mg total) by mouth every 6 (six) hours as needed for nausea or vomiting.  30 tablet  0  . traMADol (ULTRAM) 50 MG tablet Take 1 tablet (50 mg total) by mouth every 6 (six) hours as needed.  30 tablet   0   No current facility-administered medications for this encounter.    Physical Exam:  Filed Vitals:   11/23/13 0917  BP: 123/70  Pulse: 90  Temp: 98.2 F (36.8 C)  Weight: 199 lb 1.6 oz (90.311 kg)  SpO2: 99%   Pleasant female in no distress. Appropriate. Alert adn orinted. No palpable abnormalities of the left or right breast. No palpable axiallry adenopathy bilaterally.   IMPRESSION: Debbie [name] is a 48 y.o. female with initially locally advanced disease and a good response to neoadjuvant chemotherapy now considering breast conservation  PLAN:  I spoke to the patient today regarding her diagnosis and options for treatment.  We discussed the role of radiation in decreasing local failures in patients who undergo lumpectomy. We discussed that standard is still to perform on axillary node dissection on patients with nodes positive up front although this is known not to have a survival benefit and there are several clinical trials looking at omitting this and instead substituting less surgery and targeting the axilla with radiation. She mentioned being on a trial involving this which I will have to clarify with Dr. Carvel Getting. I would also like her input on the internal mammary node and whether that will need to be included in the radiation fields. She was seen by Dr. Katheran James at [facility] as well in radiation oncology.   We discussed radiation would start about a month after her surgery and would likely entail treatment of her breast, supraclavicular lymph nodes and possibly her axilla and internal mammary lymph nodes. We discussed the process of simulation and the placement tattoos. We discussed 6 weeks of treatment as an outpatient. We discussed the possibility of asymptomatic lung damage and the use of breath hold technique for cardiac sparing if necessary. We discussed the low likelihood of secondary malignancies. We discussed the possible side effects including but not limited to skin redness, fatigue,  permanent skin darkening, and breast swelling.   She had questions regarding cardiac follow up and I will direct that to Dr. [name] nurse and she was interested in our [facility] program so I will make a referral for that as well.   Thea Silversmith, MD

## 2013-11-24 NOTE — Progress Notes (Signed)
Discussed case with Dr. Carvel Getting. She believes IM node is reactive and does not need to be treated. She has discussed the [employer] trial with the patient and has facilitated the research nurses looking into her as a candidate. I asked her to keep me informed of patient's progress and investigate if patient could receive radiation at another site.

## 2013-12-04 ENCOUNTER — Ambulatory Visit
Admission: RE | Admit: 2013-12-04 | Discharge: 2013-12-04 | Disposition: A | Discharge status: Home / Self Care | Payer: BC Managed Care – PPO | Source: Ambulatory Visit | Attending: Oncology | Admitting: Oncology

## 2013-12-04 ENCOUNTER — Inpatient Hospital Stay: Admission: RE | Admit: 2013-12-04 | Payer: BC Managed Care – PPO | Source: Ambulatory Visit

## 2013-12-04 DIAGNOSIS — C50312 Malignant neoplasm of lower-inner quadrant of left female breast: Secondary | ICD-10-CM

## 2013-12-04 MED ORDER — GADOBENATE DIMEGLUMINE 529 MG/ML IV SOLN
19.0000 mL | Freq: Once | INTRAVENOUS | Status: AC | PRN
  Administered 2013-12-04: 19 mL via INTRAVENOUS

## 2013-12-12 ENCOUNTER — Encounter: Payer: Self-pay | Admitting: *Deleted

## 2013-12-12 ENCOUNTER — Telehealth: Payer: Self-pay | Admitting: Oncology

## 2013-12-12 NOTE — [facility] Oncology Navigator Note (Signed)
Patient scheduled for surgery at [facility] tomorrow.  I called patient to check in and she reports that she is doing well and does not have any questions or concerns at this time.  I encouraged her to call me if she has any needs.

## 2013-12-12 NOTE — Telephone Encounter (Signed)
Faxed pt medical records to Dr. Carvel Getting

## 2014-01-04 ENCOUNTER — Telehealth: Payer: Self-pay | Admitting: Oncology

## 2014-01-04 ENCOUNTER — Encounter: Payer: Self-pay | Admitting: *Deleted

## 2014-01-04 NOTE — Telephone Encounter (Signed)
per 8/6 pof cx 8/7 appt and r/s to 8/21 w/GM @ 8am - no lab. added appt for 8/21 - appt for 8/7 already cxd. s/w pt she is aware.

## 2014-01-04 NOTE — [facility] Oncology Navigator Note (Signed)
Patient called because she is scheduled to see Dr. Jana Hakim 01/05/14 to discuss her pathology from surgery at [facility].  She informed me that she had to have a re-excision to clear margins 01/03/14 and she wanted to know if she should reschedule her appointment.  I discussed with Dr. Jana Hakim and rescheduled her for 01/19/14.  I notified her of the schedule change.  She reports that she is doing well except for some fatigue.  She continues to take pain medication as needed.  She denies any questions or concerns at this time.  I encouraged her to call me for any needs.

## 2014-01-05 ENCOUNTER — Other Ambulatory Visit: Payer: BC Managed Care – PPO

## 2014-01-05 ENCOUNTER — Ambulatory Visit: Payer: BC Managed Care – PPO | Admitting: Oncology

## 2014-01-17 ENCOUNTER — Encounter: Payer: Self-pay | Admitting: Radiation Oncology

## 2014-01-19 ENCOUNTER — Encounter: Payer: Self-pay | Admitting: *Deleted

## 2014-01-19 ENCOUNTER — Ambulatory Visit (HOSPITAL_BASED_OUTPATIENT_CLINIC_OR_DEPARTMENT_OTHER): Payer: BC Managed Care – PPO | Admitting: Oncology

## 2014-01-19 VITALS — BP 115/75 | HR 82 | Temp 98.3°F | Resp 98 | Ht 64.5 in | Wt 200.4 lb

## 2014-01-19 DIAGNOSIS — Z170 Estrogen receptor positive status [ER+]: Secondary | ICD-10-CM

## 2014-01-19 DIAGNOSIS — C50312 Malignant neoplasm of lower-inner quadrant of left female breast: Secondary | ICD-10-CM

## 2014-01-19 DIAGNOSIS — C50319 Malignant neoplasm of lower-inner quadrant of unspecified female breast: Secondary | ICD-10-CM

## 2014-01-19 DIAGNOSIS — C773 Secondary and unspecified malignant neoplasm of axilla and upper limb lymph nodes: Secondary | ICD-10-CM

## 2014-01-19 NOTE — Progress Notes (Signed)
[place]  [phone] [phone]     ID: Debbie [name] OB: 05-19-1966  MR#: [id number]  UVO#:[id number]  PCP: [name], MD GYN:  [name]: [name]; Kathaleen Grinder OTHER MD: Thea Silversmith, De Nurse, [place]  CHIEF COMPLAINT: locally advanced breast cancer CURRENT TREATMENT:   BREAST CANCER HISTORY: As per previously documented note:  Lynell had routine yearly screening mammography at the breast center 06/19/2013 showing a possible mass in the left breast. The right breast was unremarkable. On 06/30/2013 additional views of the left breast showed an irregular spiculated dense mass measuring 2.2 cm in the lower inner quadrant. This was palpable at the 8:00 location. Ultrasound showed an irregular hypoechoic mass measuring 2.3 cm and in the left axilla a dominant abnormal appearing lymph node with cortical thickening measuring 1.2 cm.  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  [id number] the patient underwent bilateral breast MRI. This showed her breasts to be dense (composition C.). In the left breast at the 8:00 position there was a 2.4 cm enhancing mass containing a biopsy clip. There were no additional areas of concern. In the axilla on the left there was a 2.6 cm enlarged left axillary lymph node. There was also a 0.5 cm left internal mammary lymph nodes noted. The right side was unremarkable.  The patient's subsequent history is as detailed below.  INTERVAL HISTORY: Debbie [name] returns today for followup of her breast cancer. Since her last  visit here she underwent left lumpectomy and sentinel lymph node sampling at [facility] under [employer]. The pathology report showed a 2.5 cm residual invasive ductal carcinoma, grade 3, with some ductal carcinoma in situ. The medial and posterior margins were focally positive for both the invasive and in situ components. A total of 10 lymph nodes, including 4 sentinel lymph nodes, were removed, and all are clear. I do not find that a repeat prognostic panel was obtained. On 01/03/2014 the patient underwent additional surgery to clear the medial and posterior margins. Although I cannot access the full report, the patient tells me that margins indeed were cleared.(SP [id number] and [id number]).   REVIEW OF SYSTEMS: Debbie [name] did well with her definitive lumpectomy. She does have some pain related to the repeat surgery more than the original surgery. As better, and currently the only pain intervention she is using is the occasional ice pack. There has been no fever and no bleeding. Her hair is beginning to grow back. She has blurred vision, secondary to accommodation problems, but no dry eyes or epiphora. Her energy is still not normal, but she is walking about a mile 3 or 4 times a week. She is having moderate hot flashes. She is very concerned regarding her risk of recurrence. A detailed review of systems today was otherwise stable   PAST MEDICAL HISTORY: Past Medical History  Diagnosis Date  . MVP (mitral valve prolapse)   . IBS (irritable bowel syndrome)   . Migraines     menstral  . Hyperlipidemia   . MVP (mitral valve prolapse)   . Migraines   . IBS (irritable bowel syndrome)   .  Abnormal Pap smear   . Seasonal allergies   . Anxiety   . Menorrhagia   . Breast cancer 2015    ER+/PR+/Her2-    PAST SURGICAL HISTORY: Past Surgical History  Procedure Laterality Date  . Wisdom teeth removal    . Vulva /perineum biopsy  2011  . Svd       x 3  . Endometrial ablation    . Portacath placement Left  07/18/2013    Procedure: INSERTION PORT-A-CATH;  Surgeon: [name], MD;  Location: [facility] OR;  Service: General;  Laterality: Left;    FAMILY HISTORY Family History  Problem Relation Age of Onset  . Cancer Maternal Grandmother     BREAST AND UTERINE CANCER  . Uterine cancer Maternal Grandmother 84  . Hyperlipidemia Mother   . Hypertension Mother   . Multiple myeloma Father 80  . Lung cancer Maternal Development worker, international aid  . Testicular cancer Other 23  The patient's father is living, currently age 74. He has a history of multiple myeloma. The patient's mother is 49. The patient's parents live in [place] The patient had no brothers, 2 sisters. There is no history of breast or ovarian cancer in the family. She believes one of her grandfathers died from lung cancer and another from pancreatic cancer, but is not sure  GYNECOLOGIC HISTORY:  Menarche age 29, first live birth age 49, which the patient understands is an independent risk factor for breast cancer. She is GX P3. She was still having regular periods at the start of her chemotherapy. 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 Her husband [name] is a Energy manager at [employer]. Her 3 children are [name] (14), [name] (10) and [name] (7). The patient attends a local [place]: Not in place   HEALTH MAINTENANCE: History  Substance Use Topics  . Smoking status: Never Smoker   . Smokeless tobacco: Never Used  . Alcohol Use: No     Colonoscopy: January 2015  KZS:WFUXNATF 2014  Bone density:  Lipid panel:  Allergies  Allergen Reactions  . Dust Mite Extract Itching    Current Outpatient Prescriptions  Medication Sig Dispense Refill  . ALPRAZolam (XANAX) 0.5 MG tablet Take 1 tablet (0.5 mg total) by mouth 2 (two) times daily as needed for anxiety.  10 tablet  0  . atorvastatin (LIPITOR) 20  MG tablet Take 20 mg by mouth daily.      . cetirizine (ZYRTEC) 10 MG tablet Take 10 mg by mouth daily.      . FROVA 2.5 MG tablet Take 2.5 mg by mouth daily as needed for migraine.       . gabapentin (NEURONTIN) 300 MG capsule Take 1 capsule (300 mg total) by mouth at bedtime.  30 capsule  6  . lidocaine-prilocaine (EMLA) cream Apply 1 application topically as needed. Apply over port site 1-2 hours before chemotherapy and cover with plastic wrap  30 g  0  . LORazepam (ATIVAN) 0.5 MG tablet Take 1 tablet (0.5 mg total) by mouth at bedtime as needed (Nausea or vomiting).  30 tablet  0  . mometasone (NASONEX) 50 MCG/ACT nasal spray Place 2 sprays into the nose daily as needed (for allergies).       Marland Kitchen omeprazole (PRILOSEC) 20 MG capsule Take 1 capsule (20 mg total) by mouth 2 (two)  times daily before a meal.  60 capsule  3  . oxyCODONE-acetaminophen (PERCOCET/ROXICET) 5-325 MG per tablet Take 1 tablet by mouth every 8 (eight) hours as needed for moderate pain or severe pain.  30 tablet  0  . oxymetazoline (AFRIN) 0.05 % nasal spray Place 2 sprays into the nose daily as needed. congestion      . PARoxetine (PAXIL) 20 MG tablet Take 20 mg by mouth every morning.      . prochlorperazine (COMPAZINE) 10 MG tablet Take 1 tablet (10 mg total) by mouth every 6 (six) hours as needed for nausea or vomiting.  30 tablet  0  . RELPAX 40 MG tablet Take 40 mg by mouth every 2 (two) hours as needed for migraine.       . rizatriptan (MAXALT-MLT) 10 MG disintegrating tablet       . traMADol (ULTRAM) 50 MG tablet Take 1 tablet (50 mg total) by mouth every 6 (six) hours as needed.  30 tablet  0  . Vitamin D, Ergocalciferol, (DRISDOL) 50000 UNITS CAPS capsule Take 50,000 Units by mouth every 7 (seven) days.        No current facility-administered medications for this visit.    OBJECTIVE: Middle-aged white woman in no acute distress Filed Vitals:   01/19/14 0817  BP: 115/75  Pulse: 82  Temp: 98.3 F (36.8 C)   Resp: 98     Body mass index is 33.88 kg/(m^2).    ECOG FS:1 - Symptomatic but completely ambulatory  Sclerae unicteric, pupils round and reactive Oropharynx clear and moist, no thrush or other lesions noted No cervical or supraclavicular adenopathy Lungs no rales or rhonchi Heart regular rate and rhythm Abd soft, obese, nontender, positive bowel sounds MSK no focal spinal tenderness, no left upper extremity lymphedema Neuro: nonfocal, well oriented, anxious affect Breasts: The left breast is status post recent lumpectomy and axillary lymph node dissection. The cosmetic result is good. There is no evidence of dehiscence, swelling, or erythema.. The left axilla is benign. The right breast is unremarkable  LAB RESULTS:  CMP     Component Value Date/Time   NA 141 11/16/2013 1335   K 3.9 11/16/2013 1335   CO2 23 11/16/2013 1335   GLUCOSE 150* 11/16/2013 1335   BUN 7.6 11/16/2013 1335   CREATININE 0.8 11/16/2013 1335   CALCIUM 9.7 11/16/2013 1335   PROT 7.1 11/16/2013 1335   ALBUMIN 4.2 11/16/2013 1335   AST 35* 11/16/2013 1335   ALT 69* 11/16/2013 1335   ALKPHOS 190* 11/16/2013 1335   BILITOT 0.47 11/16/2013 1335    I No results found for this basename: SPEP,  UPEP,   kappa and lambda light chains    Lab Results  Component Value Date   WBC 36.8* 11/16/2013   NEUTROABS 32.1* 11/16/2013   HGB 9.2* 11/16/2013   HCT 28.5* 11/16/2013   MCV 86.6 11/16/2013   PLT 211 11/16/2013      Chemistry      Component Value Date/Time   NA 141 11/16/2013 1335   K 3.9 11/16/2013 1335   CO2 23 11/16/2013 1335   BUN 7.6 11/16/2013 1335   CREATININE 0.8 11/16/2013 1335      Component Value Date/Time   CALCIUM 9.7 11/16/2013 1335   ALKPHOS 190* 11/16/2013 1335   AST 35* 11/16/2013 1335   ALT 69* 11/16/2013 1335   BILITOT 0.47 11/16/2013 1335       No results found for this basename: LABCA2    No  components found with this basename: [id number]    No results found for this basename: INR,  in the last  168 hours  Urinalysis    Component Value Date/Time   BILIRUBINUR Negative 09/22/2011 1451   PROTEINUR Trace 09/22/2011 1451   UROBILINOGEN negative 09/22/2011 1451   NITRITE Negative 09/22/2011 1451   LEUKOCYTESUR Trace 09/22/2011 1451    STUDIES: No results found.  ASSESSMENT: 48 y.o. BRCA negative [place] woman status post left breast and left axillary lymph node biopsy 06/30/2013, positive for a clinical T2 N1-2, stage IIB/IIIA invasive ductal carcinoma, grade 2, estrogen and progesterone receptors strongly positive, HER-2 nonamplified, with an MIB-1 of 36%.  (1) a 5 mm left internal mammary lymph node on breast MRI to 11/18/2013 was negative on PET scan 07/17/2013 and was not seen on subsequent breast MRIs 10/09/2013 and 12/04/2013  (2) cyclophosphamide and doxorubicin started 08/04/2014, given in dose dense fashion x4 with Neulasta day 2; completed 09/14/2013, followed by paclitaxel again given in dose dense fashion x4, completed 11/09/2013  (3) left lumpectomy and sentinel lymph node sampling plus or minus axillary lymph node dissection at [facility] on 12/13/2013 showed a residual pT2 pN0 (10 lymph nodes removed) invasive ductal carcinoma, grade II; positive medial and posterior margins were cleared with additional surgery 01/03/2014  (4) adjuvant radiation therapy to follow surgery  (5) adjuvant antiestrogen therapy to follow radiation  PLAN: Debbie [name] was anxious to discuss her risk of this cancer recurring and unfortunately I was not able to find a time to work that out for her during this visit. Going now into the adjuvant! Database, a 48 year old woman presenting with a grade 3 T2 N1 invasive ductal carcinoma, treated with optimal local therapy only, the risk of mortality would be 44%. This decreases by 24% with optimal chemotherapy, which she has received, and optimal antiestrogen therapy, which she will receive. Accordingly her chance of this cancer not recurring will be 80%. We will  discuss that at her request at the next visit with me.  She is now ready to complete local therapy with radiation, and already has an appointment with Dr. Pablo Ledger for [phone]. I expect she will be receiving radiation through most of October, so she will see me again early November at which time we will probably start tamoxifen, the plan being to switch to an aromatase inhibitor after 2 or 5 years.  I am setting tarry up to meet with our dietitian. I also encouraged her to sign up for the "finding your new normal" support group which will meet in late November or December. I encouraged her to continue and intensify her exercise program. The patient has a good understanding of the overall plan, and agrees with it. She knows the goal of treatment in her case is cure. She will call with any problems that may develop before her next visit here.    01/20/2014 10:00 AM

## 2014-01-19 NOTE — [facility] Oncology Navigator Note (Signed)
Patient at [facility] for f/u visit with Dr. Jana Hakim following a lumpectomy and re-excision at [facility].  Patient reports that she is doing well.  She was concerned that her port has not been flushed since 11/09/13.  I spoke with Dr. Jana Hakim who said he would place an order for flush or to remove the port after discussing with patient.  Patient denied any other questions or concerns at this time.  I encouraged her to call me for any needs.

## 2014-01-22 NOTE — Addendum Note (Signed)
Addended by: Renford Dills on: 01/22/2014 11:50 AM   Modules accepted: Medications

## 2014-01-24 ENCOUNTER — Telehealth: Payer: Self-pay | Admitting: Oncology

## 2014-01-24 NOTE — Telephone Encounter (Signed)
per pof to sch pt appt-cld & spoke to pt to adv of pt time & date

## 2014-01-30 ENCOUNTER — Encounter: Payer: BC Managed Care – PPO | Admitting: Nutrition

## 2014-01-31 ENCOUNTER — Ambulatory Visit
Admission: RE | Admit: 2014-01-31 | Discharge: 2014-01-31 | Disposition: A | Discharge status: Home / Self Care | Payer: BC Managed Care – PPO | Source: Ambulatory Visit | Attending: Radiation Oncology | Admitting: Radiation Oncology

## 2014-01-31 ENCOUNTER — Ambulatory Visit (HOSPITAL_BASED_OUTPATIENT_CLINIC_OR_DEPARTMENT_OTHER): Payer: BC Managed Care – PPO

## 2014-01-31 ENCOUNTER — Encounter: Payer: Self-pay | Admitting: *Deleted

## 2014-01-31 VITALS — BP 138/72 | HR 79 | Temp 98.7°F | Wt 201.6 lb

## 2014-01-31 DIAGNOSIS — Z95828 Presence of other vascular implants and grafts: Secondary | ICD-10-CM

## 2014-01-31 DIAGNOSIS — Z510 Encounter for antineoplastic radiation therapy: Secondary | ICD-10-CM | POA: Diagnosis not present

## 2014-01-31 DIAGNOSIS — C773 Secondary and unspecified malignant neoplasm of axilla and upper limb lymph nodes: Secondary | ICD-10-CM

## 2014-01-31 DIAGNOSIS — C50312 Malignant neoplasm of lower-inner quadrant of left female breast: Secondary | ICD-10-CM

## 2014-01-31 DIAGNOSIS — Z452 Encounter for adjustment and management of vascular access device: Secondary | ICD-10-CM

## 2014-01-31 DIAGNOSIS — C50319 Malignant neoplasm of lower-inner quadrant of unspecified female breast: Secondary | ICD-10-CM

## 2014-01-31 MED ORDER — SODIUM CHLORIDE 0.9 % IJ SOLN
10.0000 mL | INTRAMUSCULAR | Status: DC | PRN
  Administered 2014-01-31: 10 mL via INTRAVENOUS
  Filled 2014-01-31: qty 10

## 2014-01-31 MED ORDER — HEPARIN SOD (PORK) LOCK FLUSH 100 UNIT/ML IV SOLN
500.0000 [IU] | Freq: Once | INTRAVENOUS | Status: AC
  Administered 2014-01-31: 500 [IU] via INTRAVENOUS
  Filled 2014-01-31: qty 5

## 2014-01-31 NOTE — [facility] Oncology Navigator Note (Signed)
Patient at [facility] for port flush and Rad Onc Nurse consult.  Patient reports that she is doing well.  She continues to have some fatigue.  We discussed that it takes time to recover from the effects of chemo and surgery and that she is preparing to undergo radiation treatments that may also cause fatigue.  She has returned to work and is able to complete her usual activities.  She is scheduled for a nutrition consult tomorrow and has a conflict and per her request, I requested that the appointment be rescheduled.  We also discussed the [facility] class and she intends to sign up.  Patient denied any other questions or concerns at this time.  I encouraged her to call me for any needs.

## 2014-01-31 NOTE — Progress Notes (Addendum)
Location of Breast Cancer:lower-inner quadrant of left breast.  Histology per Pathology Report:1/30/15Diagnosis 1. 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 2. Lymph node, needle/core biopsy, left axilla - INVASIVE MAMMARY CARCINOMA WITH LYMPHOID INFLAMMATION, SEE COMMENT.   Receptor Status: ER(+), PR (+), Her2-neu (-)  Did patient present with symptoms (if so, please note symptoms) or was this found on screening mammography?:found on screening.  Past/Anticipated interventions by surgeon, if any: 01/03/14:mastectomy partial , re-excision  By Dr.Greenup 12/13/13:Biopsy/excision lymph node axillary by Dr.Greenup 12/13/13:Partial mastectomy left breast. 12/13/13:open excision breast lesion with radiologic marker.  Past/Anticipated interventions by medical oncology, if UYE:[id number] completed 11/09/2013.  Lymphedema issues, if any:no  Pain issues, if any: no  SAFETY ISSUES:  Prior radiation?No  Pacemaker/ICD? No  Possible current pregnancy?No  Is the patient on methotrexate? No     [name], RN 01/31/2014,2:42 PM  Current Complaints / other details: Married.Menarche age 32, first live birth age 71, which the patient understands is an independent risk factor for breast cancer. She is GX P3. She still having regular periods., last March 6,2015.Her migraines tend to occur right around the time of her menses Patient saw Dr. Lisbeth Renshaw in the breast clinic on 07/12/13.  Children ages 63,11 and 46.

## 2014-01-31 NOTE — Progress Notes (Signed)
Department of Radiation Oncology  Phone:  [phone] Fax:        [phone]   Name: Debbie [name] MRN: [id number]  DOB: [birth year]  Date: 01/31/2014  Follow Up Visit Note  Diagnosis: T2N1 Invasive Ductal Carcinoma of the left breast  Interval History: Kelilah presents today for routine followup.  She finished chemo and had her surgery on 7/15.  This showed residual invasive ductal carcinoma measuring 2.5 cm with 2 micrometases out of 10 lymph nodes with extranodal extension. She unfortunately had a margin positive for DCIS which she underwent re-excision on 8/5 with finally clear margins. She has recovered well from surgery. She has gone back to work. She has some numbness over her left breast and arm but no pain or swelling. She is ready to proceed on with radiation.   Allergies:  Allergies  Allergen Reactions  . Dust Mite Extract Itching    Medications:  Current Outpatient Prescriptions  Medication Sig Dispense Refill  . ALPRAZolam (XANAX) 0.5 MG tablet Take 1 tablet (0.5 mg total) by mouth 2 (two) times daily as needed for anxiety.  10 tablet  0  . atorvastatin (LIPITOR) 20 MG tablet Take 20 mg by mouth daily.      . cetirizine (ZYRTEC) 10 MG tablet Take 10 mg by mouth daily.      Marland Kitchen lidocaine-prilocaine (EMLA) cream Apply 1 application topically as needed. Apply over port site 1-2 hours before chemotherapy and cover with plastic wrap  30 g  0  . mometasone (NASONEX) 50 MCG/ACT nasal spray Place 2 sprays into the nose daily as needed (for allergies).       Marland Kitchen omeprazole (PRILOSEC) 20 MG capsule Take 1 capsule (20 mg total) by mouth 2 (two) times daily before a meal.  60 capsule  3  . oxymetazoline (AFRIN) 0.05 % nasal spray Place 2 sprays into the nose daily as needed. congestion      . PARoxetine (PAXIL) 20 MG tablet Take 20 mg by mouth every morning.      Marland Kitchen RELPAX 40 MG tablet Take 40 mg by mouth every 2 (two) hours as needed for migraine.       . Vitamin D, Ergocalciferol,  (DRISDOL) 50000 UNITS CAPS capsule Take 50,000 Units by mouth every 7 (seven) days.       . FROVA 2.5 MG tablet Take 2.5 mg by mouth daily as needed for migraine.       Marland Kitchen LORazepam (ATIVAN) 0.5 MG tablet Take 1 tablet (0.5 mg total) by mouth at bedtime as needed (Nausea or vomiting).  30 tablet  0  . prochlorperazine (COMPAZINE) 10 MG tablet Take 1 tablet (10 mg total) by mouth every 6 (six) hours as needed for nausea or vomiting.  30 tablet  0   No current facility-administered medications for this encounter.    Physical Exam:  Filed Vitals:   01/31/14 1508  BP: 138/72  Pulse: 79  Temp: 98.7 F (37.1 C)  Weight: 201 lb 9.6 oz (91.445 kg)  SpO2: 98%   Pleasant female in no distress. Healing wound on the side of the left breast.   IMPRESSION: Dahiana is a 48 y.o. female s/p lumpectomy and axillary node dissection after neoadjuvant chemotherapy with residual T2N1 cancer.   PLAN:  I spoke to the patient today regarding her diagnosis and options for treatment. We discussed the equivalence in terms of survival and local failure between mastectomy and breast conservation. We discussed the role of radiation in decreasing local  failures in patients who undergo lumpectomy. We discussed the process of simulation and the placement tattoos. We discussed 6 weeks of treatment as an outpatient to the breast and high axilla/supraclavicular lymph nodes. We discussed the possibility of asymptomatic lung damage. We discussed the low likelihood of secondary malignancies. We discussed the possible side effects including but not limited to skin redness, fatigue, permanent skin darkening, and breast swelling.   I have scheduled her for simulation next week. We will also get her in to see our cancer center dietician as well as into our After Breast Cancer class at physical therapy and our finding your new normal group.     Thea Silversmith, MD

## 2014-01-31 NOTE — Patient Instructions (Signed)

## 2014-02-01 ENCOUNTER — Telehealth: Payer: Self-pay | Admitting: Oncology

## 2014-02-01 ENCOUNTER — Other Ambulatory Visit: Payer: Self-pay | Admitting: Radiation Oncology

## 2014-02-01 ENCOUNTER — Encounter: Payer: BC Managed Care – PPO | Admitting: Nutrition

## 2014-02-01 NOTE — Telephone Encounter (Signed)
Spk w/pt confimred r/s apt w/nutriction per 09/02 POF....Cherylann Banas

## 2014-02-06 ENCOUNTER — Ambulatory Visit
Admission: RE | Admit: 2014-02-06 | Discharge: 2014-02-06 | Disposition: A | Discharge status: Home / Self Care | Payer: BC Managed Care – PPO | Source: Ambulatory Visit | Attending: Radiation Oncology | Admitting: Radiation Oncology

## 2014-02-06 DIAGNOSIS — Z510 Encounter for antineoplastic radiation therapy: Secondary | ICD-10-CM | POA: Diagnosis not present

## 2014-02-06 DIAGNOSIS — C50312 Malignant neoplasm of lower-inner quadrant of left female breast: Secondary | ICD-10-CM

## 2014-02-06 NOTE — Progress Notes (Signed)
Radiation Oncology         [phone] ________________________________  Name: Debbie [name]      MRN: [id number]          Date: 02/06/2014              DOB: 1966-04-14  Optical Surface Tracking Plan:  Since intensity modulated radiotherapy (IMRT) and 3D conformal radiation treatment methods are predicated on accurate and precise positioning for treatment, intrafraction motion monitoring is medically necessary to ensure accurate and safe treatment delivery.  The ability to quantify intrafraction motion without excessive ionizing radiation dose can only be performed with optical surface tracking. Accordingly, surface imaging offers the opportunity to obtain 3D measurements of patient position throughout IMRT and 3D treatments without excessive radiation exposure.  I am ordering optical surface tracking for this patient's upcoming course of radiotherapy. ________________________________ Signature   Reference:   Ursula Alert, J, et al. Surface imaging-based analysis of intrafraction motion for breast radiotherapy patients.Journal of [place], n. 6, nov. 2014. ISSN [id number].   Available at: <http://www.jacmp.org/index.php/jacmp/article/view/4957>.

## 2014-02-06 NOTE — Progress Notes (Signed)
Name: Debbie [name]   MRN: [id number]  Date:  02/06/2014  DOB: Sep 13, 1965  Status:outpatient   DIAGNOSIS: Left Breast cancer.  CONSENT VERIFIED: yes SET UP: Patient is setup supine  IMMOBILIZATION:  The following immobilization was used:Custom Moldable Pillow, breast board.  NARRATIVE: [name] was brought to the [facility].  Identity was confirmed.  All relevant records and images related to the planned course of therapy were reviewed.  Then, the patient was positioned in a stable reproducible clinical set-up for radiation therapy.  Wires were placed to delineate the clinical extent of breast tissue. A wire was placed on the scar as well.  CT images were obtained.  An isocenter was placed. Skin markings were placed.  The position of the heart was then analyzed.  Due to the proximity of the heart to the chest wall, I felt she would benefit from deep inspiration breath hold for cardiac sparing.  She was then coached and rescanned in the breath hold position.  Acceptable cardiac sparing was achieved. The CT images were loaded into the planning software where the target and avoidance structures were contoured.  The radiation prescription was entered and confirmed. The patient was discharged in stable condition and tolerated simulation well.    TREATMENT PLANNING NOTE/3D Simulation Note Treatment planning then occurred. I have requested : MLC's, isodose plan, basic dose calculation  3D simulation was performed.  I personally constructed 3 complex treatment devices in the form of MLCs which will be used for beam modification and to protect critical structures including the heart and lung.  I have requested a dose volume histogram of the heart lung and tumor cavity.    RESPIRATORY MOTION MANAGEMENT SIMULATION - Deep Inspiration Breath Hold  NARRATIVE:  In order to account for effect of respiratory motion on target structures and other organs in the planning and delivery of radiotherapy,  this patient underwent respiratory motion management simulation.  To accomplish this, when the patient was brought to the CT simulation planning suite, a bellows was placed on the her abdomen.  Wave forms of the patient's breathing were obtained. Coaching was performed and practice sessions initiated to monitor her ability to obtain and maintain deep inspiration breath hold.  The CT images were loaded into the planning software and fused with her free breathing images by physics.  Acceptable cardiac sparing was achieved through the use of deep inspiration breath hold.  Planning will be performed on her breath hold scan

## 2014-02-09 ENCOUNTER — Encounter: Payer: Self-pay | Admitting: *Deleted

## 2014-02-09 ENCOUNTER — Encounter: Payer: Self-pay | Admitting: Nutrition

## 2014-02-09 ENCOUNTER — Encounter: Payer: BC Managed Care – PPO | Admitting: Nutrition

## 2014-02-09 DIAGNOSIS — Z510 Encounter for antineoplastic radiation therapy: Secondary | ICD-10-CM | POA: Diagnosis not present

## 2014-02-09 NOTE — Progress Notes (Signed)
Patient called and cancelled nutrition appointment.  Did not reschedule at this time.

## 2014-02-13 ENCOUNTER — Ambulatory Visit
Admission: RE | Admit: 2014-02-13 | Discharge: 2014-02-13 | Disposition: A | Discharge status: Home / Self Care | Payer: BC Managed Care – PPO | Source: Ambulatory Visit | Attending: Radiation Oncology | Admitting: Radiation Oncology

## 2014-02-13 DIAGNOSIS — Z510 Encounter for antineoplastic radiation therapy: Secondary | ICD-10-CM | POA: Diagnosis not present

## 2014-02-13 DIAGNOSIS — C50312 Malignant neoplasm of lower-inner quadrant of left female breast: Secondary | ICD-10-CM

## 2014-02-13 NOTE — Progress Notes (Signed)
  Radiation Oncology         (336) [phone] ________________________________  Name: Debbie [name] MRN: [id number]  Date: 02/13/2014  DOB: [birth year]  Simulation Verification Note  Status: outpatient  NARRATIVE: The patient was brought to the treatment unit and placed in the planned treatment position. The clinical setup was verified. Then port films were obtained and uploaded to the radiation oncology medical record software.  The treatment beams were carefully compared against the planned radiation fields. The position location and shape of the radiation fields was reviewed. The targeted volume of tissue appears appropriately covered by the radiation beams. Organs at risk appear to be excluded as planned.  Based on my personal review, I approved the simulation verification. The patient's treatment will proceed as planned.  ------------------------------------------------  Thea Silversmith, MD

## 2014-02-14 ENCOUNTER — Ambulatory Visit
Admission: RE | Admit: 2014-02-14 | Discharge: 2014-02-14 | Disposition: A | Discharge status: Home / Self Care | Payer: BC Managed Care – PPO | Source: Ambulatory Visit | Attending: Radiation Oncology | Admitting: Radiation Oncology

## 2014-02-14 DIAGNOSIS — Z510 Encounter for antineoplastic radiation therapy: Secondary | ICD-10-CM | POA: Diagnosis not present

## 2014-02-15 ENCOUNTER — Ambulatory Visit
Admission: RE | Admit: 2014-02-15 | Discharge: 2014-02-15 | Disposition: A | Discharge status: Home / Self Care | Payer: BC Managed Care – PPO | Source: Ambulatory Visit | Attending: Radiation Oncology | Admitting: Radiation Oncology

## 2014-02-15 DIAGNOSIS — Z510 Encounter for antineoplastic radiation therapy: Secondary | ICD-10-CM | POA: Diagnosis not present

## 2014-02-16 ENCOUNTER — Ambulatory Visit
Admission: RE | Admit: 2014-02-16 | Discharge: 2014-02-16 | Disposition: A | Discharge status: Home / Self Care | Payer: BC Managed Care – PPO | Source: Ambulatory Visit | Attending: Radiation Oncology | Admitting: Radiation Oncology

## 2014-02-16 DIAGNOSIS — Z510 Encounter for antineoplastic radiation therapy: Secondary | ICD-10-CM | POA: Diagnosis not present

## 2014-02-16 DIAGNOSIS — C50312 Malignant neoplasm of lower-inner quadrant of left female breast: Secondary | ICD-10-CM

## 2014-02-16 MED ORDER — ALRA NON-METALLIC DEODORANT (RAD-ONC)
1.0000 "application " | Freq: Once | TOPICAL | Status: AC
  Administered 2014-02-16: 1 via TOPICAL

## 2014-02-16 MED ORDER — RADIAPLEXRX EX GEL
Freq: Once | CUTANEOUS | Status: AC
Start: 2014-02-16 — End: 2014-02-16
  Administered 2014-02-16: 15:00:00 via TOPICAL

## 2014-02-16 NOTE — Progress Notes (Signed)
Patient education completed with patient. Gave her "Radiation and You" booklet w/all pertinent information marked and discussed, re: fatigue, skin irritation/care, nutrition, pain. Gave pt Radiaplex, [place] with instructions for proper use. All questions answered, pt verbalized understanding.

## 2014-02-19 ENCOUNTER — Ambulatory Visit
Admission: RE | Admit: 2014-02-19 | Discharge: 2014-02-19 | Disposition: A | Discharge status: Home / Self Care | Payer: BC Managed Care – PPO | Source: Ambulatory Visit | Attending: Radiation Oncology | Admitting: Radiation Oncology

## 2014-02-19 DIAGNOSIS — Z510 Encounter for antineoplastic radiation therapy: Secondary | ICD-10-CM | POA: Diagnosis not present

## 2014-02-20 ENCOUNTER — Telehealth: Payer: Self-pay | Admitting: *Deleted

## 2014-02-20 ENCOUNTER — Ambulatory Visit
Admission: RE | Admit: 2014-02-20 | Discharge: 2014-02-20 | Disposition: A | Discharge status: Home / Self Care | Payer: BC Managed Care – PPO | Source: Ambulatory Visit | Attending: Radiation Oncology | Admitting: Radiation Oncology

## 2014-02-20 VITALS — BP 105/64 | HR 83 | Temp 97.8°F | Wt 199.2 lb

## 2014-02-20 DIAGNOSIS — Z510 Encounter for antineoplastic radiation therapy: Secondary | ICD-10-CM | POA: Diagnosis not present

## 2014-02-20 DIAGNOSIS — C50312 Malignant neoplasm of lower-inner quadrant of left female breast: Secondary | ICD-10-CM

## 2014-02-20 NOTE — Progress Notes (Signed)
Weekly assessment of radiation to left breast.Completed 5 treatments so far.No skin changes.Continued residual fatigue from chemotherapy.Denies pain.Has mild itching rash above right lip.Told to take her hydroxyzine .

## 2014-02-20 NOTE — Telephone Encounter (Signed)
Received call from [place], RN in radiation oncology asking if patient could see Selena Lesser, NP tomorrow in regards to race on patient's face. Patient has not had any chemotherapy since 11/09/13. Spoke with Jac Canavan, RN and she is in agreence that this is not a medical oncology problem since it has been 3 month since any chemo treatment . Called back to radiation oncology and spoke with Thayer Headings, RN to let her know that if they cannot see her for facial race to please refer her to primary care MD since patient is not under active treatment. Thayer Headings states she will pass message along to [name].

## 2014-02-20 NOTE — Progress Notes (Signed)
Weekly Management Note Current Dose: 9 Gy  Projected Dose:61  Gy   Narrative:  The patient presents for routine under treatment assessment.  CBCT/MVCT images/Port film x-rays were reviewed.  The chart was checked. Doing well. Sore under arm. "Goes to sleep" during RT. Not seen byPT and no ABC class. Using radiaplex. Rash over face worse over past 1 week.   Physical Findings: Weight: 199 lb 3.2 oz (90.357 kg). Unchanged.   Impression:  The patient is tolerating radiation.  Plan:  Continue treatment as planned. Start radiaplex. Will refer to symptom management clinic for facial rash.

## 2014-02-20 NOTE — Telephone Encounter (Signed)
Debbie [name] returned call to speak with Debbie [name], about patient facial rash, per Debbie [name], patient hasn't been given chemotherapy in the past 3 months so doubt this is chemo related, patient should see her Primary MD for this", will relay message to Debbie [name] and Debbie [name] 3:46 PM

## 2014-02-21 ENCOUNTER — Ambulatory Visit
Admission: RE | Admit: 2014-02-21 | Discharge: 2014-02-21 | Disposition: A | Discharge status: Home / Self Care | Payer: BC Managed Care – PPO | Source: Ambulatory Visit | Attending: Radiation Oncology | Admitting: Radiation Oncology

## 2014-02-21 DIAGNOSIS — Z510 Encounter for antineoplastic radiation therapy: Secondary | ICD-10-CM | POA: Diagnosis not present

## 2014-02-22 ENCOUNTER — Ambulatory Visit
Admission: RE | Admit: 2014-02-22 | Discharge: 2014-02-22 | Disposition: A | Discharge status: Home / Self Care | Payer: BC Managed Care – PPO | Source: Ambulatory Visit | Attending: Radiation Oncology | Admitting: Radiation Oncology

## 2014-02-22 DIAGNOSIS — Z510 Encounter for antineoplastic radiation therapy: Secondary | ICD-10-CM | POA: Diagnosis not present

## 2014-02-22 NOTE — Addendum Note (Signed)
Encounter addended by: [name], RN on: 02/22/2014 11:40 AM<BR>     Documentation filed: Charges VN

## 2014-02-23 ENCOUNTER — Ambulatory Visit: Payer: BC Managed Care – PPO

## 2014-02-26 ENCOUNTER — Ambulatory Visit
Admission: RE | Admit: 2014-02-26 | Discharge: 2014-02-26 | Disposition: A | Discharge status: Home / Self Care | Payer: BC Managed Care – PPO | Source: Ambulatory Visit | Attending: Radiation Oncology | Admitting: Radiation Oncology

## 2014-02-26 DIAGNOSIS — Z170 Estrogen receptor positive status [ER+]: Secondary | ICD-10-CM | POA: Insufficient documentation

## 2014-02-26 DIAGNOSIS — C50919 Malignant neoplasm of unspecified site of unspecified female breast: Secondary | ICD-10-CM | POA: Insufficient documentation

## 2014-02-26 DIAGNOSIS — Z9221 Personal history of antineoplastic chemotherapy: Secondary | ICD-10-CM | POA: Diagnosis not present

## 2014-02-26 DIAGNOSIS — Z510 Encounter for antineoplastic radiation therapy: Secondary | ICD-10-CM | POA: Insufficient documentation

## 2014-02-27 ENCOUNTER — Ambulatory Visit: Payer: BC Managed Care – PPO

## 2014-02-27 ENCOUNTER — Ambulatory Visit: Payer: BC Managed Care – PPO | Admitting: Radiation Oncology

## 2014-02-28 ENCOUNTER — Ambulatory Visit
Admission: RE | Admit: 2014-02-28 | Discharge: 2014-02-28 | Disposition: A | Discharge status: Home / Self Care | Payer: BC Managed Care – PPO | Source: Ambulatory Visit | Attending: Radiation Oncology | Admitting: Radiation Oncology

## 2014-02-28 DIAGNOSIS — Z510 Encounter for antineoplastic radiation therapy: Secondary | ICD-10-CM | POA: Diagnosis not present

## 2014-03-01 ENCOUNTER — Ambulatory Visit
Admission: RE | Admit: 2014-03-01 | Discharge: 2014-03-01 | Disposition: A | Discharge status: Home / Self Care | Payer: BC Managed Care – PPO | Source: Ambulatory Visit | Attending: Radiation Oncology | Admitting: Radiation Oncology

## 2014-03-01 ENCOUNTER — Encounter: Payer: Self-pay | Admitting: Radiation Oncology

## 2014-03-01 DIAGNOSIS — Z510 Encounter for antineoplastic radiation therapy: Secondary | ICD-10-CM | POA: Insufficient documentation

## 2014-03-01 DIAGNOSIS — C50312 Malignant neoplasm of lower-inner quadrant of left female breast: Secondary | ICD-10-CM | POA: Diagnosis not present

## 2014-03-01 NOTE — Progress Notes (Signed)
Weekly Management Note Current Dose: 18  Gy  Projected Dose: 45 Gy   Narrative:  The patient presents for routine under treatment assessment.  CBCT/MVCT images/Port film x-rays were reviewed.  The chart was checked. Migraine today. Skin looks and feels good.  Physical Findings: Weight:  . Unchanged  Impression:  The patient is tolerating radiation.  Plan:  Continue treatment as planned. Continue radiaplex.

## 2014-03-02 ENCOUNTER — Encounter: Payer: Self-pay | Admitting: *Deleted

## 2014-03-02 ENCOUNTER — Ambulatory Visit
Admission: RE | Admit: 2014-03-02 | Discharge: 2014-03-02 | Disposition: A | Discharge status: Home / Self Care | Payer: BC Managed Care – PPO | Source: Ambulatory Visit | Attending: Radiation Oncology | Admitting: Radiation Oncology

## 2014-03-02 DIAGNOSIS — Z510 Encounter for antineoplastic radiation therapy: Secondary | ICD-10-CM | POA: Diagnosis not present

## 2014-03-02 NOTE — Progress Notes (Signed)
[place] Work  Clinical Social Work was referred by P[employer] scheme manager for assessment of psychosocial needs due to possible need for emotional support.  Clinical Social Worker attempted to contact patient at home to offer support and assess for needs. CSW left vm for pt to return call. CSW will continue to try to reach pt.      Loren Racer, [place] Worker [place]  [place] Phone: [phone] Fax: [phone]

## 2014-03-05 ENCOUNTER — Ambulatory Visit
Admission: RE | Admit: 2014-03-05 | Discharge: 2014-03-05 | Disposition: A | Discharge status: Home / Self Care | Payer: BC Managed Care – PPO | Source: Ambulatory Visit | Attending: Radiation Oncology | Admitting: Radiation Oncology

## 2014-03-05 DIAGNOSIS — Z510 Encounter for antineoplastic radiation therapy: Secondary | ICD-10-CM | POA: Diagnosis not present

## 2014-03-06 ENCOUNTER — Ambulatory Visit
Admission: RE | Admit: 2014-03-06 | Discharge: 2014-03-06 | Disposition: A | Discharge status: Home / Self Care | Payer: BC Managed Care – PPO | Source: Ambulatory Visit | Attending: Radiation Oncology | Admitting: Radiation Oncology

## 2014-03-06 VITALS — BP 119/76 | HR 86 | Temp 98.8°F | Wt 197.1 lb

## 2014-03-06 DIAGNOSIS — Z510 Encounter for antineoplastic radiation therapy: Secondary | ICD-10-CM | POA: Diagnosis not present

## 2014-03-06 DIAGNOSIS — C50312 Malignant neoplasm of lower-inner quadrant of left female breast: Secondary | ICD-10-CM

## 2014-03-06 NOTE — Progress Notes (Signed)
Weekly assessment of radiation to left breast.Mild discoloration.Contine application of radiaplex.Came in later today as she had migraine headache this morning.

## 2014-03-06 NOTE — Progress Notes (Signed)
Weekly Management Note Current Dose:  23.4y  Projected Dose: 61y   Narrative:  The patient presents for routine under treatment assessment.  CBCT/MVCT images/Port film x-rays were reviewed.  The chart was checked. More migraines (? riod coming back). Using radiaplex.  Physical Findings: Weight: 197 lb 1.6 oz (89.404 kg). Pink skin over breast.   Impression:  The patient is tolerating radiation.  Plan:  Continue treatment as planned. Continue radiaplex.

## 2014-03-07 ENCOUNTER — Ambulatory Visit
Admission: RE | Admit: 2014-03-07 | Discharge: 2014-03-07 | Disposition: A | Discharge status: Home / Self Care | Payer: BC Managed Care – PPO | Source: Ambulatory Visit | Attending: Radiation Oncology | Admitting: Radiation Oncology

## 2014-03-07 DIAGNOSIS — Z510 Encounter for antineoplastic radiation therapy: Secondary | ICD-10-CM | POA: Diagnosis not present

## 2014-03-08 ENCOUNTER — Ambulatory Visit
Admission: RE | Admit: 2014-03-08 | Discharge: 2014-03-08 | Disposition: A | Discharge status: Home / Self Care | Payer: BC Managed Care – PPO | Source: Ambulatory Visit | Attending: Radiation Oncology | Admitting: Radiation Oncology

## 2014-03-08 DIAGNOSIS — Z510 Encounter for antineoplastic radiation therapy: Secondary | ICD-10-CM | POA: Diagnosis not present

## 2014-03-09 ENCOUNTER — Ambulatory Visit
Admission: RE | Admit: 2014-03-09 | Discharge: 2014-03-09 | Disposition: A | Discharge status: Home / Self Care | Payer: BC Managed Care – PPO | Source: Ambulatory Visit | Attending: Radiation Oncology | Admitting: Radiation Oncology

## 2014-03-09 DIAGNOSIS — Z510 Encounter for antineoplastic radiation therapy: Secondary | ICD-10-CM | POA: Diagnosis not present

## 2014-03-12 ENCOUNTER — Ambulatory Visit
Admission: RE | Admit: 2014-03-12 | Discharge: 2014-03-12 | Disposition: A | Discharge status: Home / Self Care | Payer: BC Managed Care – PPO | Source: Ambulatory Visit | Attending: Radiation Oncology | Admitting: Radiation Oncology

## 2014-03-12 DIAGNOSIS — Z510 Encounter for antineoplastic radiation therapy: Secondary | ICD-10-CM | POA: Diagnosis not present

## 2014-03-13 ENCOUNTER — Ambulatory Visit: Payer: BC Managed Care – PPO | Admitting: Radiation Oncology

## 2014-03-13 ENCOUNTER — Ambulatory Visit
Admission: RE | Admit: 2014-03-13 | Discharge: 2014-03-13 | Disposition: A | Discharge status: Home / Self Care | Payer: BC Managed Care – PPO | Source: Ambulatory Visit | Attending: Radiation Oncology | Admitting: Radiation Oncology

## 2014-03-13 VITALS — BP 137/68 | HR 90 | Temp 97.9°F | Wt 202.7 lb

## 2014-03-13 DIAGNOSIS — Z510 Encounter for antineoplastic radiation therapy: Secondary | ICD-10-CM | POA: Diagnosis not present

## 2014-03-13 DIAGNOSIS — C50312 Malignant neoplasm of lower-inner quadrant of left female breast: Secondary | ICD-10-CM

## 2014-03-13 NOTE — Progress Notes (Signed)
Weekly Management Note Current Dose: 32.4  Gy  Projected Dose: 61 Gy   Narrative:  The patient presents for routine under treatment assessment.  CBCT/MVCT images/Port film x-rays were reviewed.  The chart was checked. Doing well. No complaints. Using radiaplex.   Physical Findings: Weight: 202 lb 11.2 oz (91.944 kg). Slightly pink left breast  Impression:  The patient is tolerating radiation.  Plan:  Continue treatment as planned. Continue radiaplex.

## 2014-03-13 NOTE — Progress Notes (Signed)
Weekly assessment of radiation to left breast.Day 18 .Mild discoloration.Increased fatigue and hot flashes.Continue application of radiaplex.

## 2014-03-14 ENCOUNTER — Ambulatory Visit
Admission: RE | Admit: 2014-03-14 | Discharge: 2014-03-14 | Disposition: A | Discharge status: Home / Self Care | Payer: BC Managed Care – PPO | Source: Ambulatory Visit | Attending: Radiation Oncology | Admitting: Radiation Oncology

## 2014-03-14 ENCOUNTER — Telehealth: Payer: Self-pay | Admitting: Oncology

## 2014-03-14 DIAGNOSIS — Z510 Encounter for antineoplastic radiation therapy: Secondary | ICD-10-CM | POA: Diagnosis not present

## 2014-03-14 NOTE — Telephone Encounter (Signed)
, °

## 2014-03-15 ENCOUNTER — Ambulatory Visit
Admission: RE | Admit: 2014-03-15 | Discharge: 2014-03-15 | Disposition: A | Discharge status: Home / Self Care | Payer: BC Managed Care – PPO | Source: Ambulatory Visit | Attending: Radiation Oncology | Admitting: Radiation Oncology

## 2014-03-15 DIAGNOSIS — Z510 Encounter for antineoplastic radiation therapy: Secondary | ICD-10-CM | POA: Diagnosis not present

## 2014-03-16 ENCOUNTER — Ambulatory Visit
Admission: RE | Admit: 2014-03-16 | Discharge: 2014-03-16 | Disposition: A | Discharge status: Home / Self Care | Payer: BC Managed Care – PPO | Source: Ambulatory Visit | Attending: Radiation Oncology | Admitting: Radiation Oncology

## 2014-03-16 DIAGNOSIS — Z510 Encounter for antineoplastic radiation therapy: Secondary | ICD-10-CM | POA: Diagnosis not present

## 2014-03-19 ENCOUNTER — Ambulatory Visit
Admission: RE | Admit: 2014-03-19 | Discharge: 2014-03-19 | Disposition: A | Discharge status: Home / Self Care | Payer: BC Managed Care – PPO | Source: Ambulatory Visit | Attending: Radiation Oncology | Admitting: Radiation Oncology

## 2014-03-19 DIAGNOSIS — Z510 Encounter for antineoplastic radiation therapy: Secondary | ICD-10-CM | POA: Diagnosis not present

## 2014-03-20 ENCOUNTER — Ambulatory Visit
Admission: RE | Admit: 2014-03-20 | Discharge: 2014-03-20 | Disposition: A | Discharge status: Home / Self Care | Payer: BC Managed Care – PPO | Source: Ambulatory Visit | Attending: Radiation Oncology | Admitting: Radiation Oncology

## 2014-03-20 ENCOUNTER — Encounter: Payer: Self-pay | Admitting: *Deleted

## 2014-03-20 ENCOUNTER — Ambulatory Visit: Payer: BC Managed Care – PPO | Admitting: Radiation Oncology

## 2014-03-20 DIAGNOSIS — Z510 Encounter for antineoplastic radiation therapy: Secondary | ICD-10-CM | POA: Diagnosis not present

## 2014-03-21 ENCOUNTER — Ambulatory Visit: Payer: BC Managed Care – PPO

## 2014-03-21 ENCOUNTER — Telehealth: Payer: Self-pay | Admitting: Oncology

## 2014-03-21 ENCOUNTER — Ambulatory Visit
Admission: RE | Admit: 2014-03-21 | Discharge: 2014-03-21 | Disposition: A | Discharge status: Home / Self Care | Payer: BC Managed Care – PPO | Source: Ambulatory Visit | Attending: Radiation Oncology | Admitting: Radiation Oncology

## 2014-03-21 ENCOUNTER — Other Ambulatory Visit: Payer: Self-pay | Admitting: *Deleted

## 2014-03-21 DIAGNOSIS — Z510 Encounter for antineoplastic radiation therapy: Secondary | ICD-10-CM | POA: Diagnosis not present

## 2014-03-21 NOTE — [facility] Oncology Navigator Note (Signed)
Late entry:  Patient at [facility] for radiation treatment 03/20/14 at 10:30 AM.  Patient reports that she is doing well and tolerating treatments well.  She is beginning to experience a little redness at the treatment site.  She denies any questions or concerns at this time.  I encouraged her to call me for any needs.

## 2014-03-22 ENCOUNTER — Ambulatory Visit
Admission: RE | Admit: 2014-03-22 | Discharge: 2014-03-22 | Disposition: A | Discharge status: Home / Self Care | Payer: BC Managed Care – PPO | Source: Ambulatory Visit | Attending: Radiation Oncology | Admitting: Radiation Oncology

## 2014-03-22 ENCOUNTER — Ambulatory Visit: Payer: BC Managed Care – PPO

## 2014-03-22 DIAGNOSIS — Z510 Encounter for antineoplastic radiation therapy: Secondary | ICD-10-CM | POA: Diagnosis not present

## 2014-03-22 DIAGNOSIS — C50312 Malignant neoplasm of lower-inner quadrant of left female breast: Secondary | ICD-10-CM

## 2014-03-22 NOTE — Progress Notes (Signed)
Weekly Management Note Current Dose: 45  Gy  Projected Dose: 61 Gy   Narrative:  The patient presents for routine under treatment assessment.  CBCT/MVCT images/Port film x-rays were reviewed.  The chart was checked. Doing well. Complaints of skin irritation and peeling under breast.   Physical Findings: Moist desquamation under breast.   Impression:  The patient is tolerating radiation.  Plan:  Continue treatment as planned. Add neosporin plus pain.

## 2014-03-23 ENCOUNTER — Ambulatory Visit (HOSPITAL_BASED_OUTPATIENT_CLINIC_OR_DEPARTMENT_OTHER): Payer: BC Managed Care – PPO

## 2014-03-23 ENCOUNTER — Ambulatory Visit
Admission: RE | Admit: 2014-03-23 | Discharge: 2014-03-23 | Disposition: A | Discharge status: Home / Self Care | Payer: BC Managed Care – PPO | Source: Ambulatory Visit | Attending: Radiation Oncology | Admitting: Radiation Oncology

## 2014-03-23 ENCOUNTER — Ambulatory Visit: Payer: BC Managed Care – PPO

## 2014-03-23 DIAGNOSIS — Z510 Encounter for antineoplastic radiation therapy: Secondary | ICD-10-CM | POA: Diagnosis not present

## 2014-03-23 DIAGNOSIS — Z23 Encounter for immunization: Secondary | ICD-10-CM

## 2014-03-23 MED ORDER — INFLUENZA VAC SPLIT QUAD 0.5 ML IM SUSY
0.5000 mL | PREFILLED_SYRINGE | Freq: Once | INTRAMUSCULAR | Status: AC
  Administered 2014-03-23: 0.5 mL via INTRAMUSCULAR
  Filled 2014-03-23: qty 0.5

## 2014-03-23 NOTE — Patient Instructions (Signed)
Avian Influenza Viruses Avian influenza or "bird flu" is also known as the H5N1 virus. It occurs naturally in wild and domestic birds. Bird flu is easily spread (contagious) among birds and is deadly to them. Though rare, bird flu can cause disease in humans.  CAUSES  Infected birds can shed the H5N1 virus through:   Feces.  Nasal secretions.  Saliva. Birds become infected when they come into contact with infected birds or contaminated surfaces. The bird flu virus is spread from country to country through international poultry trade or by migrating birds.  MODES OF TRANSMISSION TO HUMANS The bird flu virus does not normally infect humans. However, the virus can infect humans who have contact with infected birds, breathe in dust or touch surfaces contaminated with the virus. Human-to-human transmission of the H5N1 virus has been rare. The virus lacks the ability to grow itself (replicate) in humans. However, because all influenza viruses can mutate, scientists are concerned the H5N1 virus will someday replicate itself and make human-to-human transmission easier. If this happens, an influenza "pandemic" could occur.  SYMPTOMS   Symptoms of H5N1 virus are similar to other influenza viruses:  Fever.  Cough.  Sore throat.  Nausea and vomiting.  Diarrhea.  Muscle aches.  Tiredness (malaise).  Some people may get inflammation or redness of the eyes (conjunctivitis).  Life-threatening complications may result in the death of the patient. These include:  Viral pneumonia.  Breathing (respiratory) distress syndrome.  Multi-organ failure. DIAGNOSIS   A person with a respiratory illness may be suffering from bird flu if direct or indirect contact has been made with infected birds. This includes handling or taking care of sick birds. The H5N1 virus may also be suspected if a person has breathed in particles or touched surfaces contaminated with the virus.  In addition to the above  symptoms, a chest X-ray is useful to detect pneumonia.  Fluid specimens such as a sputum sample may be sent to a laboratory for further investigation.  Blood tests may be done to help detect the H5N1 virus. TREATMENT   The H5N1 virus has shown resistance to amantadine and rimantadine, which are two antiviral drugs commonly used for other influenza viruses. However, two other antivirals, oseltamivir and zanamivir, seem to be effective against the H5N1 strain.  If bird flu is suspected in a person, treatment should start immediately without waiting for laboratory confirmation.  Treatment for the H5N1 strain is essentially the same as treating other influenza viruses. PREVENTION   Stay home from work, school, and errands when you are sick. Not being in contact with other people will help stop the spread of illness.  Cover your mouth and nose with your arm when coughing or sneezing. This may help keep those around you from getting sick.  Wash your hands often with warm water and soap. Illnesses are often spread when a person touches something that is contaminated with germs and then touches his or her eyes, nose, or mouth.  Antiviral medications can help prevent the flu.  For optimal health, get plenty of rest, eat a healthy diet, and exercise. Document Released: 05/21/2003 Document Revised: 10/02/2013 Document Reviewed: 12/15/2007 ExitCare Patient Information 2015 [employer]. This information is not intended to replace advice given to you by your health care provider. Make sure you discuss any questions you have with your health care provider.  

## 2014-03-26 ENCOUNTER — Ambulatory Visit
Admission: RE | Admit: 2014-03-26 | Discharge: 2014-03-26 | Disposition: A | Discharge status: Home / Self Care | Payer: BC Managed Care – PPO | Source: Ambulatory Visit | Attending: Radiation Oncology | Admitting: Radiation Oncology

## 2014-03-26 DIAGNOSIS — Z510 Encounter for antineoplastic radiation therapy: Secondary | ICD-10-CM | POA: Diagnosis not present

## 2014-03-27 ENCOUNTER — Ambulatory Visit
Admission: RE | Admit: 2014-03-27 | Discharge: 2014-03-27 | Disposition: A | Discharge status: Home / Self Care | Payer: BC Managed Care – PPO | Source: Ambulatory Visit | Attending: Radiation Oncology | Admitting: Radiation Oncology

## 2014-03-27 VITALS — BP 124/74 | HR 87 | Temp 98.3°F | Wt 202.4 lb

## 2014-03-27 DIAGNOSIS — C50312 Malignant neoplasm of lower-inner quadrant of left female breast: Secondary | ICD-10-CM

## 2014-03-27 DIAGNOSIS — Z510 Encounter for antineoplastic radiation therapy: Secondary | ICD-10-CM | POA: Diagnosis not present

## 2014-03-27 NOTE — Progress Notes (Signed)
Weekly  Assessment of radiation to left breast and supraclavicular region.Patient has 5 more treatments of boost remaining.Denies pain.Has increased redness of supraclavicular region and peeling of left mammary fold which she is using neosporin plus pain.Had flu shot last week.Has a non-productive cough with whistling sound greater at bedtime.took deslym cough medication up until 2 night ago.Scheduled for medical oncology on next week.

## 2014-03-27 NOTE — Progress Notes (Signed)
  Radiation Oncology         (336) [phone] ________________________________  Name: Debbie [name] MRN: [id number]  Date: 03/27/2014  DOB: 1966-02-12  Weekly Radiation Therapy Management  Cancer of lower-inner quadrant of left female breast   Primary site: Breast (Left)   Staging method: AJCC 7th Edition   Clinical: Stage IIB (T2, N1, cM0)   Summary: Stage IIB (T2, N1, cM0)   Clinical comments: Staged at breast conference 07/12/13.   Current Dose: 51 Gy     Planned Dose:  61 Gy  Narrative . . . . . . . . The patient presents for routine under treatment assessment.                                   The patient is without complaint for some discomfort in the inframammary fold. Patient is placing a Triple Antibiotic ointment in this area. She has had a dry cough but no fever or productive sputum.                                 Set-up films were reviewed.                                 The chart was checked. Physical Findings. . .  weight is 202 lb 6.4 oz (91.808 kg). Her temperature is 98.3 F (36.8 C). Her blood pressure is 124/74 and her pulse is 87. Her oxygen saturation is 98%. .  The lungs are clear. The heart has regular rhythm and rate. The left breast area shows erythema and hyperpigmentation changes. Patient has early moist desquamation in inframammary fold. Impression . . . . . . . The patient is tolerating radiation. Plan . . . . . . . . . . . . Continue treatment as planned.  ________________________________   Blair Promise, PhD, MD

## 2014-03-28 ENCOUNTER — Ambulatory Visit (HOSPITAL_BASED_OUTPATIENT_CLINIC_OR_DEPARTMENT_OTHER): Payer: BC Managed Care – PPO

## 2014-03-28 ENCOUNTER — Ambulatory Visit
Admission: RE | Admit: 2014-03-28 | Discharge: 2014-03-28 | Disposition: A | Discharge status: Home / Self Care | Payer: BC Managed Care – PPO | Source: Ambulatory Visit | Attending: Radiation Oncology | Admitting: Radiation Oncology

## 2014-03-28 DIAGNOSIS — Z95828 Presence of other vascular implants and grafts: Secondary | ICD-10-CM

## 2014-03-28 DIAGNOSIS — Z452 Encounter for adjustment and management of vascular access device: Secondary | ICD-10-CM

## 2014-03-28 DIAGNOSIS — C50812 Malignant neoplasm of overlapping sites of left female breast: Secondary | ICD-10-CM

## 2014-03-28 DIAGNOSIS — C773 Secondary and unspecified malignant neoplasm of axilla and upper limb lymph nodes: Secondary | ICD-10-CM

## 2014-03-28 DIAGNOSIS — Z510 Encounter for antineoplastic radiation therapy: Secondary | ICD-10-CM | POA: Diagnosis not present

## 2014-03-28 MED ORDER — SODIUM CHLORIDE 0.9 % IJ SOLN
10.0000 mL | INTRAMUSCULAR | Status: DC | PRN
  Administered 2014-03-28: 10 mL via INTRAVENOUS
  Filled 2014-03-28: qty 10

## 2014-03-28 MED ORDER — HEPARIN SOD (PORK) LOCK FLUSH 100 UNIT/ML IV SOLN
500.0000 [IU] | Freq: Once | INTRAVENOUS | Status: AC
  Administered 2014-03-28: 500 [IU] via INTRAVENOUS
  Filled 2014-03-28: qty 5

## 2014-03-29 ENCOUNTER — Ambulatory Visit
Admission: RE | Admit: 2014-03-29 | Discharge: 2014-03-29 | Disposition: A | Discharge status: Home / Self Care | Payer: BC Managed Care – PPO | Source: Ambulatory Visit | Attending: Radiation Oncology | Admitting: Radiation Oncology

## 2014-03-29 DIAGNOSIS — Z510 Encounter for antineoplastic radiation therapy: Secondary | ICD-10-CM | POA: Diagnosis not present

## 2014-03-30 ENCOUNTER — Ambulatory Visit: Payer: BC Managed Care – PPO

## 2014-03-30 ENCOUNTER — Ambulatory Visit
Admission: RE | Admit: 2014-03-30 | Discharge: 2014-03-30 | Disposition: A | Discharge status: Home / Self Care | Payer: BC Managed Care – PPO | Source: Ambulatory Visit | Attending: Radiation Oncology | Admitting: Radiation Oncology

## 2014-03-30 DIAGNOSIS — Z510 Encounter for antineoplastic radiation therapy: Secondary | ICD-10-CM | POA: Diagnosis not present

## 2014-04-02 ENCOUNTER — Ambulatory Visit: Payer: BC Managed Care – PPO

## 2014-04-02 ENCOUNTER — Encounter: Payer: Self-pay | Admitting: *Deleted

## 2014-04-02 ENCOUNTER — Ambulatory Visit
Admission: RE | Admit: 2014-04-02 | Discharge: 2014-04-02 | Disposition: A | Discharge status: Home / Self Care | Payer: BC Managed Care – PPO | Source: Ambulatory Visit | Attending: Radiation Oncology | Admitting: Radiation Oncology

## 2014-04-02 DIAGNOSIS — Z510 Encounter for antineoplastic radiation therapy: Secondary | ICD-10-CM | POA: Diagnosis not present

## 2014-04-03 ENCOUNTER — Ambulatory Visit
Admission: RE | Admit: 2014-04-03 | Discharge: 2014-04-03 | Disposition: A | Discharge status: Home / Self Care | Payer: BC Managed Care – PPO | Source: Ambulatory Visit | Attending: Radiation Oncology | Admitting: Radiation Oncology

## 2014-04-03 ENCOUNTER — Other Ambulatory Visit (HOSPITAL_BASED_OUTPATIENT_CLINIC_OR_DEPARTMENT_OTHER): Payer: BC Managed Care – PPO

## 2014-04-03 ENCOUNTER — Telehealth: Payer: Self-pay | Admitting: Oncology

## 2014-04-03 ENCOUNTER — Ambulatory Visit: Payer: BC Managed Care – PPO

## 2014-04-03 ENCOUNTER — Ambulatory Visit (HOSPITAL_BASED_OUTPATIENT_CLINIC_OR_DEPARTMENT_OTHER): Payer: BC Managed Care – PPO | Admitting: Oncology

## 2014-04-03 ENCOUNTER — Other Ambulatory Visit: Payer: BC Managed Care – PPO

## 2014-04-03 ENCOUNTER — Encounter: Payer: Self-pay | Admitting: Radiation Oncology

## 2014-04-03 VITALS — BP 120/65 | HR 85 | Temp 98.1°F

## 2014-04-03 VITALS — BP 130/72 | HR 79 | Temp 98.2°F | Resp 20 | Ht 64.5 in | Wt 204.9 lb

## 2014-04-03 DIAGNOSIS — Z170 Estrogen receptor positive status [ER+]: Secondary | ICD-10-CM

## 2014-04-03 DIAGNOSIS — Z510 Encounter for antineoplastic radiation therapy: Secondary | ICD-10-CM | POA: Diagnosis not present

## 2014-04-03 DIAGNOSIS — C773 Secondary and unspecified malignant neoplasm of axilla and upper limb lymph nodes: Secondary | ICD-10-CM

## 2014-04-03 DIAGNOSIS — C50312 Malignant neoplasm of lower-inner quadrant of left female breast: Secondary | ICD-10-CM

## 2014-04-03 LAB — CBC WITH DIFFERENTIAL/PLATELET
BASO%: 0.7 % (ref 0.0–2.0)
Basophils Absolute: 0 10*3/uL (ref 0.0–0.1)
EOS%: 5.6 % (ref 0.0–7.0)
Eosinophils Absolute: 0.4 10*3/uL (ref 0.0–0.5)
HCT: 39.5 % (ref 34.8–46.6)
HGB: 13 g/dL (ref 11.6–15.9)
LYMPH%: 11.4 % — ABNORMAL LOW (ref 14.0–49.7)
MCH: 25.6 pg (ref 25.1–34.0)
MCHC: 32.9 g/dL (ref 31.5–36.0)
MCV: 77.9 fL — ABNORMAL LOW (ref 79.5–101.0)
MONO#: 0.5 10*3/uL (ref 0.1–0.9)
MONO%: 8.1 % (ref 0.0–14.0)
NEUT#: 4.6 10*3/uL (ref 1.5–6.5)
NEUT%: 74.2 % (ref 38.4–76.8)
Platelets: 309 10*3/uL (ref 145–400)
RBC: 5.08 10*6/uL (ref 3.70–5.45)
RDW: 15.7 % — ABNORMAL HIGH (ref 11.2–14.5)
WBC: 6.3 10*3/uL (ref 3.9–10.3)
lymph#: 0.7 10*3/uL — ABNORMAL LOW (ref 0.9–3.3)

## 2014-04-03 NOTE — Telephone Encounter (Signed)
, °

## 2014-04-03 NOTE — Progress Notes (Signed)
  Radiation Oncology         [phone] ________________________________  Name: Debbie [name] MRN: [id number]  Date: 04/03/2014  DOB: [birth year]  End of Treatment Note  Diagnosis:   T2N1 Left breast Cancer     Indication for treatment:  Curative       Radiation treatment dates:   02/14/14-11/3  Site/dose:   Left breast/ 45 Gy at 1.8 Gy per fraction x 25 fractions.  Left supraclavicular fossa/ 45 Gy at 1.8 Gy per fraction x 25 fractions Left PAB/ 45 Gy at 1.8 Gy per fraction x 25 fractions Left breast boost/ 16 Gy at 2 Gy per fraction x 8 fractions  Beams/energy:  Opposed tangents with electronic compensation / 10 MV photons RAO/ 10 MV photons LPO/ 6 MV photons Enface electrons / 12 MeV electrons  Narrative: The patient tolerated radiation treatment relatively well.   She missed a few treatments due to childcare issues. She had some dry desquamation of her supraclavicular fossa. She was healing towards the end of treatment.   Plan: The patient has completed radiation treatment. The patient will return to radiation oncology clinic for routine followup in one month. I advised them to call or return sooner if they have any questions or concerns related to their recovery or treatment.  ------------------------------------------------  Thea Silversmith, MD

## 2014-04-03 NOTE — Progress Notes (Signed)
Debbie [name]  [phone] Fax:([phone]     ID: [name] OB: 1965/09/09  MR#: [id number]  VQM#:[id number]  PCP: [name], MD GYN:  [name]: [name]; [name] OTHER MD: Cline Cools, [place]  CHIEF COMPLAINT: locally advanced breast cancer CURRENT TREATMENT: completing radiation, then tamoxifen  BREAST CANCER HISTORY: As per previously documented note:  Kelbi had routine yearly screening mammography at the breast center 06/19/2013 showing a possible mass in the left breast. The right breast was unremarkable. On 06/30/2013 additional views of the left breast showed an irregular spiculated dense mass measuring 2.2 cm in the lower inner quadrant. This was palpable at the 8:00 location. Ultrasound showed an irregular hypoechoic mass measuring 2.3 cm and in the left axilla a dominant abnormal appearing lymph node with cortical thickening measuring 1.2 cm.  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  [id number] the patient underwent bilateral breast MRI. This showed her breasts to be dense (composition C.). In the left breast at the 8:00 position there was a 2.4 cm enhancing mass containing a biopsy clip. There were no additional areas of concern. In the axilla on the left there was a 2.6 cm enlarged left axillary lymph node. There was also a 0.5 cm left internal mammary lymph nodes noted. The right side was unremarkable.  The patient's subsequent history is as detailed below.  INTERVAL HISTORY: Lamisha returns today for followup of her  breast cancer. Since her last visit here she has undergone radiation, completing her treatments today. She generally tolerated that well. She had "the usual skin changes", and some fatigue. However she was able to continue her normal activities of daily living. She is walking regularly for exercise.   REVIEW OF SYSTEMS: Sometimes she feels she has blurred vision. This is very intermittent. She would like her port removed. Her migraines got better on chemotherapy but then went back to baseline. There however are not morepersistent or intense than before. She stopped having periods with chemotherapy. She is having some hot flashes. Vaginal dryness is not currently an issue. A detailed review of systems today was otherwise noncontributory   PAST MEDICAL HISTORY: Past Medical History  Diagnosis Date  . MVP (mitral valve prolapse)   . IBS (irritable bowel syndrome)   . Migraines     menstral  . Hyperlipidemia   . MVP (mitral valve prolapse)   . Migraines   . IBS (irritable bowel syndrome)   . Abnormal Pap smear   . Seasonal allergies   . Anxiety   . Menorrhagia   . Breast cancer 2015    ER+/PR+/Her2-    PAST SURGICAL HISTORY: Past Surgical History  Procedure Laterality Date  . Wisdom teeth removal    . Vulva /perineum biopsy  2011  . Svd       x 3  . Endometrial ablation    . Portacath placement Left 07/18/2013    Procedure: INSERTION PORT-A-CATH;  Surgeon: [name], MD;  Location: [facility] OR;  Service: General;  Laterality: Left;    FAMILY HISTORY Family History  Problem Relation Age  of Onset  . Cancer Maternal Grandmother     BREAST AND UTERINE CANCER  . Uterine cancer Maternal Grandmother 84  . Hyperlipidemia Mother   . Hypertension Mother   . Multiple myeloma Father 7  . Lung cancer Maternal Development worker, international aid  . Testicular cancer Other 19  The patient's father is living, currently age 79. He has a history of multiple myeloma. The patient's mother is 38. The  patient's parents live in [place] The patient had no brothers, 2 sisters. There is no history of breast or ovarian cancer in the family. She believes one of her grandfathers died from lung cancer and another from pancreatic cancer, but is not sure  GYNECOLOGIC HISTORY:  Menarche age 12, first live birth age 73, which the patient understands is an independent risk factor for breast cancer. She is GX P3. She was still having regular periods at the start of chemotherapy. LMP was March 2015. [Her migraines tend to occur right around the time of her menses]. 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 Her husband [name] is a Energy manager at [employer]. Her 3 children are [name] (14), [name] (10) and [name] (7). The patient attends a local [employer]: Not in place   HEALTH MAINTENANCE: History  Substance Use Topics  . Smoking status: Never Smoker   . Smokeless tobacco: Never Used  . Alcohol Use: No     Colonoscopy: January 2015  CZY:SAYTKZSW 2014  Bone density:  Lipid panel:  Allergies  Allergen Reactions  . Dust Mite Extract Itching    Current Outpatient Prescriptions  Medication Sig Dispense Refill  . ALPRAZolam (XANAX) 0.5 MG tablet Take 1 tablet (0.5 mg total) by mouth 2 (two) times daily as needed for anxiety. 10 tablet 0  . atorvastatin (LIPITOR) 20 MG tablet Take 20 mg by mouth daily.    . cetirizine (ZYRTEC) 10 MG tablet Take 10 mg by mouth daily.    . FROVA 2.5 MG tablet Take 2.5 mg by mouth daily as needed for migraine.     . hyaluronate sodium (RADIAPLEXRX) GEL Apply 1 application topically 2 (two) times daily.    . hydrOXYzine (ATARAX/VISTARIL) 10 MG tablet     . levocetirizine (XYZAL) 5 MG tablet     . lidocaine-prilocaine (EMLA) cream Apply 1 application topically as needed. Apply over port site 1-2 hours before chemotherapy and cover with plastic wrap 30 g 0   . LORazepam (ATIVAN) 0.5 MG tablet Take 1 tablet (0.5 mg total) by mouth at bedtime as needed (Nausea or vomiting). 30 tablet 0  . mometasone (NASONEX) 50 MCG/ACT nasal spray Place 2 sprays into the nose daily as needed (for allergies).     Marland Kitchen omeprazole (PRILOSEC) 20 MG capsule Take 1 capsule (20 mg total) by mouth 2 (two) times daily before a meal. 60 capsule 3  . oxymetazoline (AFRIN) 0.05 % nasal spray Place 2 sprays into the nose daily as needed. congestion    . PARoxetine (PAXIL) 20 MG tablet Take 20 mg by mouth every morning.    . prochlorperazine (COMPAZINE) 10 MG tablet Take 1 tablet (10 mg total) by mouth every 6 (six) hours as needed for nausea or vomiting. 30 tablet 0  . RELPAX 40 MG tablet Take 40 mg by mouth every 2 (two) hours as needed for migraine.     . Vitamin D, Ergocalciferol, (  DRISDOL) 50000 UNITS CAPS capsule Take 50,000 Units by mouth every 7 (seven) days.      No current facility-administered medications for this visit.    OBJECTIVE: Middle-aged white woman who appears stated age 57 Vitals:   04/03/14 1437  BP: 130/72  Pulse: 79  Temp: 98.2 F (36.8 C)  Resp: 20     Body mass index is 34.64 kg/(m^2).    ECOG FS:1 - Symptomatic but completely ambulatory  Sclerae unicteric, EOMs intact Oropharynx clear, teeth in good repair No cervical or supraclavicular adenopathy Lungs no rales or rhonchi Heart regular rate and rhythm Abd soft, obese, nontender, positive bowel sounds MSK no focal spinal tenderness, no upper extremity lymphedema Neuro: nonfocal, well oriented, positive affect Breasts: the left breast is status post lumpectomy and radiation. There is mild dry desquamation. There is erythema over the radiation port. There are no palpable masses. The left axilla is benign.the right breast is unremarkable.  LAB RESULTS:  CMP     Component Value Date/Time   NA 141 11/16/2013 1335   K 3.9 11/16/2013 1335   CO2 23 11/16/2013 1335   GLUCOSE 150* 11/16/2013  1335   BUN 7.6 11/16/2013 1335   CREATININE 0.8 11/16/2013 1335   CALCIUM 9.7 11/16/2013 1335   PROT 7.1 11/16/2013 1335   ALBUMIN 4.2 11/16/2013 1335   AST 35* 11/16/2013 1335   ALT 69* 11/16/2013 1335   ALKPHOS 190* 11/16/2013 1335   BILITOT 0.47 11/16/2013 1335    I No results found for: SPEP  Lab Results  Component Value Date   WBC 6.3 04/03/2014   NEUTROABS 4.6 04/03/2014   HGB 13.0 04/03/2014   HCT 39.5 04/03/2014   MCV 77.9* 04/03/2014   PLT 309 04/03/2014      Chemistry      Component Value Date/Time   NA 141 11/16/2013 1335   K 3.9 11/16/2013 1335   CO2 23 11/16/2013 1335   BUN 7.6 11/16/2013 1335   CREATININE 0.8 11/16/2013 1335      Component Value Date/Time   CALCIUM 9.7 11/16/2013 1335   ALKPHOS 190* 11/16/2013 1335   AST 35* 11/16/2013 1335   ALT 69* 11/16/2013 1335   BILITOT 0.47 11/16/2013 1335       No results found for: LABCA2  No components found for: LABCA125  No results for input(s): INR in the last 168 hours.  Urinalysis    Component Value Date/Time   BILIRUBINUR Negative 09/22/2011 1451   PROTEINUR Trace 09/22/2011 1451   UROBILINOGEN negative 09/22/2011 1451   NITRITE Negative 09/22/2011 1451   LEUKOCYTESUR Trace 09/22/2011 1451    STUDIES: No results found.  ASSESSMENT: 48 y.o. [place] woman status post left breast and left axillary lymph node biopsy 06/30/2013 for a clinical T2 N1-2, stage IIB/IIIA invasive ductal carcinoma, grade 2, estrogen and progesterone receptors both strongly positive, HER-2 nonamplified, with an MIB-1 of 36%.  (1) there is a 5 mm left internal mammary lymph node which was negative on PET scan  (2) cyclophosphamide and doxorubicin started 08/04/2014, given in dose dense fashion x4 with Neulasta day 2; completed 09/14/2013, followed by paclitaxel again given in dose dense fashion x4, completed 11/09/2013  (3) left lumpectomy and axillary lymph node dissection 12/13/2013 at [facility] showed a residual  pT2 pN1 (mic), stage IIB invasive ductal carcinoma; margins positive for DCIS cleared with subsequent surgery (01/03/2014)  (4) adjuvant radiation therapy completed 04/03/2014  (5) tamoxifen to start 04/27/2014  PLAN: I spent approximately one hour today  with [name] going over her treatment history so far. She did well with her radiation and is now ready to discuss anti-estrogens. We went over the difference between tamoxifen and the aromatase inhibitors and she has a good understanding of the possible toxicities, side effects and complications of these agents.  We also discussed menopausal symptoms and she should be able to distinguish symptoms caused by menopause, such as insomnia, weight gain, and mood alterations, from symptoms caused by tamoxifen, such as worsening of hot flashes. She understands also that anti-estrogens are the most effective agents available in terms of cutting down the risk of breast cancer recurrence, working both locally and systemically to prevent a recurrence and also cutting in half the risk of a new breast cancer developing in either breast.  She also understands that her periods may recur, and if they do she will need to undergo bilateral salpingo-oophorectomy.  She will need a little time to fully recover from her radiation treatments. Accordingly I think a good time to start the tamoxifen will be right after Thanksgiving's. He can sit takes a few months for the final blood level of tamoxifen to be achieved I will see her again in February. If she is tolerating tamoxifen well at that time we will start seeing her on an every 3 month basis.  I am setting her up to see Dr. Barry Dienes in the next week or 2 to remove the port. If she sees me in February then she would see Dr. Barry Dienes again in May.  Darriana understands that we do not do scans in the absence of specific symptoms to evaluate, as these increase the false-positive rate, expose the patient to radiation as well as  unnecessary costs, and have not been shown to improve survival. We did discuss the possibility of obtaining scans at the end of the 2 year follow-up counting from her surgery. That of course is the most dangerous two-year period and a negative scan at that point may be more informative.  The patient has a good understanding of the overall plan. She agrees with it. She knows the goal of treatment in her case is cure. She will call with any problems that may develop before her next visit here.     04/03/2014 2:54 PM

## 2014-04-04 NOTE — Addendum Note (Signed)
Addended by: Laureen Abrahams on: 04/04/2014 06:00 PM   Modules accepted: Orders, Medications

## 2014-04-07 NOTE — Progress Notes (Signed)
Name: Debbie [name]   MRN: [id number]  Date:  03/22/14   DOB: Mar 19, 1966  Status:outpatient    DIAGNOSIS: Left breast cancer  CONSENT VERIFIED: yes   SET UP: Patient is setup supine   IMMOBILIZATION:  The following immobilization was used:Custom Moldable Pillow, breast board.   NARRATIVE: [name] underwent complex simulation and treatment planning for her boost treatment today.  Her tumor volume was outlined on the planning CT scan. The depth of her cavity was felt to be appropriate for treatment with electrons    12  MeV electrons will be prescribed to the 100% isodose line.   I personally oversaw and approved the construction of a unique block which will be used for beam modification purposes.  A special port plan is requested.

## 2014-04-30 ENCOUNTER — Other Ambulatory Visit (INDEPENDENT_AMBULATORY_CARE_PROVIDER_SITE_OTHER): Payer: Self-pay | Admitting: General Surgery

## 2014-05-07 ENCOUNTER — Other Ambulatory Visit: Payer: Self-pay | Admitting: *Deleted

## 2014-05-07 MED ORDER — TAMOXIFEN CITRATE 20 MG PO TABS: 20.0000 mg | ORAL_TABLET | Freq: Every day | ORAL | Status: DC

## 2014-05-11 ENCOUNTER — Ambulatory Visit
Admission: RE | Admit: 2014-05-11 | Discharge: 2014-05-11 | Disposition: A | Discharge status: Home / Self Care | Payer: BC Managed Care – PPO | Source: Ambulatory Visit | Attending: Radiation Oncology | Admitting: Radiation Oncology

## 2014-05-11 ENCOUNTER — Encounter (HOSPITAL_BASED_OUTPATIENT_CLINIC_OR_DEPARTMENT_OTHER): Payer: Self-pay | Admitting: *Deleted

## 2014-05-11 ENCOUNTER — Encounter: Payer: Self-pay | Admitting: *Deleted

## 2014-05-11 VITALS — BP 140/73 | HR 88 | Temp 98.3°F | Wt 204.7 lb

## 2014-05-11 DIAGNOSIS — C50312 Malignant neoplasm of lower-inner quadrant of left female breast: Secondary | ICD-10-CM

## 2014-05-11 NOTE — Progress Notes (Addendum)
Routine one month follow up radiation to left breast.Mild tanning of skin.No longer applies radiaplex , informed she may apply lotion with vitamin e if needed.Denies pain.started tamoxifen Monday 05/07/14.Will start [facility] first of year.Given [employer] information.

## 2014-05-11 NOTE — [facility] Oncology Navigator Note (Signed)
Patient at [facility] for follow up visit with Dr. Pablo Ledger.  Patient reports that she is doing well and feeling good.  Her energy level is improving but is not back to her pre-treatment level.  She denies any questions or concerns at this time.  I encouraged her to call me for any needs.

## 2014-05-11 NOTE — Progress Notes (Signed)
No labs needed

## 2014-05-14 ENCOUNTER — Telehealth: Payer: Self-pay | Admitting: *Deleted

## 2014-05-14 NOTE — Progress Notes (Signed)
   Department of Radiation Oncology  Phone:  [phone] Fax:        [phone]   Name: Debbie [name] MRN: [id number]  DOB: 06-Nov-1965  Date: 05/11/2014  Follow Up Visit Note  Diagnosis: Cancer of lower-inner quadrant of left female breast   Staging form: Breast, AJCC 7th Edition     Clinical: Stage IIB (T2, N1, cM0) - Unsigned       Staging comments: Staged at breast conference 07/12/13.  Summary and Interval since last radiation: 85 Gray to the breast and 45 Gray to the PAB and SCLV   Interval History: Debbie [name] presents today for routine followup.  She is feeling well. She started tamoxifen and is tolerating that well. She is scheduled for port removal. She is interested in meeting with our dietician and also attending [facility].   Physical Exam:  Filed Vitals:   05/11/14 1615  BP: 140/73  Pulse: 88  Temp: 98.3 F (36.8 C)  Weight: 204 lb 11.2 oz (92.851 kg)   mild taninng of the left breast (worse in inframammary fold. Some edema.   IMPRESSION: Debbie [name] is a 48 y.o. female s/p radiation for breast conservation with resolving acute effects of treatment.   PLAN:  She is doing well. We discussed the need for follow up every 4-6 months which she has scheduled.  We discussed the need for yearly mammograms which she can schedule with her OBGYN or with medical oncology. We discussed the need for sun protection in the treated area.  She can always call me with questions.  I will follow up with her on an as needed basis.     Thea Silversmith, MD

## 2014-05-14 NOTE — Telephone Encounter (Signed)
CALLED PATIENT TO INFORM OF APPT. FOR NUTRITION WITH BARBARA NEFF ON 05-18-14 @ 12 PM, LVM FOR A RETURN CALL

## 2014-05-15 ENCOUNTER — Encounter (HOSPITAL_BASED_OUTPATIENT_CLINIC_OR_DEPARTMENT_OTHER)
Admission: RE | Disposition: A | Discharge status: Home / Self Care | Payer: Self-pay | Source: Ambulatory Visit | Attending: General Surgery

## 2014-05-15 ENCOUNTER — Ambulatory Visit (HOSPITAL_BASED_OUTPATIENT_CLINIC_OR_DEPARTMENT_OTHER): Payer: BC Managed Care – PPO | Admitting: Anesthesiology

## 2014-05-15 ENCOUNTER — Encounter (HOSPITAL_BASED_OUTPATIENT_CLINIC_OR_DEPARTMENT_OTHER): Payer: Self-pay | Admitting: Anesthesiology

## 2014-05-15 ENCOUNTER — Ambulatory Visit (HOSPITAL_BASED_OUTPATIENT_CLINIC_OR_DEPARTMENT_OTHER)
Admission: RE | Admit: 2014-05-15 | Discharge: 2014-05-15 | Disposition: A | Discharge status: Home / Self Care | Payer: BC Managed Care – PPO | Source: Ambulatory Visit | Attending: General Surgery | Admitting: General Surgery

## 2014-05-15 DIAGNOSIS — E780 Pure hypercholesterolemia: Secondary | ICD-10-CM | POA: Insufficient documentation

## 2014-05-15 DIAGNOSIS — C50912 Malignant neoplasm of unspecified site of left female breast: Secondary | ICD-10-CM | POA: Diagnosis present

## 2014-05-15 DIAGNOSIS — G43909 Migraine, unspecified, not intractable, without status migrainosus: Secondary | ICD-10-CM | POA: Diagnosis not present

## 2014-05-15 DIAGNOSIS — C50312 Malignant neoplasm of lower-inner quadrant of left female breast: Secondary | ICD-10-CM | POA: Insufficient documentation

## 2014-05-15 DIAGNOSIS — Z853 Personal history of malignant neoplasm of breast: Secondary | ICD-10-CM | POA: Diagnosis not present

## 2014-05-15 HISTORY — DX: Reserved for inherently not codable concepts without codable children: IMO0001

## 2014-05-15 HISTORY — PX: PORT-A-CATH REMOVAL: SHX5289

## 2014-05-15 HISTORY — DX: Reserved for concepts with insufficient information to code with codable children: IMO0002

## 2014-05-15 LAB — POCT HEMOGLOBIN-HEMACUE: Hemoglobin: 13.1 g/dL (ref 12.0–15.0)

## 2014-05-15 SURGERY — REMOVAL PORT-A-CATH
Anesthesia: Monitor Anesthesia Care

## 2014-05-15 MED ORDER — ACETAMINOPHEN 650 MG RE SUPP: 650.0000 mg | RECTAL | Status: DC | PRN

## 2014-05-15 MED ORDER — MIDAZOLAM HCL 2 MG/2ML IJ SOLN
INTRAMUSCULAR | Status: AC
  Filled 2014-05-15: qty 2

## 2014-05-15 MED ORDER — LACTATED RINGERS IV SOLN
INTRAVENOUS | Status: DC
  Administered 2014-05-15 (×2): via INTRAVENOUS

## 2014-05-15 MED ORDER — MIDAZOLAM HCL 2 MG/2ML IJ SOLN: 1.0000 mg | INTRAMUSCULAR | Status: DC | PRN

## 2014-05-15 MED ORDER — BUPIVACAINE HCL (PF) 0.25 % IJ SOLN
INTRAMUSCULAR | Status: DC | PRN
  Administered 2014-05-15: 1.25 mL

## 2014-05-15 MED ORDER — SODIUM CHLORIDE 0.9 % IJ SOLN: 3.0000 mL | Freq: Two times a day (BID) | INTRAMUSCULAR | Status: DC

## 2014-05-15 MED ORDER — OXYCODONE HCL 5 MG PO TABS: 5.0000 mg | ORAL_TABLET | ORAL | Status: DC | PRN

## 2014-05-15 MED ORDER — PROPOFOL 10 MG/ML IV BOLUS
INTRAVENOUS | Status: DC | PRN
  Administered 2014-05-15 (×2): 30 mg via INTRAVENOUS
  Administered 2014-05-15: 20 mg via INTRAVENOUS
  Administered 2014-05-15: 30 mg via INTRAVENOUS

## 2014-05-15 MED ORDER — FENTANYL CITRATE 0.05 MG/ML IJ SOLN
INTRAMUSCULAR | Status: DC | PRN
  Administered 2014-05-15 (×2): 50 ug via INTRAVENOUS

## 2014-05-15 MED ORDER — FENTANYL CITRATE 0.05 MG/ML IJ SOLN: 50.0000 ug | INTRAMUSCULAR | Status: DC | PRN

## 2014-05-15 MED ORDER — FENTANYL CITRATE 0.05 MG/ML IJ SOLN
INTRAMUSCULAR | Status: AC
  Filled 2014-05-15: qty 6

## 2014-05-15 MED ORDER — LIDOCAINE-EPINEPHRINE (PF) 1 %-1:200000 IJ SOLN
INTRAMUSCULAR | Status: DC | PRN
  Administered 2014-05-15: 1.25 mL

## 2014-05-15 MED ORDER — BUPIVACAINE HCL (PF) 0.25 % IJ SOLN
INTRAMUSCULAR | Status: AC
  Filled 2014-05-15: qty 120

## 2014-05-15 MED ORDER — CEFAZOLIN SODIUM-DEXTROSE 2-3 GM-% IV SOLR: 2.0000 g | INTRAVENOUS | Status: DC

## 2014-05-15 MED ORDER — BUPIVACAINE-EPINEPHRINE (PF) 0.5% -1:200000 IJ SOLN
INTRAMUSCULAR | Status: AC
  Filled 2014-05-15: qty 30

## 2014-05-15 MED ORDER — ACETAMINOPHEN 325 MG PO TABS: 650.0000 mg | ORAL_TABLET | ORAL | Status: DC | PRN

## 2014-05-15 MED ORDER — HYDROCODONE-ACETAMINOPHEN 5-325 MG PO TABS: 1.0000 | ORAL_TABLET | ORAL | Status: DC | PRN

## 2014-05-15 MED ORDER — FENTANYL CITRATE 0.05 MG/ML IJ SOLN: 25.0000 ug | INTRAMUSCULAR | Status: DC | PRN

## 2014-05-15 MED ORDER — LIDOCAINE-EPINEPHRINE (PF) 1 %-1:200000 IJ SOLN
INTRAMUSCULAR | Status: AC
  Filled 2014-05-15: qty 10

## 2014-05-15 MED ORDER — MIDAZOLAM HCL 5 MG/5ML IJ SOLN
INTRAMUSCULAR | Status: DC | PRN
  Administered 2014-05-15: 2 mg via INTRAVENOUS

## 2014-05-15 MED ORDER — SODIUM CHLORIDE 0.9 % IJ SOLN: 3.0000 mL | INTRAMUSCULAR | Status: DC | PRN

## 2014-05-15 MED ORDER — SODIUM CHLORIDE 0.9 % IV SOLN: 250.0000 mL | INTRAVENOUS | Status: DC | PRN

## 2014-05-15 SURGICAL SUPPLY — 32 items
BLADE HEX COATED 2.75 (ELECTRODE) ×3 IMPLANT
BLADE SURG 15 STRL LF DISP TIS (BLADE) ×1 IMPLANT
BLADE SURG 15 STRL SS (BLADE) ×2
CANISTER SUCT 1200ML W/VALVE (MISCELLANEOUS) IMPLANT
CHLORAPREP W/TINT 26ML (MISCELLANEOUS) ×3 IMPLANT
COVER BACK TABLE 60X90IN (DRAPES) ×3 IMPLANT
COVER MAYO STAND STRL (DRAPES) ×3 IMPLANT
DECANTER SPIKE VIAL GLASS SM (MISCELLANEOUS) IMPLANT
DRAPE PED LAPAROTOMY (DRAPES) ×3 IMPLANT
DRAPE UTILITY XL STRL (DRAPES) ×3 IMPLANT
ELECT REM PT RETURN 9FT ADLT (ELECTROSURGICAL) ×3
ELECTRODE REM PT RTRN 9FT ADLT (ELECTROSURGICAL) ×1 IMPLANT
GLOVE BIO SURGEON STRL SZ 6 (GLOVE) ×3 IMPLANT
GLOVE BIOGEL PI IND STRL 6.5 (GLOVE) ×1 IMPLANT
GLOVE BIOGEL PI INDICATOR 6.5 (GLOVE) ×2
GOWN STRL REUS W/ TWL LRG LVL3 (GOWN DISPOSABLE) ×1 IMPLANT
GOWN STRL REUS W/TWL 2XL LVL3 (GOWN DISPOSABLE) ×3 IMPLANT
GOWN STRL REUS W/TWL LRG LVL3 (GOWN DISPOSABLE) ×2
LIQUID BAND (GAUZE/BANDAGES/DRESSINGS) ×3 IMPLANT
NEEDLE HYPO 25X1 1.5 SAFETY (NEEDLE) ×3 IMPLANT
NS IRRIG 1000ML POUR BTL (IV SOLUTION) IMPLANT
PACK BASIN DAY SURGERY FS (CUSTOM PROCEDURE TRAY) ×3 IMPLANT
PENCIL BUTTON HOLSTER BLD 10FT (ELECTRODE) ×3 IMPLANT
SUT MNCRL AB 4-0 PS2 18 (SUTURE) ×3 IMPLANT
SUT VIC AB 3-0 SH 27 (SUTURE) ×2
SUT VIC AB 3-0 SH 27X BRD (SUTURE) ×1 IMPLANT
SYR CONTROL 10ML LL (SYRINGE) ×3 IMPLANT
TOWEL OR 17X24 6PK STRL BLUE (TOWEL DISPOSABLE) ×3 IMPLANT
TOWEL OR NON WOVEN STRL DISP B (DISPOSABLE) ×3 IMPLANT
TUBE CONNECTING 20'X1/4 (TUBING)
TUBE CONNECTING 20X1/4 (TUBING) IMPLANT
YANKAUER SUCT BULB TIP NO VENT (SUCTIONS) IMPLANT

## 2014-05-15 NOTE — Discharge Instructions (Addendum)
[place] Office Phone Number [phone]   POST OP INSTRUCTIONS  Always review your discharge instruction sheet given to you by the facility where your surgery was performed.  IF YOU HAVE DISABILITY OR FAMILY LEAVE FORMS, YOU MUST BRING THEM TO THE OFFICE FOR PROCESSING.  DO NOT GIVE THEM TO YOUR DOCTOR.  1. A prescription for pain medication may be given to you upon discharge.  Take your pain medication as prescribed, if needed.  If narcotic pain medicine is not needed, then you may take acetaminophen (Tylenol) or ibuprofen (Advil) as needed. 2. Take your usually prescribed medications unless otherwise directed 3. If you need a refill on your pain medication, please contact your pharmacy.  They will contact our office to request authorization.  Prescriptions will not be filled after 5pm or on week-ends. 4. You should eat very light the first 24 hours after surgery, such as soup, crackers, pudding, etc.  Resume your normal diet the day after surgery 5. It is common to experience some constipation if taking pain medication after surgery.  Increasing fluid intake and taking a stool softener will usually help or prevent this problem from occurring.  A mild laxative (Milk of Magnesia or Miralax) should be taken according to package directions if there are no bowel movements after 48 hours. 6. You may shower in 48 hours.  The surgical glue will flake off in 2-3 weeks.   7. ACTIVITIES:  No strenuous activity or heavy lifting for 1 week.   a. You may drive when you no longer are taking prescription pain medication, you can comfortably wear a seatbelt, and you can safely maneuver your car and apply brakes. b. RETURN TO WORK:  __________as tolerated as long as no lifting is done for 1 week__________ Dennis Bast should see your doctor in the office for a follow-up appointment approximately three-four weeks after your surgery.    WHEN TO CALL YOUR DOCTOR: 1. Fever over 101.0 2. Nausea and/or  vomiting. 3. Extreme swelling or bruising. 4. Continued bleeding from incision. 5. Increased pain, redness, or drainage from the incision.  The clinic staff is available to answer your questions during regular business hours.  Please dont hesitate to call and ask to speak to one of the nurses for clinical concerns.  If you have a medical emergency, go to the nearest emergency room or call 911.  A surgeon from [facility] is always on call at the hospital.  For further questions, please visit [email or web address]     Post Anesthesia Home Care Instructions  Activity: Get plenty of rest for the remainder of the day. A responsible adult should stay with you for 24 hours following the procedure.  For the next 24 hours, DO NOT: -Drive a car -Paediatric nurse -Drink alcoholic beverages -Take any medication unless instructed by your physician -Make any legal decisions or sign important papers.  Meals: Start with liquid foods such as gelatin or soup. Progress to regular foods as tolerated. Avoid greasy, spicy, heavy foods. If nausea and/or vomiting occur, drink only clear liquids until the nausea and/or vomiting subsides. Call your physician if vomiting continues.  Special Instructions/Symptoms: Your throat may feel dry or sore from the anesthesia or the breathing tube placed in your throat during surgery. If this causes discomfort, gargle with warm salt water. The discomfort should disappear within 24 hours.

## 2014-05-15 NOTE — Anesthesia Postprocedure Evaluation (Signed)
  Anesthesia Post-op Note  Patient: Debbie [name]  Procedure(s) Performed: Procedure(s): REMOVAL PORT-A-CATH (N/A)  Patient Location: PACU  Anesthesia Type: MAC  Level of Consciousness: awake and alert   Airway and Oxygen Therapy: Patient Spontanous Breathing  Post-op Pain: none  Post-op Assessment: Post-op Vital signs reviewed, Patient's Cardiovascular Status Stable and Respiratory Function Stable  Post-op Vital Signs: Reviewed  Filed Vitals:   05/15/14 1215  BP: 125/77  Pulse: 71  Temp:   Resp: 15    Complications: No apparent anesthesia complications

## 2014-05-15 NOTE — Anesthesia Procedure Notes (Signed)
Procedure Name: MAC Date/Time: 05/15/2014 11:17 AM Performed by: Marrianne Mood Pre-anesthesia Checklist: Patient identified, Timeout performed, Emergency Drugs available, Suction available and Patient being monitored Patient Re-evaluated:Patient Re-evaluated prior to inductionOxygen Delivery Method: Simple face mask

## 2014-05-15 NOTE — Anesthesia Preprocedure Evaluation (Addendum)
Anesthesia Evaluation  Patient identified by MRN, date of birth, ID band Patient awake    Reviewed: Allergy & Precautions, H&P , NPO status , Patient's Chart, lab work & pertinent test results  Airway Mallampati: III  TM Distance: >3 FB Neck ROM: Full    Dental no notable dental hx. (+) Teeth Intact, Dental Advisory Given   Pulmonary neg pulmonary ROS,  breath sounds clear to auscultation  Pulmonary exam normal       Cardiovascular negative cardio ROS  Rhythm:Regular Rate:Normal     Neuro/Psych  Headaches, Anxiety    GI/Hepatic negative GI ROS, Neg liver ROS,   Endo/Other  negative endocrine ROS  Renal/GU negative Renal ROS  negative genitourinary   Musculoskeletal   Abdominal   Peds  Hematology negative hematology ROS (+)   Anesthesia Other Findings   Reproductive/Obstetrics negative OB ROS                            Anesthesia Physical Anesthesia Plan  ASA: II  Anesthesia Plan: MAC   Post-op Pain Management:    Induction: Intravenous  Airway Management Planned: Simple Face Mask  Additional Equipment:   Intra-op Plan:   Post-operative Plan:   Informed Consent: I have reviewed the patients History and Physical, chart, labs and discussed the procedure including the risks, benefits and alternatives for the proposed anesthesia with the patient or authorized representative who has indicated his/her understanding and acceptance.   Dental advisory given  Plan Discussed with: CRNA  Anesthesia Plan Comments:         Anesthesia Quick Evaluation

## 2014-05-15 NOTE — Transfer of Care (Signed)
Immediate Anesthesia Transfer of Care Note  Patient: Debbie [name]  Procedure(s) Performed: Procedure(s): REMOVAL PORT-A-CATH (N/A)  Patient Location: PACU  Anesthesia Type:MAC  Level of Consciousness: awake, alert , oriented and patient cooperative  Airway & Oxygen Therapy: Patient Spontanous Breathing and Patient connected to face mask oxygen  Post-op Assessment: Report given to PACU RN and Post -op Vital signs reviewed and stable  Post vital signs: Reviewed and stable  Complications: No apparent anesthesia complications

## 2014-05-15 NOTE — Op Note (Signed)
  PRE-OPERATIVE DIAGNOSIS:  un-needed Port-A-Cath for left breast cancer  POST-OPERATIVE DIAGNOSIS:  Same   PROCEDURE:  Procedure(s):  REMOVAL PORT-A-CATH  SURGEON:  Surgeon(s):  [name], MD  ANESTHESIA:   MAC+ local  EBL:   Minimal  SPECIMEN:  None  Complications : none known  Procedure:   Pt was  identified in the holding area and taken to the operating room where she was placed supine on the operating room table.  MAC anesthesia was induced. The L upper chest was prepped and draped.  The prior incision was anesthetized with local anesthetic.  The incision was opened with a #15 blade.  The subcutaneous tissue was divided with the cautery.  The port was identified and the capsule opened.  The 4 2-0 prolene sutures were removed.  The port was then removed and pressure held on the tract.  The catheter appeared intact without evidence of breakage.  The wound was inspected for hemostasis, which was achieved with cautery.  The wound was closed with 3-0 vicryl deep dermal interrupted sutures and 4-0 Monocryl running subcuticular suture.  The wound was cleaned, dried, and dressed with dermabond.  The patient was awakened from anesthesia and taken to the PACU in stable condition.  Needle, sponge, and instrument counts are correct.

## 2014-05-15 NOTE — H&P (Signed)
Debbie [name] 04/30/2014 2:31 PM Location: [facility] Patient #: [id number] DOB: [birth year] Married / Language: English / Race: White Female  History of Present Illness Debbie [name]; 04/30/2014 3:50 PM) Patient words: discuss pac removal.  The patient is a 48 year old female who presents with breast cancer. Pt is a 48 yo F s/p neoadjuvant chemotherapy started march 2015. 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 She had takeback for margins 01/03/2014. She completed XRT as well 04/03/2014. She is done with treatment and is ready for port a cath removal. She is otherwise doing well. Her hair has come back. She is not having breast pain. She has not yet started tamoxifen, but is due to start this this month.    Other Problems Debbie [name]; 04/30/2014 2:31 PM) Breast Cancer Hypercholesterolemia Migraine Headache  Past Surgical History Debbie [name]; 04/30/2014 2:31 PM) Breast Biopsy Left. Breast Mass; Local Excision Left. Oral Surgery  Diagnostic Studies History Debbie [name], Debbie [name]; 04/30/2014 2:31 PM) Colonoscopy within last year Mammogram within last year Pap Smear 1-5 years ago  Allergies Debbie [name], Debbie [name]; 04/30/2014 2:33 PM) Dust Mite Extract *BIOLOGICALS MISC*  Medication History Debbie [name], [name]; 04/30/2014 2:33 PM) LORazepam (0.5MG  Tablet, Oral) Active. HydrOXYzine HCl (10MG  Tablet, Oral) Active. MetroNIDAZOLE (0.75% Cream, External) Active. Levocetirizine Dihydrochloride (5MG  Tablet, Oral) Active. PARoxetine HCl (20MG  Tablet, Oral) Active. Relpax (40MG  Tablet, Oral) Active. Vitamin D (Ergocalciferol) (50000UNIT Capsule, Oral) Active.  Social History (Debbie [name]; 04/30/2014 2:31 PM) Caffeine use Carbonated beverages. No alcohol use No drug use Tobacco use Never smoker.  Family History Debbie [name], [place]; 04/30/2014 2:31 PM) Arthritis Family Members In General. Cancer  Debbie [name].  Pregnancy / Birth History Debbie [name], [place]; 04/30/2014 2:31 PM) Age at menarche 89 years. Contraceptive History Oral contraceptives. Gravida 4 Irregular periods Maternal age 65-35 Para 3  Review of Systems (Debbie [name]; 04/30/2014 2:31 PM) General Present- Fatigue. Not Present- Appetite Loss, Chills, Fever, Night Sweats, Weight Gain and Weight Loss. Skin Not Present- Change in Wart/Mole, Dryness, Hives, Jaundice, New Lesions, Non-Healing Wounds, Rash and Ulcer. HEENT Present- Seasonal Allergies. Not Present- Earache, Hearing Loss, Hoarseness, Nose Bleed, Oral Ulcers, Ringing in the Ears, Sinus Pain, Sore Throat, Visual Disturbances, Wears glasses/contact lenses and Yellow Eyes. Respiratory Not Present- Bloody sputum, Chronic Cough, Difficulty Breathing, Snoring and Wheezing. Breast Not Present- Breast Mass, Breast Pain, Nipple Discharge and Skin Changes. Cardiovascular Not Present- Chest Pain, Difficulty Breathing Lying Down, Leg Cramps, Palpitations, Rapid Heart Rate, Shortness of Breath and Swelling of Extremities. Gastrointestinal Present- Constipation. Not Present- Abdominal Pain, Bloating, Bloody Stool, Change in Bowel Habits, Chronic diarrhea, Difficulty Swallowing, Excessive gas, Gets full quickly at meals, Hemorrhoids, Indigestion, Nausea, Rectal Pain and Vomiting. Female Genitourinary Not Present- Frequency, Nocturia, Painful Urination, Pelvic Pain and Urgency. Musculoskeletal Not Present- Back Pain, Joint Pain, Joint Stiffness, Muscle Pain, Muscle Weakness and Swelling of Extremities. Neurological Present- Headaches. Not Present- Decreased Memory, Fainting, Numbness, Seizures, Tingling, Tremor, Trouble walking and Weakness. Psychiatric Not Present- Anxiety, Bipolar, Change in Sleep Pattern, Depression, Fearful and Frequent crying. Endocrine Present- Hot flashes. Not Present- Cold Intolerance, Excessive Hunger, Hair Changes, Heat Intolerance and New  Diabetes. Hematology Not Present- Easy Bruising, Excessive bleeding, Gland problems, HIV and Persistent Infections.   Vitals (Debbie [name] [name]; 04/30/2014 2:32 PM) 04/30/2014 2:32 PM Weight: 208 lb Height: 64in Body Surface Area: 2.06 m Body Mass Index: 35.7 kg/m Temp.: 1F(Temporal)  Pulse: 78 (Regular)  BP: 130/76 (Sitting, Left Arm, Standard)    Physical Exam Debbie [name]; 04/30/2014 3:50 PM) General Mental Status-Alert. General Appearance-Consistent with stated age. Hydration-Well hydrated. Voice-Normal.  Head and Neck Head-normocephalic, atraumatic with no lesions or palpable masses.  Eye Sclera/Conjunctiva - Bilateral-No scleral icterus.  Chest and Lung Exam Chest and lung exam reveals -quiet, even and easy respiratory effort with no use of accessory muscles. Inspection Chest Wall - Normal. Back - normal. Note: left subclavian port incision well healed.   Breast - Did not examine.  Cardiovascular Cardiovascular examination reveals -normal pedal pulses bilaterally. Note: regular rate and rhythm  Abdomen Inspection-Inspection Normal. Palpation/Percussion Palpation and Percussion of the abdomen reveal - Soft, Non Tender, No Rebound tenderness, No Rigidity (guarding) and No hepatosplenomegaly. Auscultation Auscultation of the abdomen reveals - Bowel sounds normal.  Peripheral Vascular Upper Extremity Inspection - Bilateral - Normal - No Clubbing, No Cyanosis, No Edema, Pulses Intact. Lower Extremity Palpation - Edema - Bilateral - No edema.  Neurologic Neurologic evaluation reveals -alert and oriented x 3 with no impairment of recent or remote memory. Mental Status-Normal.  Musculoskeletal Global Assessment -Note: no gross deformities.  Normal Exam - Left-Upper Extremity Strength Normal and Lower Extremity Strength Normal. Normal Exam - Right-Upper Extremity Strength Normal and Lower Extremity Strength  Normal.  Lymphatic Head & Neck  General Head & Neck Lymphatics: Bilateral - Description - Normal. Axillary  General Axillary Region: Bilateral - Description - Normal. Tenderness - Non Tender.    Assessment & Plan Debbie [name]; 04/30/2014 3:51 PM) PRIMARY CANCER OF LOWER INNER QUADRANT OF LEFT BREAST (174.3  C50.312) Impression: Will schedule port removal. Discussed risks of surgery, post op recovery and restrictions.  Follow up post op. Current Plans  Schedule for Surgery Instructions: main risks of surgery are bleeding, infection, damage to adjacent structures, pain. These are low risks.

## 2014-05-15 NOTE — Interval H&P Note (Signed)
History and Physical Interval Note:  05/15/2014 11:05 AM  Debbie [name]  has presented today for surgery, with the diagnosis of LEFT BREAST CANCER  The various methods of treatment have been discussed with the patient and family. After consideration of risks, benefits and other options for treatment, the patient has consented to  Procedure(s): REMOVAL PORT-A-CATH (N/A) as a surgical intervention .  The patient's history has been reviewed, patient examined, no change in status, stable for surgery.  I have reviewed the patient's chart and labs.  Questions were answered to the patient's satisfaction.     Shadae Reino

## 2014-05-16 ENCOUNTER — Encounter (HOSPITAL_BASED_OUTPATIENT_CLINIC_OR_DEPARTMENT_OTHER): Payer: Self-pay | Admitting: General Surgery

## 2014-05-18 ENCOUNTER — Encounter: Payer: BC Managed Care – PPO | Admitting: Nutrition

## 2014-05-18 ENCOUNTER — Encounter: Payer: Self-pay | Admitting: Nutrition

## 2014-05-18 NOTE — Progress Notes (Signed)
Patient did not show up for scheduled nutrition appointment. 

## 2014-07-12 ENCOUNTER — Other Ambulatory Visit (HOSPITAL_BASED_OUTPATIENT_CLINIC_OR_DEPARTMENT_OTHER): Payer: BLUE CROSS/BLUE SHIELD

## 2014-07-12 ENCOUNTER — Encounter: Payer: Self-pay | Admitting: *Deleted

## 2014-07-12 DIAGNOSIS — C50312 Malignant neoplasm of lower-inner quadrant of left female breast: Secondary | ICD-10-CM

## 2014-07-12 LAB — COMPREHENSIVE METABOLIC PANEL (CC13)
ALT: 95 U/L — ABNORMAL HIGH (ref 0–55)
AST: 56 U/L — ABNORMAL HIGH (ref 5–34)
Albumin: 4 g/dL (ref 3.5–5.0)
Alkaline Phosphatase: 123 U/L (ref 40–150)
Anion Gap: 10 mEq/L (ref 3–11)
BUN: 11.2 mg/dL (ref 7.0–26.0)
CO2: 22 mEq/L (ref 22–29)
Calcium: 9 mg/dL (ref 8.4–10.4)
Chloride: 110 mEq/L — ABNORMAL HIGH (ref 98–109)
Creatinine: 0.7 mg/dL (ref 0.6–1.1)
EGFR: >90 mL/min/{1.73_m2} (ref >90)
Glucose: 94 mg/dl (ref 70–140)
Potassium: 4.2 mEq/L (ref 3.5–5.1)
Sodium: 141 mEq/L (ref 136–145)
Total Bilirubin: 0.31 mg/dL (ref 0.20–1.20)
Total Protein: 6.8 g/dL (ref 6.4–8.3)

## 2014-07-12 LAB — CBC WITH DIFFERENTIAL/PLATELET
BASO%: 0.2 % (ref 0.0–2.0)
Basophils Absolute: 0 10*3/uL (ref 0.0–0.1)
EOS%: 6.3 % (ref 0.0–7.0)
Eosinophils Absolute: 0.4 10*3/uL (ref 0.0–0.5)
HCT: 38.2 % (ref 34.8–46.6)
HGB: 12.8 g/dL (ref 11.6–15.9)
LYMPH%: 21.5 % (ref 14.0–49.7)
MCH: 27.5 pg (ref 25.1–34.0)
MCHC: 33.5 g/dL (ref 31.5–36.0)
MCV: 82 fL (ref 79.5–101.0)
MONO#: 0.6 10*3/uL (ref 0.1–0.9)
MONO%: 9.8 % (ref 0.0–14.0)
NEUT#: 3.7 10*3/uL (ref 1.5–6.5)
NEUT%: 62.2 % (ref 38.4–76.8)
Platelets: 288 10*3/uL (ref 145–400)
RBC: 4.66 10*6/uL (ref 3.70–5.45)
RDW: 13.7 % (ref 11.2–14.5)
WBC: 6 10*3/uL (ref 3.9–10.3)
lymph#: 1.3 10*3/uL (ref 0.9–3.3)

## 2014-07-12 NOTE — [facility] Oncology Navigator Note (Signed)
Patient at [facility] for labs prior to her appointment next week.  She reports that she is doing well.  She has had her port removed since her last visit.  The site has healed well with a little firmness at the insertion site.  She has regained her energy level and is enjoying work.  She is interested in attending the [facility] classes.  I checked with Family Support services and added her to the list for the next series and told her that it will be in May and they will call her when the specific schedule is determined.  I encouraged her to call me if she has any questions or needs.

## 2014-07-19 ENCOUNTER — Telehealth: Payer: Self-pay | Admitting: Oncology

## 2014-07-19 ENCOUNTER — Ambulatory Visit (HOSPITAL_BASED_OUTPATIENT_CLINIC_OR_DEPARTMENT_OTHER): Payer: BLUE CROSS/BLUE SHIELD | Admitting: Oncology

## 2014-07-19 VITALS — BP 132/75 | HR 78 | Temp 98.4°F | Resp 18 | Wt 203.7 lb

## 2014-07-19 DIAGNOSIS — Z170 Estrogen receptor positive status [ER+]: Secondary | ICD-10-CM

## 2014-07-19 DIAGNOSIS — C50312 Malignant neoplasm of lower-inner quadrant of left female breast: Secondary | ICD-10-CM

## 2014-07-19 DIAGNOSIS — C773 Secondary and unspecified malignant neoplasm of axilla and upper limb lymph nodes: Secondary | ICD-10-CM

## 2014-07-19 DIAGNOSIS — K589 Irritable bowel syndrome without diarrhea: Secondary | ICD-10-CM

## 2014-07-19 DIAGNOSIS — I341 Nonrheumatic mitral (valve) prolapse: Secondary | ICD-10-CM

## 2014-07-19 DIAGNOSIS — E7800 Pure hypercholesterolemia, unspecified: Secondary | ICD-10-CM

## 2014-07-19 DIAGNOSIS — R748 Abnormal levels of other serum enzymes: Secondary | ICD-10-CM

## 2014-07-19 NOTE — Progress Notes (Signed)
[place]  [phone] [phone]     ID: [name] OB: 12-26-1965  MR#: [id number]  JYN#:[id number]  PCP: [name], MD GYN:  [name]: [name]; [name] OTHER MD: Cline Cools, [place]  CHIEF COMPLAINT: locally advanced breast cancer CURRENT TREATMENT:  tamoxifen  BREAST CANCER HISTORY: As per previously documented note:  Ayden had routine yearly screening mammography at the breast center 06/19/2013 showing a possible mass in the left breast. The right breast was unremarkable. On 06/30/2013 additional views of the left breast showed an irregular spiculated dense mass measuring 2.2 cm in the lower inner quadrant. This was palpable at the 8:00 location. Ultrasound showed an irregular hypoechoic mass measuring 2.3 cm and in the left axilla a dominant abnormal appearing lymph node with cortical thickening measuring 1.2 cm.  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  [id number] the patient underwent bilateral breast MRI. This showed her breasts to be dense (composition C.). In the left breast at the 8:00 position there was a 2.4 cm enhancing mass containing a biopsy clip. There were no additional areas of concern. In the axilla on the left there was a 2.6 cm enlarged left axillary lymph node. There was also a 0.5 cm left internal mammary lymph nodes noted. The right side was unremarkable.  The patient's subsequent history is as detailed below.  INTERVAL HISTORY: Jaylanie returns today for followup of her breast cancer. The  interval history is significant for her having had her port removed in December, with no complete location's. She then started tamoxifen the first week in December. She has had occasional hot flashes but nothing of consequence. She has a mild vaginal discharge, for which she wears a mini pad. She has noted no other side effects from the tamoxifen. She obtains it at less than $10 a month.   REVIEW OF SYSTEMS: S she belongs to [employer], but doesn't exercise regularly. She tells me she has flat feet and walking is a problem for her. She has not had any periods for a year and her gynecologist recently tested her and found her to be in the postmenopausal range. Aside from these issues, a detailed review of systems today was entirely benign  PAST MEDICAL HISTORY: Past Medical History  Diagnosis Date  . MVP (mitral valve prolapse)     echo 2/15-mitral valve normal-no regurg,no stenosis  . IBS (irritable bowel syndrome)   . Migraines     menstral  . Hyperlipidemia   . Migraines   . IBS (irritable bowel syndrome)   . Abnormal Pap smear   . Seasonal allergies   . Anxiety   . Menorrhagia   . Breast cancer 2015    ER+/PR+/Her2-  . Radiation 02/14/14-04/03/14    Left Breast    PAST SURGICAL HISTORY: Past Surgical History  Procedure Laterality Date  . Wisdom teeth removal    . Vulva /perineum biopsy  2011  . Svd       x 3  . Endometrial ablation    . Portacath placement Left 07/18/2013    Procedure: INSERTION PORT-A-CATH;  Surgeon: [name], MD;  Location: [facility];  Service: General;  Laterality: Left;  . Port-a-cath removal N/A 05/15/2014    Procedure: REMOVAL PORT-A-CATH;  Surgeon: [name], MD;  Location: [place];  Service: General;  Laterality: N/A;    FAMILY HISTORY Family History  Problem Relation Age of Onset  . Cancer Maternal Grandmother     BREAST AND UTERINE CANCER  . Uterine cancer Maternal Grandmother 84  . Hyperlipidemia Mother   . Hypertension Mother    . Multiple myeloma Father 65  . Lung cancer Maternal Development worker, international aid  . Testicular cancer Other 72  The patient's father is living, currently age 56. He has a history of multiple myeloma. The patient's mother is 47. The patient's parents live in [place] The patient had no brothers, 2 sisters. There is no history of breast or ovarian cancer in the family. She believes one of her grandfathers died from lung cancer and another from pancreatic cancer, but is not sure  GYNECOLOGIC HISTORY:  Menarche age 49, first live birth age 49, which the patient understands is an independent risk factor for breast cancer. She is GX P3. She was still having regular periods at the start of chemotherapy. LMP was March 2015. She tells me Dr. Cletis Media checked her labs December 2015 and found that she was in the menopausal range. 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 Her husband [name] is a Energy manager at [employer]. Her 3 children are [name] (48), [name] (48) and [name] (48) The patient attends a local [place]: Not in place   HEALTH MAINTENANCE: History  Substance Use Topics  . Smoking status: Never Smoker   . Smokeless tobacco: Never Used  . Alcohol Use: No     Colonoscopy: January 2015  QQI:[id number] 2014  Bone density:  Lipid panel:  Allergies  Allergen Reactions  . Dust Mite Extract Itching    Current Outpatient Prescriptions  Medication Sig Dispense Refill  . ALPRAZolam (XANAX) 0.5 MG tablet Take 1 tablet (0.5 mg total) by mouth 2 (two) times daily as needed for anxiety. 10 tablet 0  . atorvastatin (LIPITOR) 20 MG tablet Take 20 mg by mouth daily.    . cetirizine (ZYRTEC) 10 MG tablet Take 10 mg by mouth daily.    Marland Kitchen HYDROcodone-acetaminophen (NORCO/VICODIN) 5-325 MG per tablet Take 1-2 tablets by mouth every 4 (four) hours as needed for moderate pain or severe pain. 20  tablet 0  . levocetirizine (XYZAL) 5 MG tablet     . mometasone (NASONEX) 50 MCG/ACT nasal spray Place 2 sprays into the nose daily as needed (for allergies).     Marland Kitchen omeprazole (PRILOSEC) 20 MG capsule Take 1 capsule (20 mg total) by mouth 2 (two) times daily before a meal. 60 capsule 3  . oxymetazoline (AFRIN) 0.05 % nasal spray Place 2 sprays into the nose daily as needed. congestion    . PARoxetine (PAXIL) 20 MG tablet Take 20 mg by mouth every morning.    Marland Kitchen RELPAX 40 MG tablet Take 40 mg by mouth every 2 (two) hours as needed for migraine.     . tamoxifen (NOLVADEX) 20 MG tablet Take 1 tablet (20 mg total) by mouth daily. 30 tablet 12  . Vitamin D, Ergocalciferol, (DRISDOL) 50000 UNITS CAPS capsule Take 50,000 Units by mouth every 7 (seven) days.  No current facility-administered medications for this visit.    OBJECTIVE: Middle-aged white woman in no acute distress Filed Vitals:   07/19/14 1108  BP: 132/75  Pulse: 78  Temp: 98.4 F (36.9 C)  Resp: 18     Body mass index is 34.95 kg/(m^2).    ECOG FS:0 - Asymptomatic  Sclerae unicteric, pupils round and equal Oropharynx clear and moist No cervical or supraclavicular adenopathy Lungs no rales or rhonchi Heart regular rate and rhythm Abd soft, obese, nontender, with no organomegaly, positive bowel sounds MSK no focal spinal tenderness, no upper extremity lymphedema Neuro: nonfocal, well oriented, positive affect Breasts: the left breast is status post lumpectomy and radiation. There is no evidence of local recurrence. The left axilla is benign.. The right breast is unremarkable LAB RESULTS:  CMP     Component Value Date/Time   NA 141 07/12/2014 1033   K 4.2 07/12/2014 1033   CO2 22 07/12/2014 1033   GLUCOSE 94 07/12/2014 1033   BUN 11.2 07/12/2014 1033   CREATININE 0.7 07/12/2014 1033   CALCIUM 9.0 07/12/2014 1033   PROT 6.8 07/12/2014 1033   ALBUMIN 4.0 07/12/2014 1033   AST 56* 07/12/2014 1033   ALT 95* 07/12/2014  1033   ALKPHOS 123 07/12/2014 1033   BILITOT 0.31 07/12/2014 1033    I No results found for: SPEP  Lab Results  Component Value Date   WBC 6.0 07/12/2014   NEUTROABS 3.7 07/12/2014   HGB 12.8 07/12/2014   HCT 38.2 07/12/2014   MCV 82.0 07/12/2014   PLT 288 07/12/2014      Chemistry      Component Value Date/Time   NA 141 07/12/2014 1033   K 4.2 07/12/2014 1033   CO2 22 07/12/2014 1033   BUN 11.2 07/12/2014 1033   CREATININE 0.7 07/12/2014 1033      Component Value Date/Time   CALCIUM 9.0 07/12/2014 1033   ALKPHOS 123 07/12/2014 1033   AST 56* 07/12/2014 1033   ALT 95* 07/12/2014 1033   BILITOT 0.31 07/12/2014 1033       No results found for: LABCA2  No components found for: LABCA125  No results for input(s): INR in the last 168 hours.  Urinalysis    Component Value Date/Time   BILIRUBINUR Negative 09/22/2011 1451   PROTEINUR Trace 09/22/2011 1451   UROBILINOGEN negative 09/22/2011 1451   NITRITE Negative 09/22/2011 1451   LEUKOCYTESUR Trace 09/22/2011 1451    STUDIES: No results found.  ASSESSMENT: 49 y.o. [place] woman status post left breast and left axillary lymph node biopsy 06/30/2013 for a clinical T2 N1-2, stage IIB/IIIA invasive ductal carcinoma, grade 2, estrogen and progesterone receptors both strongly positive, HER-2 nonamplified, with an MIB-1 of 36%.  (1) there is a 5 mm left internal mammary lymph node which was negative on PET scan  (2) cyclophosphamide and doxorubicin started 08/04/2014, given in dose dense fashion x4 with Neulasta day 2; completed 09/14/2013, followed by paclitaxel again given in dose dense fashion x4, completed 11/09/2013  (3) left lumpectomy and axillary lymph node dissection 12/13/2013 at [facility] showed a residual pT2 pN1 (mic), stage IIB invasive ductal carcinoma; margins positive for DCIS cleared with subsequent surgery (01/03/2014)  (4) adjuvant radiation therapy completed 04/03/2014  (5) tamoxifen started  December 2015  PLAN: Obera is tolerating the tamoxifen well and the plan will be to continue that for a minimum of 2 years and then consider switching to an aromatase inhibitor.  Her transaminases are up a bit.  The ALT in particular has been moderately up and down in the past, and this most likely reflects steatosis. However we do need to establish that. We discussed getting an MRI but she is terrified of the enclosure. Where going to start with an ultrasound of the liver and she will call us for results. If that does not resolve the issue we will indeed proceed to an MRI and she has some Xanax she can take as a premed.  I have encouraged her to exercise for 45 minutes 5 times a week. She should be very persistent but not very intense at least not initially. If walking as a problem she can consider a stationary bike, or things like tai chi and yoga. I think she would get more energy if she exercised more. In addition of course if she does have steatosis diet and exercise are the way to go.  Wyndi has a good understanding of the overall plan. She agrees with it. She knows the goal of treatment in her case is cure. She will call with any problems that may develop before her next visit here.     07/19/2014 11:43 AM

## 2014-07-19 NOTE — Telephone Encounter (Signed)
per pof to sch pt appt-adv pt that [facility] will call to sch US-gave pt copy of sch

## 2014-07-19 NOTE — Addendum Note (Signed)
Addended by: Laureen Abrahams on: 07/19/2014 01:42 PM   Modules accepted: Orders, Medications

## 2014-08-01 ENCOUNTER — Ambulatory Visit (HOSPITAL_COMMUNITY): Payer: BLUE CROSS/BLUE SHIELD

## 2014-08-03 ENCOUNTER — Ambulatory Visit (HOSPITAL_COMMUNITY): Payer: BLUE CROSS/BLUE SHIELD

## 2014-08-07 ENCOUNTER — Ambulatory Visit (HOSPITAL_COMMUNITY)
Admission: RE | Admit: 2014-08-07 | Discharge: 2014-08-07 | Disposition: A | Discharge status: Home / Self Care | Payer: BLUE CROSS/BLUE SHIELD | Source: Ambulatory Visit | Attending: Oncology | Admitting: Oncology

## 2014-08-07 DIAGNOSIS — C50919 Malignant neoplasm of unspecified site of unspecified female breast: Secondary | ICD-10-CM | POA: Diagnosis not present

## 2014-08-07 DIAGNOSIS — E7800 Pure hypercholesterolemia, unspecified: Secondary | ICD-10-CM

## 2014-08-07 DIAGNOSIS — K589 Irritable bowel syndrome without diarrhea: Secondary | ICD-10-CM

## 2014-08-07 DIAGNOSIS — R7989 Other specified abnormal findings of blood chemistry: Secondary | ICD-10-CM | POA: Insufficient documentation

## 2014-08-07 DIAGNOSIS — C50312 Malignant neoplasm of lower-inner quadrant of left female breast: Secondary | ICD-10-CM

## 2014-08-07 DIAGNOSIS — K824 Cholesterolosis of gallbladder: Secondary | ICD-10-CM | POA: Diagnosis not present

## 2014-08-07 DIAGNOSIS — I341 Nonrheumatic mitral (valve) prolapse: Secondary | ICD-10-CM

## 2014-08-08 ENCOUNTER — Other Ambulatory Visit: Payer: Self-pay | Admitting: Emergency Medicine

## 2014-08-10 ENCOUNTER — Telehealth: Payer: Self-pay | Admitting: *Deleted

## 2014-08-10 NOTE — Telephone Encounter (Signed)
Patient called requesting someone call her with results before the weekend.  She said she was told someone would call her Wednesday and Thursday and no one has.

## 2014-08-13 ENCOUNTER — Other Ambulatory Visit: Payer: Self-pay | Admitting: Oncology

## 2014-09-07 ENCOUNTER — Telehealth: Payer: Self-pay

## 2014-09-07 NOTE — Telephone Encounter (Signed)
Pt called stating her period started this week, she has not had a period since Feb 2015. She stated Dr Jana Hakim wanted her to call if she started having her period again. She mentioned something about a partial hysterectomy, possibly. Forwarded to Dr Jana Hakim and RNs

## 2014-09-11 NOTE — Telephone Encounter (Signed)
This RN returned call to pt and obtained identified VM both on cell and home numbers.  Message left for to return call to this RN.  Need for additional data including last PAP exam and if pt is currently scheduled to see her GYN.

## 2014-09-13 ENCOUNTER — Other Ambulatory Visit: Payer: Self-pay | Admitting: Oncology

## 2014-09-13 NOTE — Telephone Encounter (Signed)
She needs to see her gYN--may be an endometrial polyp

## 2014-09-17 NOTE — Telephone Encounter (Signed)
This RN attempted to contact pt to follow up on prior call- obtained identified VM.  Detailed message left requesting her to return call and may speak with triage.  This RN requested information regarding onset and length of menses as well as pattern of bleeding.  Date of last gyn exam and probable need for recheck now to evaluate for endometrial thickening concerns while on tamoxifen.  If bleeding has stopped - advice is for Alleyne to see GYN and discuss above for evaluation. If bleeding has not stopped she is to STOP the tamoxifen and see GYN as above.  This RN name given for return call.

## 2014-09-18 ENCOUNTER — Telehealth: Payer: Self-pay | Admitting: *Deleted

## 2014-09-18 NOTE — Telephone Encounter (Signed)
This RN left message on identified VM for pt to inform her records received from Dr Cletis Media per GYN work up.

## 2014-09-27 ENCOUNTER — Other Ambulatory Visit: Payer: Self-pay | Admitting: Obstetrics and Gynecology

## 2014-09-27 ENCOUNTER — Other Ambulatory Visit: Payer: Self-pay | Admitting: Oncology

## 2014-10-17 ENCOUNTER — Other Ambulatory Visit: Payer: BLUE CROSS/BLUE SHIELD

## 2014-10-19 ENCOUNTER — Telehealth: Payer: Self-pay | Admitting: Oncology

## 2014-10-19 ENCOUNTER — Other Ambulatory Visit: Payer: BLUE CROSS/BLUE SHIELD

## 2014-10-19 NOTE — Telephone Encounter (Signed)
Returned Advertising account executive. Unable to leave a voicemail to reschedule lab per patient today she is not feeling well.

## 2014-10-22 ENCOUNTER — Other Ambulatory Visit (HOSPITAL_BASED_OUTPATIENT_CLINIC_OR_DEPARTMENT_OTHER): Payer: BLUE CROSS/BLUE SHIELD

## 2014-10-22 DIAGNOSIS — C773 Secondary and unspecified malignant neoplasm of axilla and upper limb lymph nodes: Secondary | ICD-10-CM

## 2014-10-22 DIAGNOSIS — C50812 Malignant neoplasm of overlapping sites of left female breast: Secondary | ICD-10-CM | POA: Diagnosis not present

## 2014-10-22 DIAGNOSIS — C50312 Malignant neoplasm of lower-inner quadrant of left female breast: Secondary | ICD-10-CM

## 2014-10-22 LAB — COMPREHENSIVE METABOLIC PANEL (CC13)
ALT: 93 U/L — ABNORMAL HIGH (ref 0–55)
AST: 60 U/L — ABNORMAL HIGH (ref 5–34)
Albumin: 3.7 g/dL (ref 3.5–5.0)
Alkaline Phosphatase: 90 U/L (ref 40–150)
Anion Gap: 12 mEq/L — ABNORMAL HIGH (ref 3–11)
BUN: 9.9 mg/dL (ref 7.0–26.0)
CO2: 23 mEq/L (ref 22–29)
Calcium: 8.2 mg/dL — ABNORMAL LOW (ref 8.4–10.4)
Chloride: 106 mEq/L (ref 98–109)
Creatinine: 0.7 mg/dL (ref 0.6–1.1)
EGFR: >90 mL/min/{1.73_m2} (ref >90)
Glucose: 84 mg/dl (ref 70–140)
Potassium: 3.9 mEq/L (ref 3.5–5.1)
Sodium: 141 mEq/L (ref 136–145)
Total Bilirubin: 0.24 mg/dL (ref 0.20–1.20)
Total Protein: 6.5 g/dL (ref 6.4–8.3)

## 2014-10-22 LAB — CBC WITH DIFFERENTIAL/PLATELET
BASO%: 1.1 % (ref 0.0–2.0)
Basophils Absolute: 0.1 10*3/uL (ref 0.0–0.1)
EOS%: 4.9 % (ref 0.0–7.0)
Eosinophils Absolute: 0.4 10*3/uL (ref 0.0–0.5)
HCT: 37.1 % (ref 34.8–46.6)
HGB: 12.4 g/dL (ref 11.6–15.9)
LYMPH%: 19.8 % (ref 14.0–49.7)
MCH: 27.9 pg (ref 25.1–34.0)
MCHC: 33.5 g/dL (ref 31.5–36.0)
MCV: 83.5 fL (ref 79.5–101.0)
MONO#: 1 10*3/uL — ABNORMAL HIGH (ref 0.1–0.9)
MONO%: 13 % (ref 0.0–14.0)
NEUT#: 4.8 10*3/uL (ref 1.5–6.5)
NEUT%: 61.2 % (ref 38.4–76.8)
Platelets: 286 10*3/uL (ref 145–400)
RBC: 4.44 10*6/uL (ref 3.70–5.45)
RDW: 13.3 % (ref 11.2–14.5)
WBC: 7.8 10*3/uL (ref 3.9–10.3)
lymph#: 1.6 10*3/uL (ref 0.9–3.3)

## 2014-10-24 ENCOUNTER — Encounter: Payer: Self-pay | Admitting: *Deleted

## 2014-10-24 ENCOUNTER — Ambulatory Visit (HOSPITAL_BASED_OUTPATIENT_CLINIC_OR_DEPARTMENT_OTHER): Payer: BLUE CROSS/BLUE SHIELD | Admitting: Oncology

## 2014-10-24 ENCOUNTER — Telehealth: Payer: Self-pay | Admitting: Oncology

## 2014-10-24 VITALS — BP 127/75 | HR 88 | Temp 97.9°F | Resp 18 | Ht 64.0 in | Wt 209.4 lb

## 2014-10-24 DIAGNOSIS — C773 Secondary and unspecified malignant neoplasm of axilla and upper limb lymph nodes: Secondary | ICD-10-CM

## 2014-10-24 DIAGNOSIS — C50312 Malignant neoplasm of lower-inner quadrant of left female breast: Secondary | ICD-10-CM

## 2014-10-24 DIAGNOSIS — C50812 Malignant neoplasm of overlapping sites of left female breast: Secondary | ICD-10-CM

## 2014-10-24 DIAGNOSIS — K7460 Unspecified cirrhosis of liver: Secondary | ICD-10-CM

## 2014-10-24 DIAGNOSIS — K760 Fatty (change of) liver, not elsewhere classified: Secondary | ICD-10-CM

## 2014-10-24 NOTE — Progress Notes (Signed)
Debbie [name]  Telephone:([phone]) [phone] Fax:(336) [phone]     ID: Debbie [name] OB: [id number]  MR#: [id number]  XIH#:[id number]  PCP: [name], MD GYN:  [name]: [name]; [name] OTHER MD: Cline Cools, [place]  CHIEF COMPLAINT: locally advanced breast cancer CURRENT TREATMENT:  tamoxifen  BREAST CANCER HISTORY: As per previously documented note:  [name] had routine yearly screening mammography at the breast center 06/19/2013 showing a possible mass in the left breast. The right breast was unremarkable. On 06/30/2013 additional views of the left breast showed an irregular spiculated dense mass measuring 2.2 cm in the lower inner quadrant. This was palpable at the 8:00 location. Ultrasound showed an irregular hypoechoic mass measuring 2.3 cm and in the left axilla a dominant abnormal appearing lymph node with cortical thickening measuring 1.2 cm.  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  [id number] the patient underwent bilateral breast MRI. This showed her breasts to be dense (composition C.). In the left breast at the 8:00 position there was a 2.4 cm enhancing mass containing a biopsy clip. There were no additional areas of concern. In the axilla on the left there was a 2.6 cm enlarged left axillary lymph node. There was also a 0.5 cm left internal mammary lymph nodes noted. The right side was unremarkable.  The patient's subsequent history is as detailed below.  INTERVAL HISTORY: Debbie [name] returns today for followup of her breast cancer. She  continues on tamoxifen, with good tolerance. She was having some hot flashes initially but those have resolved. She also was having some vaginal discharge and initially, which has resolved. Since she had her endometrial biopsy 09/27/2014 (benign, [id number]) she has not had a period. Recall that her husband is status post vasectomy.   REVIEW OF SYSTEMS: She is just starting to go [place] the [place] to exercise. She is very concerned about weight gain issues. A detailed review of systems today was otherwise entirely negative  PAST MEDICAL HISTORY: Past Medical History  Diagnosis Date  . MVP (mitral valve prolapse)     echo 2/15-mitral valve normal-no regurg,no stenosis  . IBS (irritable bowel syndrome)   . Migraines     menstral  . Hyperlipidemia   . Migraines   . IBS (irritable bowel syndrome)   . Abnormal Pap smear   . Seasonal allergies   . Anxiety   . Menorrhagia   . Breast cancer 2015    ER+/PR+/Her2-  . Radiation 02/14/14-04/03/14    Left Breast    PAST SURGICAL HISTORY: Past Surgical History  Procedure Laterality Date  . Wisdom teeth removal    . Vulva /perineum biopsy  2011  . Svd       x 3  . Endometrial ablation    . Portacath placement Left 07/18/2013    Procedure: INSERTION PORT-A-CATH;  Surgeon: [name], MD;  Location: [place];  Service: General;  Laterality: Left;  . Port-a-cath removal N/A 05/15/2014    Procedure: REMOVAL PORT-A-CATH;  Surgeon: [name], MD;  Location: [place];  Service: General;  Laterality: N/A;  FAMILY HISTORY Family History  Problem Relation Age of Onset  . Cancer Maternal Grandmother     BREAST AND UTERINE CANCER  . Uterine cancer Maternal Grandmother 84  . Hyperlipidemia Mother   . Hypertension Mother   . Multiple myeloma Father 34  . Lung cancer Maternal Development worker, international aid  . Testicular cancer Other 17  The patient's father is living, currently age 4. He has a history of multiple myeloma. The patient's  mother is 3. The patient's parents live in [place] The patient had no brothers, 2 sisters. There is no history of breast or ovarian cancer in the family. She believes one of her grandfathers died from lung cancer and another from pancreatic cancer, but is not sure  GYNECOLOGIC HISTORY:  Menarche age 18, first live birth age 35, which the patient understands is an independent risk factor for breast cancer. She is GX P3. She was still having regular periods at the start of chemotherapy. LMP was March 2015. She tells me Dr. Cletis Media checked her labs December 2015 and found that she was in the menopausal range. 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 Her husband [name] is a Energy manager at [employer]. Her 3 children are [name] (14), [name] (10) and [name] (7). The patient attends a local Hand: Not in place   HEALTH MAINTENANCE: History  Substance Use Topics  . Smoking status: Never Smoker   . Smokeless tobacco: Never Used  . Alcohol Use: No     Colonoscopy: January 2015  QQI:[id number] 2014  Bone density:  Lipid panel:  Allergies  Allergen Reactions  . Dust Mite Extract Itching    Current Outpatient Prescriptions  Medication Sig Dispense Refill  . ALPRAZolam (XANAX) 0.5 MG tablet Take 1 tablet (0.5 mg total) by mouth 2 (two) times daily as needed for anxiety. 10 tablet 0  . atorvastatin (LIPITOR) 20 MG tablet Take 20 mg by mouth daily.    . cetirizine (ZYRTEC) 10 MG tablet Take 10 mg by mouth daily.    . mometasone (NASONEX) 50 MCG/ACT nasal spray Place 2 sprays into the nose daily as needed (for allergies).     Marland Kitchen omeprazole (PRILOSEC) 20 MG capsule Take 1 capsule (20 mg total) by mouth 2 (two) times daily before a meal. 60 capsule 3  . oxymetazoline (AFRIN) 0.05 % nasal spray Place 2 sprays into the nose daily as needed. congestion    . PARoxetine (PAXIL) 20 MG  tablet Take 20 mg by mouth every morning.    Marland Kitchen RELPAX 40 MG tablet Take 40 mg by mouth every 2 (two) hours as needed for migraine.     . tamoxifen (NOLVADEX) 20 MG tablet Take 1 tablet (20 mg total) by mouth daily. 30 tablet 12  . Vitamin D, Ergocalciferol, (DRISDOL) 50000 UNITS CAPS capsule Take 50,000 Units by mouth every 7 (seven) days.      No current facility-administered medications for this visit.    OBJECTIVE: Middle-aged white woman who appears stated age 49 Vitals:   10/24/14 1514  BP: 127/75  Pulse: 88  Temp: 97.9 F (36.6 C)  Resp: 18     Body mass index is 35.93 kg/(m^2).    ECOG FS:0 - Asymptomatic  Sclerae unicteric, EOMs intact Oropharynx clear, dentition in good repair No cervical or supraclavicular adenopathy Lungs no rales or rhonchi Heart regular rate and rhythm  Abd soft, obese, nontender, positive bowel sounds MSK no focal spinal tenderness, no upper extremity lymphedema Neuro: nonfocal, well oriented, appropriate affect Breasts: The right breast is unremarkable. The left breast is status post lumpectomy and radiation. There is no evidence of local recurrence. Left axilla is benign.  CMP     Component Value Date/Time   NA 141 10/22/2014 1528   K 3.9 10/22/2014 1528   CO2 23 10/22/2014 1528   GLUCOSE 84 10/22/2014 1528   BUN 9.9 10/22/2014 1528   CREATININE 0.7 10/22/2014 1528   CALCIUM 8.2* 10/22/2014 1528   PROT 6.5 10/22/2014 1528   ALBUMIN 3.7 10/22/2014 1528   AST 60* 10/22/2014 1528   ALT 93* 10/22/2014 1528   ALKPHOS 90 10/22/2014 1528   BILITOT 0.24 10/22/2014 1528    I No results found for: SPEP  Lab Results  Component Value Date   WBC 7.8 10/22/2014   NEUTROABS 4.8 10/22/2014   HGB 12.4 10/22/2014   HCT 37.1 10/22/2014   MCV 83.5 10/22/2014   PLT 286 10/22/2014      Chemistry      Component Value Date/Time   NA 141 10/22/2014 1528   K 3.9 10/22/2014 1528   CO2 23 10/22/2014 1528   BUN 9.9 10/22/2014 1528   CREATININE  0.7 10/22/2014 1528      Component Value Date/Time   CALCIUM 8.2* 10/22/2014 1528   ALKPHOS 90 10/22/2014 1528   AST 60* 10/22/2014 1528   ALT 93* 10/22/2014 1528   BILITOT 0.24 10/22/2014 1528       No results found for: LABCA2  No components found for: LABCA125  No results for input(s): INR in the last 168 hours.  Urinalysis    Component Value Date/Time   BILIRUBINUR Negative 09/22/2011 1451   PROTEINUR Trace 09/22/2011 1451   UROBILINOGEN negative 09/22/2011 1451   NITRITE Negative 09/22/2011 1451   LEUKOCYTESUR Trace 09/22/2011 1451    STUDIES: No results found.  ASSESSMENT: 49 y.o. [place] woman status post left breast and left axillary lymph node biopsy 06/30/2013 for a clinical T2 N1-2, stage IIB/IIIA invasive ductal carcinoma, grade 2, estrogen and progesterone receptors both strongly positive, HER-2 nonamplified, with an MIB-1 of 36%.  (1) there is a 5 mm left internal mammary lymph node which was negative on PET scan  (2) cyclophosphamide and doxorubicin started 08/04/2014, given in dose dense fashion x4 with Neulasta day 2; completed 09/14/2013, followed by paclitaxel again given in dose dense fashion x4, completed 11/09/2013  (3) left lumpectomy and axillary lymph node dissection 12/13/2013 at [facility] showed a residual pT2 pN1 (mic), stage IIB invasive ductal carcinoma; margins positive for DCIS cleared with subsequent surgery (01/03/2014)  (4) adjuvant radiation therapy completed 04/03/2014  (5) tamoxifen started December 2015  (6) hepatic steatosis: documented on abdomianl US 08/07/2014, with elevated but stable LFTs,  PLAN: Kezia is coming up on a year now from her definitive surgery, with no evidence of disease recurrence. That is favorable. She is tolerating tamoxifen with no significant side effects, largely because she is not entirely menopausal at this time. She understands that tamoxifen is effective in pre-as well as postmenopausal patients.  She  is not quite sure if she wants to have a hysterectomy, so she does not have to worry about endometrial carcinoma (although that is rare) on tamoxifen; have bilateral salpingo-oophorectomy so that she can switch to an aromatase inhibitor (and so complete her antiestrogen course in 5 years instead of 10), or go on monthly  Zoladex so that she can "try" what menopause feels like and still be able to reverse it if intolerable symptoms develop.  In my view she is fine to simply continue on tamoxifen until she becomes naturally menopausal, at which point she will switch to an aromatase inhibitor. Of the other options above, bilateral salpingo-oophorectomy is probably the simplest and it has the advantage that it removes the ovaries. There would be no need for hysterectomy. Menopause, of course, would then be irreversible. She will be seeing her surgeon at [facility] in August. She is also working closely with Dr. Dema Severin on health maintenance issues. Accordingly she will return to see [place] in 6 months. She knows to call for any problems that may develop before that visit.      10/24/2014 3:37 PM

## 2014-10-24 NOTE — Telephone Encounter (Signed)
Appointments made and avs printed for patent

## 2014-10-24 NOTE — [facility] Oncology Navigator Note (Signed)
Oncology Nurse Navigator Documentation  Oncology Nurse Navigator Flowsheets 10/24/2014  Navigator Encounter Type Other - Patient at [facility] for routine f/u appointment.  Patient reports that she is doing well and denies any questions or concerns at this time.  Patient Visit Type Medonc  Barriers/Navigation Needs No barriers at this time  Interventions None required - Encouraged her to call me for any needs.  Time Spent with Patient 15

## 2015-02-20 ENCOUNTER — Telehealth: Payer: Self-pay | Admitting: *Deleted

## 2015-02-20 NOTE — Telephone Encounter (Signed)
  Oncology Nurse Navigator Documentation    Navigator Encounter Type: Telephone (Called patient to check in and left voice mail to call back) (02/20/15 1538)

## 2015-04-04 ENCOUNTER — Other Ambulatory Visit: Payer: Self-pay | Admitting: *Deleted

## 2015-04-04 DIAGNOSIS — C50312 Malignant neoplasm of lower-inner quadrant of left female breast: Secondary | ICD-10-CM

## 2015-04-05 ENCOUNTER — Other Ambulatory Visit: Payer: BLUE CROSS/BLUE SHIELD

## 2015-04-11 ENCOUNTER — Ambulatory Visit: Payer: BLUE CROSS/BLUE SHIELD | Admitting: Oncology

## 2015-04-11 ENCOUNTER — Telehealth: Payer: Self-pay | Admitting: *Deleted

## 2015-04-11 NOTE — Progress Notes (Signed)
No show

## 2015-04-11 NOTE — Telephone Encounter (Signed)
  Oncology Nurse Navigator Documentation    Navigator Encounter Type: Telephone (04/11/15 1522)     Barriers/Navigation Needs:  (Missed appointment) (04/11/15 1522)   Interventions:  (Called to check in) (04/11/15 1522)  Patient has not arrived for her appointment with Dr. Jana Hakim.  I called patient to check in and make sure she is OK.  I left contact information and a voice mail message on her home and cell phone.         Time Spent with Patient: 15 (04/11/15 1522)

## 2015-04-24 ENCOUNTER — Telehealth: Payer: Self-pay | Admitting: Oncology

## 2015-04-24 NOTE — Telephone Encounter (Signed)
Patient called in to reschedule her missed appointments °

## 2015-05-07 ENCOUNTER — Other Ambulatory Visit: Payer: Self-pay

## 2015-05-07 DIAGNOSIS — C50312 Malignant neoplasm of lower-inner quadrant of left female breast: Secondary | ICD-10-CM

## 2015-05-07 MED ORDER — TAMOXIFEN CITRATE 20 MG PO TABS: 20.0000 mg | ORAL_TABLET | Freq: Every day | ORAL | Status: DC

## 2015-05-17 ENCOUNTER — Other Ambulatory Visit: Payer: Self-pay | Admitting: Oncology

## 2015-05-17 ENCOUNTER — Other Ambulatory Visit: Payer: Self-pay | Admitting: *Deleted

## 2015-05-23 ENCOUNTER — Telehealth: Payer: Self-pay | Admitting: *Deleted

## 2015-05-23 NOTE — Telephone Encounter (Signed)
  Oncology Nurse Navigator Documentation    Navigator Encounter Type: Telephone (05/23/15 1600)     Barriers/Navigation Needs: No barriers at this time (05/23/15 1600)   Interventions: None required (05/23/15 1600)  I called patient to check in.  She reports that she is doing well.  She denies any questions or concerns at this time.  We reviewed her upcoming appointments.  I encouraged her to call me for any needs.         Time Spent with Patient: 15 (05/23/15 1600)

## 2015-06-11 ENCOUNTER — Other Ambulatory Visit: Payer: Self-pay | Admitting: *Deleted

## 2015-06-11 ENCOUNTER — Encounter: Payer: Self-pay | Admitting: *Deleted

## 2015-06-11 ENCOUNTER — Telehealth: Payer: Self-pay | Admitting: Oncology

## 2015-06-11 ENCOUNTER — Ambulatory Visit (HOSPITAL_BASED_OUTPATIENT_CLINIC_OR_DEPARTMENT_OTHER): Payer: BLUE CROSS/BLUE SHIELD | Admitting: Oncology

## 2015-06-11 ENCOUNTER — Other Ambulatory Visit (HOSPITAL_BASED_OUTPATIENT_CLINIC_OR_DEPARTMENT_OTHER): Payer: BLUE CROSS/BLUE SHIELD

## 2015-06-11 VITALS — BP 129/73 | HR 89 | Temp 98.2°F | Resp 18 | Ht 64.0 in | Wt 210.9 lb

## 2015-06-11 DIAGNOSIS — C50312 Malignant neoplasm of lower-inner quadrant of left female breast: Secondary | ICD-10-CM

## 2015-06-11 DIAGNOSIS — C773 Secondary and unspecified malignant neoplasm of axilla and upper limb lymph nodes: Secondary | ICD-10-CM

## 2015-06-11 DIAGNOSIS — K760 Fatty (change of) liver, not elsewhere classified: Secondary | ICD-10-CM | POA: Diagnosis not present

## 2015-06-11 DIAGNOSIS — M25511 Pain in right shoulder: Secondary | ICD-10-CM | POA: Diagnosis not present

## 2015-06-11 LAB — CBC WITH DIFFERENTIAL/PLATELET
BASO%: 0.7 % (ref 0.0–2.0)
Basophils Absolute: 0 10*3/uL (ref 0.0–0.1)
EOS%: 7.7 % — ABNORMAL HIGH (ref 0.0–7.0)
Eosinophils Absolute: 0.5 10*3/uL (ref 0.0–0.5)
HCT: 39.5 % (ref 34.8–46.6)
HGB: 13 g/dL (ref 11.6–15.9)
LYMPH%: 23.1 % (ref 14.0–49.7)
MCH: 27.4 pg (ref 25.1–34.0)
MCHC: 32.8 g/dL (ref 31.5–36.0)
MCV: 83.6 fL (ref 79.5–101.0)
MONO#: 0.5 10*3/uL (ref 0.1–0.9)
MONO%: 8.3 % (ref 0.0–14.0)
NEUT#: 3.8 10*3/uL (ref 1.5–6.5)
NEUT%: 60.2 % (ref 38.4–76.8)
Platelets: 256 10*3/uL (ref 145–400)
RBC: 4.73 10*6/uL (ref 3.70–5.45)
RDW: 13 % (ref 11.2–14.5)
WBC: 6.3 10*3/uL (ref 3.9–10.3)
lymph#: 1.4 10*3/uL (ref 0.9–3.3)

## 2015-06-11 LAB — COMPREHENSIVE METABOLIC PANEL
ALT: 91 U/L — ABNORMAL HIGH (ref 0–55)
AST: 59 U/L — ABNORMAL HIGH (ref 5–34)
Albumin: 3.9 g/dL (ref 3.5–5.0)
Alkaline Phosphatase: 65 U/L (ref 40–150)
Anion Gap: 10 mEq/L (ref 3–11)
BUN: 11.8 mg/dL (ref 7.0–26.0)
CO2: 23 mEq/L (ref 22–29)
Calcium: 9.2 mg/dL (ref 8.4–10.4)
Chloride: 108 mEq/L (ref 98–109)
Creatinine: 0.8 mg/dL (ref 0.6–1.1)
EGFR: >90 mL/min/{1.73_m2} (ref >90)
Glucose: 130 mg/dl (ref 70–140)
Potassium: 4.4 mEq/L (ref 3.5–5.1)
Sodium: 140 mEq/L (ref 136–145)
Total Bilirubin: 0.33 mg/dL (ref 0.20–1.20)
Total Protein: 6.8 g/dL (ref 6.4–8.3)

## 2015-06-11 NOTE — Telephone Encounter (Signed)
Appointments made and avs printed for patient °

## 2015-06-11 NOTE — Progress Notes (Signed)
[place]  [phone] [phone]     ID: Debbie [name] OB: 1965-11-20  MR#: [id number]  JYN#:[id number]  PCP: [name], MD GYN:  [name]: [name]; [name] OTHER MD: Cline Cools, [place]  CHIEF COMPLAINT: locally advanced breast cancer CURRENT TREATMENT:  tamoxifen  BREAST CANCER HISTORY: As per previously documented note:  Debbie [name] had routine yearly screening mammography at the breast center 06/19/2013 showing a possible mass in the left breast. The right breast was unremarkable. On 06/30/2013 additional views of the left breast showed an irregular spiculated dense mass measuring 2.2 cm in the lower inner quadrant. This was palpable at the 8:00 location. Ultrasound showed an irregular hypoechoic mass measuring 2.3 cm and in the left axilla a dominant abnormal appearing lymph node with cortical thickening measuring 1.2 cm.  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  [id number] the patient underwent bilateral breast MRI. This showed her breasts to be dense (composition C.). In the left breast at the 8:00 position there was a 2.4 cm enhancing mass containing a biopsy clip. There were no additional areas of concern. In the axilla on the left there was a 2.6 cm enlarged left axillary lymph node. There was also a 0.5 cm left internal mammary lymph nodes noted. The right side was unremarkable.  The patient's subsequent history is as detailed below.  INTERVAL HISTORY: Debbie [name] returns today for followup of her estrogen receptor  positivebreast cancer.  She continues on tamoxifen, which she obtains that approximately $7 a month. Hot flashes have become a little bit more marked, but she does not have any vaginal discharge issues on this drug. --  She tells me Dr. Cletis Media has checked her hormones and concluded she is not yet menopausal. [name] has not had a period since her chemotherapy but she also had an endometrial ablation. Also her husband is status post vasectomy.   since her last visit here also the patient's mother has been found to have DCIS. [name] of course has undergone prior genetic testing and was negative.   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 She has developed pain in her right scapula, which worries her. She thinks it may be related to the job, but of course the concern regarding metastatic breast cancer is clear. She is not exercising except through her normal daily activities. She and her husband may take a walk once a week at most. She still having occasional headaches, but they are not more intense or persistent than before and these were previously worked up. Aside from these issues a detailed review of systems today was noncontributory  PAST MEDICAL HISTORY: Past Medical History  Diagnosis Date  . MVP (mitral valve prolapse)     echo 2/15-mitral valve normal-no regurg,no stenosis  . IBS (irritable bowel syndrome)   . Migraines     menstral  . Hyperlipidemia   . Migraines   . IBS (irritable bowel syndrome)   . Abnormal Pap smear   .  Seasonal allergies   . Anxiety   . Menorrhagia   . Breast cancer 2015    ER+/PR+/Her2-  . Radiation 02/14/14-04/03/14    Left Breast    PAST SURGICAL HISTORY: Past Surgical History  Procedure Laterality Date  . Wisdom teeth removal    . Vulva /perineum biopsy  2011  . Svd       x 3  . Endometrial ablation    . Portacath placement Left 07/18/2013    Procedure: INSERTION  PORT-A-CATH;  Surgeon: [name], MD;  Location: [facility];  Service: General;  Laterality: Left;  . Port-a-cath removal N/A 05/15/2014    Procedure: REMOVAL PORT-A-CATH;  Surgeon: [name], MD;  Location: [facility];  Service: General;  Laterality: N/A;    FAMILY HISTORY Family History  Problem Relation Age of Onset  . Cancer Maternal Grandmother     BREAST AND UTERINE CANCER  . Uterine cancer Maternal Grandmother 84  . Hyperlipidemia Mother   . Hypertension Mother   . Multiple myeloma Father 75  . Lung cancer Maternal Development worker, international aid  . Testicular cancer Other 43  The patient's father is living, currently age 19. He has a history of multiple myeloma. The patient's mother is 41.  She was diagnosed with ductal carcinoma in situ in 2016.The patient's parents live in [place] The patient had no brothers, 2 sisters. There is no history of breast or ovarian cancer in the family. She believes one of her grandfathers died from lung cancer and another from pancreatic cancer, but is not sure  GYNECOLOGIC HISTORY:  Menarche age 44, first live birth age 76, which the patient understands is an independent risk factor for breast cancer. She is GX P3. She was still having regular periods at the start of chemotherapy. LMP was March 2015. She tells me Dr. Cletis Media checked her labs December 2015 and found that she was in the menopausal range. 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 Her husband Debbie [name] is a Energy manager at [employer]. Her 3 children are Debbie [name] (16), Debbie [name] (12) and Debbie [name] (9). The patient attends a local [employer]: Not in place   HEALTH MAINTENANCE: Social History  Substance Use Topics  . Smoking status: Never Smoker   . Smokeless tobacco: Never Used  . Alcohol Use: No     Colonoscopy: January 2015  DSW:VTVNR[facility] 2014  Bone  density:  Lipid panel:  Allergies  Allergen Reactions  . Dust Mite Extract Itching    Current Outpatient Prescriptions  Medication Sig Dispense Refill  . atorvastatin (LIPITOR) 20 MG tablet Take 20 mg by mouth daily.    . cetirizine (ZYRTEC) 10 MG tablet Take 10 mg by mouth daily.    . frovatriptan (FROVA) 2.5 MG tablet Take 1 tablet (2.5 mg total) by mouth as needed for migraine. If recurs, may repeat after 2 hours. Max of 3 tabs in 24 hours. 10 tablet 0  . mometasone (NASONEX) 50 MCG/ACT nasal spray Place 2 sprays into the nose daily as needed (for allergies).     Marland Kitchen omeprazole (PRILOSEC) 20 MG capsule Take 1 capsule (20 mg total) by mouth 2 (two) times daily before a meal. 60 capsule 3  . oxymetazoline (AFRIN) 0.05 % nasal spray Place 2 sprays into the nose daily as needed. congestion    . PARoxetine (PAXIL) 20 MG tablet  Take 20 mg by mouth every morning.    . tamoxifen (NOLVADEX) 20 MG tablet Take 1 tablet (20 mg total) by mouth daily. 30 tablet 12  . Vitamin D, Ergocalciferol, (DRISDOL) 50000 UNITS CAPS capsule Take 50,000 Units by mouth every 7 (seven) days.      No current facility-administered medications for this visit.    OBJECTIVE: Middle-aged white woman in no acute distress Filed Vitals:   06/11/15 1007  BP: 129/73  Pulse: 89  Temp: 98.2 F (36.8 C)  Resp: 18     Body mass index is 36.18 kg/(m^2).    ECOG FS:1 - Symptomatic but completely ambulatory  Sclerae unicteric, pupils round and equal Oropharynx clear and moist-- no thrush or other lesions No cervical or supraclavicular adenopathy Lungs no rales or rhonchi Heart regular rate and rhythm Abd soft, obese,nontender, positive bowel sounds MSK no focal spinal tenderness,  But there is an area in the mid right scapula which is tender to palpation. There is no palpable mass or erythema. There is no upper extremity lymphedema Neuro: nonfocal, well oriented, appropriate affect Breasts:  The right breast is  unremarkable, with no suspicious findings. The left breast is status post lumpectomy and radiation. There is no evidence of local recurrence. The left axilla is benign.   CMP     Component Value Date/Time   NA 141 10/22/2014 1528   K 3.9 10/22/2014 1528   CO2 23 10/22/2014 1528   GLUCOSE 84 10/22/2014 1528   BUN 9.9 10/22/2014 1528   CREATININE 0.7 10/22/2014 1528   CALCIUM 8.2* 10/22/2014 1528   PROT 6.5 10/22/2014 1528   ALBUMIN 3.7 10/22/2014 1528   AST 60* 10/22/2014 1528   ALT 93* 10/22/2014 1528   ALKPHOS 90 10/22/2014 1528   BILITOT 0.24 10/22/2014 1528    I No results found for: SPEP  Lab Results  Component Value Date   WBC 6.3 06/11/2015   NEUTROABS 3.8 06/11/2015   HGB 13.0 06/11/2015   HCT 39.5 06/11/2015   MCV 83.6 06/11/2015   PLT 256 06/11/2015      Chemistry      Component Value Date/Time   NA 141 10/22/2014 1528   K 3.9 10/22/2014 1528   CO2 23 10/22/2014 1528   BUN 9.9 10/22/2014 1528   CREATININE 0.7 10/22/2014 1528      Component Value Date/Time   CALCIUM 8.2* 10/22/2014 1528   ALKPHOS 90 10/22/2014 1528   AST 60* 10/22/2014 1528   ALT 93* 10/22/2014 1528   BILITOT 0.24 10/22/2014 1528       No results found for: LABCA2  No components found for: LABCA125  No results for input(s): INR in the last 168 hours.  Urinalysis    Component Value Date/Time   BILIRUBINUR Negative 09/22/2011 1451   PROTEINUR Trace 09/22/2011 1451   UROBILINOGEN negative 09/22/2011 1451   NITRITE Negative 09/22/2011 1451   LEUKOCYTESUR Trace 09/22/2011 1451    STUDIES:  mammography due at [facility] March 2017  ASSESSMENT: 50 y.o. [place] woman status post left breast and left axillary lymph node biopsy 06/30/2013 for a clinical T2 N1-2, stage IIB/IIIA invasive ductal carcinoma, grade 2, estrogen and progesterone receptors both strongly positive, HER-2 nonamplified, with an MIB-1 of 36%.  (1) there is a 5 mm left internal mammary lymph node which was  negative on PET scan  (2) cyclophosphamide and doxorubicin started 08/04/2014, given in dose dense fashion x4 with Neulasta day 2; completed 09/14/2013, followed by paclitaxel again given  in dose dense fashion x4, completed 11/09/2013  (3) left lumpectomy and axillary lymph node dissection 12/13/2013 at [facility] showed a residual pT2 pN1 (mic), stage IIB invasive ductal carcinoma; margins positive for DCIS cleared with subsequent surgery (01/03/2014)  (4) adjuvant radiation therapy completed 04/03/2014  (5) tamoxifen started December 2015  (6) hepatic steatosis: documented on abdomianl US 08/07/2014, with elevated but stable LFTs,  (7) genetic testing 07/24/2013 through the  "Women's Hereditary Panel", performed at [facility], found no deleterious mutations in  ATM, BRCA1, BRCA2, BRIP1, CDH1, CHEK2 (c.1100delC only), EPCAM, MLH1, MSH2, MSH6, NBN, PALB2, PMS2, PTEN, RAD51C, STK11, and TP53.  PLAN: Destynee is now a year and a half out from her definitive surgery with no evidence of disease recurrence. This is very favorable.  The plan is to continue tamoxifen for a minimum of 5 years, more likely 10. We will consider switching to an aromatase inhibitor at the 5 year mark   I think the right scapular pain is going to be due to her change in job and change in activity but were going to obtain a bone scan within the week to document that.   I encouraged her to exercise more regularly, the goal being 45 minutes 5 times a week. Walking, swimming, or stationary biking all would be excellent.    she will see Dr. Carvel Getting in March and have her mammograms there. She will return to see me in May. [name] knows to call for any problems that may develop before that visit.    06/11/2015 10:35 AM

## 2015-06-11 NOTE — Progress Notes (Signed)
  Oncology Nurse Navigator Documentation   Navigator Encounter Type: Follow-up Appt (06/11/15 1130)    Patient at [facility] for routine follow up visit with Dr. Jana Hakim.  Patient reports that she is doing well.  She does experience some pain in her back which she thinks may be the result of her new job requiring more physical activity, but still has her concerned.  She discussed with Dr. Jana Hakim and a bone scan is ordered.  She denies any other questions or concerns at this time.  I encouraged her to call me for any needs.      Patient Visit Type: MedOnc (06/11/15 1130) Treatment Phase: Follow-up (06/11/15 1130) Barriers/Navigation Needs: No barriers at this time (06/11/15 1130)   Interventions: None required (06/11/15 1130)            Acuity: Level 1 (06/11/15 1130)         Time Spent with Patient: 15 (06/11/15 1130)

## 2015-06-11 NOTE — Addendum Note (Signed)
Addended by: Laureen Abrahams on: 06/11/2015 03:48 PM   Modules accepted: Medications

## 2015-06-18 ENCOUNTER — Encounter (HOSPITAL_COMMUNITY)
Admission: RE | Admit: 2015-06-18 | Discharge: 2015-06-18 | Disposition: A | Discharge status: Home / Self Care | Payer: BLUE CROSS/BLUE SHIELD | Source: Ambulatory Visit | Attending: Oncology | Admitting: Oncology

## 2015-06-18 DIAGNOSIS — C50312 Malignant neoplasm of lower-inner quadrant of left female breast: Secondary | ICD-10-CM | POA: Insufficient documentation

## 2015-06-18 DIAGNOSIS — K760 Fatty (change of) liver, not elsewhere classified: Secondary | ICD-10-CM | POA: Diagnosis present

## 2015-06-18 MED ORDER — TECHNETIUM TC 99M MEDRONATE IV KIT
25.0000 | PACK | Freq: Once | INTRAVENOUS | Status: AC | PRN
  Administered 2015-06-18: 25 via INTRAVENOUS

## 2015-06-19 ENCOUNTER — Telehealth: Payer: Self-pay

## 2015-06-19 NOTE — Telephone Encounter (Signed)
Patient called regarding her bone scan results.  Writer read her the impression which was :" No evidence of bony metastatic disease".  Patient very happy with results and stated understanding.

## 2015-08-28 ENCOUNTER — Telehealth: Payer: Self-pay | Admitting: *Deleted

## 2015-08-28 NOTE — Telephone Encounter (Signed)
  Oncology Nurse Navigator Documentation  Navigator Location: [facility]-Med Onc (08/28/15 1354) Navigator Encounter Type: Telephone (08/28/15 1354)  I called patient to check in.  She reports that she is doing well.  We discussed her next appointments in May.  She denied any questions or concerns at this time.  I encouraged her to call me for any needs.             Barriers/Navigation Needs: No barriers at this time (08/28/15 1354)   Interventions: None required (08/28/15 1354)            Acuity: Level 1 (08/28/15 1354)         Time Spent with Patient: 15 (08/28/15 1354)

## 2015-09-12 DIAGNOSIS — C50312 Malignant neoplasm of lower-inner quadrant of left female breast: Secondary | ICD-10-CM | POA: Diagnosis not present

## 2015-09-20 IMAGING — CR DG CHEST 2V
2 series · 2 of 2 positions shown · non-contrast
Comparison: Chest CT July 15, 2013

CLINICAL DATA: Preoperative Port-A-Cath insertion ; breast
carcinoma

EXAM:
CHEST  2 VIEW

[w chest pa]
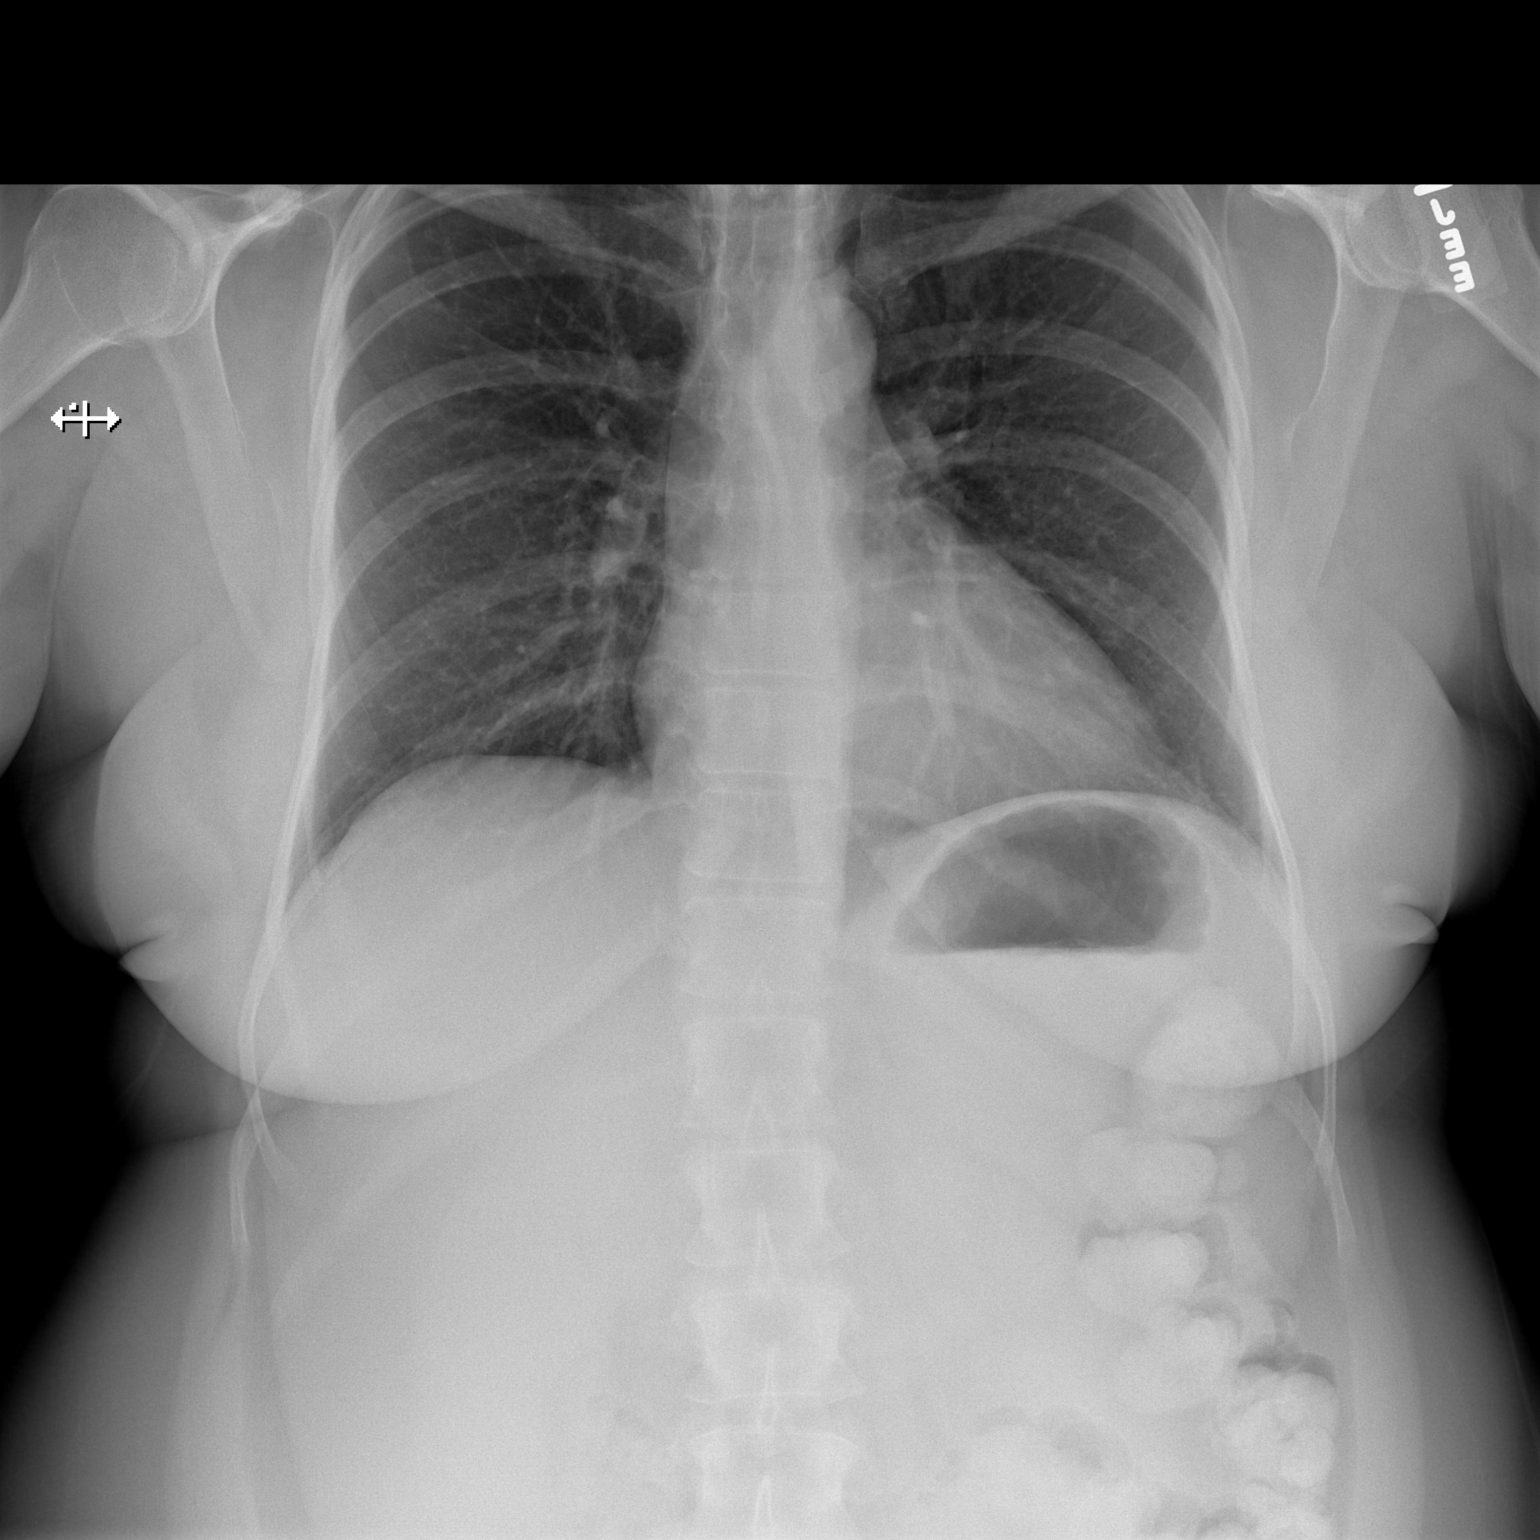

[w chest lat]
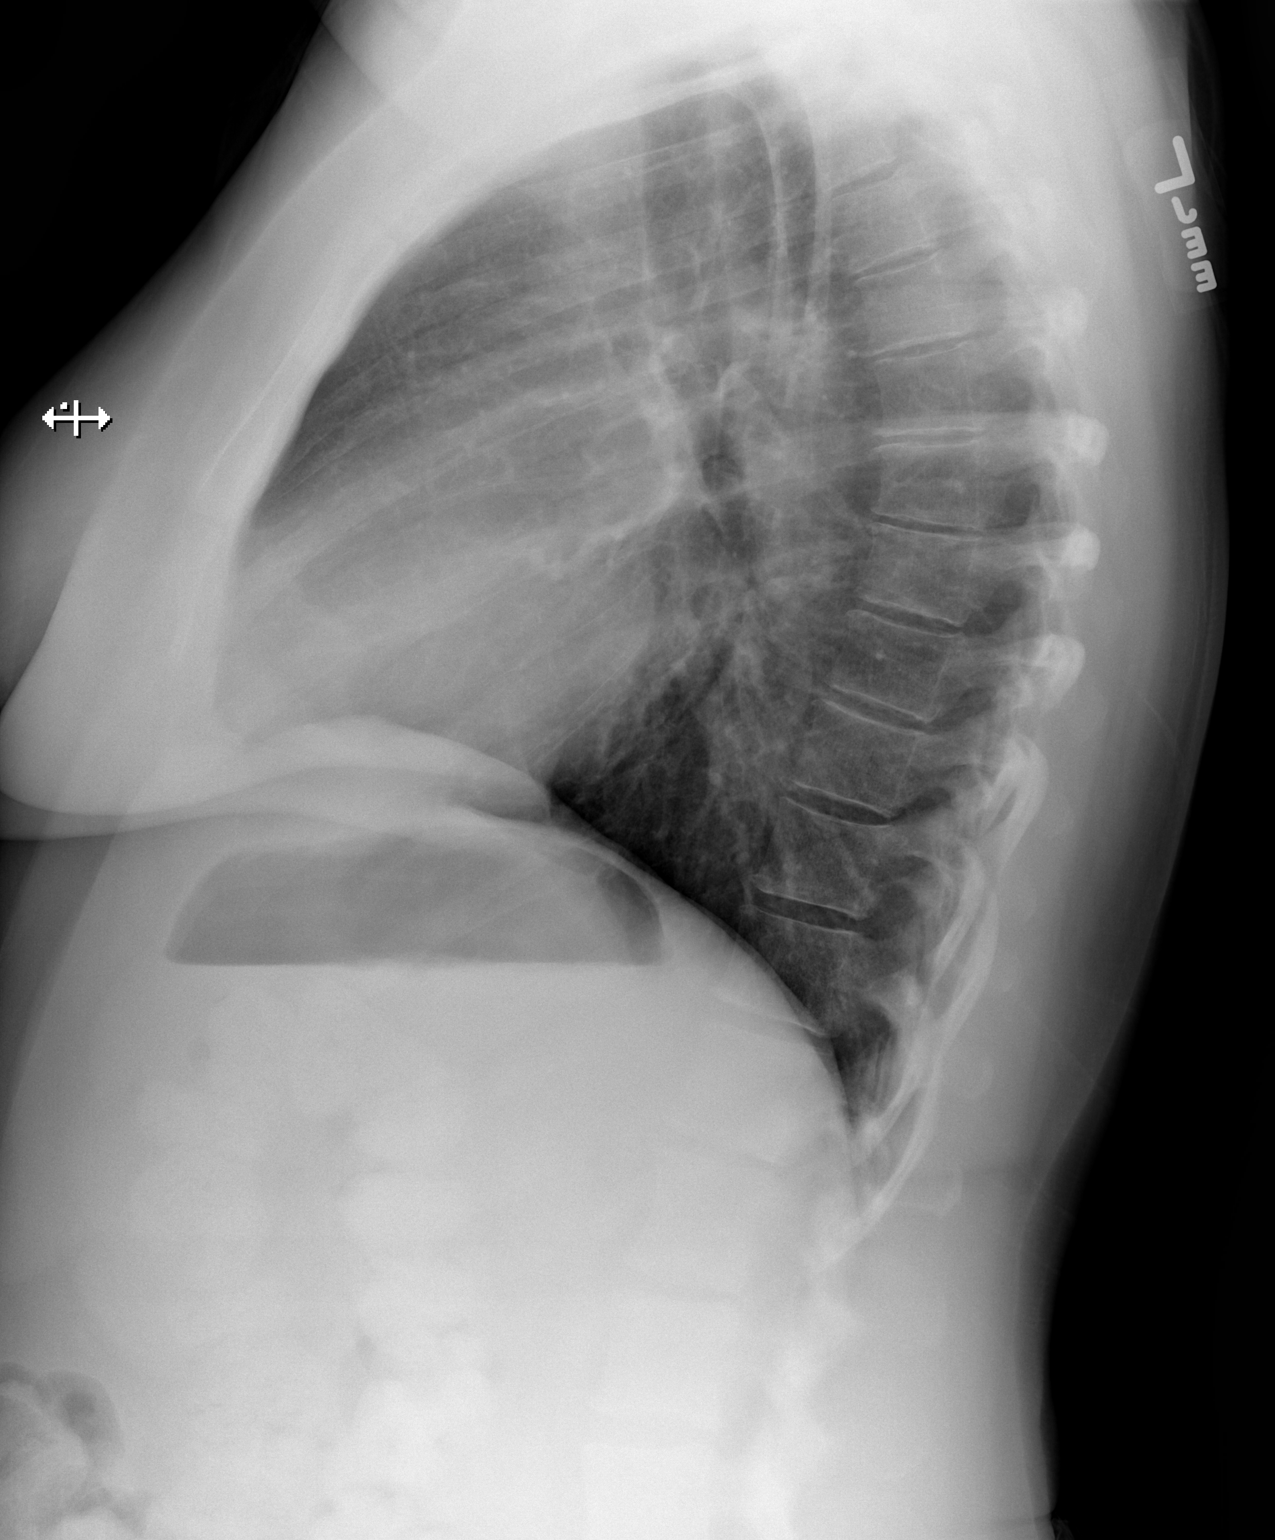

[2 of 2 positions shown; findings below may reference images not displayed]

FINDINGS: Lungs are clear. Heart size and pulmonary vascularity are normal. No
adenopathy. No bone lesions.
IMPRESSION: No abnormality noted.

## 2015-10-11 ENCOUNTER — Telehealth: Payer: Self-pay | Admitting: Oncology

## 2015-10-11 NOTE — Telephone Encounter (Signed)
S.w. Pt and r/s appt per pt request...pt ok and aware of new d.t

## 2015-10-15 ENCOUNTER — Other Ambulatory Visit: Payer: BLUE CROSS/BLUE SHIELD

## 2015-10-16 DIAGNOSIS — J0100 Acute maxillary sinusitis, unspecified: Secondary | ICD-10-CM | POA: Diagnosis not present

## 2015-10-16 DIAGNOSIS — G43909 Migraine, unspecified, not intractable, without status migrainosus: Secondary | ICD-10-CM | POA: Diagnosis not present

## 2015-10-16 DIAGNOSIS — E6609 Other obesity due to excess calories: Secondary | ICD-10-CM | POA: Diagnosis not present

## 2015-10-22 ENCOUNTER — Ambulatory Visit: Payer: BLUE CROSS/BLUE SHIELD | Admitting: Oncology

## 2015-12-05 ENCOUNTER — Other Ambulatory Visit (HOSPITAL_BASED_OUTPATIENT_CLINIC_OR_DEPARTMENT_OTHER): Payer: BLUE CROSS/BLUE SHIELD

## 2015-12-05 DIAGNOSIS — C50312 Malignant neoplasm of lower-inner quadrant of left female breast: Secondary | ICD-10-CM | POA: Diagnosis not present

## 2015-12-05 DIAGNOSIS — K760 Fatty (change of) liver, not elsewhere classified: Secondary | ICD-10-CM

## 2015-12-05 LAB — COMPREHENSIVE METABOLIC PANEL
ALT: 67 U/L — ABNORMAL HIGH (ref 0–55)
AST: 52 U/L — ABNORMAL HIGH (ref 5–34)
Albumin: 3.6 g/dL (ref 3.5–5.0)
Alkaline Phosphatase: 74 U/L (ref 40–150)
Anion Gap: 12 mEq/L — ABNORMAL HIGH (ref 3–11)
BUN: 13.9 mg/dL (ref 7.0–26.0)
CO2: 22 mEq/L (ref 22–29)
Calcium: 9.1 mg/dL (ref 8.4–10.4)
Chloride: 104 mEq/L (ref 98–109)
Creatinine: 0.8 mg/dL (ref 0.6–1.1)
EGFR: 85 mL/min/{1.73_m2} — ABNORMAL LOW (ref >90)
Glucose: 248 mg/dl — ABNORMAL HIGH (ref 70–140)
Potassium: 3.8 mEq/L (ref 3.5–5.1)
Sodium: 138 mEq/L (ref 136–145)
Total Bilirubin: 0.36 mg/dL (ref 0.20–1.20)
Total Protein: 6.8 g/dL (ref 6.4–8.3)

## 2015-12-05 LAB — CBC WITH DIFFERENTIAL/PLATELET
BASO%: 0.6 % (ref 0.0–2.0)
Basophils Absolute: 0 10*3/uL (ref 0.0–0.1)
EOS%: 4.3 % (ref 0.0–7.0)
Eosinophils Absolute: 0.3 10*3/uL (ref 0.0–0.5)
HCT: 37.8 % (ref 34.8–46.6)
HGB: 12.6 g/dL (ref 11.6–15.9)
LYMPH%: 26.9 % (ref 14.0–49.7)
MCH: 27.5 pg (ref 25.1–34.0)
MCHC: 33.4 g/dL (ref 31.5–36.0)
MCV: 82.5 fL (ref 79.5–101.0)
MONO#: 0.4 10*3/uL (ref 0.1–0.9)
MONO%: 5.4 % (ref 0.0–14.0)
NEUT#: 4.4 10*3/uL (ref 1.5–6.5)
NEUT%: 62.8 % (ref 38.4–76.8)
Platelets: 239 10*3/uL (ref 145–400)
RBC: 4.58 10*6/uL (ref 3.70–5.45)
RDW: 13.2 % (ref 11.2–14.5)
WBC: 7.1 10*3/uL (ref 3.9–10.3)
lymph#: 1.9 10*3/uL (ref 0.9–3.3)

## 2015-12-12 ENCOUNTER — Encounter: Payer: Self-pay | Admitting: *Deleted

## 2015-12-12 ENCOUNTER — Ambulatory Visit (HOSPITAL_BASED_OUTPATIENT_CLINIC_OR_DEPARTMENT_OTHER): Payer: BLUE CROSS/BLUE SHIELD | Admitting: Oncology

## 2015-12-12 VITALS — BP 121/55 | HR 74 | Temp 98.9°F | Resp 18 | Ht 64.0 in | Wt 208.2 lb

## 2015-12-12 DIAGNOSIS — Z170 Estrogen receptor positive status [ER+]: Secondary | ICD-10-CM | POA: Diagnosis not present

## 2015-12-12 DIAGNOSIS — K760 Fatty (change of) liver, not elsewhere classified: Secondary | ICD-10-CM | POA: Diagnosis not present

## 2015-12-12 DIAGNOSIS — C50312 Malignant neoplasm of lower-inner quadrant of left female breast: Secondary | ICD-10-CM

## 2015-12-12 DIAGNOSIS — C773 Secondary and unspecified malignant neoplasm of axilla and upper limb lymph nodes: Secondary | ICD-10-CM

## 2015-12-12 MED ORDER — TAMOXIFEN CITRATE 20 MG PO TABS: 20.0000 mg | ORAL_TABLET | Freq: Every day | ORAL | Status: DC

## 2015-12-12 NOTE — Progress Notes (Signed)
  Oncology Nurse Navigator Documentation  Navigator Location: [facility]-Med Onc (12/12/15 1130) Navigator Encounter Type: Follow-up Appt (12/12/15 1130)  Pt at [facility] for routine follow up visit with Dr. Jana Hakim.  Patient reports that she is doing well and enjoying her summer.  She did ask how she would know if the cancer comes back.  Dr. Jana Hakim told her that if she has a new unexplained symptom that lasts more than 3 days, to call the office.  I also reassured her that she can call me for any questions and concerns.  I gave her my contact information again.  She denied any other needs at this time.         Patient Visit Type: MedOnc (12/12/15 1130) Treatment Phase: Follow-up (12/12/15 1130) Barriers/Navigation Needs: No barriers at this time (12/12/15 1130)   Interventions: None required (12/12/15 1130)                      Time Spent with Patient: 15 (12/12/15 1130)

## 2015-12-12 NOTE — Progress Notes (Signed)
[place]  [phone] [phone]) [phone]     ID: Debbie [name] OB: Oct 08, 1965  MR#: [id number]  SWN#:[id number]  PCP: [name], MD GYN:  [name]: [name]; [name] OTHER MD: Cline Cools, [place]  CHIEF COMPLAINT: locally advanced breast cancer  CURRENT TREATMENT:  tamoxifen  BREAST CANCER HISTORY: As per previously documented note:  Debbie [name] had routine yearly screening mammography at the breast center 06/19/2013 showing a possible mass in the left breast. The right breast was unremarkable. On 06/30/2013 additional views of the left breast showed an irregular spiculated dense mass measuring 2.2 cm in the lower inner quadrant. This was palpable at the 8:00 location. Ultrasound showed an irregular hypoechoic mass measuring 2.3 cm and in the left axilla a dominant abnormal appearing lymph node with cortical thickening measuring 1.2 cm.  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  [id number] the patient underwent bilateral breast MRI. This showed her breasts to be dense (composition C.). In the left breast at the 8:00 position there was a 2.4 cm enhancing mass containing a biopsy clip. There were no additional areas of concern. In the axilla on the left there was a 2.6 cm enlarged left axillary lymph node. There was also a 0.5 cm left internal mammary lymph nodes noted. The right side was unremarkable.  The patient's subsequent history is as detailed below.  INTERVAL HISTORY: Debbie [name] returns today for followup of her estrogen receptor  positive breast cancer. She is tolerating tamoxifen well except for hot flashes. She does not have vaginal wetness problems. 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 She likes it but would like a higher pay which is an issue. Children are doing fine except what this 50 year old with the attitude". A detailed review of systems today was otherwise noncontributory  PAST MEDICAL HISTORY: Past Medical History  Diagnosis Date  . MVP (mitral valve prolapse)     echo 2/15-mitral valve normal-no regurg,no stenosis  . IBS (irritable bowel syndrome)   . Migraines     menstral  . Hyperlipidemia   . Migraines   . IBS (irritable bowel syndrome)   . Abnormal Pap smear   . Seasonal allergies   . Anxiety   . Menorrhagia   . Breast cancer 2015    ER+/PR+/Her2-  . Radiation 02/14/14-04/03/14    Left Breast    PAST SURGICAL HISTORY: Past Surgical History  Procedure Laterality Date  . Wisdom teeth removal    . Vulva /perineum biopsy  2011  . Svd       x 3  . Endometrial ablation    . Portacath placement Left 07/18/2013    Procedure: INSERTION PORT-A-CATH;  Surgeon: [name], MD;  Location: [place];  Service: General;  Laterality: Left;  . Port-a-cath removal N/A 05/15/2014    Procedure: REMOVAL PORT-A-CATH;  Surgeon: [name], MD;  Location: [place];  Service: General;  Laterality: N/A;    FAMILY HISTORY Family  History  Problem Relation Age of Onset  . Cancer Maternal Grandmother     BREAST AND UTERINE CANCER  . Uterine cancer Maternal Grandmother 84  . Hyperlipidemia Mother   . Hypertension Mother   . Multiple myeloma Father 75  . Lung cancer Maternal Development worker, international aid  . Testicular cancer Other 50  The patient's father is living, currently age 59. He has a history of multiple myeloma. The patient's mother is 56.  She was diagnosed with ductal  carcinoma in situ in 2016.The patient's parents live in [place] The patient had no brothers, 2 sisters. There is no history of breast or ovarian cancer in the family. She believes one of her grandfathers died from lung cancer and another from pancreatic cancer, but is not sure  GYNECOLOGIC HISTORY:  Menarche age 72, first live birth age 72, which the patient understands is an independent risk factor for breast cancer. She is GX P3. She was still having regular periods at the start of chemotherapy. LMP was March 2015. She tells me Dr. Cletis Media checked her labs December 2015 and found that she was in the menopausal range. 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 Her husband Debbie [name] is a Energy manager at [employer]. Her 3 children are Debbie [name] (16), Debbie [name] (12) and Debbie [name] (9). The patient attends a local [place]: Not in place   HEALTH MAINTENANCE: Social History  Substance Use Topics  . Smoking status: Never Smoker   . Smokeless tobacco: Never Used  . Alcohol Use: No     Colonoscopy: January 2015  DHR:CBULAGTX 2014  Bone density:  Lipid panel:  Allergies  Allergen Reactions  . Dust Mite Extract Itching    Current Outpatient Prescriptions  Medication Sig Dispense Refill  . cetirizine (ZYRTEC) 10 MG tablet Take 10 mg by mouth daily.    . frovatriptan (FROVA) 2.5 MG tablet Take 1 tablet (2.5 mg total) by mouth as needed for migraine. If recurs, may repeat after 2 hours. Max of 3 tabs in 24 hours. 10 tablet 0  . mometasone (NASONEX) 50 MCG/ACT nasal spray Place 2 sprays into the nose daily as needed (for allergies).     Marland Kitchen omeprazole (PRILOSEC) 20 MG capsule Take 1 capsule (20 mg total) by mouth 2 (two) times daily before a meal. 60 capsule 3  . oxymetazoline (AFRIN) 0.05 % nasal spray Place 2 sprays into the nose daily as needed. congestion    . PARoxetine  (PAXIL) 20 MG tablet Take 20 mg by mouth every morning.    . tamoxifen (NOLVADEX) 20 MG tablet Take 1 tablet (20 mg total) by mouth daily. 30 tablet 12  . Vitamin D, Ergocalciferol, (DRISDOL) 50000 UNITS CAPS capsule Take 50,000 Units by mouth every 7 (seven) days.      No current facility-administered medications for this visit.    OBJECTIVE: Middle-aged white woman Who appears well Filed Vitals:   12/12/15 1055  BP: 121/55  Pulse: 74  Temp: 98.9 F (37.2 C)  Resp: 18     Body mass index is 35.72 kg/(m^2).    ECOG FS:0 - Asymptomatic  Sclerae unicteric, EOMs intact Oropharynx clear and moist No cervical or supraclavicular adenopathy Lungs no rales or rhonchi Heart regular rate and rhythm Abd soft, nontender, positive bowel sounds MSK no focal spinal tenderness, no upper extremity lymphedema Neuro: nonfocal, well oriented,  appropriate affect Breasts: The right breast is unremarkable. The left breast is status post lumpectomy and radiation. There is no evidence of local recurrence. Left axilla is benign.      Component Value Date/Time   NA 138 12/05/2015 1447   K 3.8 12/05/2015 1447   CO2 22 12/05/2015 1447   GLUCOSE 248* 12/05/2015 1447   BUN 13.9 12/05/2015 1447   CREATININE 0.8 12/05/2015 1447   CALCIUM 9.1 12/05/2015 1447   PROT 6.8 12/05/2015 1447   ALBUMIN 3.6 12/05/2015 1447   AST 52* 12/05/2015 1447   ALT 67* 12/05/2015 1447   ALKPHOS 74 12/05/2015 1447   BILITOT 0.36 12/05/2015 1447    I No results found for: SPEP  Lab Results  Component Value Date   WBC 7.1 12/05/2015   NEUTROABS 4.4 12/05/2015   HGB 12.6 12/05/2015   HCT 37.8 12/05/2015   MCV 82.5 12/05/2015   PLT 239 12/05/2015      Chemistry      Component Value Date/Time   NA 138 12/05/2015 1447   K 3.8 12/05/2015 1447   CO2 22 12/05/2015 1447   BUN 13.9 12/05/2015 1447   CREATININE 0.8 12/05/2015 1447      Component Value Date/Time   CALCIUM 9.1 12/05/2015 1447   ALKPHOS 74 12/05/2015  1447   AST 52* 12/05/2015 1447   ALT 67* 12/05/2015 1447   BILITOT 0.36 12/05/2015 1447       No results found for: LABCA2  No components found for: LABCA125  No results for input(s): INR in the last 168 hours.  Urinalysis    Component Value Date/Time   BILIRUBINUR Negative 09/22/2011 1451   PROTEINUR Trace 09/22/2011 1451   UROBILINOGEN negative 09/22/2011 1451   NITRITE Negative 09/22/2011 1451   LEUKOCYTESUR Trace 09/22/2011 1451    STUDIES:  #[id number] - MAMMO DIAGNOSTIC DIGITAL BILATERAL BILATERAL DIGITAL DIAGNOSTIC MAMMOGRAM POST LUMPECTOMY: 09/12/2015 CLINICAL: C50.312 Malignant Neoplasm Of Lower-Inner Quadrant Of Left Female  Breast ([facility]) Post left lumpectomy.    Comparison is made to exams dated:  07/27/2014 ultrasound, 07/23/2014  mammogram, 12/12/2013 localization, and 12/12/2013 [facility]ogram - [facility].   The tissue of both breasts is heterogeneously dense.   At the surgical site in the left breast, there is density and architectural  distortion consistent with a post surgical scar.  Spot compression  magnification view of the lumpectomy site shows no mammographic evidence of  recurrent malignancy. Redemonstrated skin thickening of the left breast  compatible with post radiation change.   No suspicious masses, calcifications, or other findings are seen in either  breast.   There has been no significant interval change.  BENIGN Stable post surgical site, left breast.   There is no mammographic evidence of malignancy. A follow-up mammogram in  12 months is recommended.  The exam was electronically reviewed by a staff  physician.   The patient has been or will be contacted.    Dr. Derl Barrow has reviewed the study and concurs with the above findings.    Electronically signed by: Rupert Stacks M.D.  Lesly Dukes M.D. Electronically signed on: sg,[place]/:09/12/2015 15:16:50    Imaging Technologist: [facility]) [facility] [place]   Imaging letter sent: Normal   BI-RADS: 2 Benign [id number]  CLINICAL DATA: Left breast cancer with right mid back pain.  EXAM: NUCLEAR MEDICINE WHOLE BODY BONE SCAN  TECHNIQUE: Whole body anterior and posterior images were obtained approximately 3 hours after intravenous injection of radiopharmaceutical.  RADIOPHARMACEUTICALS: Twenty-seven mCi  Technetium-51mMDP IV  COMPARISON: None.  FINDINGS: No suspicious areas of bony uptake to suggest osseous metastatic disease. Soft tissue activity unremarkable.  IMPRESSION: No evidence of bony metastatic disease.   Electronically Signed  By: KRolm BaptiseM.D.  On: 06/18/2015 13:58   ASSESSMENT: 50y.o. [place] woman status post left breast lower inner quadrant and left axillary lymph node biopsy 06/30/2013 for a clinical T2 N1-2, stage IIB/IIIA invasive ductal carcinoma, grade 2, estrogen and progesterone receptors both strongly positive, HER-2 nonamplified, with an MIB-1 of 36%.  (1) there is a 5 mm left internal mammary lymph node which was negative on PET scan  (2) cyclophosphamide and doxorubicin started 08/04/2014, given in dose dense fashion x4 with Neulasta day 2; completed 09/14/2013, followed by paclitaxel again given in dose dense fashion x4, completed 11/09/2013  (3) [facility]1 (mic), stage IIB invasive ductal carcinoma; margins positive for DCIS cleared with subsequent surgery (01/03/2014)  (4) adjuvant radiation therapy completed 04/03/2014  (5) tamoxifen started December 2015  (6) hepatic steatosis: documented on abdomianl [place]12[place]2014, with elevated but stable LFTs,  (7) genetic testing 07/24/2013 through the  "Women's Hereditary Panel", performed at [facility] found no deleterious mutations in  ATM, BRCA1, BRCA2, BRIP1, CDH1, CHEK2 (c.1100delC only), EPCAM, MLH1, MSH2, MSH6, NBN, PALB2, PMS2, PTEN,  RAD51C, STK11, and TP53.  PLAN: TCambrynis now 2 years out from definitive surgery for her breast cancer with no evidence of disease recurrence. This is very favorable.  The plan is to continue tamoxifen likely for a total of 10 years. At some point we can consider switching to an aromatase inhibitor.  [facility].  We discussed symptoms that might make her want to call [place] really anything that worries her she should call u[place]oreaand nondistended with the worry. We can evaluate her as appropriate and reassure her.  Otherwise she is going to see me again in one year. She knows to call for any problems that may develop before then.    12/12/2015 11:38 AM

## 2016-04-30 ENCOUNTER — Telehealth: Payer: Self-pay | Admitting: *Deleted

## 2016-04-30 NOTE — Progress Notes (Signed)
  Oncology Nurse Navigator Documentation  Navigator Location: [facility] (04/30/16 1542)   )Navigator Encounter Type: Telephone (04/30/16 1542)  I called patient to check in.  She reports that she is doing well.  She is preparing to start a new job next week and things are busy.  She denies any questions or concerns at this time.  I encouraged her to call me for any needs.                     Treatment Phase: Active Tx (04/30/16 1542) Barriers/Navigation Needs: No barriers at this time;No Questions;No Needs (04/30/16 1542)   Interventions: None required (04/30/16 1542)                      Time Spent with Patient: 15 (04/30/16 1542)

## 2016-05-04 DIAGNOSIS — R7301 Impaired fasting glucose: Secondary | ICD-10-CM | POA: Diagnosis not present

## 2016-05-04 DIAGNOSIS — G43909 Migraine, unspecified, not intractable, without status migrainosus: Secondary | ICD-10-CM | POA: Diagnosis not present

## 2016-05-04 DIAGNOSIS — F419 Anxiety disorder, unspecified: Secondary | ICD-10-CM | POA: Diagnosis not present

## 2016-05-04 DIAGNOSIS — R739 Hyperglycemia, unspecified: Secondary | ICD-10-CM | POA: Diagnosis not present

## 2016-05-04 DIAGNOSIS — E559 Vitamin D deficiency, unspecified: Secondary | ICD-10-CM | POA: Diagnosis not present

## 2016-05-09 DIAGNOSIS — J0140 Acute pansinusitis, unspecified: Secondary | ICD-10-CM | POA: Diagnosis not present

## 2016-05-09 DIAGNOSIS — Z23 Encounter for immunization: Secondary | ICD-10-CM | POA: Diagnosis not present

## 2016-06-11 ENCOUNTER — Other Ambulatory Visit: Payer: Self-pay | Admitting: Nurse Practitioner

## 2016-07-04 ENCOUNTER — Other Ambulatory Visit: Payer: Self-pay | Admitting: Oncology

## 2016-07-04 DIAGNOSIS — C50312 Malignant neoplasm of lower-inner quadrant of left female breast: Secondary | ICD-10-CM

## 2016-07-08 NOTE — Telephone Encounter (Signed)
Chart reviewed.

## 2016-07-09 DIAGNOSIS — N950 Postmenopausal bleeding: Secondary | ICD-10-CM | POA: Diagnosis not present

## 2016-07-09 DIAGNOSIS — Z3040 Encounter for surveillance of contraceptives, unspecified: Secondary | ICD-10-CM | POA: Diagnosis not present

## 2016-07-09 DIAGNOSIS — C50912 Malignant neoplasm of unspecified site of left female breast: Secondary | ICD-10-CM | POA: Diagnosis not present

## 2016-07-09 DIAGNOSIS — Z124 Encounter for screening for malignant neoplasm of cervix: Secondary | ICD-10-CM | POA: Diagnosis not present

## 2016-07-09 DIAGNOSIS — N898 Other specified noninflammatory disorders of vagina: Secondary | ICD-10-CM | POA: Diagnosis not present

## 2016-07-09 DIAGNOSIS — Z01419 Encounter for gynecological examination (general) (routine) without abnormal findings: Secondary | ICD-10-CM | POA: Diagnosis not present

## 2016-08-04 DIAGNOSIS — N898 Other specified noninflammatory disorders of vagina: Secondary | ICD-10-CM | POA: Diagnosis not present

## 2016-08-04 DIAGNOSIS — N950 Postmenopausal bleeding: Secondary | ICD-10-CM | POA: Diagnosis not present

## 2016-08-20 ENCOUNTER — Other Ambulatory Visit: Payer: Self-pay | Admitting: Obstetrics and Gynecology

## 2016-08-20 DIAGNOSIS — C50912 Malignant neoplasm of unspecified site of left female breast: Secondary | ICD-10-CM | POA: Diagnosis not present

## 2016-08-20 DIAGNOSIS — N840 Polyp of corpus uteri: Secondary | ICD-10-CM | POA: Diagnosis not present

## 2016-08-20 DIAGNOSIS — N950 Postmenopausal bleeding: Secondary | ICD-10-CM | POA: Diagnosis not present

## 2016-08-26 DIAGNOSIS — C50912 Malignant neoplasm of unspecified site of left female breast: Secondary | ICD-10-CM | POA: Diagnosis not present

## 2016-08-26 DIAGNOSIS — Z09 Encounter for follow-up examination after completed treatment for conditions other than malignant neoplasm: Secondary | ICD-10-CM | POA: Diagnosis not present

## 2016-08-26 DIAGNOSIS — L293 Anogenital pruritus, unspecified: Secondary | ICD-10-CM | POA: Diagnosis not present

## 2016-10-30 DIAGNOSIS — M79601 Pain in right arm: Secondary | ICD-10-CM | POA: Diagnosis not present

## 2016-10-30 DIAGNOSIS — G43809 Other migraine, not intractable, without status migrainosus: Secondary | ICD-10-CM | POA: Diagnosis not present

## 2016-11-09 DIAGNOSIS — G43909 Migraine, unspecified, not intractable, without status migrainosus: Secondary | ICD-10-CM | POA: Diagnosis not present

## 2016-11-09 DIAGNOSIS — R7303 Prediabetes: Secondary | ICD-10-CM | POA: Diagnosis not present

## 2016-11-09 DIAGNOSIS — R945 Abnormal results of liver function studies: Secondary | ICD-10-CM | POA: Diagnosis not present

## 2016-11-09 DIAGNOSIS — F419 Anxiety disorder, unspecified: Secondary | ICD-10-CM | POA: Diagnosis not present

## 2016-12-01 ENCOUNTER — Other Ambulatory Visit: Payer: Self-pay

## 2016-12-01 DIAGNOSIS — C50312 Malignant neoplasm of lower-inner quadrant of left female breast: Secondary | ICD-10-CM

## 2016-12-03 ENCOUNTER — Other Ambulatory Visit: Payer: BLUE CROSS/BLUE SHIELD

## 2016-12-10 ENCOUNTER — Ambulatory Visit: Payer: BLUE CROSS/BLUE SHIELD | Admitting: Oncology

## 2016-12-15 DIAGNOSIS — Z853 Personal history of malignant neoplasm of breast: Secondary | ICD-10-CM | POA: Diagnosis not present

## 2016-12-15 DIAGNOSIS — C50912 Malignant neoplasm of unspecified site of left female breast: Secondary | ICD-10-CM | POA: Diagnosis not present

## 2016-12-15 DIAGNOSIS — Z1231 Encounter for screening mammogram for malignant neoplasm of breast: Secondary | ICD-10-CM | POA: Diagnosis not present

## 2016-12-15 DIAGNOSIS — Z170 Estrogen receptor positive status [ER+]: Secondary | ICD-10-CM | POA: Diagnosis not present

## 2017-02-24 ENCOUNTER — Other Ambulatory Visit: Payer: Self-pay | Admitting: Obstetrics and Gynecology

## 2017-02-24 DIAGNOSIS — C50912 Malignant neoplasm of unspecified site of left female breast: Secondary | ICD-10-CM | POA: Diagnosis not present

## 2017-02-24 DIAGNOSIS — R938 Abnormal findings on diagnostic imaging of other specified body structures: Secondary | ICD-10-CM | POA: Diagnosis not present

## 2017-02-24 DIAGNOSIS — L293 Anogenital pruritus, unspecified: Secondary | ICD-10-CM | POA: Diagnosis not present

## 2017-02-24 DIAGNOSIS — N858 Other specified noninflammatory disorders of uterus: Secondary | ICD-10-CM | POA: Diagnosis not present

## 2017-03-04 ENCOUNTER — Telehealth: Payer: Self-pay | Admitting: *Deleted

## 2017-03-04 NOTE — Telephone Encounter (Signed)
This RN returned call to the patient per her VM- per her wanting to see MD and told by scheduling no openings until December.  Per call Debbie [name] states she has developed a pain in her upper left back near the spine but pain can radiate down her left arm.  She states concern due to known history of breast cancer as well as " and my friend in [place] who has breast cancer just found out the pain she was having in her hip is the breast cancer "-   This RN discussed with pt MD out of the office this week- but can override schedule and bring her in on 03/09/2017 at 830. MD can assess area and concern and if needed obtain appropriate scan.  Validation of her concerns and feelings given for support.  Note this RN also received communication from [place] that Dr Cletis Media called for Dr Jannifer Rodney - this RN returned call to her at [phone].  Dr Cletis Media stated [name] has recurrent thickening of her endometrium post prior d&c with biopsy ( negative reading )- plan at present is to proceed with hysterectomy - " but I wanted Dr Gerarda Fraction input regarding if oophorectomy would be of benefit for the patient.  This RN informed Dr Cletis Media above information will be given to MD upon return so when he sees her on 03/08/2017 above can be discussed as well.  Dr Cletis Media will await return call post visit on 03/08/2017.

## 2017-03-04 NOTE — Telephone Encounter (Signed)
"  Dr. Cletis Media calling for Dr. Jana Hakim in reference to this patient.  Please have him return my call upon his return to office Monday, March 08, 2017.  Return  number [phone]."

## 2017-03-09 ENCOUNTER — Ambulatory Visit (HOSPITAL_COMMUNITY)
Admission: RE | Admit: 2017-03-09 | Discharge: 2017-03-09 | Disposition: A | Discharge status: Home / Self Care | Payer: BLUE CROSS/BLUE SHIELD | Source: Ambulatory Visit | Attending: Oncology | Admitting: Oncology

## 2017-03-09 ENCOUNTER — Encounter: Payer: Self-pay | Admitting: *Deleted

## 2017-03-09 ENCOUNTER — Telehealth: Payer: Self-pay | Admitting: Oncology

## 2017-03-09 ENCOUNTER — Ambulatory Visit (HOSPITAL_BASED_OUTPATIENT_CLINIC_OR_DEPARTMENT_OTHER): Payer: BLUE CROSS/BLUE SHIELD | Admitting: Oncology

## 2017-03-09 VITALS — BP 127/67 | HR 82 | Temp 98.3°F | Resp 20 | Ht 64.0 in | Wt 207.9 lb

## 2017-03-09 DIAGNOSIS — M545 Low back pain: Secondary | ICD-10-CM | POA: Diagnosis not present

## 2017-03-09 DIAGNOSIS — Z170 Estrogen receptor positive status [ER+]: Secondary | ICD-10-CM | POA: Insufficient documentation

## 2017-03-09 DIAGNOSIS — C50312 Malignant neoplasm of lower-inner quadrant of left female breast: Secondary | ICD-10-CM

## 2017-03-09 DIAGNOSIS — C773 Secondary and unspecified malignant neoplasm of axilla and upper limb lymph nodes: Secondary | ICD-10-CM

## 2017-03-09 DIAGNOSIS — M546 Pain in thoracic spine: Secondary | ICD-10-CM | POA: Diagnosis not present

## 2017-03-09 NOTE — Progress Notes (Signed)
  Oncology Nurse Navigator Documentation  Navigator Location: [facility] (03/09/17 0848)   )Navigator Encounter Type: Follow-up Appt (03/09/17 0848)  Patient called Dr. [name] nurse with concern that she has been having back pain.  Patient was scheduled to see him this am.  Patient reports that she is concerned because she has had this back pain for a while.  She became more concerned when a friend in [place] who also has breast cancer had pain in her hip which turned out to be breast cancer mets.  I reassured her that calling to evaluate her pain was the appropriate thing to do.  She has otherwise been doing well.  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  She denied any needs at this time. I encouraged her to call me for any assistance she may need.                     Patient Visit Type: MedOnc (03/09/17 0848)   Barriers/Navigation Needs: No barriers at this time (03/09/17 0848)   Interventions: None required (03/09/17 0848)                      Time Spent with Patient: 15 (03/09/17 0848)

## 2017-03-09 NOTE — Telephone Encounter (Signed)
Gave patient avs and calendar per 10/9 los

## 2017-03-09 NOTE — Progress Notes (Signed)
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 The right breast was unremarkable. On 06/30/2013 additional views of the left breast showed an irregular spiculated dense mass measuring 2.2 cm in the lower inner quadrant. This was palpable at the 8:00 location. Ultrasound showed an irregular hypoechoic mass measuring 2.3 cm and in the left axilla a dominant abnormal appearing lymph node with cortical thickening measuring 1.2 cm.  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  [id number] the patient underwent bilateral breast MRI. This showed her breasts to be dense (composition C.). In the left breast at the 8:00 position there was a 2.4 cm enhancing mass containing a biopsy clip. There were no additional areas of concern. In the axilla on the left there was a 2.6 cm enlarged left axillary lymph node. There was also a 0.5 cm left internal mammary lymph nodes noted. The right side was unremarkable.  The patient's subsequent history is as detailed below.  INTERVAL HISTORY: Debbie [name] returns today for follow-up of  her estrogen receptor positive breast cancer. She continues on tamoxifen, generally with good tolerance.  More recently the patient was evaluated by Dr. Cletis Media who found recurrent thickening of the endometrium, with biopsy 02/24/2017 showing weekly proliferative endometrium with metaplastic changes but no hyperplasia atypia or malignancy ([id number]). Dr. Cletis Media is considering a hysterectomy, and wonders if bilateral salpingo-oophorectomy would be of benefit.  In addition the patient has developed pain in her left upper back near the spine, radiating down her left arm. She is concerned because a friend in [place] with breast cancer had a similar symptom and was found to have metastatic disease.  [name] is here today to discuss those concerns   REVIEW OF SYSTEMS: [name] reports middle, left,  back pain that is exacerbated when coughing, sneezing, and with certain movements that started about 2 months ago. She endorses monthly migraine headaches, even though she no longer has a menstrual cycle. She denies unusal visual changes, nausea, vomiting, or dizziness. There has been no unusual cough, phlegm production, or pleurisy. This been no change in bowel or bladder habits. She denies unexplained fatigue or unexplained weight loss, bleeding, rash, or fever. She is not exercising regularly, walking and mild perhaps a couple of times a week at most. A detailed review of systems was otherwise negative.   PAST MEDICAL HISTORY: Past Medical History:  Diagnosis Date  . Abnormal Pap smear   . Anxiety   . Breast cancer 2015   ER+/PR+/Her2-  . Hyperlipidemia   . IBS (irritable bowel syndrome)   . IBS (irritable bowel syndrome)   . Menorrhagia   . Migraines    menstral  .  Migraines   . MVP (mitral valve prolapse)    echo 2/15-mitral valve normal-no regurg,no stenosis  . Radiation 02/14/14-04/03/14   Left Breast  . Seasonal allergies     PAST SURGICAL HISTORY: Past Surgical History:  Procedure Laterality  Date  . ENDOMETRIAL ABLATION    . PORT-A-CATH REMOVAL N/A 05/15/2014   Procedure: REMOVAL PORT-A-CATH;  Surgeon: [name], MD;  Location: [facility];  Service: General;  Laterality: N/A;  . PORTACATH PLACEMENT Left 07/18/2013   Procedure: INSERTION PORT-A-CATH;  Surgeon: [name], MD;  Location: [facility];  Service: General;  Laterality: Left;  . SVD      x 3  . VULVA /PERINEUM BIOPSY  2011  . WISDOM TEETH REMOVAL      FAMILY HISTORY Family History  Problem Relation Age of Onset  . Cancer Maternal Grandmother        BREAST AND UTERINE CANCER  . Uterine cancer Maternal Grandmother 84  . Hyperlipidemia Mother   . Hypertension Mother   . Multiple myeloma Father 92  . Lung cancer Maternal Manufacturing engineer  . Testicular cancer Other 46  The patient's father is living, currently age 2. He has a history of multiple myeloma. The patient's mother is 50.  She was diagnosed with ductal carcinoma in situ in 2016.The patient's parents live in [place] The patient had no brothers, 2 sisters. There is no history of breast or ovarian cancer in the family. She believes one of her grandfathers died from lung cancer and another from pancreatic cancer, but is not sure  GYNECOLOGIC HISTORY:  Menarche age 69, first live birth age 25, which the patient understands is an independent risk factor for breast cancer. She is GX P3. She was still having regular periods at the start of chemotherapy. LMP was March 2015. She tells me Dr. Cletis Media checked her labs December 2015 and found that she was in the menopausal range. 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 Her husband [name] is a Energy manager at [employer]. Her 3 children are [name] (16), [name] (12) and [name] (9). The patient attends a local [place]: Not in  place   HEALTH MAINTENANCE: Social History  Substance Use Topics  . Smoking status: Never Smoker  . Smokeless tobacco: Never Used  . Alcohol use No     Colonoscopy: January 2015  HWE:XHBZJIRC 2014  Bone density:  Lipid panel:  Allergies  Allergen Reactions  . Dust Mite Extract Itching    Current Outpatient Prescriptions  Medication Sig Dispense Refill  . cetirizine (ZYRTEC) 10 MG tablet Take 10 mg by mouth daily.    . frovatriptan (FROVA) 2.5 MG tablet Take 1 tablet (2.5 mg total) by mouth as needed for migraine. If recurs, may repeat after 2 hours. Max of 3 tabs in 24 hours. 10 tablet 0  . mometasone (NASONEX) 50 MCG/ACT nasal spray Place 2 sprays into the nose daily as needed (for allergies).     Marland Kitchen omeprazole (PRILOSEC) 20 MG capsule Take 1 capsule (20 mg total) by mouth 2 (two) times daily before a meal. 60 capsule 3  . oxymetazoline (AFRIN) 0.05 % nasal spray Place 2 sprays into the nose daily as needed. congestion    . PARoxetine (PAXIL) 20 MG tablet Take 20 mg by mouth every morning.    Marland Kitchen  tamoxifen (NOLVADEX) 20 MG tablet TAKE 1 TABLET DAILY 90 tablet 3  . Vitamin D, Ergocalciferol, (DRISDOL) 50000 UNITS CAPS capsule Take 50,000 Units by mouth every 7 (seven) days.      No current facility-administered medications for this visit.     OBJECTIVE: Middle-aged white woman In no acute distress Vitals:   03/09/17 0844  BP: 127/67  Pulse: 82  Resp: 20  Temp: 98.3 F (36.8 C)  SpO2: 96%     Body mass index is 35.69 kg/m.    ECOG FS:1 - Symptomatic but completely ambulatory  Sclerae unicteric, pupils round and equal Oropharynx clear and moist No cervical or supraclavicular adenopathy Lungs no rales or rhonchi Heart regular rate and rhythm Abd soft, nontender, positive bowel sounds MSK no focal spinal tenderness, no upper extremity lymphedema Neuro: nonfocal, well oriented, appropriate affect Breasts: The right breast is benign. The left breast is status post  lumpectomy and radiation. There is no evidence of local recurrence. Both axillae are benign.      Component Value Date/Time   NA 138 12/05/2015 1447   K 3.8 12/05/2015 1447   CO2 22 12/05/2015 1447   GLUCOSE 248 (H) 12/05/2015 1447   BUN 13.9 12/05/2015 1447   CREATININE 0.8 12/05/2015 1447   CALCIUM 9.1 12/05/2015 1447   PROT 6.8 12/05/2015 1447   ALBUMIN 3.6 12/05/2015 1447   AST 52 (H) 12/05/2015 1447   ALT 67 (H) 12/05/2015 1447   ALKPHOS 74 12/05/2015 1447   BILITOT 0.36 12/05/2015 1447    I No results found for: SPEP  Lab Results  Component Value Date   WBC 7.1 12/05/2015   NEUTROABS 4.4 12/05/2015   HGB 12.6 12/05/2015   HCT 37.8 12/05/2015   MCV 82.5 12/05/2015   PLT 239 12/05/2015      Chemistry      Component Value Date/Time   NA 138 12/05/2015 1447   K 3.8 12/05/2015 1447   CO2 22 12/05/2015 1447   BUN 13.9 12/05/2015 1447   CREATININE 0.8 12/05/2015 1447      Component Value Date/Time   CALCIUM 9.1 12/05/2015 1447   ALKPHOS 74 12/05/2015 1447   AST 52 (H) 12/05/2015 1447   ALT 67 (H) 12/05/2015 1447   BILITOT 0.36 12/05/2015 1447       No results found for: LABCA2  No components found for: LABCA125  No results for input(s): INR in the last 168 hours.  Urinalysis    Component Value Date/Time   BILIRUBINUR Negative 09/22/2011 1451   PROTEINUR Trace 09/22/2011 1451   UROBILINOGEN negative 09/22/2011 1451   NITRITE Negative 09/22/2011 1451   LEUKOCYTESUR Trace 09/22/2011 1451    STUDIES: Bilateral diagnostic mammography at [facility] 12/15/2016 found the breast density to be category C. There was no evidence of malignancy.   ASSESSMENT: 51 y.o. [place] woman status post left breast lower inner quadrant and left axillary lymph node biopsy 06/30/2013 for a clinical T2 N1-2, stage IIB/IIIA invasive ductal carcinoma, grade 2, estrogen and progesterone receptors both strongly positive, HER-2 nonamplified, with an MIB-1 of 36%.  (1) there is  a 5 mm left internal mammary lymph node which was negative on PET scan  (2) cyclophosphamide and doxorubicin started 08/04/2014, given in dose dense fashion x4 with Neulasta day 2; completed 09/14/2013, followed by paclitaxel again given in dose dense fashion x4, completed 11/09/2013  (3) left lumpectomy and axillary lymph node dissection 12/13/2013 at [facility] showed a residual pT2 pN1 (mic), stage IIB invasive ductal  carcinoma; margins positive for DCIS cleared with subsequent surgery (01/03/2014)  (4) adjuvant radiation therapy completed 04/03/2014  (5) tamoxifen started December 2015, discontinued October 2018  (6) hepatic steatosis: documented on abdomianl US 08/07/2014, with elevated but stable LFTs,  (7) genetic testing 07/24/2013 through the  "Women's Hereditary Panel", performed at [facility], found no deleterious mutations in  ATM, BRCA1, BRCA2, BRIP1, CDH1, CHEK2 (c.1100delC only), EPCAM, MLH1, MSH2, MSH6, NBN, PALB2, PMS2, PTEN, RAD51C, STK11, and TP53.  (8) anastrozole started October 2018  PLAN: Debbie [name] is not a little over 3 years out from definitive surgery for her breast cancer with no evidence of disease recurrence. This is very favorable.  She has been on tamoxifen during this period with good tolerance. The main issue has been endometrial hyperplasia. She has required biopsies 2 and at this point is considering hysterectomy.  While there is no family history of ovarian cancer and she does not carry a deleterious mutation that we can identify, she is inclined to also add bilateral salpingo-oophorectomy because she feels this will make a difference to her migraines.  Alternatively, we could switch to anastrozole. As opposed to tamoxifen, which stimulates endometrial lining, and anastrozole is a pure antiestrogen. The general effect on the endometrium is atrophy and of course it also causes significant problems with vaginal dryness, hot flashes, arthralgias,  myalgias, and low bone density.  We discussed all this in detail and basically the choice for Adanely is to proceed to surgery or switch to anastrozole. At this point she would like to switch to anastrozole and postpone the surgery.  Accordingly I am going to see her again in 3 months. If she does tolerate anastrozole well we will proceed to bone density scanning and consider repeat transvaginal ultrasound at some point.  As far as the back pain is concerned I am obtaining plain films of the thoracic spine today. She will call with those results. I am also setting her up for a CT scan of the chest in a week.  She knows to call for any other issues that may develop before her next visit which will be in 3 months. Chauncey Cruel, MD 03/09/17  9:00 AM Medical Oncology and Hematology [facility] [address] [place], [place] [address] Tel. [phone] Fax. [phone]  This document serves as a record of services personally performed by Lurline Del, MD. It was created on her behalf by Margit Banda, a trained medical scribe. The creation of this record is based on the scribe's personal observations and the provider's statements to them. This document has been checked and approved by the attending provider.

## 2017-03-13 ENCOUNTER — Other Ambulatory Visit: Payer: Self-pay | Admitting: Oncology

## 2017-03-16 ENCOUNTER — Ambulatory Visit (HOSPITAL_COMMUNITY): Payer: BLUE CROSS/BLUE SHIELD

## 2017-03-17 ENCOUNTER — Ambulatory Visit (HOSPITAL_COMMUNITY)
Admission: RE | Admit: 2017-03-17 | Discharge: 2017-03-17 | Disposition: A | Discharge status: Home / Self Care | Payer: BLUE CROSS/BLUE SHIELD | Source: Ambulatory Visit | Attending: Oncology | Admitting: Oncology

## 2017-03-17 ENCOUNTER — Other Ambulatory Visit (HOSPITAL_BASED_OUTPATIENT_CLINIC_OR_DEPARTMENT_OTHER): Payer: BLUE CROSS/BLUE SHIELD

## 2017-03-17 ENCOUNTER — Other Ambulatory Visit: Payer: Self-pay

## 2017-03-17 ENCOUNTER — Encounter (HOSPITAL_COMMUNITY): Payer: Self-pay

## 2017-03-17 DIAGNOSIS — C50312 Malignant neoplasm of lower-inner quadrant of left female breast: Secondary | ICD-10-CM

## 2017-03-17 DIAGNOSIS — K760 Fatty (change of) liver, not elsewhere classified: Secondary | ICD-10-CM | POA: Insufficient documentation

## 2017-03-17 DIAGNOSIS — J90 Pleural effusion, not elsewhere classified: Secondary | ICD-10-CM | POA: Insufficient documentation

## 2017-03-17 DIAGNOSIS — R918 Other nonspecific abnormal finding of lung field: Secondary | ICD-10-CM | POA: Diagnosis not present

## 2017-03-17 DIAGNOSIS — R911 Solitary pulmonary nodule: Secondary | ICD-10-CM | POA: Diagnosis not present

## 2017-03-17 DIAGNOSIS — Z170 Estrogen receptor positive status [ER+]: Secondary | ICD-10-CM | POA: Diagnosis not present

## 2017-03-17 DIAGNOSIS — C773 Secondary and unspecified malignant neoplasm of axilla and upper limb lymph nodes: Secondary | ICD-10-CM

## 2017-03-17 DIAGNOSIS — R590 Localized enlarged lymph nodes: Secondary | ICD-10-CM | POA: Insufficient documentation

## 2017-03-17 LAB — CBC WITH DIFFERENTIAL/PLATELET
BASO%: 0.2 % (ref 0.0–2.0)
Basophils Absolute: 0 10*3/uL (ref 0.0–0.1)
EOS%: 4.1 % (ref 0.0–7.0)
Eosinophils Absolute: 0.4 10*3/uL (ref 0.0–0.5)
HCT: 39 % (ref 34.8–46.6)
HGB: 12.9 g/dL (ref 11.6–15.9)
LYMPH%: 23.5 % (ref 14.0–49.7)
MCH: 27.6 pg (ref 25.1–34.0)
MCHC: 33.1 g/dL (ref 31.5–36.0)
MCV: 83.5 fL (ref 79.5–101.0)
MONO#: 0.8 10*3/uL (ref 0.1–0.9)
MONO%: 8.4 % (ref 0.0–14.0)
NEUT#: 6 10*3/uL (ref 1.5–6.5)
NEUT%: 63.8 % (ref 38.4–76.8)
Platelets: 298 10*3/uL (ref 145–400)
RBC: 4.67 10*6/uL (ref 3.70–5.45)
RDW: 13.2 % (ref 11.2–14.5)
WBC: 9.4 10*3/uL (ref 3.9–10.3)
lymph#: 2.2 10*3/uL (ref 0.9–3.3)

## 2017-03-17 LAB — COMPREHENSIVE METABOLIC PANEL
ALT: 39 U/L (ref 0–55)
AST: 30 U/L (ref 5–34)
Albumin: 3.8 g/dL (ref 3.5–5.0)
Alkaline Phosphatase: 69 U/L (ref 40–150)
Anion Gap: 9 mEq/L (ref 3–11)
BUN: 12.8 mg/dL (ref 7.0–26.0)
CO2: 22 mEq/L (ref 22–29)
Calcium: 9.3 mg/dL (ref 8.4–10.4)
Chloride: 107 mEq/L (ref 98–109)
Creatinine: 0.7 mg/dL (ref 0.6–1.1)
EGFR: >60 mL/min/{1.73_m2} (ref >60)
Glucose: 102 mg/dl (ref 70–140)
Potassium: 4.2 mEq/L (ref 3.5–5.1)
Sodium: 138 mEq/L (ref 136–145)
Total Bilirubin: 0.37 mg/dL (ref 0.20–1.20)
Total Protein: 7.1 g/dL (ref 6.4–8.3)

## 2017-03-17 MED ORDER — IOPAMIDOL (ISOVUE-300) INJECTION 61%
75.0000 mL | Freq: Once | INTRAVENOUS | Status: AC | PRN
  Administered 2017-03-17: 75 mL via INTRAVENOUS

## 2017-03-17 MED ORDER — ANASTROZOLE 1 MG PO TABS: 1.0000 mg | ORAL_TABLET | Freq: Every day | ORAL | 4 refills | Status: DC

## 2017-03-17 MED ORDER — IOPAMIDOL (ISOVUE-300) INJECTION 61%
INTRAVENOUS | Status: AC
  Administered 2017-03-17: 75 mL via INTRAVENOUS
  Filled 2017-03-17: qty 75

## 2017-03-18 ENCOUNTER — Ambulatory Visit (HOSPITAL_BASED_OUTPATIENT_CLINIC_OR_DEPARTMENT_OTHER): Payer: BLUE CROSS/BLUE SHIELD | Admitting: Oncology

## 2017-03-18 ENCOUNTER — Other Ambulatory Visit: Payer: Self-pay | Admitting: Oncology

## 2017-03-18 ENCOUNTER — Telehealth: Payer: Self-pay | Admitting: Oncology

## 2017-03-18 ENCOUNTER — Telehealth: Payer: Self-pay | Admitting: *Deleted

## 2017-03-18 VITALS — BP 148/71 | HR 98 | Temp 98.2°F | Resp 20 | Ht 64.0 in | Wt 207.4 lb

## 2017-03-18 DIAGNOSIS — K760 Fatty (change of) liver, not elsewhere classified: Secondary | ICD-10-CM

## 2017-03-18 DIAGNOSIS — C50312 Malignant neoplasm of lower-inner quadrant of left female breast: Secondary | ICD-10-CM

## 2017-03-18 DIAGNOSIS — C773 Secondary and unspecified malignant neoplasm of axilla and upper limb lymph nodes: Secondary | ICD-10-CM

## 2017-03-18 DIAGNOSIS — Z170 Estrogen receptor positive status [ER+]: Secondary | ICD-10-CM | POA: Diagnosis not present

## 2017-03-18 DIAGNOSIS — C7801 Secondary malignant neoplasm of right lung: Secondary | ICD-10-CM | POA: Diagnosis not present

## 2017-03-18 NOTE — Progress Notes (Signed)
  Oncology Nurse Navigator Documentation  Navigator Location: [facility] (03/18/17 0930)   )Navigator Encounter Type: Telephone (03/18/17 0930)  Patient left a voice mail this morning because she had received a call from Dr. Jana Hakim to inform her that it appears that her cancer has recurred in her lung.  She was very upset and asked me to call.  I spoke to Dr. Jana Hakim who had recommended that she come see him to discuss and review her scan.  I called patient and confirmed that she was coming.  I met with patient after her appointment with Dr. Jana Hakim and discussed the plan and answered her questions.  She called me back because they could not schedule her PET scan until 03/30/17 but offered to schedule her earlier at [facility].  I encouraged her to have her scan at [facility].  Patient still "freaked out" about today's events and her prognosis.  I encouraged her to focus right now on getting the PET scan so that we have more information and can begin treating her cancer and to call me for any questions, concerns or even if just to talk.                      Treatment Phase: Abnormal Scans (03/18/17 0930) Barriers/Navigation Needs: Coordination of Care;Education;Family concerns (03/18/17 0930) Education: Understanding Cancer/ Treatment Options;Coping with Diagnosis/ Prognosis (03/18/17 0930) Interventions: Coordination of Care (03/18/17 0930)                      Time Spent with Patient: 45 (03/18/17 0930)

## 2017-03-18 NOTE — Progress Notes (Signed)
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 The right breast was unremarkable. On 06/30/2013 additional views of the left breast showed an irregular spiculated dense mass measuring 2.2 cm in the lower inner quadrant. This was palpable at the 8:00 location. Ultrasound showed an irregular hypoechoic mass measuring 2.3 cm and in the left axilla a dominant abnormal appearing lymph node with cortical thickening measuring 1.2 cm.  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  [id number] the patient underwent bilateral breast MRI. This showed her breasts to be dense (composition C.). In the left breast at the 8:00 position there was a 2.4 cm enhancing mass containing a biopsy clip. There were no additional areas of concern. In the axilla on the left there was a 2.6 cm enlarged left axillary lymph node. There was also a 0.5 cm left internal mammary lymph nodes noted. The right side was unremarkable.  The patient's subsequent history is as detailed below.  INTERVAL HISTORY: Debbie [name] returns today for follow-up  of her estrogen receptor positive breast cancer accompanied by her neighbor and friend.. She will start on anastrozole today.   Since her last visit to the office, she has had a CT Chest completed on 03/17/2017 with results concerning for metastatic disease. She is here today to discuss results.   REVIEW OF SYSTEMS: [name] denies unusual headaches, visual changes, nausea, vomiting, or dizziness. There has been no unusual cough, phlegm production, or pleurisy. This been no change in bowel or bladder habits. She denies unexplained fatigue or unexplained weight loss, bleeding, rash, or fever. A detailed review of systems was otherwise stable.      PAST MEDICAL HISTORY: Past Medical History:  Diagnosis Date  . Abnormal Pap smear   . Anxiety   . Breast cancer ([facility]) 2015   ER+/PR+/Her2-  . Hyperlipidemia   . IBS (irritable bowel syndrome)   . IBS (irritable bowel syndrome)   . Menorrhagia   . Migraines    menstral  . Migraines   . MVP (mitral valve prolapse)    echo 2/15-mitral valve normal-no regurg,no stenosis  . Radiation 02/14/14-04/03/14   Left Breast  . Seasonal allergies     PAST SURGICAL HISTORY: Past Surgical History:  Procedure Laterality Date  . ENDOMETRIAL ABLATION    . PORT-A-CATH REMOVAL N/A 05/15/2014   Procedure: REMOVAL PORT-A-CATH;  Surgeon: [name], MD;  Location: [facility];  Service: General;  Laterality: N/A;  . PORTACATH PLACEMENT Left 07/18/2013   Procedure: INSERTION PORT-A-CATH;  Surgeon: [name], MD;  Location: [facility];  Service: General;  Laterality: Left;  . SVD      x 3  .  VULVA /PERINEUM BIOPSY  2011  . WISDOM TEETH REMOVAL      FAMILY HISTORY Family History  Problem Relation Age of Onset  . Cancer Maternal Grandmother        BREAST AND UTERINE CANCER  . Uterine cancer Maternal Grandmother 84  . Hyperlipidemia Mother   . Hypertension Mother   . Multiple myeloma Father 58  . Lung cancer Maternal Manufacturing engineer   . Testicular cancer Other 33  The patient's father is living, currently age 77. He has a history of multiple myeloma. The patient's mother is 18.  She was diagnosed with ductal carcinoma in situ in 2016.The patient's parents live in [place] The patient had no brothers, 2 sisters. There is no history of breast or ovarian cancer in the family. She believes one of her grandfathers died from lung cancer and another from pancreatic cancer, but is not sure  GYNECOLOGIC HISTORY:  Menarche age 51, first live birth age 51, which the patient understands is an independent risk factor for breast cancer. She is GX P3. She was still having regular periods at the start of chemotherapy. LMP was March 2015. She tells me Dr. Cletis Media checked her labs December 2015 and found that she was in the menopausal range. 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 Her husband [name] is a Energy manager at [employer]. Her 3 children are [name] (16), [name] (12) and [name] (9). The patient attends a local [place]: Not in place   HEALTH MAINTENANCE: Social History  Substance Use Topics  . Smoking status: Never Smoker  . Smokeless tobacco: Never Used  . Alcohol use No     Colonoscopy: January 2015  AJO:INOMVEHM 2014  Bone density:  Lipid panel:  Allergies  Allergen Reactions  . Dust Mite Extract Itching    Current Outpatient Prescriptions  Medication Sig Dispense Refill  . anastrozole (ARIMIDEX) 1 MG tablet Take 1 tablet (1 mg total) by mouth daily. 90 tablet 4  . cetirizine (ZYRTEC) 10 MG tablet Take 10 mg by mouth daily.    . frovatriptan (FROVA) 2.5 MG tablet Take 1 tablet (2.5 mg total) by mouth as needed for migraine. If recurs, may repeat after 2 hours. Max of 3 tabs in 24 hours. 10 tablet 0  . mometasone (NASONEX) 50 MCG/ACT nasal spray Place 2  sprays into the nose daily as needed (for allergies).     Marland Kitchen omeprazole (PRILOSEC) 20 MG capsule Take 1 capsule (20 mg total) by mouth 2 (two) times daily before a meal. 60 capsule 3  . oxymetazoline (AFRIN) 0.05 % nasal spray Place 2 sprays into the nose daily as needed. congestion    . PARoxetine (PAXIL) 20 MG tablet Take 20 mg by mouth every morning.    . Vitamin D, Ergocalciferol, (DRISDOL) 50000 UNITS CAPS capsule Take 50,000 Units by mouth every 7 (seven) days.      No current facility-administered medications for this visit.     OBJECTIVE: Middle-aged white woman Who appears stated age 80:   03/18/17 1258  BP: (!) 148/71  Pulse: 98  Resp: 20  Temp: 98.2 F (36.8 C)  SpO2: 96%     Body mass index is 35.6 kg/m.    ECOG FS:1 - Symptomatic but completely ambulatory  Exam not repeated today  Component Value Date/Time   NA 138 03/17/2017 1245   K 4.2 03/17/2017 1245   CO2 22 03/17/2017 1245   GLUCOSE 102 03/17/2017 1245   BUN 12.8 03/17/2017 1245   CREATININE 0.7 03/17/2017 1245   CALCIUM 9.3 03/17/2017 1245   PROT 7.1 03/17/2017 1245   ALBUMIN 3.8 03/17/2017 1245   AST 30 03/17/2017 1245   ALT 39 03/17/2017 1245   ALKPHOS 69 03/17/2017 1245   BILITOT 0.37 03/17/2017 1245    I No results found for: SPEP  Lab Results  Component Value Date   WBC 9.4 03/17/2017   NEUTROABS 6.0 03/17/2017   HGB 12.9 03/17/2017   HCT 39.0 03/17/2017   MCV 83.5 03/17/2017   PLT 298 03/17/2017      Chemistry      Component Value Date/Time   NA 138 03/17/2017 1245   K 4.2 03/17/2017 1245   CO2 22 03/17/2017 1245   BUN 12.8 03/17/2017 1245   CREATININE 0.7 03/17/2017 1245      Component Value Date/Time   CALCIUM 9.3 03/17/2017 1245   ALKPHOS 69 03/17/2017 1245   AST 30 03/17/2017 1245   ALT 39 03/17/2017 1245   BILITOT 0.37 03/17/2017 1245       No results found for: LABCA2  No components found for: LABCA125  No results for input(s): INR in the last 168  hours.  Urinalysis    Component Value Date/Time   BILIRUBINUR Negative 09/22/2011 1451   PROTEINUR Trace 09/22/2011 1451   UROBILINOGEN negative 09/22/2011 1451   NITRITE Negative 09/22/2011 1451   LEUKOCYTESUR Trace 09/22/2011 1451    STUDIES: CT Chest on 03/17/17 with results that showed: 1. Nodular and masslike pleural thickening throughout the left hemithorax with a small left pleural effusion, consistent with metastatic disease. 2. Enlarging AP window lymph node, worrisome for metastatic disease. 3. Hepatic steatosis.   ASSESSMENT: 51 y.o. [place] woman status post left breast lower inner quadrant and left axillary lymph node biopsy 06/30/2013 for a clinical T2 N1-2, stage IIB/IIIA invasive ductal carcinoma, grade 2, estrogen and progesterone receptors both strongly positive, HER-2 nonamplified, with an MIB-1 of 36%.  (1) there is a 5 mm left internal mammary lymph node which was negative on PET scan  (2) cyclophosphamide and doxorubicin started 08/04/2014, given in dose dense fashion x4 with Neulasta day 2; completed 09/14/2013, followed by paclitaxel again given in dose dense fashion x4, completed 11/09/2013  (3) left lumpectomy and axillary lymph node dissection 12/13/2013 at [facility] showed a residual pT2 pN1 (mic), stage IIB invasive ductal carcinoma; margins positive for DCIS cleared with subsequent surgery (01/03/2014)  (4) adjuvant radiation therapy completed 04/03/2014  (5) tamoxifen started December 2015, discontinued October 2018  (6) hepatic steatosis: documented on abdomianl US 08/07/2014, with elevated but stable LFTs,  (7) genetic testing 07/24/2013 through the  "Women's Hereditary Panel", performed at [facility], found no deleterious mutations in  ATM, BRCA1, BRCA2, BRIP1, CDH1, CHEK2 (c.1100delC only), EPCAM, MLH1, MSH2, MSH6, NBN, PALB2, PMS2, PTEN, RAD51C, STK11, and TP53.  (8) anastrozole started October 2018  PLAN: I spent approximately  35 minutes with Kelie today going over her new situation. Reizel Almost certainly has metastatic recurrence of her breast cancer to her left lung. I give her a copy of the report and we went over it in detail.  She understands she does not have lung cancer, but breast cancer in the lung.  She understands that she very likely has microscopic deposits of breast cancer  in other parts of her body although they may not be fine double at present. We are going to obtain a PET scan and hopefully within the next week and that should not only help [place] find some of those spots, if present, but also perhaps suggest a more accessible area to biopsy.  We will need a biopsy of this tissue not only to confirm that we are dealing with recurrent breast cancer but also to find out if it is still estrogen receptor positive and whether it has now become HER-2 positive  She is going to see me again 04/02/2017. Hopefully by then we will have a information we need. If this is still estrogen receptor positive I will treat her with anastrozole and palbociclib.  Interestingly, the back pain she was experiencing had pretty much resolved after yesterday's visit.  Since his situation has changed dramatically and since she will no longer be on tamoxifen, I don't see an indication for hysterectomy and I believe that is being canceled  She knows to call for any other issues that may develop before the next visit.  Chauncey Cruel, MD 03/18/17  1:14 PM Medical Oncology and Hematology [facility] [address] [place], [place] [address] Tel. [phone] Fax. [phone]  This document serves as a record of services personally performed by Lurline Del, MD. It was created on her behalf by Steva Colder, a trained medical scribe. The creation of this record is based on the scribe's personal observations and the provider's statements to them. This document has been checked and approved by the attending  provider.

## 2017-03-18 NOTE — Progress Notes (Signed)
I called Debbie [name] and gave her the results of her CT scan. I am setting her up for a PET scan. I offered her to visit today at 12:30 she wanted to look at the images there did otherwise I will see her right after the PET scan.

## 2017-03-18 NOTE — Telephone Encounter (Signed)
Scheduled appt per 10/18 sch msg. Patient is aware of appt.

## 2017-03-22 ENCOUNTER — Telehealth: Payer: Self-pay

## 2017-03-22 ENCOUNTER — Other Ambulatory Visit: Payer: Self-pay | Admitting: Oncology

## 2017-03-22 MED ORDER — ALPRAZOLAM 0.5 MG PO TABS: ORAL_TABLET | ORAL | 0 refills | Status: DC

## 2017-03-22 NOTE — Telephone Encounter (Signed)
Xanax prescription faxed to [facility] on [facility] per pt request

## 2017-03-24 ENCOUNTER — Other Ambulatory Visit: Payer: Self-pay | Admitting: Oncology

## 2017-03-24 ENCOUNTER — Encounter
Admission: RE | Admit: 2017-03-24 | Discharge: 2017-03-24 | Disposition: A | Discharge status: Home / Self Care | Payer: BLUE CROSS/BLUE SHIELD | Source: Ambulatory Visit | Attending: Oncology | Admitting: Oncology

## 2017-03-24 DIAGNOSIS — Z170 Estrogen receptor positive status [ER+]: Secondary | ICD-10-CM | POA: Insufficient documentation

## 2017-03-24 DIAGNOSIS — C7800 Secondary malignant neoplasm of unspecified lung: Secondary | ICD-10-CM | POA: Insufficient documentation

## 2017-03-24 DIAGNOSIS — C50912 Malignant neoplasm of unspecified site of left female breast: Secondary | ICD-10-CM | POA: Diagnosis not present

## 2017-03-24 DIAGNOSIS — C50312 Malignant neoplasm of lower-inner quadrant of left female breast: Secondary | ICD-10-CM | POA: Diagnosis not present

## 2017-03-24 DIAGNOSIS — C7802 Secondary malignant neoplasm of left lung: Secondary | ICD-10-CM

## 2017-03-24 LAB — GLUCOSE, CAPILLARY: Glucose-Capillary: 118 mg/dL — ABNORMAL HIGH (ref 65–99)

## 2017-03-24 MED ORDER — FLUDEOXYGLUCOSE F - 18 (FDG) INJECTION
12.0000 | Freq: Once | INTRAVENOUS | Status: AC | PRN
  Administered 2017-03-24: 12.63 via INTRAVENOUS

## 2017-03-25 ENCOUNTER — Encounter: Payer: Self-pay | Admitting: Skilled Nursing Facility1

## 2017-03-25 ENCOUNTER — Encounter: Payer: BLUE CROSS/BLUE SHIELD | Attending: Family Medicine | Admitting: Skilled Nursing Facility1

## 2017-03-25 DIAGNOSIS — Z713 Dietary counseling and surveillance: Secondary | ICD-10-CM | POA: Insufficient documentation

## 2017-03-25 DIAGNOSIS — E119 Type 2 diabetes mellitus without complications: Secondary | ICD-10-CM | POA: Diagnosis not present

## 2017-03-25 NOTE — Progress Notes (Signed)
  Assessment:  Primary concerns today: referred for diabetes.   Pt states she Found out she had cancer Thursday and since then has had no sugar when she used to drink a 32 ounce of Dr. Malachi Bonds every day and candy. Pt had many questions about cancer myths which dietitian answered through evidence based research as well as asking if she needed to go on the keto diet. Pt thought she needed to go on a specific diet which as explained to be counterintuitive to her health.  Dietitian advised pt to follow a plant based diet which pt met with resistance so dietitian tailored her a plan she felt confident in accomplishing.   MEDICATIONS: See List   DIETARY INTAKE:  Usual eating pattern includes3 meals and2 snacks per day.  Everyday foods include none stated.  Avoided foods include none stated.    24-hr recall:  B ( AM): skipped or eggs with bacon or nuts Snk ( AM):  fruit L ( PM): salad with grilled chicken  Snk ( PM): nuts D ( PM): grilled chicken or fish and vegetables  Snk ( PM):  Beverages: water with lemon 75 ounces   Usual physical activity: ADl's  Estimated energy needs: 1600 calories 180 g carbohydrates 120 g protein 44 g fat  Progress Towards Goal(s):  In progress.   Nutritional Diagnosis:  NB-1.1 Food and nutrition-related knowledge deficit As related to newly diagnosed diabetes.  As evidenced by referral.    Intervention:  Nutrition counsled pt on plant based diet. Goals: Breakfast Ideas:   FiberOne cereal or any other adult cereal with SWEETENED soy   Milk For nutrition facts label:  -Ignore the percents  -9 grams or less of fat 9 grams of less of sugar  -some grams of protein  -Fiber as high as you can find -Have plant based 2 days a week, fish 2 days a week, chicken/turkey 2 days a week, and beef/pork 1 day a week  Teaching Method Utilized:  Visual Auditory Hands on  Handouts given during visit include:  Plant based proteins  Detailed MyPlate  Barriers to  learning/adherence to lifestyle change: general resistance to the diet healthiest for diabetes/cancer   Demonstrated degree of understanding via:  Teach Back   Monitoring/Evaluation:  Dietary intake, exercise, and body weight prn.

## 2017-03-25 NOTE — Patient Instructions (Signed)
Breakfast Ideas:    FiberOne cereal or any other adult cereal with SWEETENED soy   Milk   For nutrition facts label:   -Ignore the percents  -9 grams or less of fat 9 grams of less of sugar  -some grams of protein  -Fiber as high as you can find

## 2017-03-26 ENCOUNTER — Other Ambulatory Visit: Payer: Self-pay | Admitting: *Deleted

## 2017-03-29 DIAGNOSIS — H40011 Open angle with borderline findings, low risk, right eye: Secondary | ICD-10-CM | POA: Diagnosis not present

## 2017-03-29 DIAGNOSIS — H40012 Open angle with borderline findings, low risk, left eye: Secondary | ICD-10-CM | POA: Diagnosis not present

## 2017-03-29 DIAGNOSIS — H40013 Open angle with borderline findings, low risk, bilateral: Secondary | ICD-10-CM | POA: Diagnosis not present

## 2017-03-29 DIAGNOSIS — H25013 Cortical age-related cataract, bilateral: Secondary | ICD-10-CM | POA: Diagnosis not present

## 2017-03-29 DIAGNOSIS — H2513 Age-related nuclear cataract, bilateral: Secondary | ICD-10-CM | POA: Diagnosis not present

## 2017-03-29 DIAGNOSIS — E119 Type 2 diabetes mellitus without complications: Secondary | ICD-10-CM | POA: Diagnosis not present

## 2017-03-30 ENCOUNTER — Other Ambulatory Visit (HOSPITAL_BASED_OUTPATIENT_CLINIC_OR_DEPARTMENT_OTHER): Payer: BLUE CROSS/BLUE SHIELD

## 2017-03-30 ENCOUNTER — Telehealth: Payer: Self-pay | Admitting: *Deleted

## 2017-03-30 ENCOUNTER — Ambulatory Visit (HOSPITAL_COMMUNITY): Payer: BLUE CROSS/BLUE SHIELD

## 2017-03-30 DIAGNOSIS — Z170 Estrogen receptor positive status [ER+]: Secondary | ICD-10-CM

## 2017-03-30 DIAGNOSIS — C773 Secondary and unspecified malignant neoplasm of axilla and upper limb lymph nodes: Secondary | ICD-10-CM

## 2017-03-30 DIAGNOSIS — C50312 Malignant neoplasm of lower-inner quadrant of left female breast: Secondary | ICD-10-CM

## 2017-03-30 LAB — CBC WITH DIFFERENTIAL/PLATELET
BASO%: 0.8 % (ref 0.0–2.0)
Basophils Absolute: 0.1 10*3/uL (ref 0.0–0.1)
EOS%: 2.6 % (ref 0.0–7.0)
Eosinophils Absolute: 0.2 10*3/uL (ref 0.0–0.5)
HCT: 41.8 % (ref 34.8–46.6)
HGB: 14 g/dL (ref 11.6–15.9)
LYMPH%: 23.2 % (ref 14.0–49.7)
MCH: 27.7 pg (ref 25.1–34.0)
MCHC: 33.4 g/dL (ref 31.5–36.0)
MCV: 82.9 fL (ref 79.5–101.0)
MONO#: 0.7 10*3/uL (ref 0.1–0.9)
MONO%: 8 % (ref 0.0–14.0)
NEUT#: 6 10*3/uL (ref 1.5–6.5)
NEUT%: 65.4 % (ref 38.4–76.8)
Platelets: 302 10*3/uL (ref 145–400)
RBC: 5.05 10*6/uL (ref 3.70–5.45)
RDW: 13.3 % (ref 11.2–14.5)
WBC: 9.2 10*3/uL (ref 3.9–10.3)
lymph#: 2.1 10*3/uL (ref 0.9–3.3)

## 2017-03-30 LAB — COMPREHENSIVE METABOLIC PANEL
ALT: 38 U/L (ref 0–55)
AST: 28 U/L (ref 5–34)
Albumin: 4.2 g/dL (ref 3.5–5.0)
Alkaline Phosphatase: 65 U/L (ref 40–150)
Anion Gap: 10 mEq/L (ref 3–11)
BUN: 14.1 mg/dL (ref 7.0–26.0)
CO2: 21 mEq/L — ABNORMAL LOW (ref 22–29)
Calcium: 9.8 mg/dL (ref 8.4–10.4)
Chloride: 109 mEq/L (ref 98–109)
Creatinine: 0.7 mg/dL (ref 0.6–1.1)
EGFR: >60 mL/min/{1.73_m2} (ref >60)
Glucose: 98 mg/dl (ref 70–140)
Potassium: 4.2 mEq/L (ref 3.5–5.1)
Sodium: 140 mEq/L (ref 136–145)
Total Bilirubin: 0.4 mg/dL (ref 0.20–1.20)
Total Protein: 7.5 g/dL (ref 6.4–8.3)

## 2017-03-30 NOTE — Progress Notes (Signed)
  Oncology Nurse Navigator Documentation  Navigator Location: [facility] (03/30/17 1045)   )Navigator Encounter Type: Telephone (03/30/17 1045)  Patient left a voice mail to check to see if she needed to keep her appointment with Dr. Jana Hakim on Friday since she is scheduled for a biopsy on 04/06/17.  I discussed with Dr. [name] nurse and we rescheduled her appointment to 04/07/17.  I called patient back with information and reviewed the schedule and instructions for her biopsy.  Patient nervous about the biopsy and anxious to begin treatment so the cancer does not continue to grow.  I encouraged her to call me for any needs.                     Treatment Phase: Abnormal Scans (03/30/17 1045) Barriers/Navigation Needs: Coordination of Care (03/30/17 1045)   Interventions: Coordination of Care (03/30/17 1045)                      Time Spent with Patient: 15 (03/30/17 1045)

## 2017-03-31 ENCOUNTER — Other Ambulatory Visit: Payer: Self-pay | Admitting: Oncology

## 2017-03-31 LAB — CANCER ANTIGEN 27.29: CA 27.29: 86.3 U/mL — ABNORMAL HIGH (ref 0.0–38.6)

## 2017-03-31 NOTE — Progress Notes (Unsigned)
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 The right breast was unremarkable. On 06/30/2013 additional views of the left breast showed an irregular spiculated dense mass measuring 2.2 cm in the lower inner quadrant. This was palpable at the 8:00 location. Ultrasound showed an irregular hypoechoic mass measuring 2.3 cm and in the left axilla a dominant abnormal appearing lymph node with cortical thickening measuring 1.2 cm.  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  [id number] the patient underwent bilateral breast MRI. This showed her breasts to be dense (composition C.). In the left breast at the 8:00 position there was a 2.4 cm enhancing mass containing a biopsy clip. There were no additional areas of concern. In the axilla on the left there was a 2.6 cm enlarged left axillary lymph node. There was also a 0.5 cm left internal mammary lymph nodes noted. The right side was unremarkable.  The patient's subsequent history is as detailed below.  INTERVAL HISTORY: Debbie [name] returns today for follow-up  of her estrogen receptor positive breast cancer accompanied by her neighbor and friend.. She will start on anastrozole today.   Since her last visit to the office, she has had a CT Chest completed on 03/17/2017 with results concerning for metastatic disease. She is here today to discuss results.   REVIEW OF SYSTEMS: Debbie [name] denies unusual headaches, visual changes, nausea, vomiting, or dizziness. There has been no unusual cough, phlegm production, or pleurisy. This been no change in bowel or bladder habits. She denies unexplained fatigue or unexplained weight loss, bleeding, rash, or fever. A detailed review of systems was otherwise stable.      PAST MEDICAL HISTORY: Past Medical History:  Diagnosis Date  . Abnormal Pap smear   . Anxiety   . Breast cancer ([place]) 2015   ER+/PR+/Her2-  . Diabetes mellitus without complication ([place])   . Hyperlipidemia   . IBS (irritable bowel syndrome)   . IBS (irritable bowel syndrome)   . Menorrhagia   . Migraines    menstral  . Migraines   . MVP (mitral valve prolapse)    echo 2/15-mitral valve normal-no regurg,no stenosis  . Radiation 02/14/14-04/03/14   Left Breast  . Seasonal allergies     PAST SURGICAL HISTORY: Past Surgical History:  Procedure Laterality Date  . ENDOMETRIAL ABLATION    . PORT-A-CATH REMOVAL N/A 05/15/2014   Procedure: REMOVAL PORT-A-CATH;  Surgeon: [name], MD;  Location: [facility];  Service: General;  Laterality: N/A;  . PORTACATH PLACEMENT Left 07/18/2013   Procedure: INSERTION PORT-A-CATH;  Surgeon: [name], MD;  Location: [facility];  Service: General;  Laterality: Left;  . SVD  x 3  . VULVA [place] BIOPSY  2011  . WISDOM TEETH REMOVAL      FAMILY HISTORY Family History  Problem Relation Age of Onset  . Cancer Maternal Grandmother        BREAST AND UTERINE CANCER  . Uterine cancer Maternal Grandmother 84  . Hyperlipidemia Mother   . Hypertension Mother   . Multiple myeloma Father 18   . Lung cancer Maternal Manufacturing engineer  . Testicular cancer Other 58  The patient's father is living, currently age 22. He has a history of multiple myeloma. The patient's mother is 69.  She was diagnosed with ductal carcinoma in situ in 2016.The patient's parents live in [place] The patient had no brothers, 2 sisters. There is no history of breast or ovarian cancer in the family. She believes one of her grandfathers died from lung cancer and another from pancreatic cancer, but is not sure  GYNECOLOGIC HISTORY:  Menarche age 69, first live birth age 63, which the patient understands is an independent risk factor for breast cancer. She is GX P3. She was still having regular periods at the start of chemotherapy. LMP was March 2015. She tells me Dr. Cletis Media checked her labs December 2015 and found that she was in the menopausal range. 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 Her husband Debbie [name] is a Energy manager at [employer]. Her 3 children are Debbie [name] (16), Debbie [name] (12) and Debbie [name] (9). The patient attends a local [place]: Not in place   HEALTH MAINTENANCE: Social History  Substance Use Topics  . Smoking status: Never Smoker  . Smokeless tobacco: Never Used  . Alcohol use No     Colonoscopy: January 2015  NFA:OZHYQMVH 2014  Bone density:  Lipid panel:  Allergies  Allergen Reactions  . Dust Mite Extract Itching    Current Outpatient Prescriptions  Medication Sig Dispense Refill  . ALPRAZolam (XANAX) 0.5 MG tablet Take 20 minutes before MRI; may repeat once 5 tablet 0  . anastrozole (ARIMIDEX) 1 MG tablet Take 1 tablet (1 mg total) by mouth daily. 90 tablet 4  . cetirizine (ZYRTEC) 10 MG tablet Take 10 mg by mouth daily.    . frovatriptan (FROVA) 2.5 MG tablet Take 1 tablet (2.5 mg total) by mouth as needed for  migraine. If recurs, may repeat after 2 hours. Max of 3 tabs in 24 hours. 10 tablet 0  . mometasone (NASONEX) 50 MCG/ACT nasal spray Place 2 sprays into the nose daily as needed (for allergies).     Marland Kitchen omeprazole (PRILOSEC) 20 MG capsule Take 1 capsule (20 mg total) by mouth 2 (two) times daily before a meal. 60 capsule 3  . oxymetazoline (AFRIN) 0.05 % nasal spray Place 2 sprays into the nose daily as needed. congestion    . PARoxetine (PAXIL) 20 MG tablet Take 20 mg by mouth every morning.    . Vitamin D, Ergocalciferol, (DRISDOL) 50000 UNITS CAPS capsule Take 50,000 Units by mouth every 7 (seven) days.      No current facility-administered medications for this visit.     OBJECTIVE: Middle-aged white woman Who appears stated age There were no vitals filed for this visit.   There is no height or weight on file to calculate BMI.    ECOG FS:1 - Symptomatic but completely ambulatory  Exam not repeated today      Component Value Date/Time   NA 140 03/30/2017 1256   K 4.2 03/30/2017 1256   CO2 21 (L) 03/30/2017 1256   GLUCOSE 98 03/30/2017 1256   BUN 14.1 03/30/2017 1256   CREATININE 0.7 03/30/2017 1256   CALCIUM 9.8 03/30/2017 1256   PROT 7.5 03/30/2017 1256   ALBUMIN 4.2 03/30/2017 1256   AST 28 03/30/2017 1256   ALT 38 03/30/2017 1256   ALKPHOS 65 03/30/2017 1256   BILITOT 0.40 03/30/2017 1256    I No results found for: SPEP  Lab Results  Component Value Date   WBC 9.2 03/30/2017   NEUTROABS 6.0 03/30/2017   HGB 14.0 03/30/2017   HCT 41.8 03/30/2017   MCV 82.9 03/30/2017   PLT 302 03/30/2017      Chemistry      Component Value Date/Time   NA 140 03/30/2017 1256   K 4.2 03/30/2017 1256   CO2 21 (L) 03/30/2017 1256   BUN 14.1 03/30/2017 1256   CREATININE 0.7 03/30/2017 1256      Component Value Date/Time   CALCIUM 9.8 03/30/2017 1256   ALKPHOS 65 03/30/2017 1256   AST 28 03/30/2017 1256   ALT 38 03/30/2017 1256   BILITOT 0.40 03/30/2017 1256       No  results found for: LABCA2  No components found for: LABCA125  No results for input(s): INR in the last 168 hours.  Urinalysis    Component Value Date/Time   BILIRUBINUR Negative 09/22/2011 1451   PROTEINUR Trace 09/22/2011 1451   UROBILINOGEN negative 09/22/2011 1451   NITRITE Negative 09/22/2011 1451   LEUKOCYTESUR Trace 09/22/2011 1451    STUDIES: CT Chest on 03/17/17 with results that showed: 1. Nodular and masslike pleural thickening throughout the left hemithorax with a small left pleural effusion, consistent with metastatic disease. 2. Enlarging AP window lymph node, worrisome for metastatic disease. 3. Hepatic steatosis.   ASSESSMENT: 51 y.o. [place] woman status post left breast lower inner quadrant and left axillary lymph node biopsy 06/30/2013 for a clinical T2 N1-2, stage IIB/IIIA invasive ductal carcinoma, grade 2, estrogen and progesterone receptors both strongly positive, HER-2 nonamplified, with an MIB-1 of 36%.  (1) there is a 5 mm left internal mammary lymph node which was negative on PET scan  (2) cyclophosphamide and doxorubicin started 08/04/2014, given in dose dense fashion x4 with Neulasta day 2; completed 09/14/2013, followed by paclitaxel again given in dose dense fashion x4, completed 11/09/2013  (3) left lumpectomy and axillary lymph node dissection 12/13/2013 at [facility] showed a residual pT2 pN1 (mic), stage IIB invasive ductal carcinoma; margins positive for DCIS cleared with subsequent surgery (01/03/2014)  (4) adjuvant radiation therapy completed 04/03/2014  (5) tamoxifen started December 2015, discontinued October 2018  (6) hepatic steatosis: documented on abdomianl US 08/07/2014, with elevated but stable LFTs,  (7) genetic testing 07/24/2013 through the  "Women's Hereditary Panel", performed at [facility], found no deleterious mutations in  ATM, BRCA1, BRCA2, BRIP1, CDH1, CHEK2 (c.1100delC only), EPCAM, MLH1, MSH2, MSH6, NBN, PALB2,  PMS2, PTEN, RAD51C, STK11, and TP53.  (8) anastrozole started October 2018  PLAN: I spent approximately 35 minutes with Marchele today going over her new situation. Aspen Almost certainly has metastatic recurrence of her breast cancer to her left lung. I give her a copy of the report and we went over it in detail.  She understands she does not have lung cancer, but breast cancer in the lung.  She  understands that she very likely has microscopic deposits of breast cancer in other parts of her body although they may not be fine double at present. We are going to obtain a PET scan and hopefully within the next week and that should not only help [place] find some of those spots, if present, but also perhaps suggest a more accessible area to biopsy.  We will need a biopsy of this tissue not only to confirm that we are dealing with recurrent breast cancer but also to find out if it is still estrogen receptor positive and whether it has now become HER-2 positive  She is going to see me again 04/02/2017. Hopefully by then we will have a information we need. If this is still estrogen receptor positive I will treat her with anastrozole and palbociclib.  Interestingly, the back pain she was experiencing had pretty much resolved after yesterday's visit.  Since his situation has changed dramatically and since she will no longer be on tamoxifen, I don't see an indication for hysterectomy and I believe that is being canceled  She knows to call for any other issues that may develop before the next visit.  Chauncey Cruel, MD 03/31/17  1:31 PM Medical Oncology and Hematology [facility] [address] [place], [place] [address] Tel. [phone] Fax. [phone]  This document serves as a record of services personally performed by Lurline Del, MD. It was created on her behalf by Steva Colder, a trained medical scribe. The creation of this record is based on the scribe's personal  observations and the provider's statements to them. This document has been checked and approved by the attending provider.

## 2017-04-02 ENCOUNTER — Ambulatory Visit: Payer: BLUE CROSS/BLUE SHIELD | Admitting: Oncology

## 2017-04-05 ENCOUNTER — Other Ambulatory Visit: Payer: Self-pay | Admitting: Radiology

## 2017-04-06 ENCOUNTER — Ambulatory Visit (HOSPITAL_COMMUNITY)
Admission: RE | Admit: 2017-04-06 | Discharge: 2017-04-06 | Disposition: A | Discharge status: Home / Self Care | Payer: BLUE CROSS/BLUE SHIELD | Source: Ambulatory Visit | Attending: Oncology | Admitting: Oncology

## 2017-04-06 ENCOUNTER — Encounter (HOSPITAL_COMMUNITY): Payer: Self-pay

## 2017-04-06 DIAGNOSIS — Z853 Personal history of malignant neoplasm of breast: Secondary | ICD-10-CM | POA: Diagnosis not present

## 2017-04-06 DIAGNOSIS — C782 Secondary malignant neoplasm of pleura: Secondary | ICD-10-CM | POA: Insufficient documentation

## 2017-04-06 DIAGNOSIS — C7802 Secondary malignant neoplasm of left lung: Secondary | ICD-10-CM | POA: Diagnosis not present

## 2017-04-06 DIAGNOSIS — C50919 Malignant neoplasm of unspecified site of unspecified female breast: Secondary | ICD-10-CM | POA: Diagnosis not present

## 2017-04-06 DIAGNOSIS — R918 Other nonspecific abnormal finding of lung field: Secondary | ICD-10-CM | POA: Diagnosis not present

## 2017-04-06 LAB — PROTIME-INR
INR: 1.01
Prothrombin Time: 13.2 seconds (ref 11.4–15.2)

## 2017-04-06 LAB — GLUCOSE, CAPILLARY: Glucose-Capillary: 116 mg/dL — ABNORMAL HIGH (ref 65–99)

## 2017-04-06 LAB — CBC
HCT: 39.2 % (ref 36.0–46.0)
Hemoglobin: 12.7 g/dL (ref 12.0–15.0)
MCH: 26.8 pg (ref 26.0–34.0)
MCHC: 32.4 g/dL (ref 30.0–36.0)
MCV: 82.9 fL (ref 78.0–100.0)
Platelets: 280 10*3/uL (ref 150–400)
RBC: 4.73 MIL/uL (ref 3.87–5.11)
RDW: 13.4 % (ref 11.5–15.5)
WBC: 6 10*3/uL (ref 4.0–10.5)

## 2017-04-06 LAB — APTT: aPTT: 24 seconds (ref 24–36)

## 2017-04-06 MED ORDER — SODIUM CHLORIDE 0.9 % IV SOLN: INTRAVENOUS | Status: DC

## 2017-04-06 MED ORDER — FENTANYL CITRATE (PF) 100 MCG/2ML IJ SOLN
INTRAMUSCULAR | Status: AC
  Filled 2017-04-06: qty 2

## 2017-04-06 MED ORDER — HYDROCODONE-ACETAMINOPHEN 5-325 MG PO TABS: 1.0000 | ORAL_TABLET | ORAL | Status: DC | PRN

## 2017-04-06 MED ORDER — FENTANYL CITRATE (PF) 100 MCG/2ML IJ SOLN
INTRAMUSCULAR | Status: AC | PRN
  Administered 2017-04-06: 50 ug via INTRAVENOUS
  Administered 2017-04-06: 25 ug via INTRAVENOUS

## 2017-04-06 MED ORDER — LIDOCAINE HCL 1 % IJ SOLN
INTRAMUSCULAR | Status: AC
  Filled 2017-04-06: qty 20

## 2017-04-06 MED ORDER — MIDAZOLAM HCL 2 MG/2ML IJ SOLN
INTRAMUSCULAR | Status: AC | PRN
  Administered 2017-04-06 (×2): 1 mg via INTRAVENOUS

## 2017-04-06 MED ORDER — MIDAZOLAM HCL 2 MG/2ML IJ SOLN
INTRAMUSCULAR | Status: AC
  Filled 2017-04-06: qty 2

## 2017-04-06 MED ORDER — SODIUM CHLORIDE 0.9 % IV SOLN
INTRAVENOUS | Status: AC | PRN
  Administered 2017-04-06: 10 mL/h via INTRAVENOUS

## 2017-04-06 NOTE — Sedation Documentation (Signed)
Patient denies pain and is resting comfortably.  

## 2017-04-06 NOTE — Sedation Documentation (Signed)
02 d/c 

## 2017-04-06 NOTE — Discharge Instructions (Addendum)
Lung Biopsy A lung biopsy is a procedure to remove a tissue sample from the lung. The tissue can be examined under a microscope to help diagnose various lung disorders. There are three types of lung biopsies:  Needle biopsy. In this type, the sample is removed with a needle.  Bronchoscopy. In this type, the sample is removed through a flexible tube inserted into the lungs (bronchoscope).  Open biopsy. In this type, the sample is removed through an incision made in the chest.  Tell a health care provider about:  Any allergies you have.  All medicines you are taking, including vitamins, herbs, eye drops, creams, and over-the-counter medicines.  Any problems you or family members have had with anesthetic medicines.  Any blood disorders or bleeding problems that you have.  Any surgeries you have had.  Any medical conditions you have.  Whether you are pregnant or may be pregnant. What are the risks? Generally, this is a safe procedure. However, problems may occur, including:  Collapsed lung.  Bleeding.  Infection.  Pain.  What happens before the procedure? Staying hydrated Follow instructions from your health care provider about hydration, which may include:  Up to 2 hours before the procedure - you may continue to drink clear liquids, such as water, clear fruit juice, black coffee, and plain tea.  Eating and drinking restrictions Follow instructions from your health care provider about eating and drinking, which may include:  8 hours before the procedure - stop eating heavy meals or foods such as meat, fried foods, or fatty foods.  6 hours before the procedure - stop eating light meals or foods, such as toast or cereal.  6 hours before the procedure - stop drinking milk or drinks that contain milk.  2 hours before the procedure - stop drinking clear liquids.  General instructions  Ask your health care provider about: ? Changing or stopping your regular  medicines. This is especially important if you take diabetes medicines or blood thinners. ? Taking medicines such as aspirin and ibuprofen. These medicines can thin your blood. Do not take these medicines before your procedure if your health care provider instructs you not to.  Plan to have someone take you home from the hospital or clinic. What happens during the procedure?  To lower your risk of infection: ? Your health care team will wash or sanitize their hands. ? If you are having an open biopsy, your skin will be washed with soap.  An IV tube may be inserted into one of your veins.  You may be given one or both of the following: ? A medicine to help you relax (sedative) during the procedure. ? A medicine to numb the area where the biopsy sample will be taken (local anesthetic). ? A medicine to make you sleep through the procedure (general anesthetic).  If you have a needle biopsy: ? A biopsy needle will be inserted into your lung. A CT scanner may be used to guide the needle to the right place. ? The needle will be used to collect the tissue sample.  If you have bronchoscopy: ? A bronchoscope will be inserted into your lungs through your mouth or nose. ? A needle or forceps will be passed through the bronchoscope to remove the tissue sample.  If you have an open biopsy: ? An incision will be made in your chest. ? The tissue sample will be removed using surgical tools. ? The incision will be closed with skin glue, skin adhesive strips,  or stitches (sutures). The procedure may vary among health care providers and hospitals. What happens after the procedure?  Your blood pressure, heart rate, breathing rate, and blood oxygen level will be monitored until the medicines you were given have worn off.  If a needle biopsy was performed, a bandage (dressing) will be applied over the area where the needle was inserted. You may be asked to apply pressure to the bandage for several minutes  to ensure there is minimal bleeding.  If a bronchoscope was used, you may have a cough and some soreness in your throat.  Do not drive for 24 hours if you were given a sedative. Summary  A lung biopsy is a procedure to remove a tissue sample from your lung. The sample is examined to help diagnose various lung disorders.  A lung biopsy may be done using a needle, by inserting a tube into the lungs, or through an incision in the chest.  After your lung biopsy, you may have a cough and some throat soreness. This information is not intended to replace advice given to you by your health care provider. Make sure you discuss any questions you have with your health care provider. Document Released: 08/06/2004 Document Revised: 04/08/2016 Document Reviewed: 04/08/2016 Elsevier Interactive Patient Education  2017 [place] Biopsy, Care After This sheet gives you information about how to care for yourself after your procedure. Your health care provider may also give you more specific instructions depending on the type of biopsy you had. If you have problems or questions, contact your health care provider. What can I expect after the procedure? After the procedure, it is common to have:  A cough.  A sore throat.  Pain where a needle, bronchoscope, or incision was used to collect a biopsy sample (biopsy site).  Follow these instructions at home: Medicines  Take over-the-counter and prescription medicines only as told by your health care provider.  Do not drive for 24 hours if you were given a sedative.  Do not drink alcohol while taking pain medicine.  Do not drive or use heavy machinery while taking prescription pain medicine.  To prevent or treat constipation while you are taking prescription pain medicine, your health care provider may recommend that you: ? Drink enough fluid to keep your urine clear or pale yellow. ? Take over-the-counter or prescription medicines. ? Eat  foods that are high in fiber, such as fresh fruits and vegetables, whole grains, and beans. ? Limit foods that are high in fat and processed sugars, such as fried and sweet foods. Activity  If you had an incision during your procedure, avoid activities that may pull the incision site open.  Return to your normal activities as told by your health care provider. Ask your health care provider what activities are safe for you. If you had an open biopsy:   Follow instructions from your health care provider about how to take care of your incision. Make sure you: ? Wash your hands with soap and water before you change your bandage (dressing). If soap and water are not available, use hand sanitizer. ? Change your dressing as told by your health care provider. ? Leave stitches (sutures), skin glue, or adhesive strips in place. These skin closures may need to stay in place for 2 weeks or longer. If adhesive strip edges start to loosen and curl up, you may trim the loose edges. Do not remove adhesive strips completely unless your health care provider tells you  to do that.  Check your incision area every day for signs of infection. Check for: ? Redness, swelling, or pain. ? Fluid or blood. ? Warmth. ? Pus or a bad smell. General instructions  It is up to you to get the results of your procedure. Ask your health care provider, or the department that is doing the procedure, when your results will be ready. Contact a health care provider if:  You have a fever.  You have redness, swelling, or pain around your biopsy site.  You have fluid or blood coming from your biopsy site.  Your biopsy site feels warm to the touch.  You have pus or a bad smell coming from your biopsy site. Get help right away if:  You cough up blood.  You have trouble breathing.  You have chest pain. Summary  After the procedure, it is common to have a sore throat and a cough.  Return to your normal activities as told  by your health care provider. Ask your health care provider what activities are safe for you.  Take over-the-counter and prescription medicines only as told by your health care provider.  Report any unusual symptoms to your health care provider. This information is not intended to replace advice given to you by your health care provider. Make sure you discuss any questions you have with your health care provider. Document Released: 06/16/2016 Document Revised: 06/16/2016 Document Reviewed: 06/16/2016 Elsevier Interactive Patient Education  2018 [place]. Moderate Conscious Sedation, Adult, Care After These instructions provide you with information about caring for yourself after your procedure. Your health care provider may also give you more specific instructions. Your treatment has been planned according to current medical practices, but problems sometimes occur. Call your health care provider if you have any problems or questions after your procedure. What can I expect after the procedure? After your procedure, it is common:  To feel sleepy for several hours.  To feel clumsy and have poor balance for several hours.  To have poor judgment for several hours.  To vomit if you eat too soon.  Follow these instructions at home: For at least 24 hours after the procedure:   Do not: ? Participate in activities where you could fall or become injured. ? Drive. ? Use heavy machinery. ? Drink alcohol. ? Take sleeping pills or medicines that cause drowsiness. ? Make important decisions or sign legal documents. ? Take care of children on your own.  Rest. Eating and drinking  Follow the diet recommended by your health care provider.  If you vomit: ? Drink water, juice, or soup when you can drink without vomiting. ? Make sure you have little or no nausea before eating solid foods. General instructions  Have a responsible adult stay with you until you are awake and alert.  Take  over-the-counter and prescription medicines only as told by your health care provider.  If you smoke, do not smoke without supervision.  Keep all follow-up visits as told by your health care provider. This is important. Contact a health care provider if:  You keep feeling nauseous or you keep vomiting.  You feel light-headed.  You develop a rash.  You have a fever. Get help right away if:  You have trouble breathing. This information is not intended to replace advice given to you by your health care provider. Make sure you discuss any questions you have with your health care provider. Document Released: 03/08/2013 Document Revised: 10/21/2015 Document Reviewed: 09/07/2015 Elsevier Interactive  Patient Education  2018 [place] After This sheet gives you information about how to care for yourself after your procedure. Your health care provider may also give you more specific instructions depending on the type of biopsy you had. If you have problems or questions, contact your health care provider. What can I expect after the procedure? After the procedure, it is common to have:  A cough.  A sore throat.  Pain where a needle, bronchoscope, or incision was used to collect a biopsy sample (biopsy site).  Follow these instructions at home: Medicines  Take over-the-counter and prescription medicines only as told by your health care provider.  Do not drive for 24 hours if you were given a sedative.  Do not drink alcohol while taking pain medicine.  Do not drive or use heavy machinery while taking prescription pain medicine.  To prevent or treat constipation while you are taking prescription pain medicine, your health care provider may recommend that you: ? Drink enough fluid to keep your urine clear or pale yellow. ? Take over-the-counter or prescription medicines. ? Eat foods that are high in fiber, such as fresh fruits and vegetables, whole grains, and  beans. ? Limit foods that are high in fat and processed sugars, such as fried and sweet foods. Activity  If you had an incision during your procedure, avoid activities that may pull the incision site open.  Return to your normal activities as told by your health care provider. Ask your health care provider what activities are safe for you. If you had an open biopsy:   Follow instructions from your health care provider about how to take care of your incision. Make sure you: ? Wash your hands with soap and water before you change your bandage (dressing). If soap and water are not available, use hand sanitizer. ? Change your dressing as told by your health care provider. ? Leave stitches (sutures), skin glue, or adhesive strips in place. These skin closures may need to stay in place for 2 weeks or longer. If adhesive strip edges start to loosen and curl up, you may trim the loose edges. Do not remove adhesive strips completely unless your health care provider tells you to do that.  Check your incision area every day for signs of infection. Check for: ? Redness, swelling, or pain. ? Fluid or blood. ? Warmth. ? Pus or a bad smell. General instructions  It is up to you to get the results of your procedure. Ask your health care provider, or the department that is doing the procedure, when your results will be ready. Contact a health care provider if:  You have a fever.  You have redness, swelling, or pain around your biopsy site.  You have fluid or blood coming from your biopsy site.  Your biopsy site feels warm to the touch.  You have pus or a bad smell coming from your biopsy site. Get help right away if:  You cough up blood.  You have trouble breathing.  You have chest pain. Summary  After the procedure, it is common to have a sore throat and a cough.  Return to your normal activities as told by your health care provider. Ask your health care provider what activities are safe  for you.  Take over-the-counter and prescription medicines only as told by your health care provider.  Report any unusual symptoms to your health care provider. This information is not intended to replace advice given to you  by your health care provider. Make sure you discuss any questions you have with your health care provider. Document Released: 06/16/2016 Document Revised: 06/16/2016 Document Reviewed: 06/16/2016 Elsevier Interactive Patient Education  [employer].

## 2017-04-06 NOTE — Sedation Documentation (Signed)
Patient is resting comfortably. 

## 2017-04-06 NOTE — H&P (Signed)
Chief Complaint: Patient was seen in consultation today for left posterior pleural mass biopsy at the request of Magrinat,Gustav C  Referring Physician(s): Chauncey Cruel  Supervising Physician: Arne Cleveland  Patient Status: [facility] - Out-pt  History of Present Illness: Debbie [name] is a 51 y.o. female   Hx Breast Ca 28 Follows with Dr Jana Hakim Follow up CT scan after complaints of back pain 03/17/17: IMPRESSION: 1. Nodular and masslike pleural thickening throughout the left hemithorax with a small left pleural effusion, consistent with metastatic disease. 2. Enlarging AP window lymph node, worrisome for metastatic disease. 3. Hepatic steatosis.  PET 03/24/17: IMPRESSION: 1. Widespread metastatic disease to the left pleural space. These metastases extend into the fissures. No definite chest wall invasion. 2. Single mildly hypermetabolic small subcarinal lymph node could reflect a metastasis as well. The AP window nodes noted on the recent chest CT are not hypermetabolic. 3. No other evidence of metastatic disease. 4. Stable small left pleural effusion.  Hepatic steatosis.  Now scheduled for left pleural mass biopsy   Past Medical History:  Diagnosis Date  . Abnormal Pap smear   . Anxiety   . Breast cancer ([place]) 2015   ER+/PR+/Her2-  . Diabetes mellitus without complication ([place])   . Hyperlipidemia   . IBS (irritable bowel syndrome)   . IBS (irritable bowel syndrome)   . Menorrhagia   . Migraines    menstral  . Migraines   . MVP (mitral valve prolapse)    echo 2/15-mitral valve normal-no regurg,no stenosis  . Radiation 02/14/14-04/03/14   Left Breast  . Seasonal allergies     Past Surgical History:  Procedure Laterality Date  . ENDOMETRIAL ABLATION    . SVD      x 3  . VULVA /PERINEUM BIOPSY  2011  . WISDOM TEETH REMOVAL      Allergies: Dust mite extract  Medications: Prior to Admission medications   Medication Sig Start Date End Date  Taking? Authorizing Provider  ALPRAZolam Duanne Moron) 0.5 MG tablet Take 20 minutes before MRI; may repeat once 03/22/17  Yes Magrinat, [name], MD  anastrozole (ARIMIDEX) 1 MG tablet Take 1 tablet (1 mg total) by mouth daily. 03/17/17  Yes Magrinat, [name], MD  cetirizine (ZYRTEC) 10 MG tablet Take 10 mg by mouth daily.   Yes [provider]  frovatriptan (FROVA) 2.5 MG tablet Take 1 tablet (2.5 mg total) by mouth as needed for migraine. If recurs, may repeat after 2 hours. Max of 3 tabs in 24 hours. 06/11/15  Yes Magrinat, [name], MD  oxymetazoline (AFRIN) 0.05 % nasal spray Place 2 sprays into the nose daily as needed. congestion   Yes [provider]  PARoxetine (PAXIL) 20 MG tablet Take 20 mg by mouth every morning.   Yes [provider]  Vitamin D, Ergocalciferol, (DRISDOL) 50000 UNITS CAPS capsule Take 50,000 Units by mouth every 7 (seven) days.  06/13/13  Yes [provider]  mometasone (NASONEX) 50 MCG/ACT nasal spray Place 2 sprays into the nose daily as needed (for allergies).     [provider]  omeprazole (PRILOSEC) 20 MG capsule Take 1 capsule (20 mg total) by mouth 2 (two) times daily before a meal. Patient taking differently: Take 20 mg daily as needed by mouth.  08/24/13   Amada Kingfisher, MD     Family History  Problem Relation Age of Onset  . Cancer Maternal Grandmother        BREAST AND UTERINE CANCER  .  Uterine cancer Maternal Grandmother 84  . Hyperlipidemia Mother   . Hypertension Mother   . Multiple myeloma Father 94  . Lung cancer Maternal Manufacturing engineer  . Testicular cancer Other 18    Social History   Socioeconomic History  . Marital status: Married    Spouse name: None  . Number of children: 3  . Years of education: None  . Highest education level: None  Social Needs  . Financial resource strain: None  . Food insecurity - worry: None  . Food insecurity - inability: None  . Transportation needs -  medical: None  . Transportation needs - non-medical: None  Occupational History    Employer: [employer]   Tobacco Use  . Smoking status: Never Smoker  . Smokeless tobacco: Never Used  Substance and Sexual Activity  . Alcohol use: No  . Drug use: No  . Sexual activity: Yes    Partners: Male    Birth control/protection: Other-see comments    Comment: SPOUSE HAD A VASEC.  Other Topics Concern  . None  Social History Narrative  . None    Review of Systems: A 12 point ROS discussed and pertinent positives are indicated in the HPI above.  All other systems are negative.  Review of Systems  Constitutional: Negative for activity change, appetite change, fatigue and fever.  Respiratory: Negative for shortness of breath.   Gastrointestinal: Negative for abdominal pain.  Musculoskeletal: Positive for back pain.  Neurological: Negative for weakness.  Psychiatric/Behavioral: Negative for behavioral problems and confusion.    Vital Signs: BP 132/85 (BP Location: Right Arm)   Pulse 85   Temp 97.8 F (36.6 C) (Oral)   Ht 5' 4.5" (1.638 m)   Wt 202 lb (91.6 kg)   SpO2 99%   BMI 34.14 kg/m   Physical Exam  Constitutional: She is oriented to person, place, and time.  Cardiovascular: Normal rate, regular rhythm and normal heart sounds.  Pulmonary/Chest: Effort normal and breath sounds normal.  Abdominal: Soft. Bowel sounds are normal.  Musculoskeletal: Normal range of motion.  Neurological: She is alert and oriented to person, place, and time.  Skin: Skin is warm and dry.  Psychiatric: She has a normal mood and affect. Her behavior is normal. Judgment and thought content normal.  Nursing note and vitals reviewed.   Imaging: Dg Thoracic Spine 2 View  Result Date: 03/09/2017 CLINICAL DATA:  Thoracic spine pain without known injury. EXAM: THORACIC SPINE 2 VIEWS COMPARISON:  Radiographs of August 10, 2013. FINDINGS: There is no evidence of thoracic spine fracture.  Alignment is normal. No other significant bone abnormalities are identified. IMPRESSION: Normal thoracic spine. Electronically Signed   By: Marijo Conception, M.D.   On: 03/09/2017 15:55   Ct Chest W Contrast  Result Date: 03/17/2017 CLINICAL DATA:  Upper back pain for 2 months.  Left breast cancer. EXAM: CT CHEST WITH CONTRAST TECHNIQUE: Multidetector CT imaging of the chest was performed during intravenous contrast administration. CONTRAST:  25 cc Isovue-300. COMPARISON:  07/15/2013. FINDINGS: Cardiovascular: Vascular structures are unremarkable. Coronary artery calcification. Heart is mildly enlarged. No pericardial effusion. Mediastinum/Nodes: AP window lymph node measures 9 mm, previously 5 mm. Bi hilar lymph nodes are not enlarged by CT size criteria. Surgical clips in the left axilla. No axillary adenopathy. Esophagus is grossly unremarkable. Lungs/Pleura: Extensive nodularity and masslike pleural thickening throughout the left hemithorax, new. Small left pleural effusion, new. Lungs are otherwise clear. Airway  is unremarkable. Upper Abdomen: Visualized portion of the liver appears decreased in attenuation diffusely. Visualized portions of the adrenal glands, kidneys, spleen, pancreas, stomach and bowel are grossly unremarkable. Musculoskeletal: Degenerative changes in the spine. No worrisome lytic or sclerotic lesions. Postoperative and post treatment changes in the left breast. IMPRESSION: 1. Nodular and masslike pleural thickening throughout the left hemithorax with a small left pleural effusion, consistent with metastatic disease. 2. Enlarging AP window lymph node, worrisome for metastatic disease. 3. Hepatic steatosis. Electronically Signed   By: Lorin Picket M.D.   On: 03/17/2017 15:30   Nm Pet Image Initial (pi) Skull Base To Thigh  Result Date: 03/24/2017 CLINICAL DATA:  Subsequent Treatment strategy for left breast cancer, initially diagnosed 3 years ago. New pleural thickening in the left  hemithorax on CT. EXAM: NUCLEAR MEDICINE PET SKULL BASE TO THIGH TECHNIQUE: 12.63 mCi F-18 FDG was injected intravenously. Full-ring PET imaging was performed from the skull base to thigh after the radiotracer. CT data was obtained and used for attenuation correction and anatomic localization. FASTING BLOOD GLUCOSE:  Value: 118 mg/dl COMPARISON:  Chest CT 03/17/2017 and 07/15/2013.  PET-CT 07/15/2013. FINDINGS: NECK No hypermetabolic cervical lymph nodes are identified.There are no lesions of the pharyngeal mucosal space. CHEST There is mild hypermetabolic activity within the subcarinal region which may correspond with a small lymph node measuring 8 mm short axis on image 76. This has an SUV max of 4.4. No other hypermetabolic mediastinal, hilar, internal mammary or axillary lymph nodes are present. Small AP window lymph nodes are not hypermetabolic. However, the multiple pleural-based masses in the left hemithorax seen on the recent chest CT are hypermetabolic. The largest component posterior to the upper lobe measures up to 5.1 x 2.4 cm on image 66 and has an SUV max of 16.4. Apical component measuring up to 3.2 cm on image 57 has an SUV max of 13.2. A small left pleural effusion is unchanged in volume. There is no hypermetabolic activity in the right pleural space or in the lungs. Left pleural nodularity extends into the fissures. ABDOMEN/PELVIS There is no hypermetabolic activity within the liver, adrenal glands, spleen or pancreas. There is no hypermetabolic nodal activity. Diffuse hepatic steatosis noted. SKELETON There is no hypermetabolic activity to suggest osseous metastatic disease. No evidence of chest wall recurrence. IMPRESSION: 1. Widespread metastatic disease to the left pleural space. These metastases extend into the fissures. No definite chest wall invasion. 2. Single mildly hypermetabolic small subcarinal lymph node could reflect a metastasis as well. The AP window nodes noted on the recent chest  CT are not hypermetabolic. 3. No other evidence of metastatic disease. 4. Stable small left pleural effusion.  Hepatic steatosis. Electronically Signed   By: Richardean Sale M.D.   On: 03/24/2017 13:28    Labs:  CBC: Recent Labs    03/17/17 1245 03/30/17 1256 04/06/17 0953  WBC 9.4 9.2 6.0  HGB 12.9 14.0 12.7  HCT 39.0 41.8 39.2  PLT 298 302 280    COAGS: No results for input(s): INR, APTT in the last 8760 hours.  BMP: Recent Labs    03/17/17 1245 03/30/17 1256  NA 138 140  K 4.2 4.2  CO2 22 21*  GLUCOSE 102 98  BUN 12.8 14.1  CALCIUM 9.3 9.8  CREATININE 0.7 0.7    LIVER FUNCTION TESTS: Recent Labs    03/17/17 1245 03/30/17 1256  BILITOT 0.37 0.40  AST 30 28  ALT 39 38  ALKPHOS 69 65  PROT  7.1 7.5  ALBUMIN 3.8 4.2    TUMOR MARKERS: No results for input(s): AFPTM, CEA, CA199, CHROMGRNA in the last 8760 hours.  Assessment and Plan:  Hx Breast Ca New left pleural mss ; +PET Scheduled now for biopsy and Breast prognostic panel Risks and benefits discussed with the patient including, but not limited to bleeding, hemoptysis, respiratory failure requiring intubation, infection, pneumothorax requiring chest tube placement, stroke from air embolism or even death. All of the patient's questions were answered, patient is agreeable to proceed. Consent signed and in chart.   Thank you for this interesting consult.  I greatly enjoyed meeting Debbie [name] and look forward to participating in their care.  A copy of this report was sent to the requesting provider on this date.  Electronically Signed: Lavonia Drafts, PA-C 04/06/2017, 10:46 AM   I spent a total of  30 Minutes   in face to face in clinical consultation, greater than 50% of which was counseling/coordinating care for left pleural mass biopsy

## 2017-04-06 NOTE — Procedures (Signed)
  Procedure: CT L pleural mass core bx 18g x4 to surg path Preprocedure diagnosis: Pleural mets Postprocedure diagnosis: same EBL:   minimal Complications:  none immediate  See full dictation in [facility].  Dillard Cannon MD Main # [phone] Pager  [phone]

## 2017-04-07 ENCOUNTER — Ambulatory Visit: Payer: BLUE CROSS/BLUE SHIELD | Admitting: Oncology

## 2017-04-09 NOTE — Progress Notes (Signed)
[facility]  Telephone:(336) [phone] Fax:(336) [phone]    ID: Debbie Debbie [name] OB: 10-Mar-1966  MR#: [id number]  FAO#:[id number]  Patient Care Team: Debbie Stains, MD as PCP - General (Family Medicine) Debbie Debbie [name], Debbie Dad, MD as Consulting Physician (Oncology) Debbie Pigg, MD as Referring Physician (Surgery) Debbie Bern, MD as Consulting Physician (Obstetrics and Gynecology) Debbie Klein, MD as Consulting Physician (General Surgery) Debbie Pigg, MD as Referring Physician (Surgery) Debbie Fuerst, MD as Consulting Physician (Radiation Oncology) Debbie Debbie [name], Debbie Grinder, MD as Consulting Physician (Allergy and Immunology) Debbie Silence, MD as Consulting Physician (Gastroenterology) Debbie Dash, MD as Referring Physician (Internal Medicine)    CHIEF COMPLAINT: locally advanced estrogen receptor positive breast cancer  CURRENT TREATMENT:  Anastrozole  BREAST CANCER HISTORY: As per previously documented note:  Debbie Debbie [name] had routine yearly screening mammography at the breast center 06/19/2013 showing a possible mass in the left breast. The right breast was unremarkable. On 06/30/2013 additional views of the left breast showed an irregular spiculated dense mass measuring 2.2 cm in the lower inner quadrant. This was palpable at the 8:00 location. Ultrasound showed an irregular hypoechoic mass measuring 2.3 cm and in the left axilla a dominant abnormal appearing lymph node with cortical thickening measuring 1.2 cm.  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  [id number] the patient underwent bilateral breast MRI.  This showed her breasts to be dense (composition C.). In the left breast at the 8:00 position there was a 2.4 cm enhancing mass containing a biopsy clip. There were no additional areas of concern. In the axilla on the left there was a 2.6 cm enlarged left axillary lymph node. There was also a 0.5 cm left internal mammary lymph nodes noted. The right side was unremarkable.  The patient's subsequent history is as detailed below.  INTERVAL HISTORY: Debbie Debbie [name] returns today for follow-up and treatment of her estrogen receptor positive breast cancer. She is accompanied by her Debbie [name], Debbie Debbie [name]. She continues on anastrozole, which she is tolerating well. She denies hot flashes and vaginal dryness at this time. She is able to obtain at a reasonable price.  REVIEW OF SYSTEMS: Debbie Debbie [name] is doing well. She reports walking for exercise. She denies unusual headaches, visual changes, nausea, vomiting, or dizziness. There has been no unusual cough, phlegm production, or pleurisy. This been no change in bowel or bladder habits. She denies unexplained fatigue or unexplained weight loss, bleeding, rash, or fever. A detailed review of systems was otherwise entirely stable.   PAST MEDICAL HISTORY: Past Medical History:  Diagnosis Date  . Abnormal Pap smear   . Anxiety   . Breast cancer ([facility] [facility]) 2015   ER+/PR+/Her2-  . Diabetes mellitus without complication ([place])   . Hyperlipidemia   . IBS (irritable bowel syndrome)   . IBS (irritable bowel syndrome)   . Menorrhagia   . Migraines    menstral  . Migraines   . MVP (mitral valve prolapse)    echo 2/15-mitral valve normal-no regurg,no stenosis  . Radiation 02/14/14-04/03/14   Left Breast  . Seasonal allergies     PAST SURGICAL HISTORY: Past Surgical History:  Procedure Laterality Date  . ENDOMETRIAL  ABLATION    . SVD      x 3  . VULVA /PERINEUM BIOPSY  2011  . WISDOM TEETH REMOVAL      FAMILY HISTORY Family History  Problem Relation Age of Onset  . Cancer  Maternal Grandmother        BREAST AND UTERINE CANCER  . Uterine cancer Maternal Grandmother 84  . Hyperlipidemia Mother   . Hypertension Mother   . Multiple myeloma Father 39  . Lung cancer Maternal Manufacturing engineer  . Testicular cancer Other 14  The patient's father is living, currently age 17. He has a history of multiple myeloma. The patient's mother is 5.  She was diagnosed with ductal carcinoma in situ in 2016.The patient's parents live in [place] The patient had no brothers, 2 sisters. There is no history of breast or ovarian cancer in the family. She believes one of her grandfathers died from lung cancer and another from pancreatic cancer, but is not sure  GYNECOLOGIC HISTORY:  Menarche age 77, first live birth age 42, which the patient understands is an independent risk factor for breast cancer. She is GX P3. She was still having regular periods at the start of chemotherapy. LMP was March 2015. She tells me Dr. Cletis [name] checked her labs December 2015 and found that she was in the menopausal range. 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 Her Debbie [name], Debbie Debbie [name], is a Energy manager at [employer]. Her 3 children are Debbie [name] (16), Debbie [name] (12) and Debbie [name] (9). The patient attends a local [place].    ADVANCED DIRECTIVES: Not in place   HEALTH MAINTENANCE: Social History   Tobacco Use  . Smoking status: Never Smoker  . Smokeless tobacco: Never Used  Substance Use Topics  . Alcohol use: No  . Drug use: No     Colonoscopy: January 2015  OMV:[facility]MCNOBS 2014  Bone density:  Lipid panel:  Allergies  Allergen Reactions  . Dust Mite Extract Itching    Current Outpatient Medications  Medication Sig Dispense Refill  . ALPRAZolam (XANAX) 0.5 MG tablet Take 20 minutes before MRI; may repeat once 5 tablet 0  . anastrozole (ARIMIDEX) 1 MG tablet  Take 1 tablet (1 mg total) by mouth daily. 90 tablet 4  . cetirizine (ZYRTEC) 10 MG tablet Take 10 mg by mouth daily.    . Cholecalciferol (VITAMIN D3) 2000 units TABS Take 4 (four) times daily by mouth.    . frovatriptan (FROVA) 2.5 MG tablet Take 1 tablet (2.5 mg total) by mouth as needed for migraine. If recurs, may repeat after 2 hours. Max of 3 tabs in 24 hours. 10 tablet 0  . mometasone (NASONEX) 50 MCG/ACT nasal spray Place 2 sprays into the nose daily as needed (for allergies).     Marland Kitchen omeprazole (PRILOSEC) 20 MG capsule Take 1 capsule (20 mg total) by mouth 2 (two) times daily before a meal. (Patient taking differently: Take 20 mg daily as needed by mouth. ) 60 capsule 3  . oxymetazoline (AFRIN) 0.05 % nasal spray Place 2 sprays into the nose daily as needed. congestion    . PARoxetine (PAXIL) 20 MG tablet Take 20 mg by mouth every morning.     No current facility-administered medications for this visit.     OBJECTIVE: Middle-aged white woman in no acute distress Vitals:   04/12/17  0844  BP: 128/74  Pulse: 80  Resp: 20  Temp: 98.5 F (36.9 C)  SpO2: 97%     Body mass index is 33.92 kg/m.    ECOG FS:1 - Symptomatic but completely ambulatory  Sclerae unicteric, pupils round and equal Oropharynx clear and moist No cervical or supraclavicular adenopathy Lungs no rales or rhonchi Heart regular rate and rhythm Abd soft, nontender, positive bowel sounds MSK no focal spinal tenderness, no upper extremity lymphedema Neuro: nonfocal, well oriented, appropriate affect Breasts: Deferred       Component Value Date/Time   NA 140 03/30/2017 1256   K 4.2 03/30/2017 1256   CO2 21 (L) 03/30/2017 1256   GLUCOSE 98 03/30/2017 1256   BUN 14.1 03/30/2017 1256   CREATININE 0.7 03/30/2017 1256   CALCIUM 9.8 03/30/2017 1256   PROT 7.5 03/30/2017 1256   ALBUMIN 4.2 03/30/2017 1256   AST 28 03/30/2017 1256   ALT 38 03/30/2017 1256   ALKPHOS 65 03/30/2017 1256   BILITOT 0.40 03/30/2017  1256    I No results found for: SPEP  Lab Results  Component Value Date   WBC 6.0 04/06/2017   NEUTROABS 6.0 03/30/2017   HGB 12.7 04/06/2017   HCT 39.2 04/06/2017   MCV 82.9 04/06/2017   PLT 280 04/06/2017      Chemistry      Component Value Date/Time   NA 140 03/30/2017 1256   K 4.2 03/30/2017 1256   CO2 21 (L) 03/30/2017 1256   BUN 14.1 03/30/2017 1256   CREATININE 0.7 03/30/2017 1256      Component Value Date/Time   CALCIUM 9.8 03/30/2017 1256   ALKPHOS 65 03/30/2017 1256   AST 28 03/30/2017 1256   ALT 38 03/30/2017 1256   BILITOT 0.40 03/30/2017 1256       No results found for: LABCA2  No components found for: LABCA125  Recent Labs  Lab 04/06/17 0953  INR 1.01    Urinalysis    Component Value Date/Time   BILIRUBINUR Negative 09/22/2011 1451   PROTEINUR Trace 09/22/2011 1451   UROBILINOGEN negative 09/22/2011 1451   NITRITE Negative 09/22/2011 1451   LEUKOCYTESUR Trace 09/22/2011 1451    STUDIES: CT Biopsy on 04/06/2017 of Left Pleural Mass was successful.  PET scan, 03/24/2017, shows widespread metastatic disease to the left pleural space, extending into the fissures.    ASSESSMENT: 51 y.o. [place] woman status post left breast lower inner quadrant and left axillary lymph node biopsy 06/30/2013 for a clinical T2 N1-2, stage IIB/IIIA invasive ductal carcinoma, grade 2, estrogen and progesterone receptors both strongly positive, HER-2 nonamplified, with an MIB-1 of 36%.  (1) there is a 5 mm left internal mammary lymph node which was negative on PET scan  (2) cyclophosphamide and doxorubicin started 08/04/2014, given in dose dense fashion x4 with Neulasta day 2; completed 09/14/2013, followed by paclitaxel again given in dose dense fashion x4, completed 11/09/2013  (3) left lumpectomy and axillary lymph node dissection 12/13/2013 at [facility] showed a residual pT2 pN1 (mic), stage IIB invasive ductal carcinoma; margins positive for DCIS cleared with  subsequent surgery (01/03/2014)  (4) adjuvant radiation therapy completed 04/03/2014  (5) tamoxifen started December 2015, discontinued October 2018  (6) hepatic steatosis: documented on abdominal US 08/07/2014, with elevated but stable LFTs,  (7) genetic testing 07/24/2013 through the  "Women's Hereditary Panel", performed at [facility], found no deleterious mutations in  ATM, BRCA1, BRCA2, BRIP1, CDH1, CHEK2 (c.1100delC only), EPCAM, MLH1, MSH2, MSH6, NBN, PALB2,  PMS2, PTEN, RAD51C, STK11, and TP53.  METASTATIC DISEASE: October 2018: Involving left lung, but not liver or bones.  Biopsy confirms strongly estrogen and progesterone receptor positive breast cancer, HER-2 nonamplified  (8) anastrozole started October 2018  (b) palbociclib added April 14, 2017  PLAN: I spent approximately 40 minutes with Debbie Debbie [name] with most of that time spent discussing her complex problems.  She understands that she has metastatic disease, which is not curable, but very treatable.  She understands that the goal of treatment is control.  She has already started anastrozole and is tolerating that well.  We are going to add palbociclib at 125 mg daily, 21 days on and 7 days off.  We discussed the possible toxicities, side effects and complications of that agent.  We will try to get that started by April 14, 2017.  She is interested perhaps in obtaining second opinions, perhaps at [facility], perhaps at [facility].  I have asked her to let us know where she would like to go and we will be glad to facilitate that.  She understands I will be glad to continue to be her physician in any capacity that she would like.  We are going to check lab work in 2 weeks.  She is going to return to see me in 4 weeks.  That will be the start of her second cycle of palbociclib.  When she completes 3 cycles we will repeat a PET scan.  I have suggested she also meet with our financial advisor's.  She  knows to call for any other issues that may develop before the next visit.  Chauncey Cruel, MD Medical Oncology and Hematology [facility] [address] [place], [place] [address] Tel. [phone]    Fax. [place], MD 04/12/17  9:00 AM Medical Oncology and Hematology [facility] [address] [place], [place] [address] Tel. [phone] Fax. [phone]  This document serves as a record of services personally performed by Chauncey Cruel, MD. It was created on his behalf by Margit Banda, a trained medical scribe. The creation of this record is based on the scribe's personal observations and the provider's statements to them.   I have reviewed the above documentation for accuracy and completeness, and I agree with the above.

## 2017-04-12 ENCOUNTER — Ambulatory Visit (HOSPITAL_BASED_OUTPATIENT_CLINIC_OR_DEPARTMENT_OTHER): Payer: BLUE CROSS/BLUE SHIELD | Admitting: Oncology

## 2017-04-12 ENCOUNTER — Telehealth: Payer: Self-pay | Admitting: Pharmacy Technician

## 2017-04-12 ENCOUNTER — Telehealth: Payer: Self-pay | Admitting: Pharmacist

## 2017-04-12 ENCOUNTER — Telehealth: Payer: Self-pay | Admitting: Oncology

## 2017-04-12 VITALS — BP 128/74 | HR 80 | Temp 98.5°F | Resp 20 | Ht 64.5 in | Wt 200.7 lb

## 2017-04-12 DIAGNOSIS — C50312 Malignant neoplasm of lower-inner quadrant of left female breast: Secondary | ICD-10-CM

## 2017-04-12 DIAGNOSIS — Z170 Estrogen receptor positive status [ER+]: Secondary | ICD-10-CM

## 2017-04-12 DIAGNOSIS — C773 Secondary and unspecified malignant neoplasm of axilla and upper limb lymph nodes: Secondary | ICD-10-CM | POA: Diagnosis not present

## 2017-04-12 DIAGNOSIS — C7801 Secondary malignant neoplasm of right lung: Secondary | ICD-10-CM

## 2017-04-12 DIAGNOSIS — C7800 Secondary malignant neoplasm of unspecified lung: Secondary | ICD-10-CM

## 2017-04-12 MED ORDER — PALBOCICLIB 125 MG PO CAPS: ORAL_CAPSULE | ORAL | 6 refills | Status: DC

## 2017-04-12 MED ORDER — PALBOCICLIB 125 MG PO CAPS: 125.0000 mg | ORAL_CAPSULE | Freq: Every day | ORAL | 6 refills | Status: DC

## 2017-04-12 MED FILL — IBRANCE 125 MG CAPSULE: 125 | 21 days supply | Qty: 21 | Fill #0

## 2017-04-12 NOTE — Telephone Encounter (Signed)
Gave patient avs report and appointments for November and December  °

## 2017-04-12 NOTE — Telephone Encounter (Signed)
Oral Oncology Pharmacist Encounter  Received new prescription for Ibrance (palbociclib) for the treatment of hormone-receptor positive, metastatic breast cancer in conjunction with anastrazole, planned duration until disease progression or unacceptable toxicity.  Labs from 10/30 and 11/6 assessed, OK for treatment.  Current medication list in Epic reviewed, no significant DDIs with Ibrance identified.  Prescription has been e-scribed to the [facility] for benefits analysis and approval. Prior authorization has been submitted and is pending. 1st fill on manufacturer free voucher to ensure timely start of Ibrance. MD has indicated planned start date of 04/14/17.  Lab check scheduled for 04/28/17 (C1D14) MD follow-up scheduled for 05/11/17, patient needs lab appt for this date as well, not currently scheduled (it was requested in 04/12/17 LOS).   I spoke with patient for overview of: Ibrance.   Pt is doing well. Counseled patient on administration, dosing, side effects, monitoring, drug-food interactions, safe handling, storage, and disposal.  Patient will take Ibrance 125mg  capsules, 1 capsule by mouth once daily with lunch (she does not normally eat breakfast), for 3 weeks on, 1 week off. Patient knows to avoid grapefruit and grapefruit juice. Ibrance start date: 04/14/17  Side effects include but not limited to: fatigue, hair loss, GI upset, nausea, decreased blood counts, and increased upper respiratory infections. Patient expressed concern about risk of diarrhea due to underlying IBS. Patient instructed to let the office know if she has any change in bowel habits after starting Ibrance. She has loperamide at home for symptoms of diarrhea if needed.  Reviewed with patient importance of keeping a medication schedule and plan for any missed doses.  Ms. Hauk voiced understanding and appreciation.   All questions answered. Medication reconciliation performed and  medication/allergy list updated.  Leslee Home will be shipped from the pharmacy for delivery to patient's home tomorrow 04/13/17.  Will follow up with patient regarding insurance and pharmacy for subsequent cycles.   Patient knows to call the office with questions or concerns. Oral Oncology Clinic will continue to follow.  Thank you,  Johny Drilling, PharmD, BCPS, BCOP 04/12/2017   12:23 PM Oral Oncology Clinic [phone]

## 2017-04-12 NOTE — Telephone Encounter (Signed)
Oral Oncology Patient Advocate Encounter  Received notification from [facility] that prior authorization for Debbie [name] is required.  PA submitted on CoverMyMeds Key [id number] Status is pending  Oral Oncology Clinic will continue to follow.  [name] [name], [place] Patient [place] [phone] 04/12/2017 11:20 AM

## 2017-04-13 ENCOUNTER — Telehealth: Payer: Self-pay | Admitting: *Deleted

## 2017-04-13 NOTE — Progress Notes (Signed)
  Oncology Nurse Navigator Documentation  Navigator Location: [facility] (04/13/17 1200)   ) Patient called to ask if it would be OK to get the flu vaccine since she is preparing to start Ibrance.  I discussed with Dr. [name] nurse and informed patient that it would be OK for her to get the flu vaccine.  Patient reports that she is doing OK and denied any other needs at this time.                       Treatment Phase: Treatment (04/13/17 1200) Barriers/Navigation Needs: Education (04/13/17 1200) Education: Other(Question re: Flu vaccine) (04/13/17 1200)                        Time Spent with Patient: 15 (04/13/17 1200)

## 2017-04-15 MED ORDER — PALBOCICLIB 125 MG PO CAPS: ORAL_CAPSULE | ORAL | 6 refills | Status: DC

## 2017-04-15 NOTE — Telephone Encounter (Signed)
Oral Oncology Patient Advocate Encounter  Prior Authorization for Debbie [name] has been approved.    PA# [id number] Effective dates: 03/30/2017 through 04/14/2020  Debbie [name] is aware of the approval.    [name] [name], [name] Patient [place] [phone] 04/15/2017 10:45 AM

## 2017-04-15 NOTE — Telephone Encounter (Signed)
Oral Oncology Pharmacist Encounter  Ibrance prescription sent to [facility] per insurance requirement. Patient already has 1st fill of Ibrance from local pharmacy on [address].  Oral Oncology Patient Advocate will update patient about prescription processing.  Oral Oncology Clinic will continue to follow.  Johny Drilling, PharmD, BCPS, BCOP 04/15/2017 4:20 PM Oral Oncology Clinic [phone]

## 2017-04-16 ENCOUNTER — Telehealth: Payer: Self-pay

## 2017-04-16 ENCOUNTER — Other Ambulatory Visit: Payer: Self-pay

## 2017-04-16 DIAGNOSIS — C7800 Secondary malignant neoplasm of unspecified lung: Secondary | ICD-10-CM

## 2017-04-16 NOTE — Telephone Encounter (Signed)
Oral Oncology Patient Advocate Encounter  Spoke with Debbie [name] about the PA approval and prescription processing of her Debbie [name].  I informed her that her prescription has been sent to [place] [facility] and provided her with the phone number [phone].    She will contact the clinic if she has any additional concerns or questions.   [name] [name], [name] Patient [place] [phone] 04/16/2017 9:36 AM

## 2017-04-16 NOTE — Telephone Encounter (Signed)
Spoke with pt by phone.  She is wanting a second opinion d/t recent diagnosis of metastatic disease.  She has discussed this with Dr Jana Hakim.  Pt request second opinion from [facility] [place] and [facility].    This RN called [facility] new pt referral number [phone]) to start referral process.  Face sheet / demographics, most recent office note, pathology report, most recent labs and scans faxed to [phone].  This RN called pt to let her know to expect a call from someone from [facility] by the end of the day today to set up appt.  Pt also given direct phone number to [facility] [phone])  Pt's information has also been given to new pt referral team at [facility] [phone]).  This RN currently awaiting a return call from [facility] center to continue process for referral.  Their fax number is [phone]

## 2017-04-16 NOTE — Telephone Encounter (Signed)
Added a early lab appointment. Called patient to confirm. Per 11/12 sch message

## 2017-04-21 ENCOUNTER — Other Ambulatory Visit: Payer: Self-pay | Admitting: *Deleted

## 2017-04-21 MED ORDER — ANASTROZOLE 1 MG PO TABS: 1.0000 mg | ORAL_TABLET | Freq: Every day | ORAL | 4 refills | Status: DC

## 2017-04-28 ENCOUNTER — Other Ambulatory Visit (HOSPITAL_BASED_OUTPATIENT_CLINIC_OR_DEPARTMENT_OTHER): Payer: BLUE CROSS/BLUE SHIELD

## 2017-04-28 DIAGNOSIS — Z170 Estrogen receptor positive status [ER+]: Secondary | ICD-10-CM

## 2017-04-28 DIAGNOSIS — C773 Secondary and unspecified malignant neoplasm of axilla and upper limb lymph nodes: Secondary | ICD-10-CM

## 2017-04-28 DIAGNOSIS — C7801 Secondary malignant neoplasm of right lung: Secondary | ICD-10-CM | POA: Diagnosis not present

## 2017-04-28 DIAGNOSIS — C50312 Malignant neoplasm of lower-inner quadrant of left female breast: Secondary | ICD-10-CM

## 2017-04-28 DIAGNOSIS — C7800 Secondary malignant neoplasm of unspecified lung: Secondary | ICD-10-CM

## 2017-04-28 LAB — COMPREHENSIVE METABOLIC PANEL
ALT: 37 U/L (ref 0–55)
AST: 23 U/L (ref 5–34)
Albumin: 4.1 g/dL (ref 3.5–5.0)
Alkaline Phosphatase: 94 U/L (ref 40–150)
Anion Gap: 11 mEq/L (ref 3–11)
BUN: 12.7 mg/dL (ref 7.0–26.0)
CO2: 20 mEq/L — ABNORMAL LOW (ref 22–29)
Calcium: 9 mg/dL (ref 8.4–10.4)
Chloride: 108 mEq/L (ref 98–109)
Creatinine: 0.8 mg/dL (ref 0.6–1.1)
EGFR: >60 mL/min/{1.73_m2} (ref >60)
Glucose: 152 mg/dl — ABNORMAL HIGH (ref 70–140)
Potassium: 4.1 mEq/L (ref 3.5–5.1)
Sodium: 139 mEq/L (ref 136–145)
Total Bilirubin: 0.42 mg/dL (ref 0.20–1.20)
Total Protein: 7.3 g/dL (ref 6.4–8.3)

## 2017-04-28 LAB — CBC WITH DIFFERENTIAL/PLATELET
BASO%: 0.3 % (ref 0.0–2.0)
Basophils Absolute: 0 10*3/uL (ref 0.0–0.1)
EOS%: 4.2 % (ref 0.0–7.0)
Eosinophils Absolute: 0.1 10*3/uL (ref 0.0–0.5)
HCT: 37.8 % (ref 34.8–46.6)
HGB: 12.7 g/dL (ref 11.6–15.9)
LYMPH%: 41 % (ref 14.0–49.7)
MCH: 28.3 pg (ref 25.1–34.0)
MCHC: 33.6 g/dL (ref 31.5–36.0)
MCV: 84.4 fL (ref 79.5–101.0)
MONO#: 0.2 10*3/uL (ref 0.1–0.9)
MONO%: 5.6 % (ref 0.0–14.0)
NEUT#: 1.4 10*3/uL — ABNORMAL LOW (ref 1.5–6.5)
NEUT%: 48.9 % (ref 38.4–76.8)
Platelets: 246 10*3/uL (ref 145–400)
RBC: 4.48 10*6/uL (ref 3.70–5.45)
RDW: 13.6 % (ref 11.2–14.5)
WBC: 2.9 10*3/uL — ABNORMAL LOW (ref 3.9–10.3)
lymph#: 1.2 10*3/uL (ref 0.9–3.3)

## 2017-04-29 LAB — CANCER ANTIGEN 27.29: CA 27.29: 86.9 U/mL — ABNORMAL HIGH (ref 0.0–38.6)

## 2017-05-11 ENCOUNTER — Ambulatory Visit: Payer: BLUE CROSS/BLUE SHIELD | Admitting: Oncology

## 2017-05-11 ENCOUNTER — Other Ambulatory Visit: Payer: BLUE CROSS/BLUE SHIELD

## 2017-05-13 NOTE — Progress Notes (Signed)
Debbie [name]  [phone] [phone]     ID: Debbie [name] OB: 04-06-1966  MR#: [id number]  AVW#:[id number]  PCP: Debbie [name] GYN:  Debbie [name] SU: Debbie [name]; Debbie [name] OTHER [name]: Debbie [name], Debbie [name]  CHIEF COMPLAINT: locally advanced estrogen receptor positive breast cancer  CURRENT TREATMENT:  Anastrozole, palbociclib  INTERVAL HISTORY: Debbie [name] returns today for follow-up and treatment of her estrogen receptor positive metastatic breast cancer.  She continues on anastrozole, with good tolerance. She denies hot flashes. She has noticed a difference in vaginal dryness. She notes that she uses a lubricant to aid this.  She has completed 1 cycle of fulvestrant and she begins the 2nd cycle on 05/15/2017. She is tolerating this well so far. She reports that she is more fatigued and notices this more after dinner. She reports that she has a dry mouth.   PET scan on 03/24/2017 with results that showed: 1. Widespread metastatic disease to the left pleural space. These metastases extend into the fissures. No definite chest wall invasion. 2. Single mildly hypermetabolic small subcarinal lymph node could reflect a metastasis as well. The AP window nodes noted on the recent chest CT are not hypermetabolic. 3. No other evidence of metastatic disease. 4. Stable small left pleural effusion. Hepatic steatosis.  She also underwent biopsy of a left pleural mass ([id number]) on 04/06/2017 with results showing: Metastatic carcinoma. Estrogen receptor: 95% positive strong staining intensity. Progesterone receptor: 90% positive strong staining intensity. HER-2 negative  REVIEW OF SYSTEMS: Debbie [name] reports that she is doing well overall and that sh is tolerating her treatments well. She reports that for exercise, she walks serveral times per week for 20-30 minutes. [facility] She denies  unusual headaches, visual changes, nausea, vomiting, or dizziness. There has been no unusual cough, phlegm production, or pleurisy. This been no change in bowel or bladder habits. She denies unexplained fatigue or unexplained weight loss, bleeding, rash, or fever. A detailed review of systems was otherwise stable.   BREAST CANCER HISTORY: As per previously documented note:  Debbie [name] had routine yearly screening mammography at the breast center 06/19/2013 showing a possible mass in the left breast. The right breast was unremarkable. On 06/30/2013 additional views of the left breast showed an irregular spiculated dense mass measuring 2.2 cm in the lower inner quadrant. This was palpable at the 8:00 location. Ultrasound showed an irregular hypoechoic mass measuring 2.3 cm and in the left axilla a dominant abnormal appearing lymph node with cortical thickening measuring 1.2 cm.  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  [id number] the patient underwent bilateral breast MRI. This showed her breasts to be dense (composition C.). In the left breast at the 8:00 position there was a 2.4 cm enhancing mass containing a biopsy clip. There were no additional areas of concern. In the axilla on the left there was a 2.6 cm enlarged left axillary lymph node. There was also a 0.5 cm left internal mammary lymph nodes noted. The right side was unremarkable.  The patient's subsequent history is as detailed below.  PAST MEDICAL HISTORY: Past Medical History:  Diagnosis Date  . Abnormal Pap smear   . Anxiety   . Breast cancer (Debbie [name]) 2015   ER+/PR+/Her2-  . Diabetes  mellitus without complication (Debbie [name])   . Hyperlipidemia   . IBS (irritable bowel syndrome)   . IBS (irritable bowel syndrome)   . Menorrhagia   . Migraines    menstral  . Migraines   . MVP (mitral valve prolapse)    echo 2/15-mitral valve normal-no regurg,no stenosis  . Radiation 02/14/14-04/03/14   Left Breast  . Seasonal allergies     PAST SURGICAL HISTORY: Past Surgical History:  Procedure Laterality Date  . ENDOMETRIAL ABLATION    . PORT-A-CATH REMOVAL N/A 05/15/2014   Procedure: REMOVAL PORT-A-CATH;  Surgeon: Debbie Klein, [name];  Location: [facility];  Service: General;  Laterality: N/A;  . PORTACATH PLACEMENT Left 07/18/2013   Procedure: INSERTION PORT-A-CATH;  Surgeon: Debbie Klein, [name];  Location: [facility];  Service: General;  Laterality: Left;  . SVD      x 3  . VULVA /PERINEUM BIOPSY  2011  . WISDOM TEETH REMOVAL      FAMILY HISTORY Family History  Problem Relation Age of Onset  . Cancer Maternal Grandmother        BREAST AND UTERINE CANCER  . Uterine cancer Maternal Grandmother 84  . Hyperlipidemia Mother   . Hypertension Mother   . Multiple myeloma Father 51  . Lung cancer Maternal Manufacturing engineer  . Testicular cancer Other 37  The patient's father is living, currently age 84. He has a history of multiple myeloma. The patient's mother is 47.  She was diagnosed with ductal carcinoma in situ in 2016.The patient's parents live in [place] The patient had no brothers, 2 sisters. There is no history of breast or ovarian cancer in the family. She believes one of her grandfathers died from lung cancer and another from pancreatic cancer, but is not sure  GYNECOLOGIC HISTORY:  Menarche age 36, first live birth age 5, which the patient understands is an independent risk factor for breast cancer. She is GX P3. She was still having regular periods at the start of chemotherapy. LMP was March 2015. She tells me Debbie [name] checked her labs December  2015 and found that she was in the menopausal range. 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 Her husband Debbie [name] is a Energy manager at [employer]. Her 3 children are [name] (16), [name] (12) and [name] (9). The patient attends a local [employer]: Not in place   HEALTH MAINTENANCE: Social History   Tobacco Use  . Smoking status: Never Smoker  . Smokeless tobacco: Never Used  Substance Use Topics  . Alcohol use: No  . Drug use: No     Colonoscopy: January 2015  OBS:[id number] 2014  Bone density:  Lipid panel:  Allergies  Allergen Reactions  . Dust Mite Extract Itching    Current Outpatient Medications  Medication Sig Dispense Refill  . ALPRAZolam (XANAX) 0.5 MG tablet Take 20 minutes before MRI; may repeat once 5 tablet 0  . anastrozole (ARIMIDEX) 1 MG tablet Take 1 tablet (1 mg total) by mouth daily. 90 tablet 4  . cetirizine (ZYRTEC) 10 MG tablet Take 10 mg by mouth daily.    . Cholecalciferol (VITAMIN D3) 2000  units TABS Take 4 (four) times daily by mouth.    . frovatriptan (FROVA) 2.5 MG tablet Take 1 tablet (2.5 mg total) by mouth as needed for migraine. If recurs, may repeat after 2 hours. Max of 3 tabs in 24 hours. 10 tablet 0  . mometasone (NASONEX) 50 MCG/ACT nasal spray Place 2 sprays into the nose daily as needed (for allergies).     Marland Kitchen omeprazole (PRILOSEC) 20 MG capsule Take 1 capsule (20 mg total) by mouth 2 (two) times daily before a meal. (Patient taking differently: Take 20 mg daily as needed by mouth. ) 60 capsule 3  . oxymetazoline (AFRIN) 0.05 % nasal spray Place 2 sprays into the nose daily as needed. congestion    . palbociclib (IBRANCE) 125 MG capsule Take 1 capsule by mouth once daily with food. Take for 3 weeks on, 1 week off, repeat every 4 weeks 21 capsule 6  . PARoxetine (PAXIL) 20 MG tablet Take 20 mg by  mouth every morning.     No current facility-administered medications for this visit.     OBJECTIVE: Middle-aged white woman in no acute distress Vitals:   05/14/17 1306  BP: 119/65  Pulse: 91  Resp: 16  Temp: 97.7 F (36.5 C)  SpO2: 99%     Body mass index is 34.56 kg/m.    ECOG FS:0 - Asymptomatic  Sclerae unicteric, pupils round and equal Oropharynx clear and moist No cervical or supraclavicular adenopathy Lungs no rales or rhonchi Heart regular rate and rhythm Abd soft, nontender, positive bowel sounds MSK no focal spinal tenderness, no upper extremity lymphedema Neuro: nonfocal, well oriented, appropriate affect Breasts: The right breast is unremarkable.  The left breast is status post lumpectomy and radiation, with no evidence of local recurrence.  Both axillae are benign.       Component Value Date/Time   NA 139 04/28/2017 1326   K 4.1 04/28/2017 1326   CO2 20 (L) 04/28/2017 1326   GLUCOSE 152 (H) 04/28/2017 1326   BUN 12.7 04/28/2017 1326   CREATININE 0.8 04/28/2017 1326   CALCIUM 9.0 04/28/2017 1326   PROT 7.3 04/28/2017 1326   ALBUMIN 4.1 04/28/2017 1326   AST 23 04/28/2017 1326   ALT 37 04/28/2017 1326   ALKPHOS 94 04/28/2017 1326   BILITOT 0.42 04/28/2017 1326    I No results found for: SPEP  Lab Results  Component Value Date   WBC 2.9 (L) 04/28/2017   NEUTROABS 1.4 (L) 04/28/2017   HGB 12.7 04/28/2017   HCT 37.8 04/28/2017   MCV 84.4 04/28/2017   PLT 246 04/28/2017      Chemistry      Component Value Date/Time   NA 139 04/28/2017 1326   K 4.1 04/28/2017 1326   CO2 20 (L) 04/28/2017 1326   BUN 12.7 04/28/2017 1326   CREATININE 0.8 04/28/2017 1326      Component Value Date/Time   CALCIUM 9.0 04/28/2017 1326   ALKPHOS 94 04/28/2017 1326   AST 23 04/28/2017 1326   ALT 37 04/28/2017 1326   BILITOT 0.42 04/28/2017 1326       No results found for: LABCA2  No components found for: LABCA125  No results for input(s): INR in the last  168 hours.  Urinalysis    Component Value Date/Time   BILIRUBINUR Negative 09/22/2011 1451   PROTEINUR Trace 09/22/2011 1451   UROBILINOGEN negative 09/22/2011 1451   NITRITE Negative 09/22/2011 1451   LEUKOCYTESUR Trace 09/22/2011 1451    STUDIES:  CT Chest on 03/17/17 with results that showed: 1. Nodular and masslike pleural thickening throughout the left hemithorax with a small left pleural effusion, consistent with metastatic disease. 2. Enlarging AP window lymph node, worrisome for metastatic disease. 3. Hepatic steatosis.  PET scan on 03/24/2017 with results that showed: 1. Widespread metastatic disease to the left pleural space. These metastases extend into the fissures. No definite chest wall invasion.2. Single mildly hypermetabolic small subcarinal lymph node could reflect a metastasis as well. The AP window nodes noted on the recent chest CT are not hypermetabolic. 3. No other evidence of metastatic disease. 4. Stable small left pleural effusion. Hepatic steatosis.  She also underwent biopsy of a left pleural mass ([id number]) on 04/06/2017 with results showing: Metastatic carcinoma. Estrogen receptor: 95% positive strong staining intensity. Progesterone receptor: 90% positive strong staining intensity. HER-2 negative    ASSESSMENT: 51 y.o.  woman status post left breast lower inner quadrant and left axillary lymph node biopsy 06/30/2013 for a clinical T2 N1-2, stage IIB/IIIA invasive ductal carcinoma, grade 2, estrogen and progesterone receptors both strongly positive, HER-2 nonamplified, with an MIB-1 of 36%.  (1) there is a 5 mm left internal mammary lymph node which was negative on PET scan  (2) cyclophosphamide and doxorubicin started 08/04/2014, given in dose dense fashion x4 with Neulasta day 2; completed 09/14/2013, followed by paclitaxel again given in dose dense fashion x4, completed 11/09/2013  (3) left lumpectomy and axillary lymph node dissection 12/13/2013  at [facility] showed a residual pT2 pN1 (mic), stage IIB invasive ductal carcinoma; margins positive for DCIS cleared with subsequent surgery (01/03/2014)  (4) adjuvant radiation therapy completed 04/03/2014  (5) tamoxifen started December 2015, discontinued October 2018  (6) hepatic steatosis: documented on abdomianl US 08/07/2014, with elevated but stable LFTs,  (7) genetic testing 07/24/2013 through the  "Women's Hereditary Panel", performed at [facility], found no deleterious mutations in  ATM, BRCA1, BRCA2, BRIP1, CDH1, CHEK2 (c.1100delC only), EPCAM, MLH1, MSH2, MSH6, NBN, PALB2, PMS2, PTEN, RAD51C, STK11, and TP53.  (8) anastrozole started October 2018  (a) palbociclib added 04/12/2017  METASTATIC DISEASE: October 2018 (9) chest CT scan 03/17/2017 shows left pleural thickening and a left pleural effusion; PET scan 03/24/2017 shows no liver or bone involvement and no involvement of the right lung.  (a) biopsy of the left pleural mass 04/06/2017 confirms metastatic breast cancer, estrogen and progesterone receptor positive, HER-2 negative  (b) CA 27-27 was 86.3 on 03/30/2017  PLAN: Debbie [name] is tolerating her treatment remarkably well.  The question now is whether it will work for her.  I reassured her that this is generally very effective and when it works it tends to work for a considerable period of time.  She will start her second cycle of palbociclib today.  She will return to see me early January at the start cycle 3.  Before she starts cycle 4 she will have a restaging CT scan.  We reviewed her PET scan results which show no evidence of liver, bone, or right lung involvement.  That is favorable.  I again encouraged her to increase her exercise program.  We have tai chi and yoga here which she is welcome to participate in.  [facility]  She and her husband also are thinking of joining a gym on their own.  She is having mild  migraines.  These are not significantly different from baseline.  She has mild fatigue, and I think  the exercise will help.  She has vaginal dryness issues and once things are a little bit more stable I think she will profit from the "pelvic health" program here  Otherwise she will see me again in a month.  She knows to call for any problems that may develop before that visit.  Chauncey Cruel, [name] 05/14/17  1:13 PM Medical Oncology and Hematology [facility] [address] [place], [place] [address] Tel. [phone] Fax. [phone]  This document serves as a record of services personally performed by Lurline Del, [name]. It was created on his behalf by Sheron Nightingale, a trained medical scribe. The creation of this record is based on the scribe's personal observations and the provider's statements to them.   I have reviewed the above documentation for accuracy and completeness, and I agree with the above.

## 2017-05-14 ENCOUNTER — Other Ambulatory Visit (HOSPITAL_BASED_OUTPATIENT_CLINIC_OR_DEPARTMENT_OTHER): Payer: BLUE CROSS/BLUE SHIELD

## 2017-05-14 ENCOUNTER — Telehealth: Payer: Self-pay | Admitting: Oncology

## 2017-05-14 ENCOUNTER — Ambulatory Visit (HOSPITAL_BASED_OUTPATIENT_CLINIC_OR_DEPARTMENT_OTHER): Payer: BLUE CROSS/BLUE SHIELD | Admitting: Oncology

## 2017-05-14 VITALS — BP 119/65 | HR 91 | Temp 97.7°F | Resp 16 | Ht 64.5 in | Wt 204.5 lb

## 2017-05-14 DIAGNOSIS — N898 Other specified noninflammatory disorders of vagina: Secondary | ICD-10-CM | POA: Diagnosis not present

## 2017-05-14 DIAGNOSIS — R682 Dry mouth, unspecified: Secondary | ICD-10-CM

## 2017-05-14 DIAGNOSIS — C7800 Secondary malignant neoplasm of unspecified lung: Secondary | ICD-10-CM

## 2017-05-14 DIAGNOSIS — Z170 Estrogen receptor positive status [ER+]: Secondary | ICD-10-CM

## 2017-05-14 DIAGNOSIS — C7801 Secondary malignant neoplasm of right lung: Secondary | ICD-10-CM

## 2017-05-14 DIAGNOSIS — R5383 Other fatigue: Secondary | ICD-10-CM | POA: Diagnosis not present

## 2017-05-14 DIAGNOSIS — C50312 Malignant neoplasm of lower-inner quadrant of left female breast: Secondary | ICD-10-CM

## 2017-05-14 DIAGNOSIS — C773 Secondary and unspecified malignant neoplasm of axilla and upper limb lymph nodes: Secondary | ICD-10-CM

## 2017-05-14 DIAGNOSIS — G43909 Migraine, unspecified, not intractable, without status migrainosus: Secondary | ICD-10-CM | POA: Diagnosis not present

## 2017-05-14 NOTE — Telephone Encounter (Signed)
Gave patient AVS and calendar of upcoming January appointments °

## 2017-05-15 LAB — CANCER ANTIGEN 27.29: CA 27.29: 81.4 U/mL — ABNORMAL HIGH (ref 0.0–38.6)

## 2017-06-08 NOTE — Progress Notes (Signed)
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  She continues on anastrozole, with good tolerance. She notes that she has occasionally hot flashes that are not a major issue for her. She notes that she has issues with vaginal dryness.   She also takes palbociclib, with good tolerance. She notes that she has had diarrhea for the last few days. She notes that on the worst day, she had 4-5 episodes with significant volume. She notes it was liquid with some solid matter. She notes that she takes Imodium to aid this. She denies nausea. She notes that she is a little fatigued by the end of the day.   REVIEW OF SYSTEMS: [place] reports that she went to [place] to visit her family for the holidays.  She tells me of all her relatives there including 2 sisters and her mother the only one who cancer was a niece.  She notes that the majority of her husband's family live in [place]. She notes that prior to having issues with diarrhea, she was mostly constipated. For exercise, she notes that she tries to walk with her husband. Other than that she stays busy with her daily activities. She denies unusual headaches, visual changes, nausea, vomiting, or dizziness. There has been no unusual cough, phlegm production, or pleurisy. This been no change in bowel or bladder habits. She denies unexplained fatigue or unexplained weight loss, bleeding, rash, or fever. A detailed review of systems was otherwise stable.     BREAST CANCER HISTORY: As per previously documented note:  Debbie [name] had routine yearly screening mammography at the breast center 06/19/2013 showing a possible mass in the left breast. The right breast was unremarkable. On 06/30/2013 additional views of the left breast showed an irregular spiculated dense mass measuring 2.2 cm in the lower inner quadrant. This was palpable at the 8:00 location. Ultrasound showed an irregular hypoechoic mass measuring 2.3 cm and in the left axilla a dominant abnormal appearing lymph node with cortical thickening measuring 1.2 cm.  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  Subsequently the patient underwent bilateral breast MRI. This showed her breasts to be dense (composition C.). In the left breast at the 8:00 position there was a 2.4 cm enhancing mass containing a biopsy clip. There were no additional areas of concern. In the axilla on the left there was a 2.6 cm enlarged left axillary lymph node. There was also a 0.5 cm left internal mammary lymph nodes noted. The right side was unremarkable.  The patient's subsequent history is as detailed below.  PAST MEDICAL HISTORY: Past Medical History:  Diagnosis Date  . Abnormal Pap smear   . Anxiety   . Breast cancer ([facility]) 2015  ER+/PR+/Her2-  . Diabetes mellitus without complication ([facility])   . Hyperlipidemia   . IBS (irritable bowel syndrome)   . IBS (irritable bowel syndrome)   . Menorrhagia   . Migraines    menstral  . Migraines   . MVP (mitral valve prolapse)    echo 2/15-mitral valve normal-no regurg,no stenosis  . Radiation 02/14/14-04/03/14    Left Breast  . Seasonal allergies     PAST SURGICAL HISTORY: Past Surgical History:  Procedure Laterality Date  . ENDOMETRIAL ABLATION    . PORT-A-CATH REMOVAL N/A 05/15/2014   Procedure: REMOVAL PORT-A-CATH;  Surgeon: [name], MD;  Location: [facility];  Service: General;  Laterality: N/A;  . PORTACATH PLACEMENT Left 07/18/2013   Procedure: INSERTION PORT-A-CATH;  Surgeon: [name], MD;  Location: [facility];  Service: General;  Laterality: Left;  . SVD      x 3  . VULVA /PERINEUM BIOPSY  2011  . WISDOM TEETH REMOVAL      FAMILY HISTORY Family History  Problem Relation Age of Onset  . Cancer Maternal Grandmother        BREAST AND UTERINE CANCER  . Uterine cancer Maternal Grandmother 84  . Hyperlipidemia Mother   . Hypertension Mother   . Multiple myeloma Father 79  . Lung cancer Maternal Manufacturing engineer  . Testicular cancer Other 17  The patient's father is living, currently age 55. He has a history of multiple myeloma. The patient's mother is 66.  She was diagnosed with ductal carcinoma in situ in 2016.The patient's parents live in [place] The patient had no brothers, 2 sisters. There is no history of breast or ovarian cancer in the family. She believes one of her grandfathers died from lung cancer and another from pancreatic cancer, but is not sure  GYNECOLOGIC HISTORY:  Menarche age 45, first live birth age 60, which the patient understands is an independent risk factor for breast cancer. She is GX P3. She was still having regular periods at the start of chemotherapy. LMP was March 2015. She tells me Dr. Cletis Media checked her labs December 2015 and found that she was in the menopausal range. 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 Her husband Debbie [name] is a Energy manager at [employer]. Her 3 children are Debbie [name] (16), Debbie [name]  (12) and Debbie [name] (9). The patient attends a local [place]: Not in place   HEALTH MAINTENANCE: Social History   Tobacco Use  . Smoking status: Never Smoker  . Smokeless tobacco: Never Used  Substance Use Topics  . Alcohol use: No  . Drug use: No     Colonoscopy: January 2015  XAJ:OINOMVEH 2014  Bone density:  Lipid panel:  Allergies  Allergen Reactions  . Dust Mite Extract Itching    Current Outpatient Medications  Medication Sig Dispense Refill  . ALPRAZolam (XANAX) 0.5 MG tablet Take 20 minutes before MRI; may repeat once 5 tablet 0  . anastrozole (ARIMIDEX) 1 MG tablet Take 1 tablet (1 mg total) by mouth daily. 90 tablet 4  . cetirizine (ZYRTEC) 10 MG tablet Take 10 mg by mouth daily.    . Cholecalciferol (VITAMIN D3) 2000 units TABS Take 4 (four) times daily by mouth.    . frovatriptan (FROVA) 2.5 MG tablet Take 1 tablet (2.5 mg total) by mouth as  needed for migraine. If recurs, may repeat after 2 hours. Max of 3 tabs in 24 hours. 10 tablet 0  . mometasone (NASONEX) 50 MCG/ACT nasal spray Place 2 sprays into the nose daily as needed (for allergies).     Marland Kitchen omeprazole (PRILOSEC) 20 MG capsule Take 1 capsule (20 mg total) by mouth 2 (two) times daily before a meal. (Patient taking differently: Take 20 mg daily as needed by mouth. ) 60 capsule 3  . oxymetazoline (AFRIN) 0.05 % nasal spray Place 2 sprays into the nose daily as needed. congestion    . palbociclib (IBRANCE) 125 MG capsule Take 1 capsule by mouth once daily with food. Take for 3 weeks on, 1 week off, repeat every 4 weeks 21 capsule 6  . PARoxetine (PAXIL) 20 MG tablet Take 20 mg by mouth every morning.     No current facility-administered medications for this visit.     OBJECTIVE: Middle-aged white woman who appears stated age 52:   06/09/17 1435  BP: (!) 145/79  Pulse: 92  Resp: 18  Temp: 98.7 F (37.1 C)  SpO2: 96%     Body mass index is 35.15 kg/m.    ECOG FS:0 -  Asymptomatic  Sclerae unicteric, EOMs intact Oropharynx clear and moist No cervical or supraclavicular adenopathy Lungs no rales or rhonchi Heart regular rate and rhythm Abd soft, nontender, positive bowel sounds MSK no focal spinal tenderness, no upper extremity lymphedema Neuro: nonfocal, well oriented, appropriate affect Breasts: The right breast is benign.  The left breast is status post lumpectomy and radiation.  There is minimal distortion of the breast contour.  There is no evidence of local recurrence.  Both axillae are benign.        Component Value Date/Time   NA 139 04/28/2017 1326   K 4.1 04/28/2017 1326   CO2 20 (L) 04/28/2017 1326   GLUCOSE 152 (H) 04/28/2017 1326   BUN 12.7 04/28/2017 1326   CREATININE 0.8 04/28/2017 1326   CALCIUM 9.0 04/28/2017 1326   PROT 7.3 04/28/2017 1326   ALBUMIN 4.1 04/28/2017 1326   AST 23 04/28/2017 1326   ALT 37 04/28/2017 1326   ALKPHOS 94 04/28/2017 1326   BILITOT 0.42 04/28/2017 1326    I No results found for: SPEP  Lab Results  Component Value Date   WBC 3.5 (L) 06/09/2017   NEUTROABS 1.4 (L) 06/09/2017   HGB 12.9 06/09/2017   HCT 37.7 06/09/2017   MCV 87.3 06/09/2017   PLT 258 06/09/2017      Chemistry      Component Value Date/Time   NA 139 04/28/2017 1326   K 4.1 04/28/2017 1326   CO2 20 (L) 04/28/2017 1326   BUN 12.7 04/28/2017 1326   CREATININE 0.8 04/28/2017 1326      Component Value Date/Time   CALCIUM 9.0 04/28/2017 1326   ALKPHOS 94 04/28/2017 1326   AST 23 04/28/2017 1326   ALT 37 04/28/2017 1326   BILITOT 0.42 04/28/2017 1326       No results found for: LABCA2  No components found for: LABCA125  No results for input(s): INR in the last 168 hours.  Urinalysis    Component Value Date/Time   BILIRUBINUR Negative 09/22/2011 1451   PROTEINUR Trace 09/22/2011 1451   UROBILINOGEN negative 09/22/2011 1451   NITRITE Negative 09/22/2011 1451   LEUKOCYTESUR Trace 09/22/2011 1451     STUDIES: No results found.    ASSESSMENT: 52 y.o. [place] woman status post left breast lower  inner quadrant and left axillary lymph node biopsy 06/30/2013 for a clinical T2 N1-2, stage IIB/IIIA invasive ductal carcinoma, grade 2, estrogen and progesterone receptors both strongly positive, HER-2 nonamplified, with an MIB-1 of 36%.  (1) there is a 5 mm left internal mammary lymph node which was negative on PET scan  (2) cyclophosphamide and doxorubicin started 08/04/2014, given in dose dense fashion x4 with Neulasta day 2; completed 09/14/2013, followed by paclitaxel again given in dose dense fashion x4, completed 11/09/2013  (3) left lumpectomy and axillary lymph node dissection 12/13/2013 at [facility] showed a residual pT2 pN1 (mic), stage IIB invasive ductal carcinoma; margins positive for DCIS cleared with subsequent surgery (01/03/2014)  (4) adjuvant radiation therapy completed 04/03/2014  (5) tamoxifen started December 2015, discontinued October 2018  (6) hepatic steatosis: documented on abdomianl US 08/07/2014, with elevated but stable LFTs,  (7) genetic testing 07/24/2013 through the  "Women's Hereditary Panel", performed at [facility], found no deleterious mutations in  ATM, BRCA1, BRCA2, BRIP1, CDH1, CHEK2 (c.1100delC only), EPCAM, MLH1, MSH2, MSH6, NBN, PALB2, PMS2, PTEN, RAD51C, STK11, and TP53.  (8) METASTATIC DISEASE: October 2018 -- chest CT scan 03/17/2017 shows left pleural thickening and a left pleural effusion; PET scan 03/24/2017 shows no liver or bone involvement and no involvement of the right lung.  (a) biopsy of the left pleural mass 04/06/2017 confirms metastatic breast cancer, estrogen and progesterone receptor positive, HER-2 negative  (b) CA 27-27 was 86.3 on 03/30/2017  (9) anastrozole started October 2018  (a) palbociclib added 04/12/2017   PLAN: Nyala continues to tolerate her anastrozole/palbociclib combination remarkably well.  She  will be starting her third cycle 06/14/2017.  Before cycle 4 is started, we are going to repeat a CT of the chest.  I placed that order for February 6.  I have scheduled her to see me February 11.  This is when she would be starting cycle 4.  We will discussed the chest CT results at that time.  I am hopeful they will show some evidence of response.  We discussed her family's reluctance to discuss her cancer face-to-face.  They seem to have no compulsion asking about it over the phone.  I am sure that they are terrified that she is going to give them horrible news.  I suggested next time she visits [place] which will be this summer she broached the subject directly herself and let her know how she is doing and how she expects to do.  She is interested in our pelvic health program and I have placed a referral for that  She knows to call for any other issues that may develop before the next visit here.  Chauncey Cruel, MD 06/09/17  2:50 PM Medical Oncology and Hematology [facility] [address] [place], [place] [address] Tel. [phone] Fax. [phone]  This document serves as a record of services personally performed by Lurline Del, MD. It was created on his behalf by Sheron Nightingale, a trained medical scribe. The creation of this record is based on the scribe's personal observations and the provider's statements to them.   I have reviewed the above documentation for accuracy and completeness, and I agree with the above.

## 2017-06-09 ENCOUNTER — Inpatient Hospital Stay: Payer: BLUE CROSS/BLUE SHIELD

## 2017-06-09 ENCOUNTER — Inpatient Hospital Stay: Payer: BLUE CROSS/BLUE SHIELD | Attending: Oncology | Admitting: Oncology

## 2017-06-09 ENCOUNTER — Telehealth: Payer: Self-pay | Admitting: Oncology

## 2017-06-09 VITALS — BP 145/79 | HR 92 | Temp 98.7°F | Resp 18 | Ht 64.5 in | Wt 208.0 lb

## 2017-06-09 DIAGNOSIS — K589 Irritable bowel syndrome without diarrhea: Secondary | ICD-10-CM | POA: Diagnosis not present

## 2017-06-09 DIAGNOSIS — R197 Diarrhea, unspecified: Secondary | ICD-10-CM | POA: Insufficient documentation

## 2017-06-09 DIAGNOSIS — R232 Flushing: Secondary | ICD-10-CM | POA: Diagnosis not present

## 2017-06-09 DIAGNOSIS — C7800 Secondary malignant neoplasm of unspecified lung: Secondary | ICD-10-CM

## 2017-06-09 DIAGNOSIS — C50312 Malignant neoplasm of lower-inner quadrant of left female breast: Secondary | ICD-10-CM

## 2017-06-09 DIAGNOSIS — N920 Excessive and frequent menstruation with regular cycle: Secondary | ICD-10-CM | POA: Diagnosis not present

## 2017-06-09 DIAGNOSIS — N899 Noninflammatory disorder of vagina, unspecified: Secondary | ICD-10-CM | POA: Insufficient documentation

## 2017-06-09 DIAGNOSIS — Z803 Family history of malignant neoplasm of breast: Secondary | ICD-10-CM | POA: Diagnosis not present

## 2017-06-09 DIAGNOSIS — Z923 Personal history of irradiation: Secondary | ICD-10-CM | POA: Diagnosis not present

## 2017-06-09 DIAGNOSIS — K760 Fatty (change of) liver, not elsewhere classified: Secondary | ICD-10-CM | POA: Insufficient documentation

## 2017-06-09 DIAGNOSIS — E119 Type 2 diabetes mellitus without complications: Secondary | ICD-10-CM | POA: Diagnosis not present

## 2017-06-09 DIAGNOSIS — Z801 Family history of malignant neoplasm of trachea, bronchus and lung: Secondary | ICD-10-CM | POA: Insufficient documentation

## 2017-06-09 DIAGNOSIS — Z8041 Family history of malignant neoplasm of ovary: Secondary | ICD-10-CM

## 2017-06-09 DIAGNOSIS — I341 Nonrheumatic mitral (valve) prolapse: Secondary | ICD-10-CM | POA: Diagnosis not present

## 2017-06-09 DIAGNOSIS — E785 Hyperlipidemia, unspecified: Secondary | ICD-10-CM | POA: Insufficient documentation

## 2017-06-09 DIAGNOSIS — Z170 Estrogen receptor positive status [ER+]: Secondary | ICD-10-CM

## 2017-06-09 DIAGNOSIS — F419 Anxiety disorder, unspecified: Secondary | ICD-10-CM

## 2017-06-09 DIAGNOSIS — Z79811 Long term (current) use of aromatase inhibitors: Secondary | ICD-10-CM | POA: Diagnosis not present

## 2017-06-09 DIAGNOSIS — Z79899 Other long term (current) drug therapy: Secondary | ICD-10-CM | POA: Insufficient documentation

## 2017-06-09 DIAGNOSIS — R5383 Other fatigue: Secondary | ICD-10-CM | POA: Diagnosis not present

## 2017-06-09 LAB — COMPREHENSIVE METABOLIC PANEL
ALT: 44 U/L (ref 0–55)
AST: 28 U/L (ref 5–34)
Albumin: 4.1 g/dL (ref 3.5–5.0)
Alkaline Phosphatase: 71 U/L (ref 40–150)
Anion gap: 9 (ref 3–11)
BUN: 10 mg/dL (ref 7–26)
CO2: 23 mmol/L (ref 22–29)
Calcium: 9.2 mg/dL (ref 8.4–10.4)
Chloride: 110 mmol/L — ABNORMAL HIGH (ref 98–109)
Creatinine, Ser: 0.73 mg/dL (ref 0.60–1.10)
GFR calc Af Amer: >60 mL/min (ref >60)
GFR calc non Af Amer: >60 mL/min (ref >60)
Glucose, Bld: 109 mg/dL (ref 70–140)
Potassium: 4 mmol/L (ref 3.3–4.7)
Sodium: 142 mmol/L (ref 136–145)
Total Bilirubin: 0.3 mg/dL (ref 0.2–1.2)
Total Protein: 7.3 g/dL (ref 6.4–8.3)

## 2017-06-09 LAB — CBC WITH DIFFERENTIAL/PLATELET
Abs Granulocyte: 1.4 10*3/uL — ABNORMAL LOW (ref 1.5–6.5)
Basophils Absolute: 0 10*3/uL (ref 0.0–0.1)
Basophils Relative: 1 %
Eosinophils Absolute: 0.1 10*3/uL (ref 0.0–0.5)
Eosinophils Relative: 3 %
HCT: 37.7 % (ref 34.8–46.6)
Hemoglobin: 12.9 g/dL (ref 11.6–15.9)
Lymphocytes Relative: 46 %
Lymphs Abs: 1.7 10*3/uL (ref 0.9–3.3)
MCH: 29.9 pg (ref 25.1–34.0)
MCHC: 34.2 g/dL (ref 31.5–36.0)
MCV: 87.3 fL (ref 79.5–101.0)
Monocytes Absolute: 0.3 10*3/uL (ref 0.1–0.9)
Monocytes Relative: 9 %
Neutro Abs: 1.4 10*3/uL — ABNORMAL LOW (ref 1.5–6.5)
Neutrophils Relative %: 41 %
Platelets: 258 10*3/uL (ref 145–400)
RBC: 4.32 MIL/uL (ref 3.70–5.45)
RDW: 16.6 % — ABNORMAL HIGH (ref 11.2–16.1)
WBC: 3.5 10*3/uL — ABNORMAL LOW (ref 3.9–10.3)

## 2017-06-09 NOTE — Telephone Encounter (Signed)
Gave patient avs and calendar with appts per 1/9 los.  °

## 2017-06-10 LAB — CANCER ANTIGEN 27.29: CA 27.29: 57.2 U/mL — ABNORMAL HIGH (ref 0.0–38.6)

## 2017-06-14 DIAGNOSIS — C7800 Secondary malignant neoplasm of unspecified lung: Secondary | ICD-10-CM | POA: Diagnosis not present

## 2017-06-14 DIAGNOSIS — C50919 Malignant neoplasm of unspecified site of unspecified female breast: Secondary | ICD-10-CM | POA: Diagnosis not present

## 2017-06-14 DIAGNOSIS — Z6834 Body mass index (BMI) 34.0-34.9, adult: Secondary | ICD-10-CM | POA: Diagnosis not present

## 2017-06-16 ENCOUNTER — Ambulatory Visit: Payer: BLUE CROSS/BLUE SHIELD | Attending: Oncology | Admitting: Physical Therapy

## 2017-07-05 DIAGNOSIS — H40013 Open angle with borderline findings, low risk, bilateral: Secondary | ICD-10-CM | POA: Diagnosis not present

## 2017-07-07 ENCOUNTER — Ambulatory Visit (HOSPITAL_COMMUNITY)
Admission: RE | Admit: 2017-07-07 | Discharge: 2017-07-07 | Disposition: A | Discharge status: Home / Self Care | Payer: BLUE CROSS/BLUE SHIELD | Source: Ambulatory Visit | Attending: Oncology | Admitting: Oncology

## 2017-07-07 ENCOUNTER — Encounter (HOSPITAL_COMMUNITY): Payer: Self-pay

## 2017-07-07 DIAGNOSIS — C50312 Malignant neoplasm of lower-inner quadrant of left female breast: Secondary | ICD-10-CM | POA: Diagnosis not present

## 2017-07-07 DIAGNOSIS — C7800 Secondary malignant neoplasm of unspecified lung: Secondary | ICD-10-CM | POA: Diagnosis not present

## 2017-07-07 DIAGNOSIS — Z170 Estrogen receptor positive status [ER+]: Secondary | ICD-10-CM | POA: Diagnosis not present

## 2017-07-07 DIAGNOSIS — Z9889 Other specified postprocedural states: Secondary | ICD-10-CM | POA: Diagnosis not present

## 2017-07-07 DIAGNOSIS — R918 Other nonspecific abnormal finding of lung field: Secondary | ICD-10-CM | POA: Diagnosis not present

## 2017-07-07 DIAGNOSIS — C50919 Malignant neoplasm of unspecified site of unspecified female breast: Secondary | ICD-10-CM | POA: Diagnosis not present

## 2017-07-07 MED ORDER — IOPAMIDOL (ISOVUE-300) INJECTION 61%
INTRAVENOUS | Status: AC
Start: 2017-07-07 — End: 2017-07-07
  Administered 2017-07-07: 75 mL via INTRAVENOUS
  Filled 2017-07-07: qty 75

## 2017-07-07 MED ORDER — IOPAMIDOL (ISOVUE-300) INJECTION 61%: 75.0000 mL | Freq: Once | INTRAVENOUS | Status: DC | PRN

## 2017-07-07 MED ORDER — SODIUM CHLORIDE 0.9 % IJ SOLN
INTRAMUSCULAR | Status: AC
  Filled 2017-07-07: qty 50

## 2017-07-08 ENCOUNTER — Telehealth: Payer: Self-pay

## 2017-07-08 NOTE — Telephone Encounter (Signed)
Returned pt call regarding CT results. Informed her that per Dr. Jana Hakim although complex some lesions look improved while others look slightly thicker. Unfortunately this is all I could tell her and Dr. Jana Hakim is currently unavailable. Explained she could discuss this further at her appointment on Monday.  Cyndia Bent RN

## 2017-07-08 NOTE — Progress Notes (Signed)
[place]  [phone] Fax:([phone]) [phone]     ID: [name] OB: 1965/11/23  MR#: [id number]  QIW#:[id number]  PCP: Harlan Stains, MD GYN:  [name]: [name]; [name] OTHER MD: Cline Cools, [place]  CHIEF COMPLAINT: locally advanced estrogen receptor positive breast cancer  CURRENT TREATMENT:  Anastrozole, palbociclib  INTERVAL HISTORY: Yaira returns today for follow-up and treatment of her estrogen receptor positive metastatic breast cancer accompanied by her husband.  She continues on anastrozole, with good tolerance. She notes that she occasionally has hot flashes when she wakes up in the morning. She denies issues with vaginal dryness.  She also receives palbociclib, with good tolerance. She notes that she is more fatigued and she experiences some occasional diarrhea. She beigns her 21 day cycle today.  Since her last visit, she completed a chest CT on 07/07/2017 showing: Mixed response to therapy. The 2 dominant pleural based lesions overlying the left upper lobe demonstrate mild increase in volume compared with previous exam. Small subpleural lesion along the left heart border is mildly decreased in volume in the interval. Resolution of previous left pleural effusion. No new disease.  Her CA 27-27 shows improvement:  Ref Range & Units 50moago (06/09/17) 139mogo (05/14/17) 46m65moo (04/28/17)  CA 27.29 0.0 - 38.6 U/mL 57.2 Abnormally high   81.4 Abnormally high  CM 86.9 Abnormally high  CM  Comment: (NOTE   This is being repeated today.  Since her last visit here [facility] [facility] continuing current treatment until progression, then referral back to her for clinical trial consideration.   REVIEW OF SYSTEMS: [place] that she is working 2 jobs. She notes that she is temporarily working with a friend in the afternoons. She notes that she  joined the [employer], but she is not currently exercising. She denies unusual headaches, visual changes, nausea, vomiting, or dizziness. There has been no unusual cough, phlegm production, or pleurisy. This been no change in bowel or bladder habits. She denies unexplained fatigue or unexplained weight loss, bleeding, rash, or fever. A detailed review of systems was otherwise stable.    BREAST CANCER HISTORY: As per previously documented note:  TerFrankied routine yearly screening mammography at the breast center 06/19/2013 showing a possible mass in the left breast. The right breast was unremarkable. On 06/30/2013 additional views of the left breast showed an irregular spiculated dense mass measuring 2.2 cm in the lower inner quadrant. This was palpable at the 8:00 location. Ultrasound showed an irregular hypoechoic mass measuring 2.3 cm and in the left axilla a dominant abnormal appearing lymph node with cortical thickening measuring 1.2 cm.  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  Subsequently the patient underwent bilateral breast MRI. This showed her breasts to be dense (composition C.). In the left breast at the 8:00 position there was a 2.4 cm enhancing mass containing a biopsy clip. There were no additional areas of concern. In the axilla on the left there was a 2.6 cm enlarged left axillary lymph node. There was also a  0.5 cm left internal mammary lymph nodes noted. The right side was unremarkable.  The patient's subsequent history is as detailed below.  PAST MEDICAL HISTORY: Past Medical History:  Diagnosis Date  . Abnormal Pap smear   .  Anxiety   . Breast cancer ([place]) 2015   ER+/PR+/Her2-  . Diabetes mellitus without complication ([place])   . Hyperlipidemia   . IBS (irritable bowel syndrome)   . IBS (irritable bowel syndrome)   . Menorrhagia   . Migraines    menstral  . Migraines   . MVP (mitral valve prolapse)    echo 2/15-mitral valve normal-no regurg,no stenosis  . Radiation 02/14/14-04/03/14   Left Breast  . Seasonal allergies     PAST SURGICAL HISTORY: Past Surgical History:  Procedure Laterality Date  . ENDOMETRIAL ABLATION    . PORT-A-CATH REMOVAL N/A 05/15/2014   Procedure: REMOVAL PORT-A-CATH;  Surgeon: [name], MD;  Location: [facility];  Service: General;  Laterality: N/A;  . PORTACATH PLACEMENT Left 07/18/2013   Procedure: INSERTION PORT-A-CATH;  Surgeon: [name], MD;  Location: [facility];  Service: General;  Laterality: Left;  . SVD      x 3  . VULVA /PERINEUM BIOPSY  2011  . WISDOM TEETH REMOVAL      FAMILY HISTORY Family History  Problem Relation Age of Onset  . Cancer Maternal Grandmother        BREAST AND UTERINE CANCER  . Uterine cancer Maternal Grandmother 84  . Hyperlipidemia Mother   . Hypertension Mother   . Multiple myeloma Father 27  . Lung cancer Maternal Manufacturing engineer  . Testicular cancer Other 28  The patient's father is living, currently age 34. He has a history of multiple myeloma. The patient's mother is 22.  She was diagnosed with ductal carcinoma in situ in 2016.The patient's parents live in [place] The patient had no brothers, 2 sisters. There is no history of breast or ovarian cancer in the family. She believes one of her grandfathers died from lung cancer and another from pancreatic cancer, but is not sure  GYNECOLOGIC HISTORY:  Menarche age 40, first live birth age 30, which the patient understands is an independent risk factor for breast cancer. She is GX P3. She was still having regular periods at the start of chemotherapy. LMP  was March 2015. She tells me Dr. Cletis Media checked her labs December 2015 and found that she was in the menopausal range. 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 Her husband [name] is a Energy manager at [employer]. Her 3 children are [name] (16), [name] (12) and [name] (9). The patient attends a local [place]: Not in place   HEALTH MAINTENANCE: Social History   Tobacco Use  . Smoking status: Never Smoker  . Smokeless tobacco: Never Used  Substance Use Topics  . Alcohol use: No  . Drug use: No     Colonoscopy: January 2015  QVZ:DGLOVFIE 2014  Bone density:  Lipid panel:  Allergies  Allergen Reactions  . Dust Mite Extract Itching    Current Outpatient Medications  Medication Sig Dispense Refill  . ALPRAZolam (XANAX) 0.5 MG tablet Take 20 minutes before MRI; may repeat once 5 tablet 0  . anastrozole (ARIMIDEX) 1 MG tablet Take 1 tablet (1 mg total) by mouth daily. [place]  tablet 4  . cetirizine (ZYRTEC) 10 MG tablet Take 10 mg by mouth daily.    . Cholecalciferol (VITAMIN D3) 2000 units TABS Take 4 (four) times daily by mouth.    . frovatriptan (FROVA) 2.5 MG tablet Take 1 tablet (2.5 mg total) by mouth as needed for migraine. If recurs, may repeat after 2 hours. Max of 3 tabs in 24 hours. 10 tablet 0  . mometasone (NASONEX) 50 MCG/ACT nasal spray Place 2 sprays into the nose daily as needed (for allergies).     Marland Kitchen omeprazole (PRILOSEC) 20 MG capsule Take 1 capsule (20 mg total) by mouth 2 (two) times daily before a meal. (Patient taking differently: Take 20 mg daily as needed by mouth. ) 60 capsule 3  . oxymetazoline (AFRIN) 0.05 % nasal spray Place 2 sprays into the nose daily as needed. congestion    . palbociclib (IBRANCE) 125 MG capsule Take 1 capsule by mouth once daily with food. Take for 3 weeks on, 1 week off, repeat every 4  weeks 21 capsule 6  . PARoxetine (PAXIL) 20 MG tablet Take 20 mg by mouth every morning.     No current facility-administered medications for this visit.     OBJECTIVE: Middle-aged white woman who appears well  Vitals:   07/12/17 1230  BP: 126/75  Pulse: 92  Resp: 18  Temp: (!) 92 F (33.3 C)  SpO2: 98%     Body mass index is 35.78 kg/m.    ECOG FS:0 - Asymptomatic  Sclerae unicteric, pupils round and equal Oropharynx clear and moist No cervical or supraclavicular adenopathy Lungs no rales or rhonchi Heart regular rate and rhythm Abd soft, nontender, positive bowel sounds MSK no focal spinal tenderness, no upper extremity lymphedema Neuro: nonfocal, well oriented, appropriate affect Breasts: The right breast is unremarkable.  The left breast is undergone lumpectomy and radiation.  There is no evidence of local recurrence.  Both axillae are benign.      Component Value Date/Time   NA 142 06/09/2017 1411   NA 139 04/28/2017 1326   K 4.0 06/09/2017 1411   K 4.1 04/28/2017 1326   CL 110 (H) 06/09/2017 1411   CO2 23 06/09/2017 1411   CO2 20 (L) 04/28/2017 1326   GLUCOSE 109 06/09/2017 1411   GLUCOSE 152 (H) 04/28/2017 1326   BUN 10 06/09/2017 1411   BUN 12.7 04/28/2017 1326   CREATININE 0.73 06/09/2017 1411   CREATININE 0.8 04/28/2017 1326   CALCIUM 9.2 06/09/2017 1411   CALCIUM 9.0 04/28/2017 1326   PROT 7.3 06/09/2017 1411   PROT 7.3 04/28/2017 1326   ALBUMIN 4.1 06/09/2017 1411   ALBUMIN 4.1 04/28/2017 1326   AST 28 06/09/2017 1411   AST 23 04/28/2017 1326   ALT 44 06/09/2017 1411   ALT 37 04/28/2017 1326   ALKPHOS 71 06/09/2017 1411   ALKPHOS 94 04/28/2017 1326   BILITOT 0.3 06/09/2017 1411   BILITOT 0.42 04/28/2017 1326   GFRNONAA >60 06/09/2017 1411   GFRAA >60 06/09/2017 1411    I No results found for: SPEP  Lab Results  Component Value Date   WBC 4.2 07/12/2017   NEUTROABS 1.8 07/12/2017   HGB 13.1 07/12/2017   HCT 38.5 07/12/2017   MCV 89.1  07/12/2017   PLT 287 07/12/2017      Chemistry      Component Value Date/Time   NA 142 06/09/2017 1411   NA 139 04/28/2017 1326   K 4.0 06/09/2017 1411  K 4.1 04/28/2017 1326   CL 110 (H) 06/09/2017 1411   CO2 23 06/09/2017 1411   CO2 20 (L) 04/28/2017 1326   BUN 10 06/09/2017 1411   BUN 12.7 04/28/2017 1326   CREATININE 0.73 06/09/2017 1411   CREATININE 0.8 04/28/2017 1326      Component Value Date/Time   CALCIUM 9.2 06/09/2017 1411   CALCIUM 9.0 04/28/2017 1326   ALKPHOS 71 06/09/2017 1411   ALKPHOS 94 04/28/2017 1326   AST 28 06/09/2017 1411   AST 23 04/28/2017 1326   ALT 44 06/09/2017 1411   ALT 37 04/28/2017 1326   BILITOT 0.3 06/09/2017 1411   BILITOT 0.42 04/28/2017 1326       No results found for: LABCA2  No components found for: LABCA125  No results for input(s): INR in the last 168 hours.  Urinalysis    Component Value Date/Time   BILIRUBINUR Negative 09/22/2011 1451   PROTEINUR Trace 09/22/2011 1451   UROBILINOGEN negative 09/22/2011 1451   NITRITE Negative 09/22/2011 1451   LEUKOCYTESUR Trace 09/22/2011 1451    STUDIES: Ct Chest W Contrast  Result Date: 07/08/2017 CLINICAL DATA:  Followup metastatic breast cancer. EXAM: CT CHEST WITH CONTRAST TECHNIQUE: Multidetector CT imaging of the chest was performed during intravenous contrast administration. CONTRAST:  75 cc of Isovue 300 COMPARISON:  CT chest 03/17/2017 FINDINGS: Cardiovascular: The heart size appears within normal limits. No pericardial effusion. Mediastinum/Nodes: Thyroid gland appears normal. The trachea appears patent and is midline. Normal appearance of the esophagus. No enlarged mediastinal or hilar lymph nodes. No axillary or supraclavicular adenopathy. Previous left axillary nodal dissection. Lungs/Pleura: There is been resolution of previous left pleural effusion. 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 Previously 2.7 by 2.3 by 1.5 cm (volume = 4.9 cm^3). The posterolateral left upper lobe pleural mass measures 5.4 by 2.2 by 3.5 cm (volume = 22 cm^3), image 35 of series 2. Previously 5.8 by 2.1 by 3.2 cm (volume = 20 cm^3). Pleural lesion along the left heart border measures 1.0 by 0.4 by 0.7 cm (volume = 20 cm^3), image 77 of series 2. Previously 1.8 by 1.2 by 1.9 cm (volume = 2.1 cm^3) No right pleural effusion or nodularity. Upper Abdomen: No acute abnormality. Musculoskeletal: No chest wall abnormality. No acute or significant osseous findings. IMPRESSION: 1. Mixed response to therapy. The 2 dominant pleural based lesions overlying the left upper lobe demonstrate mild increase in volume compared with previous exam. Small subpleural lesion along the left heart border is mildly decreased in volume in the interval. Resolution of previous left pleural effusion. 2. No new disease. Electronically Signed   By: Kerby Moors M.D.   On: 07/08/2017 09:19      ASSESSMENT: 52 y.o. [place] woman status post left breast lower inner quadrant and left axillary lymph node biopsy 06/30/2013 for a clinical T2 N1-2, stage IIB/IIIA invasive ductal carcinoma, grade 2, estrogen and progesterone receptors both strongly positive, HER-2 nonamplified, with an MIB-1 of 36%.  (1) there is a 5 mm left internal mammary lymph node which was negative on PET scan  (2) cyclophosphamide and doxorubicin started 08/04/2014, given in dose dense fashion x4 with Neulasta day 2; completed 09/14/2013, followed by paclitaxel again given in dose dense fashion x4, completed 11/09/2013  (3) left lumpectomy and axillary lymph node dissection 12/13/2013 at [facility] showed a  residual pT2 pN1 (mic), stage IIB invasive ductal carcinoma; margins positive for DCIS cleared with subsequent surgery (01/03/2014)  (4) adjuvant radiation therapy completed 04/03/2014  (5) tamoxifen started  December 2015, discontinued October 2018  (6) hepatic steatosis: documented on abdomianl US 08/07/2014, with elevated but stable LFTs,  (7) genetic testing 07/24/2013 through the  "Women's Hereditary Panel", performed at [facility], found no deleterious mutations in  ATM, BRCA1, BRCA2, BRIP1, CDH1, CHEK2 (c.1100delC only), EPCAM, MLH1, MSH2, MSH6, NBN, PALB2, PMS2, PTEN, RAD51C, STK11, and TP53.  (8) METASTATIC DISEASE: October 2018 -- chest CT scan 03/17/2017 shows left pleural thickening and a left pleural effusion; PET scan 03/24/2017 shows no liver or bone involvement and no involvement of the right lung.  (a) biopsy of the left pleural mass 04/06/2017 confirms metastatic breast cancer, estrogen and progesterone receptor positive, HER-2 negative  (b) CA 27-27 was 86.3 on 03/30/2017  (9) anastrozole started October 2018  (a) palbociclib added 04/12/2017   PLAN: Njeri is tolerating her treatment remarkably well.  The question is is it working.  I think we have some evidence of early response.  One lesion is a little bit larger.  Another lesion is shorter although by volume either stable or slightly increased.  A third lesion we are measuring was actually smaller.  In addition the pleural fluid has completely resolved.  Also her CA-27-29 was significantly down when last checked a month ago  Overall I interpret her CT scan is showing some evidence of at least mixed response, overall early response.  I think it would be wise to continue this treatment for another 3 months and restage.  Accordingly she will return in 4 and 8 weeks simply for lab work and then see me again in 22 weeks.  Before the 12-week visit she will have a repeat CT scan of the chest.  Of course she knows to call me with any problems or concerns that may develop before her next visit.  Chauncey Cruel, MD 07/12/17  12:57 PM Medical Oncology and Hematology [facility] [place], [place] [address] Tel. [phone] Fax. [phone]  This document serves as a record of services personally performed by Lurline Del, MD. It was created on his behalf by Sheron Nightingale, a trained medical scribe. The creation of this record is based on the scribe's personal observations and the provider's statements to them.   I have reviewed the above documentation for accuracy and completeness, and I agree with the above.

## 2017-07-09 ENCOUNTER — Other Ambulatory Visit: Payer: Self-pay | Admitting: Oncology

## 2017-07-09 ENCOUNTER — Telehealth: Payer: Self-pay | Admitting: *Deleted

## 2017-07-09 NOTE — [facility] Oncology Navigator Note (Signed)
  Oncology Nurse Navigator Documentation  Navigator Location: [facility] (07/09/17 1600)   )Navigator Encounter Type: Telephone (07/09/17 1600)  Patient left a voice mail with questions about results from her most recent CT scan.  After discussing with Dr. Jana Hakim, I called patient and shared the information Dr. Jana Hakim instructed me to share and informed her that he will call her later today.  Patient was relieved that he said he felt good about the scan results and that he would be calling to discuss with her.                     Treatment Phase: Abnormal Scans (07/09/17 1600) Barriers/Navigation Needs: Education (07/09/17 1600) Education: Other(Questions about scan results.) (07/09/17 1600) Interventions: Education (07/09/17 1600)            Acuity: Level 1 (07/09/17 1600)         Time Spent with Patient: 15 (07/09/17 1600)

## 2017-07-09 NOTE — Progress Notes (Unsigned)
I called Debbie [name] to report the results of her restaging CT which is mixed.  Basically though I think we are seeing evidence of early response and in addition the CA-27-29 is down.  I would like to continue this therapy at least 2 more months and then repeat a CT of the chest.  Note that she has met with Dr. Winn Jock at [facility] and if there is evidence of disease progression she will be referred back there for studies

## 2017-07-12 ENCOUNTER — Inpatient Hospital Stay: Payer: BLUE CROSS/BLUE SHIELD | Attending: Oncology | Admitting: Oncology

## 2017-07-12 ENCOUNTER — Telehealth: Payer: Self-pay | Admitting: Oncology

## 2017-07-12 ENCOUNTER — Other Ambulatory Visit: Payer: Self-pay | Admitting: Oncology

## 2017-07-12 ENCOUNTER — Encounter: Payer: Self-pay | Admitting: Oncology

## 2017-07-12 ENCOUNTER — Inpatient Hospital Stay: Payer: BLUE CROSS/BLUE SHIELD

## 2017-07-12 VITALS — BP 126/75 | HR 92 | Temp 92.0°F | Resp 18 | Ht 64.5 in | Wt 211.7 lb

## 2017-07-12 DIAGNOSIS — Z803 Family history of malignant neoplasm of breast: Secondary | ICD-10-CM | POA: Diagnosis not present

## 2017-07-12 DIAGNOSIS — E119 Type 2 diabetes mellitus without complications: Secondary | ICD-10-CM | POA: Diagnosis not present

## 2017-07-12 DIAGNOSIS — C7802 Secondary malignant neoplasm of left lung: Secondary | ICD-10-CM

## 2017-07-12 DIAGNOSIS — N920 Excessive and frequent menstruation with regular cycle: Secondary | ICD-10-CM | POA: Diagnosis not present

## 2017-07-12 DIAGNOSIS — Z806 Family history of leukemia: Secondary | ICD-10-CM

## 2017-07-12 DIAGNOSIS — R232 Flushing: Secondary | ICD-10-CM | POA: Insufficient documentation

## 2017-07-12 DIAGNOSIS — R197 Diarrhea, unspecified: Secondary | ICD-10-CM | POA: Diagnosis not present

## 2017-07-12 DIAGNOSIS — K589 Irritable bowel syndrome without diarrhea: Secondary | ICD-10-CM | POA: Insufficient documentation

## 2017-07-12 DIAGNOSIS — Z79899 Other long term (current) drug therapy: Secondary | ICD-10-CM | POA: Insufficient documentation

## 2017-07-12 DIAGNOSIS — E785 Hyperlipidemia, unspecified: Secondary | ICD-10-CM | POA: Diagnosis not present

## 2017-07-12 DIAGNOSIS — Z170 Estrogen receptor positive status [ER+]: Secondary | ICD-10-CM | POA: Diagnosis not present

## 2017-07-12 DIAGNOSIS — K760 Fatty (change of) liver, not elsewhere classified: Secondary | ICD-10-CM | POA: Insufficient documentation

## 2017-07-12 DIAGNOSIS — Z923 Personal history of irradiation: Secondary | ICD-10-CM | POA: Insufficient documentation

## 2017-07-12 DIAGNOSIS — F419 Anxiety disorder, unspecified: Secondary | ICD-10-CM | POA: Insufficient documentation

## 2017-07-12 DIAGNOSIS — C50312 Malignant neoplasm of lower-inner quadrant of left female breast: Secondary | ICD-10-CM

## 2017-07-12 DIAGNOSIS — R5383 Other fatigue: Secondary | ICD-10-CM

## 2017-07-12 DIAGNOSIS — I341 Nonrheumatic mitral (valve) prolapse: Secondary | ICD-10-CM | POA: Insufficient documentation

## 2017-07-12 DIAGNOSIS — C7800 Secondary malignant neoplasm of unspecified lung: Secondary | ICD-10-CM

## 2017-07-12 DIAGNOSIS — Z79811 Long term (current) use of aromatase inhibitors: Secondary | ICD-10-CM | POA: Diagnosis not present

## 2017-07-12 LAB — CBC WITH DIFFERENTIAL/PLATELET
Basophils Absolute: 0 10*3/uL (ref 0.0–0.1)
Basophils Relative: 1 %
Eosinophils Absolute: 0.1 10*3/uL (ref 0.0–0.5)
Eosinophils Relative: 2 %
HCT: 38.5 % (ref 34.8–46.6)
Hemoglobin: 13.1 g/dL (ref 11.6–15.9)
Lymphocytes Relative: 39 %
Lymphs Abs: 1.6 10*3/uL (ref 0.9–3.3)
MCH: 30.4 pg (ref 25.1–34.0)
MCHC: 34.1 g/dL (ref 31.5–36.0)
MCV: 89.1 fL (ref 79.5–101.0)
Monocytes Absolute: 0.6 10*3/uL (ref 0.1–0.9)
Monocytes Relative: 15 %
Neutro Abs: 1.8 10*3/uL (ref 1.5–6.5)
Neutrophils Relative %: 43 %
Platelets: 287 10*3/uL (ref 145–400)
RBC: 4.32 MIL/uL (ref 3.70–5.45)
RDW: 18.2 % — ABNORMAL HIGH (ref 11.2–14.5)
WBC: 4.2 10*3/uL (ref 3.9–10.3)

## 2017-07-12 LAB — COMPREHENSIVE METABOLIC PANEL
ALT: 45 U/L (ref 0–55)
AST: 30 U/L (ref 5–34)
Albumin: 4.2 g/dL (ref 3.5–5.0)
Alkaline Phosphatase: 75 U/L (ref 40–150)
Anion gap: 11 (ref 3–11)
BUN: 9 mg/dL (ref 7–26)
CO2: 23 mmol/L (ref 22–29)
Calcium: 9.7 mg/dL (ref 8.4–10.4)
Chloride: 106 mmol/L (ref 98–109)
Creatinine, Ser: 0.72 mg/dL (ref 0.60–1.10)
GFR calc Af Amer: >60 mL/min (ref >60)
GFR calc non Af Amer: >60 mL/min (ref >60)
Glucose, Bld: 110 mg/dL (ref 70–140)
Potassium: 4 mmol/L (ref 3.5–5.1)
Sodium: 140 mmol/L (ref 136–145)
Total Bilirubin: 0.4 mg/dL (ref 0.2–1.2)
Total Protein: 7.3 g/dL (ref 6.4–8.3)

## 2017-07-12 NOTE — Progress Notes (Signed)
Pt came by inquiring about financial assistance.  After reviewing the account I explained I'm not able to see where I can offer additional assistance because pt has already received the one-time [employer] grant in the past and she's already receiving assistance for Ibrance, there is nothing else to apply for.  Pt verbalized understanding.

## 2017-07-12 NOTE — Telephone Encounter (Signed)
Gave patient AVS and calendar of upcoming February through May appointments.  °

## 2017-07-13 LAB — CANCER ANTIGEN 27.29: CA 27.29: 34.2 U/mL (ref 0.0–38.6)

## 2017-07-28 DIAGNOSIS — C50912 Malignant neoplasm of unspecified site of left female breast: Secondary | ICD-10-CM | POA: Diagnosis not present

## 2017-07-28 DIAGNOSIS — Z6835 Body mass index (BMI) 35.0-35.9, adult: Secondary | ICD-10-CM | POA: Diagnosis not present

## 2017-07-28 DIAGNOSIS — Z1211 Encounter for screening for malignant neoplasm of colon: Secondary | ICD-10-CM | POA: Diagnosis not present

## 2017-07-28 DIAGNOSIS — Z01419 Encounter for gynecological examination (general) (routine) without abnormal findings: Secondary | ICD-10-CM | POA: Diagnosis not present

## 2017-08-09 ENCOUNTER — Inpatient Hospital Stay: Payer: BLUE CROSS/BLUE SHIELD | Attending: Oncology

## 2017-08-09 DIAGNOSIS — C50312 Malignant neoplasm of lower-inner quadrant of left female breast: Secondary | ICD-10-CM | POA: Diagnosis not present

## 2017-08-09 DIAGNOSIS — Z170 Estrogen receptor positive status [ER+]: Secondary | ICD-10-CM | POA: Insufficient documentation

## 2017-08-09 DIAGNOSIS — C7800 Secondary malignant neoplasm of unspecified lung: Secondary | ICD-10-CM

## 2017-08-09 LAB — CBC WITH DIFFERENTIAL/PLATELET
Basophils Absolute: 0 10*3/uL (ref 0.0–0.1)
Basophils Relative: 1 %
Eosinophils Absolute: 0.1 10*3/uL (ref 0.0–0.5)
Eosinophils Relative: 2 %
HCT: 38.8 % (ref 34.8–46.6)
Hemoglobin: 13.6 g/dL (ref 11.6–15.9)
Lymphocytes Relative: 38 %
Lymphs Abs: 1.7 10*3/uL (ref 0.9–3.3)
MCH: 31.8 pg (ref 25.1–34.0)
MCHC: 35.1 g/dL (ref 31.5–36.0)
MCV: 90.7 fL (ref 79.5–101.0)
Monocytes Absolute: 0.6 10*3/uL (ref 0.1–0.9)
Monocytes Relative: 13 %
Neutro Abs: 2 10*3/uL (ref 1.5–6.5)
Neutrophils Relative %: 46 %
Platelets: 269 10*3/uL (ref 145–400)
RBC: 4.28 MIL/uL (ref 3.70–5.45)
RDW: 15 % — ABNORMAL HIGH (ref 11.2–14.5)
WBC: 4.4 10*3/uL (ref 3.9–10.3)

## 2017-08-09 LAB — COMPREHENSIVE METABOLIC PANEL
ALT: 62 U/L — ABNORMAL HIGH (ref 0–55)
AST: 37 U/L — ABNORMAL HIGH (ref 5–34)
Albumin: 4.3 g/dL (ref 3.5–5.0)
Alkaline Phosphatase: 79 U/L (ref 40–150)
Anion gap: 13 — ABNORMAL HIGH (ref 3–11)
BUN: 12 mg/dL (ref 7–26)
CO2: 22 mmol/L (ref 22–29)
Calcium: 10.1 mg/dL (ref 8.4–10.4)
Chloride: 105 mmol/L (ref 98–109)
Creatinine, Ser: 0.78 mg/dL (ref 0.60–1.10)
GFR calc Af Amer: >60 mL/min (ref >60)
GFR calc non Af Amer: >60 mL/min (ref >60)
Glucose, Bld: 118 mg/dL (ref 70–140)
Potassium: 3.9 mmol/L (ref 3.5–5.1)
Sodium: 140 mmol/L (ref 136–145)
Total Bilirubin: 0.5 mg/dL (ref 0.2–1.2)
Total Protein: 7.6 g/dL (ref 6.4–8.3)

## 2017-08-10 LAB — CANCER ANTIGEN 27.29: CA 27.29: 37.1 U/mL (ref 0.0–38.6)

## 2017-09-03 ENCOUNTER — Other Ambulatory Visit: Payer: Self-pay | Admitting: *Deleted

## 2017-09-06 ENCOUNTER — Other Ambulatory Visit: Payer: BLUE CROSS/BLUE SHIELD

## 2017-09-08 DIAGNOSIS — G43909 Migraine, unspecified, not intractable, without status migrainosus: Secondary | ICD-10-CM | POA: Diagnosis not present

## 2017-09-10 DIAGNOSIS — Z853 Personal history of malignant neoplasm of breast: Secondary | ICD-10-CM | POA: Diagnosis not present

## 2017-09-10 DIAGNOSIS — N950 Postmenopausal bleeding: Secondary | ICD-10-CM | POA: Diagnosis not present

## 2017-09-10 DIAGNOSIS — N939 Abnormal uterine and vaginal bleeding, unspecified: Secondary | ICD-10-CM | POA: Diagnosis not present

## 2017-09-13 ENCOUNTER — Other Ambulatory Visit: Payer: Self-pay

## 2017-09-13 ENCOUNTER — Inpatient Hospital Stay: Payer: BLUE CROSS/BLUE SHIELD | Attending: Oncology

## 2017-09-13 ENCOUNTER — Other Ambulatory Visit: Payer: Self-pay | Admitting: Obstetrics and Gynecology

## 2017-09-13 ENCOUNTER — Encounter (HOSPITAL_COMMUNITY): Payer: Self-pay | Admitting: *Deleted

## 2017-09-13 ENCOUNTER — Encounter (HOSPITAL_COMMUNITY)
Admission: RE | Admit: 2017-09-13 | Discharge: 2017-09-13 | Disposition: A | Discharge status: Home / Self Care | Payer: BLUE CROSS/BLUE SHIELD | Source: Ambulatory Visit | Attending: Obstetrics and Gynecology | Admitting: Obstetrics and Gynecology

## 2017-09-13 ENCOUNTER — Other Ambulatory Visit (HOSPITAL_COMMUNITY): Payer: BLUE CROSS/BLUE SHIELD

## 2017-09-13 ENCOUNTER — Encounter (HOSPITAL_COMMUNITY): Payer: Self-pay

## 2017-09-13 DIAGNOSIS — N950 Postmenopausal bleeding: Secondary | ICD-10-CM | POA: Diagnosis not present

## 2017-09-13 DIAGNOSIS — Z170 Estrogen receptor positive status [ER+]: Secondary | ICD-10-CM | POA: Insufficient documentation

## 2017-09-13 DIAGNOSIS — Z853 Personal history of malignant neoplasm of breast: Secondary | ICD-10-CM | POA: Diagnosis not present

## 2017-09-13 DIAGNOSIS — C7800 Secondary malignant neoplasm of unspecified lung: Secondary | ICD-10-CM

## 2017-09-13 DIAGNOSIS — Z79899 Other long term (current) drug therapy: Secondary | ICD-10-CM | POA: Diagnosis not present

## 2017-09-13 DIAGNOSIS — C50312 Malignant neoplasm of lower-inner quadrant of left female breast: Secondary | ICD-10-CM | POA: Diagnosis not present

## 2017-09-13 DIAGNOSIS — K219 Gastro-esophageal reflux disease without esophagitis: Secondary | ICD-10-CM | POA: Diagnosis not present

## 2017-09-13 DIAGNOSIS — F419 Anxiety disorder, unspecified: Secondary | ICD-10-CM | POA: Diagnosis not present

## 2017-09-13 HISTORY — DX: Gastro-esophageal reflux disease without esophagitis: K21.9

## 2017-09-13 HISTORY — DX: Prediabetes: R73.03

## 2017-09-13 LAB — CBC WITH DIFFERENTIAL/PLATELET
Basophils Absolute: 0 10*3/uL (ref 0.0–0.1)
Basophils Relative: 1 %
Eosinophils Absolute: 0.1 10*3/uL (ref 0.0–0.5)
Eosinophils Relative: 3 %
HCT: 37.1 % (ref 34.8–46.6)
Hemoglobin: 12.8 g/dL (ref 11.6–15.9)
Lymphocytes Relative: 50 %
Lymphs Abs: 2 10*3/uL (ref 0.9–3.3)
MCH: 31.3 pg (ref 25.1–34.0)
MCHC: 34.5 g/dL (ref 31.5–36.0)
MCV: 90.7 fL (ref 79.5–101.0)
Monocytes Absolute: 0.5 10*3/uL (ref 0.1–0.9)
Monocytes Relative: 12 %
Neutro Abs: 1.3 10*3/uL — ABNORMAL LOW (ref 1.5–6.5)
Neutrophils Relative %: 34 %
Platelets: 271 10*3/uL (ref 145–400)
RBC: 4.09 MIL/uL (ref 3.70–5.45)
RDW: 14.7 % — ABNORMAL HIGH (ref 11.2–14.5)
WBC: 3.9 10*3/uL (ref 3.9–10.3)

## 2017-09-13 LAB — COMPREHENSIVE METABOLIC PANEL
ALT: 58 U/L — ABNORMAL HIGH (ref 0–55)
AST: 32 U/L (ref 5–34)
Albumin: 4.1 g/dL (ref 3.5–5.0)
Alkaline Phosphatase: 94 U/L (ref 40–150)
Anion gap: 9 (ref 3–11)
BUN: 7 mg/dL (ref 7–26)
CO2: 23 mmol/L (ref 22–29)
Calcium: 9.6 mg/dL (ref 8.4–10.4)
Chloride: 107 mmol/L (ref 98–109)
Creatinine, Ser: 0.72 mg/dL (ref 0.60–1.10)
GFR calc Af Amer: >60 mL/min (ref >60)
GFR calc non Af Amer: >60 mL/min (ref >60)
Glucose, Bld: 115 mg/dL (ref 70–140)
Potassium: 3.9 mmol/L (ref 3.5–5.1)
Sodium: 139 mmol/L (ref 136–145)
Total Bilirubin: 0.3 mg/dL (ref 0.2–1.2)
Total Protein: 7.2 g/dL (ref 6.4–8.3)

## 2017-09-13 NOTE — Patient Instructions (Addendum)
Your procedure is scheduled on: Tuesday, April 16  Enter through the Main Entrance of [facility] at: 11:30 am  Pick up the phone at the desk and dial [phone].  Call this number if you have problems the morning of surgery: [phone].  Remember: Do NOT eat food after midnight Monday  Do NOT drink clear liquids (including water) after 7 am Tuesday, day of surgery  Take these medicines the morning of surgery with a SIP OF WATER:  Zyrtec, Prilosec, Paxil and xanax if needed.  You may use nasanex nasal spray if needed.  Stop herbal medications, vitamin supplements and ibupofen at this time.  Do NOT wear jewelry (body piercing), metal hair clips/bobby pins, make-up, or nail polish. Do NOT wear lotions, powders, or perfumes.  You may wear deoderant. Do NOT shave for 48 hours prior to surgery. Do NOT bring valuables to the hospital.  Have a responsible adult drive you home and stay with you for 24 hours after your procedure.  Home with husband Debbie [name] cell [phone].

## 2017-09-13 NOTE — H&P (Signed)
Debbie [name] is a 52 y.o. female, P: 3-0-1-3 who is S/P endometrial ablation, with metastatic breast cancer  who presents for a hysteroscopy D & C because of post menopausal bleeding. The patient was diagnosed with breast cancer in 2015 and following her mastectomy and other treatments she was placed on Tamoxifen until October 2018.  In March 2018 she  developed post menopausal bleeding and was noted to have, on ultrasound,   a complex 3.1 cm endometrium with increased vascularity.   Consequently she underwent  a hysteroscopy with D & C.  The pathology from that procedure was benign, showing no hyperplasia, atypia or malignancy.  The same symptoms recurred in September 2018 and an endometrial biopsy returning,  benign polypoid endometrium with weakly proliferative, metaplastic and atrophic changes but no hyperplasia, atypia or malignancy.  Once Tamoifen was discontinued in October, the patent began Anastrozole 1 mg daily along with Ibrance 125 mg.  The patient remained free of any vaginal bleeding until April 2019 when she reported 3 days of vaginal spotting accompanied by  minimal cramping.  A pelvic ultrasound that followed revealed: an anteverted uterus: 5.92 x 4.28 x 6.01 cm;  a  dynamic endometrium  ranging in width from  7.8 ;  10.3 mm; right ovary-3.00 cm and left ovary-2.59 cm. She denies any changes in her bowel or bladder function and has not had any vaginitis symptoms.   The patient declined a repeat endometrial biopsy to evaluate her endometrium and instead requested a  hysteroscopy with D & C.     Past Medical History  OB History: G: 4;  P: 3-0-1-3;  SVB: 2000, 2004 and 2007  GYN History: menarche: 52 YO    LMP: 2015    Contracepton: Vasectomy;   Denies history of abnormal PAP smear or STDs.   Last PAP smear: 2018-normal  Medical History:  Migraine and Left Breast Cancer (metastatic)  Surgical History: 2013  Endometrial Ablation and 2015  Left Breast Mastectomy Denies problems with anesthesia  or history of blood transfusions  Family History:  Breast Cancer, Multiple Myeloma and Parkinson's Disease  Social History: Married    and   Denies tobacco or alcohol use   Medications:  Ibrance 125 mg daily Anastrozole 1 mg daily Frova 2.5 mg  po stat prn Alprazolam 0.5 mg  prn Paroxetine 20 mg daily Vitamin D 50,000 IU  weekly Zyrtec 10 mg daily  Allergies  Allergen Reactions  . Dust Mite Extract Itching   NKDA  ROS:  Admits to fatigue but  denies headache, vision changes, nasal congestion, dysphagia, tinnitus, dizziness, hoarseness, cough,  chest pain, shortness of breath, nausea, vomiting, diarrhea,constipation,  urinary frequency, urgency  dysuria, hematuria, vaginitis symptoms, pelvic pain, swelling of joints,easy bruising,  myalgias, arthralgias, skin rashes, unexplained weight loss and except as is mentioned in the history of present illness, patient's review of systems is otherwise negative.     Physical Exam    Bp: 118/74   P: 93 bpm   Temperature:  98 degree F orally    Weight: 215 lbs  Height: 5' 4.5"  BMI: 36.3  Neck: supple without masses or thyromegaly Lungs: clear to auscultation Heart: regular rate and rhythm Abdomen: soft, non-tender and no organomegaly Pelvic:EGBUS- wnl; vagina-normal rugae; uterus-normal size, cervix without lesions or motion tenderness; adnexae-no tenderness or masses Extremities:  no clubbing, cyanosis or edema   Assesment:  Post Menopausal Bleeding  History of Tamoxifen Therapy                       Metastatic Breast Cancer (Left)                       S/P Endometrial Ablation   Disposition:  A discussion was held with patient regarding the indication for her procedure(s) along with the risks, which include but are not limited to: reaction to anesthesia, damage to adjacent organs to include uterine perforation, infection and excessive bleeding. The patient verbalized understanding of these risks and has  consented to proceed with a Hysteroscopy, Dilatation and Curettage at [facility] on September 14, 2017 at 1 p.m.  CSN# [id number]   Iana Buzan J. Florene Glen, PA-C  for Dr. Dede Query. [place].

## 2017-09-14 ENCOUNTER — Encounter (HOSPITAL_COMMUNITY)
Admission: RE | Disposition: A | Discharge status: Home / Self Care | Payer: Self-pay | Source: Ambulatory Visit | Attending: Obstetrics and Gynecology

## 2017-09-14 ENCOUNTER — Ambulatory Visit (HOSPITAL_COMMUNITY)
Admission: RE | Admit: 2017-09-14 | Discharge: 2017-09-14 | Disposition: A | Discharge status: Home / Self Care | Payer: BLUE CROSS/BLUE SHIELD | Source: Ambulatory Visit | Attending: Obstetrics and Gynecology | Admitting: Obstetrics and Gynecology

## 2017-09-14 ENCOUNTER — Encounter (HOSPITAL_COMMUNITY): Payer: Self-pay | Admitting: Emergency Medicine

## 2017-09-14 ENCOUNTER — Ambulatory Visit (HOSPITAL_COMMUNITY): Payer: BLUE CROSS/BLUE SHIELD | Admitting: Anesthesiology

## 2017-09-14 ENCOUNTER — Other Ambulatory Visit: Payer: Self-pay

## 2017-09-14 DIAGNOSIS — E7800 Pure hypercholesterolemia, unspecified: Secondary | ICD-10-CM | POA: Diagnosis not present

## 2017-09-14 DIAGNOSIS — K219 Gastro-esophageal reflux disease without esophagitis: Secondary | ICD-10-CM | POA: Diagnosis not present

## 2017-09-14 DIAGNOSIS — N858 Other specified noninflammatory disorders of uterus: Secondary | ICD-10-CM | POA: Diagnosis not present

## 2017-09-14 DIAGNOSIS — F419 Anxiety disorder, unspecified: Secondary | ICD-10-CM | POA: Diagnosis not present

## 2017-09-14 DIAGNOSIS — N950 Postmenopausal bleeding: Secondary | ICD-10-CM | POA: Insufficient documentation

## 2017-09-14 DIAGNOSIS — Z170 Estrogen receptor positive status [ER+]: Secondary | ICD-10-CM | POA: Diagnosis not present

## 2017-09-14 DIAGNOSIS — C7981 Secondary malignant neoplasm of breast: Secondary | ICD-10-CM | POA: Diagnosis not present

## 2017-09-14 DIAGNOSIS — Z853 Personal history of malignant neoplasm of breast: Secondary | ICD-10-CM | POA: Insufficient documentation

## 2017-09-14 DIAGNOSIS — Z79899 Other long term (current) drug therapy: Secondary | ICD-10-CM | POA: Insufficient documentation

## 2017-09-14 DIAGNOSIS — C3490 Malignant neoplasm of unspecified part of unspecified bronchus or lung: Secondary | ICD-10-CM | POA: Diagnosis not present

## 2017-09-14 HISTORY — PX: DILATATION & CURRETTAGE/HYSTEROSCOPY WITH RESECTOCOPE: SHX5572

## 2017-09-14 LAB — CANCER ANTIGEN 27.29: CA 27.29: 29.3 U/mL (ref 0.0–38.6)

## 2017-09-14 SURGERY — DILATATION & CURETTAGE/HYSTEROSCOPY WITH RESECTOCOPE
Anesthesia: General

## 2017-09-14 MED ORDER — ONDANSETRON HCL 4 MG/2ML IJ SOLN
INTRAMUSCULAR | Status: AC
  Filled 2017-09-14: qty 2

## 2017-09-14 MED ORDER — FENTANYL CITRATE (PF) 100 MCG/2ML IJ SOLN
INTRAMUSCULAR | Status: DC | PRN
  Administered 2017-09-14: 100 ug via INTRAVENOUS

## 2017-09-14 MED ORDER — LIDOCAINE HCL (CARDIAC) 20 MG/ML IV SOLN
INTRAVENOUS | Status: AC
  Filled 2017-09-14: qty 5

## 2017-09-14 MED ORDER — SODIUM CHLORIDE 0.9 % IR SOLN
Status: DC | PRN
  Administered 2017-09-14: 3000 mL

## 2017-09-14 MED ORDER — CHLOROPROCAINE HCL 1 % IJ SOLN
INTRAMUSCULAR | Status: DC | PRN
  Administered 2017-09-14: 10 mL

## 2017-09-14 MED ORDER — LACTATED RINGERS IV SOLN
INTRAVENOUS | Status: DC
  Administered 2017-09-14: 12:00:00 via INTRAVENOUS

## 2017-09-14 MED ORDER — PROPOFOL 10 MG/ML IV BOLUS
INTRAVENOUS | Status: DC | PRN
  Administered 2017-09-14: 180 mg via INTRAVENOUS

## 2017-09-14 MED ORDER — CEFAZOLIN SODIUM-DEXTROSE 2-4 GM/100ML-% IV SOLN
2.0000 g | INTRAVENOUS | Status: AC
  Administered 2017-09-14: 2 g via INTRAVENOUS

## 2017-09-14 MED ORDER — KETOROLAC TROMETHAMINE 30 MG/ML IJ SOLN
INTRAMUSCULAR | Status: AC
  Filled 2017-09-14: qty 1

## 2017-09-14 MED ORDER — PROPOFOL 10 MG/ML IV BOLUS
INTRAVENOUS | Status: AC
  Filled 2017-09-14: qty 20

## 2017-09-14 MED ORDER — SCOPOLAMINE 1 MG/3DAYS TD PT72
MEDICATED_PATCH | TRANSDERMAL | Status: AC
  Administered 2017-09-14: 1.5 mg via TRANSDERMAL
  Filled 2017-09-14: qty 1

## 2017-09-14 MED ORDER — SILVER NITRATE-POT NITRATE 75-25 % EX MISC
CUTANEOUS | Status: DC | PRN
  Administered 2017-09-14: 1 via TOPICAL

## 2017-09-14 MED ORDER — DEXAMETHASONE SODIUM PHOSPHATE 4 MG/ML IJ SOLN
INTRAMUSCULAR | Status: AC
  Filled 2017-09-14: qty 1

## 2017-09-14 MED ORDER — MIDAZOLAM HCL 2 MG/2ML IJ SOLN
INTRAMUSCULAR | Status: AC
  Filled 2017-09-14: qty 2

## 2017-09-14 MED ORDER — CHLOROPROCAINE HCL 1 % IJ SOLN
INTRAMUSCULAR | Status: AC
  Filled 2017-09-14: qty 30

## 2017-09-14 MED ORDER — DEXAMETHASONE SODIUM PHOSPHATE 10 MG/ML IJ SOLN
INTRAMUSCULAR | Status: DC | PRN
  Administered 2017-09-14: 4 mg via INTRAVENOUS

## 2017-09-14 MED ORDER — SCOPOLAMINE 1 MG/3DAYS TD PT72
1.0000 | MEDICATED_PATCH | Freq: Once | TRANSDERMAL | Status: DC
  Administered 2017-09-14: 1.5 mg via TRANSDERMAL

## 2017-09-14 MED ORDER — MIDAZOLAM HCL 2 MG/2ML IJ SOLN
INTRAMUSCULAR | Status: DC | PRN
  Administered 2017-09-14: 2 mg via INTRAVENOUS

## 2017-09-14 MED ORDER — LIDOCAINE HCL (CARDIAC) 20 MG/ML IV SOLN
INTRAVENOUS | Status: DC | PRN
  Administered 2017-09-14: 60 mg via INTRAVENOUS

## 2017-09-14 MED ORDER — ONDANSETRON HCL 4 MG/2ML IJ SOLN
INTRAMUSCULAR | Status: DC | PRN
  Administered 2017-09-14: 4 mg via INTRAVENOUS

## 2017-09-14 MED ORDER — FENTANYL CITRATE (PF) 250 MCG/5ML IJ SOLN
INTRAMUSCULAR | Status: AC
  Filled 2017-09-14: qty 5

## 2017-09-14 MED ORDER — KETOROLAC TROMETHAMINE 30 MG/ML IJ SOLN
INTRAMUSCULAR | Status: DC | PRN
  Administered 2017-09-14: 30 mg via INTRAVENOUS

## 2017-09-14 MED ORDER — CEFAZOLIN SODIUM-DEXTROSE 2-4 GM/100ML-% IV SOLN
INTRAVENOUS | Status: AC
  Filled 2017-09-14: qty 100

## 2017-09-14 SURGICAL SUPPLY — 17 items
BIPOLAR CUTTING LOOP 21FR (ELECTRODE)
BOOTIES KNEE HIGH SLOAN (MISCELLANEOUS) ×6 IMPLANT
CANISTER SUCT 3000ML PPV (MISCELLANEOUS) ×3 IMPLANT
CATH ROBINSON RED A/P 16FR (CATHETERS) ×3 IMPLANT
ELECT REM PT RETURN 9FT ADLT (ELECTROSURGICAL) ×3
ELECTRODE REM PT RTRN 9FT ADLT (ELECTROSURGICAL) ×1 IMPLANT
GLOVE BIOGEL PI IND STRL 7.0 (GLOVE) ×2 IMPLANT
GLOVE BIOGEL PI INDICATOR 7.0 (GLOVE) ×4
GOWN STRL REUS W/TWL LRG LVL3 (GOWN DISPOSABLE) ×6 IMPLANT
LOOP CUTTING BIPOLAR 21FR (ELECTRODE) IMPLANT
PACK VAGINAL MINOR WOMEN LF (CUSTOM PROCEDURE TRAY) ×3 IMPLANT
PAD OB MATERNITY 4.3X12.25 (PERSONAL CARE ITEMS) ×3 IMPLANT
SUT VIC AB 2-0 CT1 27 (SUTURE) ×2
SUT VIC AB 2-0 CT1 TAPERPNT 27 (SUTURE) ×1 IMPLANT
TOWEL OR 17X24 6PK STRL BLUE (TOWEL DISPOSABLE) ×3 IMPLANT
TUBING AQUILEX INFLOW (TUBING) ×3 IMPLANT
TUBING AQUILEX OUTFLOW (TUBING) ×3 IMPLANT

## 2017-09-14 NOTE — Interval H&P Note (Signed)
History and Physical Interval Note:  09/14/2017 12:42 PM  Debbie [name]  has presented today for surgery, with the diagnosis of Dysfunctional Uterine bleeding  The various methods of treatment have been discussed with the patient and family. After consideration of risks, benefits and other options for treatment, the patient has consented to  Procedure(s): [place] (N/A) as a surgical intervention .  The patient's history has been reviewed, patient examined, no change in status, stable for surgery.  I have reviewed the patient's chart and labs.  Questions were answered to the patient's satisfaction.     Katharine Look A Jayvyn Haselton

## 2017-09-14 NOTE — Op Note (Signed)
Preop diagnosis: Post-menopausal bleeding, metastatic breast cancer  Postop diagnosis: same  Anesthesia: IV sedation  Anesthesiologist: Dr. Smith [name]  Procedure: Hysteroscopy, dilatation and curettage  Surgeon: Dr. Katharine Look Zelma [name]  Procedure: After being informed of the planned procedure with possible complications including bleeding, infection and uterine perforation, informed consent was obtained and patient was taken to or #4.  She was given IV sedation without complication. She was placed in a lithotomy position, prepped and draped in the sterile fashion and her bladder was emptied with a red rubber cathter. Pelvic exam reveals anteverted normal size uterus with 2 normal adnexa.  A weighted speculum is inserted in the vagina. The cervix was grasped with a tenaculum forcep placed on the anterior lip.We proceed with a paracervical block using 1% Nesacaine, 10 cc. Uterus is sounded at 10. The cervix is then easily dilated using Hegar dilator until # 27. This allows for easy placement of a diagnostic hysteroscope. With perfusion of NS at a maximum pressure of 90 mmHg, we are able to evaluate the entire uterine cavity.  Observation: Both tubal ostia are seen and normal although there is diffuse adhesions within the endometrial cavity from a previous endometrial abletion including a thick midline band estimated at 2 cm. The endometrium otherwise appears atrophic.  Using a sharp curette, we proceed with curettage of the endometrial cavity which returns a small amount of normal-appearing endometrium.  Instruments are then removed. Instrument and sponge count is complete x2. Estimated blood loss is minimal. Water deficit is 170 cc of NS.  The procedure is very well tolerated by the patient who is taken to recovery room in a well and stable condition.  Specimen: Endometrial curettings sent to pathology.

## 2017-09-14 NOTE — Anesthesia Procedure Notes (Signed)
Procedure Name: LMA Insertion Date/Time: 09/14/2017 1:08 PM Performed by: Jonna Munro, CRNA Pre-anesthesia Checklist: Patient identified, Emergency Drugs available, Suction available, Patient being monitored and Timeout performed Patient Re-evaluated:Patient Re-evaluated prior to induction Oxygen Delivery Method: Circle system utilized Preoxygenation: Pre-oxygenation with 100% oxygen Induction Type: IV induction LMA: LMA inserted LMA Size: 4.0 Number of attempts: 1 Placement Confirmation: positive ETCO2 and breath sounds checked- equal and bilateral Tube secured with: Tape Dental Injury: Teeth and Oropharynx as per pre-operative assessment

## 2017-09-14 NOTE — Transfer of Care (Signed)
Immediate Anesthesia Transfer of Care Note  Patient: TENNILLE MONTELONGO  Procedure(s) Performed: DILATATION & CURETTAGE/HYSTEROSCOPY WITH RESECTOCOPE (N/A )  Patient Location: PACU  Anesthesia Type:General  Level of Consciousness: awake, alert  and oriented  Airway & Oxygen Therapy: Patient Spontanous Breathing and Patient connected to nasal cannula oxygen  Post-op Assessment: Report given to RN and Post -op Vital signs reviewed and stable  Post vital signs: Reviewed and stable  Last Vitals:  Vitals Value Taken Time  BP 127/75 09/14/2017  1:40 PM  Temp    Pulse 73 09/14/2017  1:44 PM  Resp 15 09/14/2017  1:44 PM  SpO2 95 % 09/14/2017  1:44 PM  Vitals shown include unvalidated device data.  Last Pain:  Vitals:   09/14/17 1156  TempSrc: Oral      Patients Stated Pain Goal: 3 (97/[id number]/63 [id number])  Complications: No apparent anesthesia complications

## 2017-09-14 NOTE — Discharge Instructions (Signed)
POST-OPERATIVE INSTRUCTIONS TO PATIENT  Call [facility]  [phone])  for excessive pain, bleeding or temperature greater than or equal to 100.4 degrees (orally).    No driving for 24 hours No sexual activity for 1 week No tampon for 48 hours  Pain management:  Use Ibuprofen 600 mg every 6 hours for 5 days and then as needed. Use your pain medication as needed to maintain a pain level at or below 3/10 Use Colace 1-2 capsules per day as long as you are using pain medication to avoid constipation.       Diet: normal  Bathing: may shower day after surgery  Return to Dr. Cletis Media on as scheduled  Return to work: To be determined at post operative visit.    Dede Query Rivard MD 09/14/2017 1:43 PM    Post Anesthesia Home Care Instructions  Activity: Get plenty of rest for the remainder of the day. A responsible individual must stay with you for 24 hours following the procedure.  For the next 24 hours, DO NOT: -Drive a car -Paediatric nurse -Drink alcoholic beverages -Take any medication unless instructed by your physician -Make any legal decisions or sign important papers.  Meals: Start with liquid foods such as gelatin or soup. Progress to regular foods as tolerated. Avoid greasy, spicy, heavy foods. If nausea and/or vomiting occur, drink only clear liquids until the nausea and/or vomiting subsides. Call your physician if vomiting continues.  Special Instructions/Symptoms: Your throat may feel dry or sore from the anesthesia or the breathing tube placed in your throat during surgery. If this causes discomfort, gargle with warm salt water. The discomfort should disappear within 24 hours.  If you had a scopolamine patch placed behind your ear for the management of post- operative nausea and/or vomiting:  1. The medication in the patch is effective for 72 hours, after which it should be removed.  Wrap patch in a tissue and discard in the trash. Wash hands thoroughly  with soap and water. 2. You may remove the patch earlier than 72 hours if you experience unpleasant side effects which may include dry mouth, dizziness or visual disturbances. 3. Avoid touching the patch. Wash your hands with soap and water after contact with the patch.   DISCHARGE INSTRUCTIONS: HYSTEROSCOPY / ENDOMETRIAL ABLATION The following instructions have been prepared to help you care for yourself upon your return home.  May Remove Scop patch on or before Friday  May take Ibuprofen after   May take stool softner while taking narcotic pain medication to prevent constipation.  Drink plenty of water.  Personal hygiene:  Use sanitary pads for vaginal drainage, not tampons.  Shower the day after your procedure.  NO tub baths, pools or Jacuzzis for 2-3 weeks.  Wipe front to back after using the bathroom.  Activity and limitations:  Do NOT drive or operate any equipment for 24 hours. The effects of anesthesia are still present and drowsiness may result.  Do NOT rest in bed all day.  Walking is encouraged.  Walk up and down stairs slowly.  You may resume your normal activity in one to two days or as indicated by your physician. Sexual activity: NO intercourse for at least 2 weeks after the procedure, or as indicated by your Doctor.  Diet: Eat a light meal as desired this evening. You may resume your usual diet tomorrow.  Return to Work: You may resume your work activities in one to two days or as indicated by Marine scientist.  What to expect after your surgery: Expect to have vaginal bleeding/discharge for 2-3 days and spotting for up to 10 days. It is not unusual to have soreness for up to 1-2 weeks. You may have a slight burning sensation when you urinate for the first day. Mild cramps may continue for a couple of days. You may have a regular period in 2-6 weeks.  Call your doctor for any of the following:  Excessive vaginal bleeding or clotting, saturating and  changing one pad every hour.  Inability to urinate 6 hours after discharge from hospital.  Pain not relieved by pain medication.  Fever of 100.4 F or greater.  Unusual vaginal discharge or odor.

## 2017-09-14 NOTE — Anesthesia Preprocedure Evaluation (Addendum)
Anesthesia Evaluation  Patient identified by MRN, date of birth, ID band Patient awake    Reviewed: Allergy & Precautions, NPO status , Patient's Chart, lab work & pertinent test results  Airway Mallampati: II  TM Distance: >3 FB Neck ROM: Full    Dental  (+) Teeth Intact, Dental Advisory Given   Pulmonary neg pulmonary ROS,    Pulmonary exam normal        Cardiovascular  Rhythm:Regular Rate:Normal     Neuro/Psych  Headaches, Anxiety    GI/Hepatic Neg liver ROS, GERD  Medicated,  Endo/Other  negative endocrine ROS  Renal/GU negative Renal ROS     Musculoskeletal   Abdominal   Peds  Hematology negative hematology ROS (+)   Anesthesia Other Findings   Reproductive/Obstetrics (+) Pregnancy                            Lab Results  Component Value Date   WBC 3.9 09/13/2017   HGB 12.8 09/13/2017   HCT 37.1 09/13/2017   MCV 90.7 09/13/2017   PLT 271 09/13/2017   Lab Results  Component Value Date   CREATININE 0.72 09/13/2017   BUN 7 09/13/2017   NA 139 09/13/2017   K 3.9 09/13/2017   CL 107 09/13/2017   CO2 23 09/13/2017   Lab Results  Component Value Date   INR 1.01 04/06/2017     Anesthesia Physical Anesthesia Plan  ASA: II  Anesthesia Plan: General   Post-op Pain Management:    Induction: Intravenous  PONV Risk Score and Plan: 4 or greater and Ondansetron, Dexamethasone, Midazolam and Scopolamine patch - Pre-op  Airway Management Planned: LMA  Additional Equipment:   Intra-op Plan:   Post-operative Plan: Extubation in OR  Informed Consent: I have reviewed the patients History and Physical, chart, labs and discussed the procedure including the risks, benefits and alternatives for the proposed anesthesia with the patient or authorized representative who has indicated his/her understanding and acceptance.   Dental advisory given  Plan Discussed with:  CRNA  Anesthesia Plan Comments:       Anesthesia Quick Evaluation

## 2017-09-15 ENCOUNTER — Encounter (HOSPITAL_COMMUNITY): Payer: Self-pay | Admitting: Obstetrics and Gynecology

## 2017-09-15 NOTE — Anesthesia Postprocedure Evaluation (Signed)
Anesthesia Post Note  Patient: Debbie [name]  Procedure(s) Performed: DILATATION & CURETTAGE/HYSTEROSCOPY WITH RESECTOCOPE (N/A )     Patient location during evaluation: PACU Anesthesia Type: General Level of consciousness: awake and alert Pain management: pain level controlled Vital Signs Assessment: post-procedure vital signs reviewed and stable Respiratory status: spontaneous breathing, nonlabored ventilation, respiratory function stable and patient connected to nasal cannula oxygen Cardiovascular status: blood pressure returned to baseline and stable Postop Assessment: no apparent nausea or vomiting Anesthetic complications: no    Last Vitals:  Vitals:   09/14/17 1445 09/14/17 1525  BP: 104/60 132/73  Pulse: 72 83  Resp: 14 16  Temp:    SpO2: 94% 97%    Last Pain:  Vitals:   09/14/17 1445  TempSrc:   PainSc: 0-No pain                 Effie Berkshire

## 2017-09-23 ENCOUNTER — Ambulatory Visit (HOSPITAL_COMMUNITY)
Admission: RE | Admit: 2017-09-23 | Discharge: 2017-09-23 | Disposition: A | Discharge status: Home / Self Care | Payer: BLUE CROSS/BLUE SHIELD | Source: Ambulatory Visit | Attending: Oncology | Admitting: Oncology

## 2017-09-23 DIAGNOSIS — C7800 Secondary malignant neoplasm of unspecified lung: Secondary | ICD-10-CM | POA: Diagnosis not present

## 2017-09-23 DIAGNOSIS — C50312 Malignant neoplasm of lower-inner quadrant of left female breast: Secondary | ICD-10-CM | POA: Insufficient documentation

## 2017-09-23 DIAGNOSIS — Z170 Estrogen receptor positive status [ER+]: Secondary | ICD-10-CM | POA: Diagnosis not present

## 2017-09-23 DIAGNOSIS — C50912 Malignant neoplasm of unspecified site of left female breast: Secondary | ICD-10-CM | POA: Diagnosis not present

## 2017-09-23 DIAGNOSIS — C7802 Secondary malignant neoplasm of left lung: Secondary | ICD-10-CM | POA: Diagnosis not present

## 2017-09-23 DIAGNOSIS — K760 Fatty (change of) liver, not elsewhere classified: Secondary | ICD-10-CM | POA: Insufficient documentation

## 2017-09-23 MED ORDER — IOHEXOL 300 MG/ML  SOLN
75.0000 mL | Freq: Once | INTRAMUSCULAR | Status: AC | PRN
  Administered 2017-09-23: 75 mL via INTRAVENOUS

## 2017-09-24 ENCOUNTER — Telehealth: Payer: Self-pay

## 2017-09-24 NOTE — Telephone Encounter (Signed)
Per Dr Jana Hakim, I called pt back with CT chest results per him, " Looks stable to improved, good news!" Pt voiced understanding and I reminded her, she has appt with Dr Jana Hakim on 10/05/2017.

## 2017-09-24 NOTE — Telephone Encounter (Signed)
Pt left VM regarding she would like her CT chest results. I printed results for Dr Jana Hakim to review.  I called pt back and let her know a nurse or Dr Jana Hakim will call her with results once reviewed. No other needs per pt at this time.

## 2017-09-30 DIAGNOSIS — Z09 Encounter for follow-up examination after completed treatment for conditions other than malignant neoplasm: Secondary | ICD-10-CM | POA: Diagnosis not present

## 2017-09-30 DIAGNOSIS — L293 Anogenital pruritus, unspecified: Secondary | ICD-10-CM | POA: Diagnosis not present

## 2017-10-04 NOTE — Progress Notes (Signed)
[place]  [phone] [phone]) [phone]     ID: Debbie [name] OB: 08-May-1966  MR#: [id number]  JJO#:[id number]  PCP: Harlan Stains, MD GYN:  [name]: [name]; [name] OTHER MD: Cline Cools, [place]  CHIEF COMPLAINT: locally advanced estrogen receptor positive breast cancer  CURRENT TREATMENT:  Anastrozole, palbociclib  INTERVAL HISTORY: Debbie [name] returns today for follow-up and treatment of her estrogen receptor positive metastatic breast cancer accompanied by her husband. She continues on anastrozole, with good tolerance. She denies issues with hot flashes. She has some vaginal dryness.   She also receives palbociclib, currently at 125 mg, 3 weeks on and 1 weeks off. Today is day 1 of her off week. She tolerates this well. She denies any complications from this.   Since her last visit, she completed a Chest CT on 09/23/2017 showing: Stable to slight decrease in the left hemithoracic nodular pleural disease. No new or definite progressive disease today. Hepatic steatosis.  Since her last visit here Debbie [name] met with Dr. Winn Jock at [facility] who suggested continuing current treatment until progression, then referral back to her for clinical trial consideration.   REVIEW OF SYSTEMS: Debbie [name] reports that she is doing well. She feels that her anxiety has been elevated because she is worried about her cancer spreading. She tries to get out of her head. She prays and tries to have peace about it. She also finds comfort in talking to her husband her a friend. She doesn't like to exercise, but she and her husband try to take occasional walk. She is interested in taking Thai-Chi classes. She has heart burn and GERD. She was prescribed prilosec. She has some occasional sinus issues that can trigger a migraine. She also has occasional diarrhea, and she aids this with Imodium. She denies unusual headaches, visual  changes, nausea, vomiting, or dizziness. There has been no unusual cough, phlegm production, or pleurisy. This been no change in bowel or bladder habits. She denies unexplained fatigue or unexplained weight loss, bleeding, rash, or fever. A detailed review of systems was otherwise stable.    BREAST CANCER HISTORY: As per previously documented note:  Debbie [name] had routine yearly screening mammography at the breast center 06/19/2013 showing a possible mass in the left breast. The right breast was unremarkable. On 06/30/2013 additional views of the left breast showed an irregular spiculated dense mass measuring 2.2 cm in the lower inner quadrant. This was palpable at the 8:00 location. Ultrasound showed an irregular hypoechoic mass measuring 2.3 cm and in the left axilla a dominant abnormal appearing lymph node with cortical thickening measuring 1.2 cm.  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  Subsequently the patient underwent bilateral breast MRI. This showed her breasts to be dense (composition C.). In the left breast at the 8:00 position there was a 2.4 cm enhancing mass containing a biopsy clip. There were no additional areas of concern. In the axilla on the left there was a 2.6 cm enlarged left axillary lymph node. There was also a 0.5 cm left internal mammary lymph nodes noted. The right side was  unremarkable.  The patient's subsequent history is as detailed below.  PAST MEDICAL HISTORY: Past Medical History:  Diagnosis Date  . Abnormal Pap smear   . Anxiety   . Breast cancer ([place]) 2015   ER+/PR+/Her2- Left - Dr. Earlie Lou  . GERD (gastroesophageal reflux disease)    occasional  . Hyperlipidemia    no meds   . IBS (irritable bowel syndrome)   . IBS (irritable bowel syndrome)   . Menorrhagia   . Migraines    menstral  . Migraines   . MVP (mitral valve prolapse)    echo 2/15-mitral valve normal-no regurg,no stenosis  . Pre-diabetes    no meds  . Radiation 02/14/14-04/03/14   Left Breast  . Seasonal allergies   . SVD (spontaneous vaginal delivery)    x 3    PAST SURGICAL HISTORY: Past Surgical History:  Procedure Laterality Date  . BREAST SURGERY Left 2015   lumpectomy x 2 - Dr. Jana Hakim  . COLONOSCOPY    . DILATATION & CURRETTAGE/HYSTEROSCOPY WITH RESECTOCOPE N/A 09/14/2017   Procedure: DILATATION & CURETTAGE/HYSTEROSCOPY WITH RESECTOCOPE;  Surgeon: [name], MD;  Location: [facility];  Service: Gynecology;  Laterality: N/A;  . ENDOMETRIAL ABLATION  2013  . PORT-A-CATH REMOVAL N/A 05/15/2014   Procedure: REMOVAL PORT-A-CATH;  Surgeon: [name], MD;  Location: [facility];  Service: General;  Laterality: N/A;  . PORTACATH PLACEMENT Left 07/18/2013   Procedure: INSERTION PORT-A-CATH;  Surgeon: [name], MD;  Location: [facility];  Service: General;  Laterality: Left;  . SVD      x 3  . VULVA /PERINEUM BIOPSY  2011  . WISDOM TEETH REMOVAL      FAMILY HISTORY Family History  Problem Relation Age of Onset  . Cancer Maternal Grandmother        BREAST AND UTERINE CANCER  . Uterine cancer Maternal Grandmother 84  . Hyperlipidemia Mother   . Hypertension Mother   . Multiple myeloma Father 46  . Lung cancer Maternal Manufacturing engineer  . Testicular cancer Other 75  The patient's father is living, currently age 56. He has a history of multiple myeloma. The patient's mother is 27.  She was diagnosed with ductal carcinoma in situ in 2016.The patient's parents live in [place] The patient had no brothers, 2 sisters. There is no history of breast or ovarian  cancer in the family. She believes one of her grandfathers died from lung cancer and another from pancreatic cancer, but is not sure  GYNECOLOGIC HISTORY:  Menarche age 26, first live birth age 52, which the patient understands is an independent risk factor for breast cancer. She is GX P3. She was still having regular periods at the start of chemotherapy. LMP was March 2015. She tells me Dr. Cletis Media checked her labs December 2015 and found that she was in the menopausal range. 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 Her husband Debbie [name] is a Energy manager at [employer]. Her 3 children are Debbie [name] (16), Debbie [name] (12) and Debbie [name] (9). The patient attends a local [place]: Not in place   HEALTH MAINTENANCE: Social History   Tobacco Use  . Smoking status: Never Smoker  . Smokeless tobacco: Never Used  Substance Use Topics  . Alcohol use: No  . Drug use: No  Colonoscopy: January 2015  YKD:XIPJASNK 2014  Bone density:  Lipid panel:  Allergies  Allergen Reactions  . Dust Mite Extract Itching    Current Outpatient Medications  Medication Sig Dispense Refill  . ALPRAZolam (XANAX) 0.5 MG tablet Take 20 minutes before MRI; may repeat once 5 tablet 0  . anastrozole (ARIMIDEX) 1 MG tablet Take 1 tablet (1 mg total) by mouth daily. (Patient taking differently: Take 1 mg by mouth at bedtime. ) 90 tablet 4  . cetirizine (ZYRTEC) 10 MG tablet Take 10 mg by mouth daily.    . Cholecalciferol (VITAMIN D3) 2000 units TABS Take by mouth daily.     . frovatriptan (FROVA) 2.5 MG tablet Take 1 tablet (2.5 mg total) by mouth as needed for migraine. If recurs, may repeat after 2 hours. Max of 3 tabs in 24 hours. 10 tablet 0  . mometasone (NASONEX) 50 MCG/ACT nasal spray Place 2 sprays into the nose daily as needed (for allergies).     Marland Kitchen omeprazole  (PRILOSEC) 20 MG capsule Take 1 capsule (20 mg total) by mouth 2 (two) times daily before a meal. (Patient taking differently: Take 20 mg daily as needed by mouth. ) 60 capsule 3  . oxymetazoline (AFRIN) 0.05 % nasal spray Place 2 sprays into the nose daily as needed. congestion    . palbociclib (IBRANCE) 125 MG capsule Take 1 capsule by mouth once daily with food. Take for 3 weeks on, 1 week off, repeat every 4 weeks (Patient taking differently: at bedtime. Take 1 capsule by mouth once daily with food. Take for 3 weeks on, 1 week off, repeat every 4 weeks) 21 capsule 6  . PARoxetine (PAXIL) 20 MG tablet Take 20 mg by mouth every morning.     No current facility-administered medications for this visit.     OBJECTIVE: Middle-aged white woman in no acute distress  Vitals:   10/05/17 1543  BP: 113/66  Pulse: 86  Resp: 18  Temp: 98 F (36.7 C)  SpO2: 96%     Body mass index is 36.82 kg/m.    ECOG FS:0 - Asymptomatic  Sclerae unicteric, EOMs intact Oropharynx clear and moist No cervical or supraclavicular adenopathy Lungs no rales or rhonchi, no rubs Heart regular rate and rhythm Abd soft, nontender, positive bowel sounds MSK no focal spinal tenderness, no upper extremity lymphedema Neuro: nonfocal, well oriented, appropriate affect Breasts: The right breast is benign.  The left breast is status post lumpectomy and radiation, but there is no evidence of recurrence.  Both axillae are benign nign.      Component Value Date/Time   NA 139 09/13/2017 1248   NA 139 04/28/2017 1326   K 3.9 09/13/2017 1248   K 4.1 04/28/2017 1326   CL 107 09/13/2017 1248   CO2 23 09/13/2017 1248   CO2 20 (L) 04/28/2017 1326   GLUCOSE 115 09/13/2017 1248   GLUCOSE 152 (H) 04/28/2017 1326   BUN 7 09/13/2017 1248   BUN 12.7 04/28/2017 1326   CREATININE 0.72 09/13/2017 1248   CREATININE 0.8 04/28/2017 1326   CALCIUM 9.6 09/13/2017 1248   CALCIUM 9.0 04/28/2017 1326   PROT 7.2 09/13/2017 1248   PROT  7.3 04/28/2017 1326   ALBUMIN 4.1 09/13/2017 1248   ALBUMIN 4.1 04/28/2017 1326   AST 32 09/13/2017 1248   AST 23 04/28/2017 1326   ALT 58 (H) 09/13/2017 1248   ALT 37 04/28/2017 1326   ALKPHOS 94 09/13/2017 1248   ALKPHOS 94 04/28/2017  1326   BILITOT 0.3 09/13/2017 1248   BILITOT 0.42 04/28/2017 1326   GFRNONAA >60 09/13/2017 1248   GFRAA >60 09/13/2017 1248    I No results found for: SPEP  Lab Results  Component Value Date   WBC 3.0 (L) 10/05/2017   NEUTROABS 1.5 10/05/2017   HGB 13.1 10/05/2017   HCT 37.7 10/05/2017   MCV 90.8 10/05/2017   PLT 238 10/05/2017      Chemistry      Component Value Date/Time   NA 139 09/13/2017 1248   NA 139 04/28/2017 1326   K 3.9 09/13/2017 1248   K 4.1 04/28/2017 1326   CL 107 09/13/2017 1248   CO2 23 09/13/2017 1248   CO2 20 (L) 04/28/2017 1326   BUN 7 09/13/2017 1248   BUN 12.7 04/28/2017 1326   CREATININE 0.72 09/13/2017 1248   CREATININE 0.8 04/28/2017 1326      Component Value Date/Time   CALCIUM 9.6 09/13/2017 1248   CALCIUM 9.0 04/28/2017 1326   ALKPHOS 94 09/13/2017 1248   ALKPHOS 94 04/28/2017 1326   AST 32 09/13/2017 1248   AST 23 04/28/2017 1326   ALT 58 (H) 09/13/2017 1248   ALT 37 04/28/2017 1326   BILITOT 0.3 09/13/2017 1248   BILITOT 0.42 04/28/2017 1326       No results found for: LABCA2  No components found for: LABCA125  No results for input(s): INR in the last 168 hours.  Urinalysis    Component Value Date/Time   BILIRUBINUR Negative 09/22/2011 1451   PROTEINUR Trace 09/22/2011 1451   UROBILINOGEN negative 09/22/2011 1451   NITRITE Negative 09/22/2011 1451   LEUKOCYTESUR Trace 09/22/2011 1451   CA-27-29 has normalized  STUDIES: Ct Chest W Contrast  Result Date: 09/24/2017 CLINICAL DATA:  Left-sided breast cancer with lung mets. EXAM: CT CHEST WITH CONTRAST TECHNIQUE: Multidetector CT imaging of the chest was performed during intravenous contrast administration. CONTRAST:  89m  OMNIPAQUE IOHEXOL 300 MG/ML  SOLN COMPARISON:  07/07/2017 FINDINGS: Cardiovascular: The heart size is normal. No pericardial effusion. No thoracic aortic aneurysm. Mediastinum/Nodes: No mediastinal lymphadenopathy. There is no hilar lymphadenopathy. Surgical clips noted left axilla. There is no axillary lymphadenopathy. The esophagus has normal imaging features. Lungs/Pleura: Nodular pleural disease in the left upper hemithorax is again noted. Index anterolateral lesion measured previously at 2.1 x 3.1 cm is 2.2 x 3.2 cm when measuring in a similar fashion on today's exam. More posterior lesion measured previously at 5.4 x 2.2 cm measures slightly smaller today at 5.0 x 1.5 cm. The third index lesion along the pleura of the left heart border is not measurable today. Other areas of left-sided pleural nodularity are similar. Subpleural reticulation in the anterior left upper lobe is similar and may be related to radiation. No pulmonary parenchymal nodule or mass. Nodularity along the left major fissure and inferior pulmonary ligament is stable. Upper Abdomen: The liver shows diffusely decreased attenuation suggesting steatosis. Otherwise unremarkable. Musculoskeletal: Bone windows reveal no worrisome lytic or sclerotic osseous lesions. IMPRESSION: 1. Stable to slight decrease in the left hemithoracic nodular pleural disease. 2. No new or definite progressive disease today. 3. Hepatic steatosis. Electronically Signed   By: EMisty StanleyM.D.   On: 09/24/2017 12:21      ASSESSMENT: 52y.o. I[place] woman status post left breast lower inner quadrant and left axillary lymph node biopsy 06/30/2013 for a clinical T2 N1-2, stage IIB/IIIA invasive ductal carcinoma, grade 2, estrogen and progesterone receptors both strongly positive, HER-2  nonamplified, with an MIB-1 of 36%.  (1) there is a 5 mm left internal mammary lymph node which was negative on PET scan  (2) cyclophosphamide and doxorubicin started 08/04/2014,  given in dose dense fashion x4 with Neulasta day 2; completed 09/14/2013, followed by paclitaxel again given in dose dense fashion x4, completed 11/09/2013  (3) left lumpectomy and axillary lymph node dissection 12/13/2013 at [facility] showed a residual pT2 pN1 (mic), stage IIB invasive ductal carcinoma; margins positive for DCIS cleared with subsequent surgery (01/03/2014)  (4) adjuvant radiation therapy completed 04/03/2014  (5) tamoxifen started December 2015, discontinued October 2018  (6) hepatic steatosis: documented on abdomianl US 08/07/2014, with elevated but stable LFTs,  (7) genetic testing 07/24/2013 through the  "Women's Hereditary Panel", performed at [facility], found no deleterious mutations in  ATM, BRCA1, BRCA2, BRIP1, CDH1, CHEK2 (c.1100delC only), EPCAM, MLH1, MSH2, MSH6, NBN, PALB2, PMS2, PTEN, RAD51C, STK11, and TP53.  (8) METASTATIC DISEASE: October 2018 -- chest CT scan 03/17/2017 shows left pleural thickening and a left pleural effusion; PET scan 03/24/2017 shows no liver or bone involvement and no involvement of the right lung.  (a) biopsy of the left pleural mass 04/06/2017 confirms metastatic breast cancer, estrogen and progesterone receptor positive, HER-2 negative  (b) CA 27-27 was 86.3 on 03/30/2017  (9) anastrozole started October 2018  (a) palbociclib added 04/12/2017   PLAN: Debbie [name] is now a little over half a year out from definitive diagnosis of metastatic breast cancer.  She is having a very good response to anastrozole/palbociclib and she is tolerating it well.  She wanted to know why we did not get more of a response.  We reviewed the fact that her cancer is not curable, and that the goal here is control.  If the cancer does not grow we are doing well.  If it shrinks were even happier.  That is the current situation.  She is very anxious.  We discussed ways of dealing with anxiety which includes first of all doing everything that needs to be  done to try to prevent the reason she is anxious, namely the will fall out of the cancer spreading further; she is already doing that.  In addition to that she can do prior, meditation, breathing exercises, yoga which is available here at 6 PM on Wednesdays, and in particular exercising to the point of sweating.  She is unfortunately not exercising at all right now.  I strongly recommended that she is start walking and get an exercise bike so she can safely exercise to the point of sweat  She is interested in dieting.  I suggest that she stop carbohydrates altogether and concentrate on proteins and vegetables.  She is going to have lab work on a monthly basis so we can adjust the palbociclib as needed.  I am going to see her in 66month.  4 months from now we will consider further restaging studies and sometime around the end of the year she should have a brain MRI and a PET scan  She knows to call for any issues that may develop before the next visit.  MChauncey Cruel MD 10/05/17  4:03 PM Medical Oncology and Hematology [facility] [address]Tel. [phone]. [phone] This document serves as a record of services personally performed by GLurline Del MD. It was created on his behalf by ASheron Nightingale a trained medical scribe. The creation of this record is based on the scribe's  personal observations and the provider's statements to them.   I have reviewed the above documentation for accuracy and completeness, and I agree with the above.

## 2017-10-05 ENCOUNTER — Inpatient Hospital Stay (HOSPITAL_BASED_OUTPATIENT_CLINIC_OR_DEPARTMENT_OTHER): Payer: BLUE CROSS/BLUE SHIELD | Admitting: Oncology

## 2017-10-05 ENCOUNTER — Inpatient Hospital Stay: Payer: BLUE CROSS/BLUE SHIELD | Attending: Oncology

## 2017-10-05 VITALS — BP 113/66 | HR 86 | Temp 98.0°F | Resp 18 | Ht 64.0 in | Wt 214.5 lb

## 2017-10-05 DIAGNOSIS — F419 Anxiety disorder, unspecified: Secondary | ICD-10-CM | POA: Insufficient documentation

## 2017-10-05 DIAGNOSIS — K760 Fatty (change of) liver, not elsewhere classified: Secondary | ICD-10-CM

## 2017-10-05 DIAGNOSIS — K589 Irritable bowel syndrome without diarrhea: Secondary | ICD-10-CM | POA: Insufficient documentation

## 2017-10-05 DIAGNOSIS — C50312 Malignant neoplasm of lower-inner quadrant of left female breast: Secondary | ICD-10-CM | POA: Insufficient documentation

## 2017-10-05 DIAGNOSIS — Z170 Estrogen receptor positive status [ER+]: Secondary | ICD-10-CM

## 2017-10-05 DIAGNOSIS — R12 Heartburn: Secondary | ICD-10-CM | POA: Diagnosis not present

## 2017-10-05 DIAGNOSIS — E785 Hyperlipidemia, unspecified: Secondary | ICD-10-CM | POA: Diagnosis not present

## 2017-10-05 DIAGNOSIS — Z923 Personal history of irradiation: Secondary | ICD-10-CM | POA: Diagnosis not present

## 2017-10-05 DIAGNOSIS — C7800 Secondary malignant neoplasm of unspecified lung: Secondary | ICD-10-CM

## 2017-10-05 DIAGNOSIS — R51 Headache: Secondary | ICD-10-CM | POA: Diagnosis not present

## 2017-10-05 DIAGNOSIS — I341 Nonrheumatic mitral (valve) prolapse: Secondary | ICD-10-CM | POA: Insufficient documentation

## 2017-10-05 DIAGNOSIS — R197 Diarrhea, unspecified: Secondary | ICD-10-CM | POA: Diagnosis not present

## 2017-10-05 DIAGNOSIS — K219 Gastro-esophageal reflux disease without esophagitis: Secondary | ICD-10-CM | POA: Diagnosis not present

## 2017-10-05 DIAGNOSIS — Z79811 Long term (current) use of aromatase inhibitors: Secondary | ICD-10-CM | POA: Diagnosis not present

## 2017-10-05 LAB — CBC WITH DIFFERENTIAL/PLATELET
Basophils Absolute: 0 10*3/uL (ref 0.0–0.1)
Basophils Relative: 0 %
Eosinophils Absolute: 0.1 10*3/uL (ref 0.0–0.5)
Eosinophils Relative: 2 %
HCT: 37.7 % (ref 34.8–46.6)
Hemoglobin: 13.1 g/dL (ref 11.6–15.9)
Lymphocytes Relative: 39 %
Lymphs Abs: 1.2 10*3/uL (ref 0.9–3.3)
MCH: 31.6 pg (ref 25.1–34.0)
MCHC: 34.7 g/dL (ref 31.5–36.0)
MCV: 90.8 fL (ref 79.5–101.0)
Monocytes Absolute: 0.3 10*3/uL (ref 0.1–0.9)
Monocytes Relative: 10 %
Neutro Abs: 1.5 10*3/uL (ref 1.5–6.5)
Neutrophils Relative %: 49 %
Platelets: 238 10*3/uL (ref 145–400)
RBC: 4.15 MIL/uL (ref 3.70–5.45)
RDW: 14.7 % — ABNORMAL HIGH (ref 11.2–14.5)
WBC: 3 10*3/uL — ABNORMAL LOW (ref 3.9–10.3)

## 2017-10-05 LAB — COMPREHENSIVE METABOLIC PANEL
ALT: 62 U/L — ABNORMAL HIGH (ref 0–55)
AST: 40 U/L — ABNORMAL HIGH (ref 5–34)
Albumin: 4.3 g/dL (ref 3.5–5.0)
Alkaline Phosphatase: 90 U/L (ref 40–150)
Anion gap: 8 (ref 3–11)
BUN: 9 mg/dL (ref 7–26)
CO2: 28 mmol/L (ref 22–29)
Calcium: 10.2 mg/dL (ref 8.4–10.4)
Chloride: 104 mmol/L (ref 98–109)
Creatinine, Ser: 0.8 mg/dL (ref 0.60–1.10)
GFR calc Af Amer: >60 mL/min (ref >60)
GFR calc non Af Amer: >60 mL/min (ref >60)
Glucose, Bld: 166 mg/dL — ABNORMAL HIGH (ref 70–140)
Potassium: 3.9 mmol/L (ref 3.5–5.1)
Sodium: 140 mmol/L (ref 136–145)
Total Bilirubin: 0.3 mg/dL (ref 0.2–1.2)
Total Protein: 7.4 g/dL (ref 6.4–8.3)

## 2017-10-05 MED ORDER — OMEPRAZOLE 40 MG PO CPDR: 40.0000 mg | DELAYED_RELEASE_CAPSULE | Freq: Every day | ORAL | 4 refills | Status: DC

## 2017-10-06 ENCOUNTER — Telehealth: Payer: Self-pay | Admitting: Oncology

## 2017-10-06 LAB — CANCER ANTIGEN 27.29: CA 27.29: 26.8 U/mL (ref 0.0–38.6)

## 2017-10-06 NOTE — Telephone Encounter (Signed)
Gave patient AVs and calendar of upcoming appointments.  °

## 2017-10-15 ENCOUNTER — Other Ambulatory Visit: Payer: Self-pay | Admitting: Oncology

## 2017-10-15 DIAGNOSIS — C50312 Malignant neoplasm of lower-inner quadrant of left female breast: Secondary | ICD-10-CM

## 2017-10-15 DIAGNOSIS — Z170 Estrogen receptor positive status [ER+]: Principal | ICD-10-CM

## 2017-11-04 ENCOUNTER — Inpatient Hospital Stay: Payer: BLUE CROSS/BLUE SHIELD

## 2017-11-04 ENCOUNTER — Inpatient Hospital Stay: Payer: BLUE CROSS/BLUE SHIELD | Attending: Oncology

## 2017-11-04 DIAGNOSIS — C50312 Malignant neoplasm of lower-inner quadrant of left female breast: Secondary | ICD-10-CM | POA: Diagnosis not present

## 2017-11-04 DIAGNOSIS — Z170 Estrogen receptor positive status [ER+]: Secondary | ICD-10-CM

## 2017-11-04 DIAGNOSIS — C7800 Secondary malignant neoplasm of unspecified lung: Secondary | ICD-10-CM

## 2017-11-04 LAB — COMPREHENSIVE METABOLIC PANEL
ALT: 67 U/L — ABNORMAL HIGH (ref 0–55)
AST: 42 U/L — ABNORMAL HIGH (ref 5–34)
Albumin: 4.3 g/dL (ref 3.5–5.0)
Alkaline Phosphatase: 78 U/L (ref 40–150)
Anion gap: 10 (ref 3–11)
BUN: 11 mg/dL (ref 7–26)
CO2: 24 mmol/L (ref 22–29)
Calcium: 9.6 mg/dL (ref 8.4–10.4)
Chloride: 105 mmol/L (ref 98–109)
Creatinine, Ser: 0.86 mg/dL (ref 0.60–1.10)
GFR calc Af Amer: >60 mL/min (ref >60)
GFR calc non Af Amer: >60 mL/min (ref >60)
Glucose, Bld: 172 mg/dL — ABNORMAL HIGH (ref 70–140)
Potassium: 4 mmol/L (ref 3.5–5.1)
Sodium: 139 mmol/L (ref 136–145)
Total Bilirubin: 0.4 mg/dL (ref 0.2–1.2)
Total Protein: 7.2 g/dL (ref 6.4–8.3)

## 2017-11-04 LAB — CBC WITH DIFFERENTIAL/PLATELET
Basophils Absolute: 0 10*3/uL (ref 0.0–0.1)
Basophils Relative: 1 %
Eosinophils Absolute: 0.1 10*3/uL (ref 0.0–0.5)
Eosinophils Relative: 2 %
HCT: 36.6 % (ref 34.8–46.6)
Hemoglobin: 12.9 g/dL (ref 11.6–15.9)
Lymphocytes Relative: 47 %
Lymphs Abs: 1.3 10*3/uL (ref 0.9–3.3)
MCH: 31.7 pg (ref 25.1–34.0)
MCHC: 35.2 g/dL (ref 31.5–36.0)
MCV: 89.9 fL (ref 79.5–101.0)
Monocytes Absolute: 0.3 10*3/uL (ref 0.1–0.9)
Monocytes Relative: 12 %
Neutro Abs: 1.1 10*3/uL — ABNORMAL LOW (ref 1.5–6.5)
Neutrophils Relative %: 38 %
Platelets: 200 10*3/uL (ref 145–400)
RBC: 4.07 MIL/uL (ref 3.70–5.45)
RDW: 14.8 % — ABNORMAL HIGH (ref 11.2–14.5)
WBC: 2.9 10*3/uL — ABNORMAL LOW (ref 3.9–10.3)

## 2017-11-05 LAB — CANCER ANTIGEN 27.29: CA 27.29: 27.9 U/mL (ref 0.0–38.6)

## 2017-11-08 ENCOUNTER — Telehealth: Payer: Self-pay | Admitting: *Deleted

## 2017-11-08 NOTE — Telephone Encounter (Signed)
Received voice mail message from patient stating,"I had my labs last week. Am I OK to start the next round of Ibrance on Wednesday? Return number is [phone]."

## 2017-11-10 ENCOUNTER — Telehealth: Payer: Self-pay

## 2017-11-10 NOTE — Telephone Encounter (Signed)
Per Dr Jana Hakim, pt can start next cycle of Ibrance with ANC of 1.1. Pt iinstructed on the use of neutropenic precautions and to call this office with any fever or cold like symptoms.  Pt verbalizes understanding.

## 2017-11-30 NOTE — Progress Notes (Signed)
[place]  [phone] [phone]     ID: Debbie [name] OB: Jul 16, 1965  52 years old  MR#: [id number]  QHU#:[id number]  PCP: Harlan Stains, MD GYN:  [name]: [name]; [name] OTHER MD: Cline Cools, [place]  CHIEF COMPLAINT: locally advanced estrogen receptor positive breast cancer  CURRENT TREATMENT:  Anastrozole, palbociclib  INTERVAL HISTORY: Debbie [name] returns today for follow-up and treatment of her estrogen receptor positive metastatic breast cancer accompanied by her husband. She continues on anastrozole, with good tolerance. She denies issues with hot flashes or vaginal dryness.   She also receives palbociclib, currently at 125 mg, 3 weeks on and 1 weeks off. She also tolerates this well. She has diarrhea about once per week. She notes that 1 episode lasts for about 30 minutes and starts out as soft bowels and then turns to liquid. She also experiences some fatigue.   REVIEW OF SYSTEMS: [name] reports that she spent about 16 days in [place]. The weather there wasn't as hot as here when she went. She notes that she and her sister are planning a trip to [facility] for her  birthday. She denies unusual headaches, visual changes, nausea, vomiting, or dizziness. There has been no unusual cough, phlegm production, or pleurisy. This been no change in bowel or bladder habits. She denies unexplained fatigue or unexplained weight loss, bleeding, rash, or fever. A detailed review of systems was otherwise stable.    BREAST CANCER HISTORY: As per previously documented note:  [name] had routine yearly screening mammography at the breast center 06/19/2013 showing a possible mass in the left breast. The right breast was unremarkable. On 06/30/2013 additional views of the left breast showed an irregular spiculated dense mass measuring 2.2 cm in the lower inner quadrant. This was palpable at the 8:00 location. Ultrasound  showed an irregular hypoechoic mass measuring 2.3 cm and in the left axilla a dominant abnormal appearing lymph node with cortical thickening measuring 1.2 cm.  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  Subsequently the patient underwent bilateral breast MRI. This showed her breasts to be dense (composition C.). In the left breast at the 8:00 position there was a 2.4 cm enhancing mass containing a biopsy clip. There were no additional areas of concern. In the axilla on the left there was a 2.6 cm enlarged left axillary lymph node. There was also a 0.5 cm left internal mammary lymph nodes noted. The right side was unremarkable.  The patient's subsequent history is as detailed below.  PAST MEDICAL HISTORY: Past Medical History:  Diagnosis Date  . Abnormal Pap smear   . Anxiety   . Breast cancer ([facility]) 2015   ER+/PR+/Her2- Left - Dr. Earlie Lou  . GERD (gastroesophageal reflux disease)    occasional  . Hyperlipidemia    no meds   . IBS (irritable bowel syndrome)   . IBS (irritable bowel syndrome)   . Menorrhagia   . Migraines    menstral  . Migraines   . MVP (mitral valve prolapse)    echo 2/15-mitral valve normal-no regurg,no stenosis  . Pre-diabetes  no meds  . Radiation 02/14/14-04/03/14   Left Breast  . Seasonal allergies   . SVD (spontaneous vaginal delivery)    x 3    PAST SURGICAL HISTORY: Past Surgical History:  Procedure Laterality Date  . BREAST SURGERY Left 2015   lumpectomy x 2 - Dr. Jana Hakim  . COLONOSCOPY    . DILATATION & CURRETTAGE/HYSTEROSCOPY WITH RESECTOCOPE N/A 09/14/2017   Procedure:  DILATATION & CURETTAGE/HYSTEROSCOPY WITH RESECTOCOPE;  Surgeon: [name], MD;  Location: [facility];  Service: Gynecology;  Laterality: N/A;  . ENDOMETRIAL ABLATION  2013  . PORT-A-CATH REMOVAL N/A 05/15/2014   Procedure: REMOVAL PORT-A-CATH;  Surgeon: [name], MD;  Location: [facility];  Service: General;  Laterality: N/A;  . PORTACATH PLACEMENT Left 07/18/2013   Procedure: INSERTION PORT-A-CATH;  Surgeon: [name], MD;  Location: [facility];  Service: General;  Laterality: Left;  . SVD      x 3  . VULVA /PERINEUM BIOPSY  2011  . WISDOM TEETH REMOVAL      FAMILY HISTORY Family History  Problem Relation Age of Onset  . Cancer Maternal Grandmother        BREAST AND UTERINE CANCER  . Uterine cancer Maternal Grandmother 84  . Hyperlipidemia Mother   . Hypertension Mother   . Multiple myeloma Father 60  . Lung cancer Maternal Manufacturing engineer  . Testicular cancer Other 33  The patient's father is living, currently age 58. He has a history of multiple myeloma. The patient's mother is 51.  She was diagnosed with ductal carcinoma in situ in 2016.The patient's parents live in [place] The patient had no brothers, 2 sisters. There is no history of breast or ovarian cancer in the family. She believes one of her grandfathers died from lung cancer and another from pancreatic cancer, but is not sure  GYNECOLOGIC HISTORY:  Menarche age 52, first live birth age 52, which the patient understands is an independent risk factor for breast cancer. She is GX P3. She was still having regular periods at the start of chemotherapy. LMP was March 2015. She tells me Dr. Cletis Media checked her labs December 2015 and found that she was in the menopausal range. 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 Her husband [name] is a Energy manager at [employer]. Her 3  children are [name] (16), [name] (12) and [name] (9). The patient attends a local [place]: Not in place   HEALTH MAINTENANCE: Social History   Tobacco Use  . Smoking status: Never Smoker  . Smokeless tobacco: Never Used  Substance Use Topics  . Alcohol use: No  . Drug use: No     Colonoscopy: January 2015  HQI:ONGEXBMW 2014  Bone density:  Lipid panel:  Allergies  Allergen Reactions  . Dust Mite Extract Itching    Current Outpatient Medications  Medication Sig Dispense Refill  . ALPRAZolam (XANAX) 0.5 MG tablet Take 20 minutes before MRI; may repeat once 5 tablet 0  . anastrozole (ARIMIDEX) 1 MG tablet Take 1 tablet (1 mg total) by mouth daily. (Patient taking differently: Take 1 mg by mouth at bedtime. ) 90 tablet 4  . cetirizine (ZYRTEC) 10 MG tablet Take 10 mg by mouth daily.    . Cholecalciferol (VITAMIN D3) 2000 units TABS Take by mouth daily.     Marland Kitchen  frovatriptan (FROVA) 2.5 MG tablet Take 1 tablet (2.5 mg total) by mouth as needed for migraine. If recurs, may repeat after 2 hours. Max of 3 tabs in 24 hours. 10 tablet 0  . IBRANCE 125 MG capsule TAKE 1 CAPSULE DAILY WITH FOOD FOR 3 WEEKS ON AND 1 WEEK OFF, REPEAT EVERY 4 WEEKS 21 capsule 6  . mometasone (NASONEX) 50 MCG/ACT nasal spray Place 2 sprays into the nose daily as needed (for allergies).     Marland Kitchen omeprazole (PRILOSEC) 40 MG capsule Take 1 capsule (40 mg total) by mouth at bedtime. 60 capsule 4  . oxymetazoline (AFRIN) 0.05 % nasal spray Place 2 sprays into the nose daily as needed. congestion    . PARoxetine (PAXIL) 20 MG tablet Take 20 mg by mouth every morning.     No current facility-administered medications for this visit.     OBJECTIVE: Middle-aged white woman who appears stated age  52:   12/01/17 1400  BP: 111/75  Pulse: 73  Resp: 17  Temp: 98.5 F (36.9 C)  SpO2: 98%     Body mass index is 37.51 kg/m.    ECOG FS:1 - Symptomatic but completely  ambulatory  Sclerae unicteric, pupils round and equal Oropharynx clear and moist No cervical or supraclavicular adenopathy Lungs no rales or rhonchi Heart regular rate and rhythm Abd soft, nontender, positive bowel sounds MSK no focal spinal tenderness, no left upper extremity lymphedema Neuro: nonfocal, well oriented, appropriate affect Breasts: The right breast is unremarkable.  The left breast has undergone lumpectomy and radiation.  There is no evidence of local recurrence.  Both axillae are benign.      Component Value Date/Time   NA 139 11/04/2017 1508   NA 139 04/28/2017 1326   K 4.0 11/04/2017 1508   K 4.1 04/28/2017 1326   CL 105 11/04/2017 1508   CO2 24 11/04/2017 1508   CO2 20 (L) 04/28/2017 1326   GLUCOSE 172 (H) 11/04/2017 1508   GLUCOSE 152 (H) 04/28/2017 1326   BUN 11 11/04/2017 1508   BUN 12.7 04/28/2017 1326   CREATININE 0.86 11/04/2017 1508   CREATININE 0.8 04/28/2017 1326   CALCIUM 9.6 11/04/2017 1508   CALCIUM 9.0 04/28/2017 1326   PROT 7.2 11/04/2017 1508   PROT 7.3 04/28/2017 1326   ALBUMIN 4.3 11/04/2017 1508   ALBUMIN 4.1 04/28/2017 1326   AST 42 (H) 11/04/2017 1508   AST 23 04/28/2017 1326   ALT 67 (H) 11/04/2017 1508   ALT 37 04/28/2017 1326   ALKPHOS 78 11/04/2017 1508   ALKPHOS 94 04/28/2017 1326   BILITOT 0.4 11/04/2017 1508   BILITOT 0.42 04/28/2017 1326   GFRNONAA >60 11/04/2017 1508   GFRAA >60 11/04/2017 1508    I No results found for: SPEP  Lab Results  Component Value Date   WBC 2.6 (L) 12/01/2017   NEUTROABS 1.2 (L) 12/01/2017   HGB 12.7 12/01/2017   HCT 36.9 12/01/2017   MCV 91.8 12/01/2017   PLT 233 12/01/2017      Chemistry      Component Value Date/Time   NA 139 11/04/2017 1508   NA 139 04/28/2017 1326   K 4.0 11/04/2017 1508   K 4.1 04/28/2017 1326   CL 105 11/04/2017 1508   CO2 24 11/04/2017 1508   CO2 20 (L) 04/28/2017 1326   BUN 11 11/04/2017 1508   BUN 12.7 04/28/2017 1326   CREATININE 0.86 11/04/2017  1508   CREATININE 0.8 04/28/2017 1326  Component Value Date/Time   CALCIUM 9.6 11/04/2017 1508   CALCIUM 9.0 04/28/2017 1326   ALKPHOS 78 11/04/2017 1508   ALKPHOS 94 04/28/2017 1326   AST 42 (H) 11/04/2017 1508   AST 23 04/28/2017 1326   ALT 67 (H) 11/04/2017 1508   ALT 37 04/28/2017 1326   BILITOT 0.4 11/04/2017 1508   BILITOT 0.42 04/28/2017 1326       No results found for: LABCA2  No components found for: LABCA125  No results for input(s): INR in the last 168 hours.  Urinalysis    Component Value Date/Time   BILIRUBINUR Negative 09/22/2011 1451   PROTEINUR Trace 09/22/2011 1451   UROBILINOGEN negative 09/22/2011 1451   NITRITE Negative 09/22/2011 1451   LEUKOCYTESUR Trace 09/22/2011 1451   CA 27-29  0.0 - 38.6 U/mL 27.9  26.8 CM 29.3 CM     STUDIES: Most recent staging scan was a chest CT obtained 09/24/2017.   ASSESSMENT: 52 y.o. [place] woman status post left breast lower inner quadrant and left axillary lymph node biopsy 06/30/2013 for a clinical T2 N1-2, stage IIB/IIIA invasive ductal carcinoma, grade 2, estrogen and progesterone receptors both strongly positive, HER-2 nonamplified, with an MIB-1 of 36%.  (1) there is a 5 mm left internal mammary lymph node which was negative on PET scan  (2) cyclophosphamide and doxorubicin started 08/04/2014, given in dose dense fashion x4 with Neulasta day 2; completed 09/14/2013, followed by paclitaxel again given in dose dense fashion x4, completed 11/09/2013  (3) left lumpectomy and axillary lymph node dissection 12/13/2013 at [facility] showed a residual pT2 pN1 (mic), stage IIB invasive ductal carcinoma; margins positive for DCIS cleared with subsequent surgery (01/03/2014)  (4) adjuvant radiation therapy completed 04/03/2014  (5) tamoxifen started December 2015, discontinued October 2018  (6) hepatic steatosis: documented on abdomianl US 08/07/2014, with elevated but stable LFTs,  (7) genetic testing  07/24/2013 through the  "Women's Hereditary Panel", performed at [facility], found no deleterious mutations in  ATM, BRCA1, BRCA2, BRIP1, CDH1, CHEK2 (c.1100delC only), EPCAM, MLH1, MSH2, MSH6, NBN, PALB2, PMS2, PTEN, RAD51C, STK11, and TP53.  (8) METASTATIC DISEASE: October 2018 -- chest CT scan 03/17/2017 shows left pleural thickening and a left pleural effusion; PET scan 03/24/2017 shows no liver or bone involvement and no involvement of the right lung.  (a) biopsy of the left pleural mass 04/06/2017 confirms metastatic breast cancer, estrogen and progesterone receptor positive, HER-2 negative  (b) CA 27-27 was 86.3 on 03/30/2017  (9) anastrozole started October 2018  (a) palbociclib added 04/12/2017   PLAN: Freda will soon be a year out from definitive diagnosis of metastatic disease.  Clinically she is very stable.  She is tolerating the anastrozole and palbociclib with no unusual side effects.  Her CA-27-29 has normalized.  She is planning to fly to [facility] for a birthday celebration.  I suggested she consider obtaining a compression sleeve, but she is concerned about cost issues.  If all fails she can wrap the left arm using an Ace bandage.  We discussed the fact that the data for using compression sleeves in flights is controversial.  She will return to see [place] 01/27/2018.  That will be a shared visit and just before that she will have a restaging CT scan of the chest.  I am expecting good news.  Assuming that is the case, we will continue current treatment and plan for a PET scan in December  She knows to call for any other issues that  may develop before the next visit.  Morgon Pamer, [name], MD  12/01/17 2:09 PM Medical Oncology and Hematology [facility] [address] [place], [place] [address] Tel. [phone]    Fax. [phone]  Alice Rieger, am acting as scribe for Chauncey Cruel MD.  I, Lurline Del MD, have reviewed the  above documentation for accuracy and completeness, and I agree with the above.   Marland Kitchen

## 2017-12-01 ENCOUNTER — Inpatient Hospital Stay: Payer: BLUE CROSS/BLUE SHIELD | Attending: Oncology

## 2017-12-01 ENCOUNTER — Telehealth: Payer: Self-pay | Admitting: Oncology

## 2017-12-01 ENCOUNTER — Inpatient Hospital Stay (HOSPITAL_BASED_OUTPATIENT_CLINIC_OR_DEPARTMENT_OTHER): Payer: BLUE CROSS/BLUE SHIELD | Admitting: Oncology

## 2017-12-01 VITALS — BP 111/75 | HR 73 | Temp 98.5°F | Resp 17 | Ht 64.0 in | Wt 218.5 lb

## 2017-12-01 DIAGNOSIS — Z170 Estrogen receptor positive status [ER+]: Secondary | ICD-10-CM

## 2017-12-01 DIAGNOSIS — E785 Hyperlipidemia, unspecified: Secondary | ICD-10-CM | POA: Diagnosis not present

## 2017-12-01 DIAGNOSIS — Z79811 Long term (current) use of aromatase inhibitors: Secondary | ICD-10-CM | POA: Insufficient documentation

## 2017-12-01 DIAGNOSIS — K219 Gastro-esophageal reflux disease without esophagitis: Secondary | ICD-10-CM | POA: Diagnosis not present

## 2017-12-01 DIAGNOSIS — I341 Nonrheumatic mitral (valve) prolapse: Secondary | ICD-10-CM | POA: Diagnosis not present

## 2017-12-01 DIAGNOSIS — K760 Fatty (change of) liver, not elsewhere classified: Secondary | ICD-10-CM | POA: Insufficient documentation

## 2017-12-01 DIAGNOSIS — C7802 Secondary malignant neoplasm of left lung: Secondary | ICD-10-CM

## 2017-12-01 DIAGNOSIS — F419 Anxiety disorder, unspecified: Secondary | ICD-10-CM | POA: Insufficient documentation

## 2017-12-01 DIAGNOSIS — Z803 Family history of malignant neoplasm of breast: Secondary | ICD-10-CM

## 2017-12-01 DIAGNOSIS — Z801 Family history of malignant neoplasm of trachea, bronchus and lung: Secondary | ICD-10-CM | POA: Insufficient documentation

## 2017-12-01 DIAGNOSIS — C7800 Secondary malignant neoplasm of unspecified lung: Secondary | ICD-10-CM

## 2017-12-01 DIAGNOSIS — Z9221 Personal history of antineoplastic chemotherapy: Secondary | ICD-10-CM

## 2017-12-01 DIAGNOSIS — Z808 Family history of malignant neoplasm of other organs or systems: Secondary | ICD-10-CM

## 2017-12-01 DIAGNOSIS — Z923 Personal history of irradiation: Secondary | ICD-10-CM | POA: Diagnosis not present

## 2017-12-01 DIAGNOSIS — C50312 Malignant neoplasm of lower-inner quadrant of left female breast: Secondary | ICD-10-CM | POA: Diagnosis not present

## 2017-12-01 DIAGNOSIS — Z79899 Other long term (current) drug therapy: Secondary | ICD-10-CM

## 2017-12-01 DIAGNOSIS — K589 Irritable bowel syndrome without diarrhea: Secondary | ICD-10-CM | POA: Insufficient documentation

## 2017-12-01 LAB — CBC WITH DIFFERENTIAL/PLATELET
Basophils Absolute: 0 10*3/uL (ref 0.0–0.1)
Basophils Relative: 1 %
Eosinophils Absolute: 0.1 10*3/uL (ref 0.0–0.5)
Eosinophils Relative: 2 %
HCT: 36.9 % (ref 34.8–46.6)
Hemoglobin: 12.7 g/dL (ref 11.6–15.9)
Lymphocytes Relative: 43 %
Lymphs Abs: 1.1 10*3/uL (ref 0.9–3.3)
MCH: 31.6 pg (ref 25.1–34.0)
MCHC: 34.4 g/dL (ref 31.5–36.0)
MCV: 91.8 fL (ref 79.5–101.0)
Monocytes Absolute: 0.2 10*3/uL (ref 0.1–0.9)
Monocytes Relative: 9 %
Neutro Abs: 1.2 10*3/uL — ABNORMAL LOW (ref 1.5–6.5)
Neutrophils Relative %: 45 %
Platelets: 233 10*3/uL (ref 145–400)
RBC: 4.02 MIL/uL (ref 3.70–5.45)
RDW: 15.5 % — ABNORMAL HIGH (ref 11.2–14.5)
WBC: 2.6 10*3/uL — ABNORMAL LOW (ref 3.9–10.3)

## 2017-12-01 LAB — COMPREHENSIVE METABOLIC PANEL
ALT: 95 U/L — ABNORMAL HIGH (ref 0–44)
AST: 54 U/L — ABNORMAL HIGH (ref 15–41)
Albumin: 4.3 g/dL (ref 3.5–5.0)
Alkaline Phosphatase: 77 U/L (ref 38–126)
Anion gap: 7 (ref 5–15)
BUN: 9 mg/dL (ref 6–20)
CO2: 26 mmol/L (ref 22–32)
Calcium: 9.7 mg/dL (ref 8.9–10.3)
Chloride: 105 mmol/L (ref 98–111)
Creatinine, Ser: 0.77 mg/dL (ref 0.44–1.00)
GFR calc Af Amer: >60 mL/min (ref >60)
GFR calc non Af Amer: >60 mL/min (ref >60)
Glucose, Bld: 147 mg/dL — ABNORMAL HIGH (ref 70–99)
Potassium: 4.1 mmol/L (ref 3.5–5.1)
Sodium: 138 mmol/L (ref 135–145)
Total Bilirubin: 0.5 mg/dL (ref 0.3–1.2)
Total Protein: 6.9 g/dL (ref 6.5–8.1)

## 2017-12-01 NOTE — Telephone Encounter (Signed)
Gave patient avs and calendar of upcoming appts.  °

## 2017-12-02 LAB — CANCER ANTIGEN 27.29: CA 27.29: 22.7 U/mL (ref 0.0–38.6)

## 2017-12-16 DIAGNOSIS — C7800 Secondary malignant neoplasm of unspecified lung: Secondary | ICD-10-CM | POA: Diagnosis not present

## 2017-12-16 DIAGNOSIS — F419 Anxiety disorder, unspecified: Secondary | ICD-10-CM | POA: Diagnosis not present

## 2017-12-16 DIAGNOSIS — E1169 Type 2 diabetes mellitus with other specified complication: Secondary | ICD-10-CM | POA: Diagnosis not present

## 2017-12-16 DIAGNOSIS — G43909 Migraine, unspecified, not intractable, without status migrainosus: Secondary | ICD-10-CM | POA: Diagnosis not present

## 2017-12-23 ENCOUNTER — Other Ambulatory Visit: Payer: BLUE CROSS/BLUE SHIELD

## 2017-12-29 ENCOUNTER — Inpatient Hospital Stay: Payer: BLUE CROSS/BLUE SHIELD

## 2017-12-29 DIAGNOSIS — Z79811 Long term (current) use of aromatase inhibitors: Secondary | ICD-10-CM | POA: Diagnosis not present

## 2017-12-29 DIAGNOSIS — K589 Irritable bowel syndrome without diarrhea: Secondary | ICD-10-CM | POA: Diagnosis not present

## 2017-12-29 DIAGNOSIS — F419 Anxiety disorder, unspecified: Secondary | ICD-10-CM | POA: Diagnosis not present

## 2017-12-29 DIAGNOSIS — K219 Gastro-esophageal reflux disease without esophagitis: Secondary | ICD-10-CM | POA: Diagnosis not present

## 2017-12-29 DIAGNOSIS — C50312 Malignant neoplasm of lower-inner quadrant of left female breast: Secondary | ICD-10-CM | POA: Diagnosis not present

## 2017-12-29 DIAGNOSIS — Z9221 Personal history of antineoplastic chemotherapy: Secondary | ICD-10-CM | POA: Diagnosis not present

## 2017-12-29 DIAGNOSIS — Z79899 Other long term (current) drug therapy: Secondary | ICD-10-CM | POA: Diagnosis not present

## 2017-12-29 DIAGNOSIS — C7800 Secondary malignant neoplasm of unspecified lung: Secondary | ICD-10-CM

## 2017-12-29 DIAGNOSIS — K760 Fatty (change of) liver, not elsewhere classified: Secondary | ICD-10-CM | POA: Diagnosis not present

## 2017-12-29 DIAGNOSIS — Z808 Family history of malignant neoplasm of other organs or systems: Secondary | ICD-10-CM | POA: Diagnosis not present

## 2017-12-29 DIAGNOSIS — Z170 Estrogen receptor positive status [ER+]: Secondary | ICD-10-CM | POA: Diagnosis not present

## 2017-12-29 DIAGNOSIS — Z801 Family history of malignant neoplasm of trachea, bronchus and lung: Secondary | ICD-10-CM | POA: Diagnosis not present

## 2017-12-29 DIAGNOSIS — E785 Hyperlipidemia, unspecified: Secondary | ICD-10-CM | POA: Diagnosis not present

## 2017-12-29 DIAGNOSIS — Z923 Personal history of irradiation: Secondary | ICD-10-CM | POA: Diagnosis not present

## 2017-12-29 DIAGNOSIS — I341 Nonrheumatic mitral (valve) prolapse: Secondary | ICD-10-CM | POA: Diagnosis not present

## 2017-12-29 DIAGNOSIS — C7802 Secondary malignant neoplasm of left lung: Secondary | ICD-10-CM | POA: Diagnosis not present

## 2017-12-29 DIAGNOSIS — Z803 Family history of malignant neoplasm of breast: Secondary | ICD-10-CM | POA: Diagnosis not present

## 2017-12-29 LAB — CBC WITH DIFFERENTIAL/PLATELET
Basophils Absolute: 0 10*3/uL (ref 0.0–0.1)
Basophils Relative: 1 %
Eosinophils Absolute: 0.1 10*3/uL (ref 0.0–0.5)
Eosinophils Relative: 3 %
HCT: 36.1 % (ref 34.8–46.6)
Hemoglobin: 12.6 g/dL (ref 11.6–15.9)
Lymphocytes Relative: 41 %
Lymphs Abs: 0.9 10*3/uL (ref 0.9–3.3)
MCH: 32.1 pg (ref 25.1–34.0)
MCHC: 34.8 g/dL (ref 31.5–36.0)
MCV: 92.4 fL (ref 79.5–101.0)
Monocytes Absolute: 0.2 10*3/uL (ref 0.1–0.9)
Monocytes Relative: 9 %
Neutro Abs: 1 10*3/uL — ABNORMAL LOW (ref 1.5–6.5)
Neutrophils Relative %: 46 %
Platelets: 221 10*3/uL (ref 145–400)
RBC: 3.91 MIL/uL (ref 3.70–5.45)
RDW: 14.7 % — ABNORMAL HIGH (ref 11.2–14.5)
WBC: 2.2 10*3/uL — ABNORMAL LOW (ref 3.9–10.3)

## 2017-12-29 LAB — COMPREHENSIVE METABOLIC PANEL
ALT: 153 U/L — ABNORMAL HIGH (ref 0–44)
AST: 78 U/L — ABNORMAL HIGH (ref 15–41)
Albumin: 4.1 g/dL (ref 3.5–5.0)
Alkaline Phosphatase: 77 U/L (ref 38–126)
Anion gap: 8 (ref 5–15)
BUN: 11 mg/dL (ref 6–20)
CO2: 24 mmol/L (ref 22–32)
Calcium: 9.3 mg/dL (ref 8.9–10.3)
Chloride: 105 mmol/L (ref 98–111)
Creatinine, Ser: 0.84 mg/dL (ref 0.44–1.00)
GFR calc Af Amer: >60 mL/min (ref >60)
GFR calc non Af Amer: >60 mL/min (ref >60)
Glucose, Bld: 248 mg/dL — ABNORMAL HIGH (ref 70–99)
Potassium: 3.9 mmol/L (ref 3.5–5.1)
Sodium: 137 mmol/L (ref 135–145)
Total Bilirubin: 0.4 mg/dL (ref 0.3–1.2)
Total Protein: 7 g/dL (ref 6.5–8.1)

## 2017-12-30 ENCOUNTER — Other Ambulatory Visit: Payer: BLUE CROSS/BLUE SHIELD

## 2017-12-30 LAB — CANCER ANTIGEN 27.29: CA 27.29: 23.9 U/mL (ref 0.0–38.6)

## 2018-01-20 DIAGNOSIS — G43909 Migraine, unspecified, not intractable, without status migrainosus: Secondary | ICD-10-CM | POA: Diagnosis not present

## 2018-01-25 ENCOUNTER — Ambulatory Visit (HOSPITAL_COMMUNITY): Payer: BLUE CROSS/BLUE SHIELD

## 2018-01-25 ENCOUNTER — Ambulatory Visit (HOSPITAL_COMMUNITY)
Admission: RE | Admit: 2018-01-25 | Discharge: 2018-01-25 | Disposition: A | Discharge status: Home / Self Care | Payer: BLUE CROSS/BLUE SHIELD | Source: Ambulatory Visit | Attending: Oncology | Admitting: Oncology

## 2018-01-25 ENCOUNTER — Encounter (HOSPITAL_COMMUNITY): Payer: Self-pay

## 2018-01-25 DIAGNOSIS — C50312 Malignant neoplasm of lower-inner quadrant of left female breast: Secondary | ICD-10-CM | POA: Insufficient documentation

## 2018-01-25 DIAGNOSIS — C782 Secondary malignant neoplasm of pleura: Secondary | ICD-10-CM | POA: Diagnosis not present

## 2018-01-25 DIAGNOSIS — K760 Fatty (change of) liver, not elsewhere classified: Secondary | ICD-10-CM | POA: Diagnosis not present

## 2018-01-25 DIAGNOSIS — Z170 Estrogen receptor positive status [ER+]: Secondary | ICD-10-CM | POA: Insufficient documentation

## 2018-01-25 DIAGNOSIS — C7800 Secondary malignant neoplasm of unspecified lung: Secondary | ICD-10-CM

## 2018-01-25 DIAGNOSIS — C50912 Malignant neoplasm of unspecified site of left female breast: Secondary | ICD-10-CM | POA: Diagnosis not present

## 2018-01-25 MED ORDER — IOHEXOL 300 MG/ML  SOLN
75.0000 mL | Freq: Once | INTRAMUSCULAR | Status: AC | PRN
  Administered 2018-01-25: 75 mL via INTRAVENOUS

## 2018-01-26 NOTE — Progress Notes (Addendum)
Debbie [name]  [phone] Fax:(336) [phone]     ID: Debbie [name] OB: 1965-10-27  MR#: [id number]  FIE#:[id number]  PCP: Debbie Stains, MD GYN:  Debbie [name] SU: Debbie [name]; Debbie [name] OTHER MD: Debbie [name], [place]  CHIEF COMPLAINT: locally advanced estrogen receptor positive breast cancer  CURRENT TREATMENT:  Anastrozole, palbociclib  INTERVAL HISTORY: Debbie [name] returns today for follow-up and treatment of her estrogen receptor positive metastatic breast cancer accompanied by her husband. She continues on anastrozole, with good tolerance  She also receives palbociclib, currently at 125 mg, 3 weeks on and 1 weeks off. She is fatigued, described as more tired than usual.  No recent infections.  Tomorrow is her last day on her Palbociclib cycle.     REVIEW OF SYSTEMS: Debbie [name] is doing well today.  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  She has 3 children, and 82, 20, and 37 year old.  Her husband just underwent cholecystectomy today with Dr. Johney [name].  She is waiting for him to come out of recovery.    Debbie [name] recently underwent CT chest on 01/25/2018 that demonstrated improvement of disease.    Debbie [name] is doing well otherwise, and denies any new pain, unintentional weight loss, headaches, chest pain palpitations or any other issues.  A detailed ROS was otherwise non contributory.  I did note that her blood sugar is 344.  She tells me she was supposed to be employing lifestyle modifications, but admits to not doing a great job at that.  Her PCP checked an A1C last month, but she isn't aware of what it was.     BREAST CANCER HISTORY: As per previously documented note:  Debbie [name] had routine yearly screening mammography at the breast center 06/19/2013 showing a possible mass in the left breast. The right breast was unremarkable. On 06/30/2013 additional views of the left breast showed an  irregular spiculated dense mass measuring 2.2 cm in the lower inner quadrant. This was palpable at the 8:00 location. Ultrasound showed an irregular hypoechoic mass measuring 2.3 cm and in the left axilla a dominant abnormal appearing lymph node with cortical thickening measuring 1.2 cm.  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  Subsequently the patient underwent bilateral breast MRI. This showed her breasts to be dense (composition C.). In the left breast at the 8:00 position there was a 2.4 cm enhancing mass containing a biopsy clip. There were no additional areas of concern. In the axilla on the left there was a 2.6 cm enlarged left axillary lymph node. There was also a 0.5 cm left internal mammary lymph nodes noted. The right side was unremarkable.  The patient's subsequent history is as detailed below.  PAST MEDICAL HISTORY: Past Medical History:  Diagnosis Date  . Abnormal Pap smear   . Anxiety   . Breast cancer ([place]) 2015   ER+/PR+/Her2- Left - Dr. Earlie [name]  . GERD (gastroesophageal reflux disease)    occasional  . Hyperlipidemia    no meds   . IBS (irritable bowel syndrome)   . IBS (irritable  bowel syndrome)   . Menorrhagia   . Migraines    menstral  . Migraines   . MVP (mitral valve prolapse)    echo 2/15-mitral valve normal-no regurg,no stenosis  . Pre-diabetes    no meds  . Radiation 02/14/14-04/03/14   Left Breast  . Seasonal allergies   . SVD (spontaneous vaginal delivery)    x 3    PAST SURGICAL HISTORY: Past Surgical History:  Procedure Laterality Date  . BREAST SURGERY Left 2015    lumpectomy x 2 - Dr. Jana [name]  . COLONOSCOPY    . DILATATION & CURRETTAGE/HYSTEROSCOPY WITH RESECTOCOPE N/A 09/14/2017   Procedure: DILATATION & CURETTAGE/HYSTEROSCOPY WITH RESECTOCOPE;  Surgeon: Debbie Bern, MD;  Location: [facility];  Service: Gynecology;  Laterality: N/A;  . ENDOMETRIAL ABLATION  2013  . PORT-A-CATH REMOVAL N/A 05/15/2014   Procedure: REMOVAL PORT-A-CATH;  Surgeon: Debbie Klein, MD;  Location: [facility];  Service: General;  Laterality: N/A;  . PORTACATH PLACEMENT Left 07/18/2013   Procedure: INSERTION PORT-A-CATH;  Surgeon: Debbie Klein, MD;  Location: [facility];  Service: General;  Laterality: Left;  . SVD      x 3  . VULVA /PERINEUM BIOPSY  2011  . WISDOM TEETH REMOVAL      FAMILY HISTORY Family History  Problem Relation Age of Onset  . Cancer Maternal Grandmother        BREAST AND UTERINE CANCER  . Uterine cancer Maternal Grandmother 84  . Hyperlipidemia Mother   . Hypertension Mother   . Multiple myeloma Father 29  . Lung cancer Maternal Manufacturing engineer  . Testicular cancer Other 56  The patient's father is living, currently age 61. He has a history of multiple myeloma. The patient's mother is 90.  She was diagnosed with ductal carcinoma in situ in 2016.The patient's parents live in [place] The patient had no brothers, 2 sisters. There is no history of breast or ovarian cancer in the family. She believes one of her grandfathers died from lung cancer and another from pancreatic cancer, but is not sure  GYNECOLOGIC HISTORY:  Menarche age 60, first live birth age 31, which the patient understands is an independent risk factor for breast cancer. She is GX P3. She was still having regular periods at the start of chemotherapy. LMP was March 2015. She tells me Dr. Cletis Media checked her labs December 2015 and found that she was in the menopausal range. 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 Her husband Debbie [name] is a Energy manager at [employer]. Her 3 children are [name] (16), [name] (12) and [name] (9). The patient attends a local Minor: Not in place   HEALTH MAINTENANCE: Social History   Tobacco Use  . Smoking status: Never Smoker  . Smokeless tobacco: Never Used  Substance Use Topics  . Alcohol use: No  . Drug use: No     Colonoscopy: January 2015  GYK:[id number] 2014  Bone density:  Lipid panel:  Allergies  Allergen Reactions  . Dust Mite Extract Itching    Current Outpatient Medications  Medication Sig Dispense Refill  . ALPRAZolam (XANAX) 0.5 MG tablet Take 20 minutes before MRI; may repeat once 5 tablet 0  . anastrozole (ARIMIDEX) 1 MG tablet Take 1 tablet (1 mg total) by mouth daily. (Patient taking differently: Take 1  mg by mouth at bedtime. ) 90 tablet 4  . cetirizine (ZYRTEC) 10 MG tablet Take 10 mg by mouth daily.    . Cholecalciferol (VITAMIN D3) 2000 units TABS Take by mouth daily.     . frovatriptan (FROVA) 2.5 MG tablet Take 1 tablet (2.5 mg total) by mouth as needed for migraine. If recurs, may repeat after 2 hours. Max of 3 tabs in 24 hours. 10 tablet 0  . IBRANCE 125 MG capsule TAKE 1 CAPSULE DAILY WITH FOOD FOR 3 WEEKS ON AND 1 WEEK OFF, REPEAT EVERY 4 WEEKS 21 capsule 6  . omeprazole (PRILOSEC) 40 MG capsule Take 1 capsule (40 mg total) by mouth at bedtime. 60 capsule 4  . oxymetazoline (AFRIN) 0.05 % nasal spray Place 2 sprays into the nose daily as needed. congestion    . PARoxetine (PAXIL) 20 MG tablet Take 20 mg by mouth every morning.     No current facility-administered medications for this visit.     OBJECTIVE:  Vitals:   01/27/18 1312  BP: 129/74  Pulse: 94  Resp: 17  Temp: 98.3 F (36.8 C)  SpO2: 97%     Body mass index is 37.35 kg/m.    ECOG FS:1 - Symptomatic but completely ambulatory  GENERAL: Patient is a well appearing  female in no acute distress HEENT:  Sclerae anicteric.  Oropharynx clear and moist. No ulcerations or evidence of oropharyngeal candidiasis. Neck is supple.  NODES:  No cervical, supraclavicular, or axillary lymphadenopathy palpated.  BREAST EXAM:  Deferred. LUNGS:  Clear to auscultation bilaterally.  No wheezes or rhonchi. HEART:  Regular rate and rhythm. No murmur appreciated. ABDOMEN:  Soft, nontender.  Positive, normoactive bowel sounds. No organomegaly palpated. MSK:  No focal spinal tenderness to palpation. Full range of motion bilaterally in the upper extremities. EXTREMITIES:  No peripheral edema.   SKIN:  Clear with no obvious rashes or skin changes. No nail dyscrasia. NEURO:  Nonfocal. Well oriented.  Appropriate affect.        Component Value Date/Time   NA 137 01/27/2018 1001   NA 139 04/28/2017 1326   K 3.9 01/27/2018 1001   K 4.1 04/28/2017 1326   CL 105 01/27/2018 1001   CO2 21 (L) 01/27/2018 1001   CO2 20 (L) 04/28/2017 1326   GLUCOSE 344 (H) 01/27/2018 1001   GLUCOSE 152 (H) 04/28/2017 1326   BUN 15 01/27/2018 1001   BUN 12.7 04/28/2017 1326   CREATININE 0.96 01/27/2018 1001   CREATININE 0.8 04/28/2017 1326   CALCIUM 9.4 01/27/2018 1001   CALCIUM 9.0 04/28/2017 1326   PROT 7.1 01/27/2018 1001   PROT 7.3 04/28/2017 1326   ALBUMIN 4.1 01/27/2018 1001   ALBUMIN 4.1 04/28/2017 1326   AST 49 (H) 01/27/2018 1001   AST 23 04/28/2017 1326   ALT 102 (H) 01/27/2018 1001   ALT 37 04/28/2017 1326   ALKPHOS 95 01/27/2018 1001   ALKPHOS 94 04/28/2017 1326   BILITOT 0.5 01/27/2018 1001   BILITOT 0.42 04/28/2017 1326   GFRNONAA >60 01/27/2018 1001   GFRAA >60 01/27/2018 1001    I No results found for: SPEP  Lab Results  Component Value Date   WBC 2.4 (L) 01/27/2018   NEUTROABS 1.2 (L) 01/27/2018   HGB 12.6 01/27/2018   HCT 36.3 01/27/2018   MCV 91.9 01/27/2018   PLT 184 01/27/2018      Chemistry      Component Value Date/Time   NA 137 01/27/2018 1001  NA 139 04/28/2017 1326   K 3.9 01/27/2018 1001   K 4.1 04/28/2017 1326   CL 105 01/27/2018 1001   CO2 21 (L) 01/27/2018 1001   CO2 20 (L) 04/28/2017 1326   BUN 15 01/27/2018 1001   BUN 12.7 04/28/2017 1326   CREATININE 0.96 01/27/2018 1001   CREATININE 0.8 04/28/2017 1326      Component Value Date/Time   CALCIUM 9.4 01/27/2018 1001   CALCIUM 9.0 04/28/2017 1326   ALKPHOS 95 01/27/2018 1001   ALKPHOS 94 04/28/2017 1326   AST 49 (H) 01/27/2018 1001   AST 23 04/28/2017 1326   ALT 102 (H) 01/27/2018 1001   ALT 37 04/28/2017 1326   BILITOT 0.5 01/27/2018 1001   BILITOT 0.42 04/28/2017 1326       No results found for: LABCA2  No components found for: LABCA125  No results for input(s): INR in the last 168 hours.  Urinalysis    Component Value Date/Time   BILIRUBINUR Negative 09/22/2011 1451   PROTEINUR Trace 09/22/2011 1451   UROBILINOGEN negative 09/22/2011 1451   NITRITE Negative 09/22/2011 1451   LEUKOCYTESUR Trace 09/22/2011 1451   CA 27-29  0.0 - 38.6 U/mL 27.9  26.8 CM 29.3 CM     STUDIES: Most recent staging scan was a chest CT obtained 09/24/2017.   ASSESSMENT: 52 y.o. [place] woman status post left breast lower inner quadrant and left axillary lymph node biopsy 06/30/2013 for a clinical T2 N1-2, stage IIB/IIIA invasive ductal carcinoma, grade 2, estrogen and progesterone receptors both strongly positive, HER-2 nonamplified, with an MIB-1 of 36%.  (1) there is a 5 mm left internal mammary lymph node which was negative on PET scan  (2) cyclophosphamide and doxorubicin started 08/04/2014, given in dose dense fashion x4 with Neulasta day 2; completed 09/14/2013, followed by paclitaxel again given in dose dense fashion x4, completed 11/09/2013  (3) left lumpectomy and axillary lymph node dissection 12/13/2013 at [facility] showed a residual pT2 pN1 (mic), stage IIB invasive ductal carcinoma; margins positive for DCIS cleared with subsequent surgery  (01/03/2014)  (4) adjuvant radiation therapy completed 04/03/2014  (5) tamoxifen started December 2015, discontinued October 2018  (6) hepatic steatosis: documented on abdomianl US 08/07/2014, with elevated but stable LFTs,  (7) genetic testing 07/24/2013 through the  "Women's Hereditary Panel", performed at [facility], found no deleterious mutations in  ATM, BRCA1, BRCA2, BRIP1, CDH1, CHEK2 (c.1100delC only), EPCAM, MLH1, MSH2, MSH6, NBN, PALB2, PMS2, PTEN, RAD51C, STK11, and TP53.  (8) METASTATIC DISEASE: October 2018 -- chest CT scan 03/17/2017 shows left pleural thickening and a left pleural effusion; PET scan 03/24/2017 shows no liver or bone involvement and no involvement of the right lung.  (a) biopsy of the left pleural mass 04/06/2017 confirms metastatic breast cancer, estrogen and progesterone receptor positive, HER-2 negative  (b) CA 27-27 was 86.3 on 03/30/2017  (9) anastrozole started October 2018  (a) palbociclib added 04/12/2017  (b) CT chest on 01/26/2018 shows improvement in nodular and plaque like pleural metastatic disease without complete resolution   PLAN: Khylah is doing well today.  She will continue Anastrozole and Palbociclib as prescribed.  She has improvement in her metastatic disease.  She met with Dr. Jana [name] who reviewed everything.  She will undergo PET in November prior to her appointment with him.    Debbie [name] and I reviewed her blood sugars.  I counseled her on the harmful side effects of untreated diabetes.  She is going to work on lifestyle modifications, and  knows I am sending her labs to her PCP so she can note the increase.    Debbie [name] will return monthly for labs, and we will see her in November after her PET.  She knows to call for any other issues that may develop before the next visit.  A total of (30) minutes of face-to-face time was spent with this patient with greater than 50% of that time in counseling and  care-coordination.   Wilber Bihari, NP  01/27/18 2:15 PM Medical Oncology and Hematology [facility] [address] [place], [place] [address]   ADDENDUM: Zharia is now just about a year out from definitive diagnosis of metastatic breast cancer.  Her disease is very well controlled and she has no symptoms related to it.  She is also tolerating her treatments remarkably well.  We have not had to make any dose reductions on her palbociclib.  The plan is to continue this treatment until there is clear evidence of disease progression.  Her very high blood sugar is a concern.  We discussed diet and exercise issues.  She really does need to address this with her primary care physician.  She will see me again in the fall and she will have a PET scan prior to that visit  She knows to call for any other issues that may develop before then.  I personally saw this patient and performed a substantive portion of this encounter with the listed APP documented above.   Chauncey Cruel, MD Medical Oncology and Hematology [facility] [address] [place], [place] [address] Tel. [phone]    Fax. [phone]  .

## 2018-01-27 ENCOUNTER — Telehealth: Payer: Self-pay | Admitting: Adult Health

## 2018-01-27 ENCOUNTER — Encounter: Payer: Self-pay | Admitting: Adult Health

## 2018-01-27 ENCOUNTER — Other Ambulatory Visit: Payer: BLUE CROSS/BLUE SHIELD

## 2018-01-27 ENCOUNTER — Inpatient Hospital Stay (HOSPITAL_BASED_OUTPATIENT_CLINIC_OR_DEPARTMENT_OTHER): Payer: BLUE CROSS/BLUE SHIELD | Admitting: Adult Health

## 2018-01-27 ENCOUNTER — Inpatient Hospital Stay: Payer: BLUE CROSS/BLUE SHIELD | Attending: Oncology

## 2018-01-27 VITALS — BP 129/74 | HR 94 | Temp 98.3°F | Resp 17 | Ht 64.0 in | Wt 217.6 lb

## 2018-01-27 DIAGNOSIS — Z79811 Long term (current) use of aromatase inhibitors: Secondary | ICD-10-CM | POA: Insufficient documentation

## 2018-01-27 DIAGNOSIS — E119 Type 2 diabetes mellitus without complications: Secondary | ICD-10-CM | POA: Insufficient documentation

## 2018-01-27 DIAGNOSIS — Z801 Family history of malignant neoplasm of trachea, bronchus and lung: Secondary | ICD-10-CM

## 2018-01-27 DIAGNOSIS — K219 Gastro-esophageal reflux disease without esophagitis: Secondary | ICD-10-CM | POA: Diagnosis not present

## 2018-01-27 DIAGNOSIS — F419 Anxiety disorder, unspecified: Secondary | ICD-10-CM | POA: Insufficient documentation

## 2018-01-27 DIAGNOSIS — Z923 Personal history of irradiation: Secondary | ICD-10-CM | POA: Diagnosis not present

## 2018-01-27 DIAGNOSIS — Z803 Family history of malignant neoplasm of breast: Secondary | ICD-10-CM

## 2018-01-27 DIAGNOSIS — K589 Irritable bowel syndrome without diarrhea: Secondary | ICD-10-CM | POA: Insufficient documentation

## 2018-01-27 DIAGNOSIS — E785 Hyperlipidemia, unspecified: Secondary | ICD-10-CM | POA: Diagnosis not present

## 2018-01-27 DIAGNOSIS — C782 Secondary malignant neoplasm of pleura: Secondary | ICD-10-CM

## 2018-01-27 DIAGNOSIS — Z79899 Other long term (current) drug therapy: Secondary | ICD-10-CM

## 2018-01-27 DIAGNOSIS — Z8043 Family history of malignant neoplasm of testis: Secondary | ICD-10-CM | POA: Diagnosis not present

## 2018-01-27 DIAGNOSIS — Z8049 Family history of malignant neoplasm of other genital organs: Secondary | ICD-10-CM | POA: Diagnosis not present

## 2018-01-27 DIAGNOSIS — C50312 Malignant neoplasm of lower-inner quadrant of left female breast: Secondary | ICD-10-CM | POA: Insufficient documentation

## 2018-01-27 DIAGNOSIS — I341 Nonrheumatic mitral (valve) prolapse: Secondary | ICD-10-CM

## 2018-01-27 DIAGNOSIS — C7800 Secondary malignant neoplasm of unspecified lung: Secondary | ICD-10-CM

## 2018-01-27 DIAGNOSIS — Z170 Estrogen receptor positive status [ER+]: Secondary | ICD-10-CM | POA: Insufficient documentation

## 2018-01-27 DIAGNOSIS — K760 Fatty (change of) liver, not elsewhere classified: Secondary | ICD-10-CM

## 2018-01-27 DIAGNOSIS — Z8042 Family history of malignant neoplasm of prostate: Secondary | ICD-10-CM

## 2018-01-27 LAB — COMPREHENSIVE METABOLIC PANEL
ALT: 102 U/L — ABNORMAL HIGH (ref 0–44)
AST: 49 U/L — ABNORMAL HIGH (ref 15–41)
Albumin: 4.1 g/dL (ref 3.5–5.0)
Alkaline Phosphatase: 95 U/L (ref 38–126)
Anion gap: 11 (ref 5–15)
BUN: 15 mg/dL (ref 6–20)
CO2: 21 mmol/L — ABNORMAL LOW (ref 22–32)
Calcium: 9.4 mg/dL (ref 8.9–10.3)
Chloride: 105 mmol/L (ref 98–111)
Creatinine, Ser: 0.96 mg/dL (ref 0.44–1.00)
GFR calc Af Amer: >60 mL/min (ref >60)
GFR calc non Af Amer: >60 mL/min (ref >60)
Glucose, Bld: 344 mg/dL — ABNORMAL HIGH (ref 70–99)
Potassium: 3.9 mmol/L (ref 3.5–5.1)
Sodium: 137 mmol/L (ref 135–145)
Total Bilirubin: 0.5 mg/dL (ref 0.3–1.2)
Total Protein: 7.1 g/dL (ref 6.5–8.1)

## 2018-01-27 LAB — CBC WITH DIFFERENTIAL/PLATELET
Basophils Absolute: 0 10*3/uL (ref 0.0–0.1)
Basophils Relative: 0 %
Eosinophils Absolute: 0.1 10*3/uL (ref 0.0–0.5)
Eosinophils Relative: 3 %
HCT: 36.3 % (ref 34.8–46.6)
Hemoglobin: 12.6 g/dL (ref 11.6–15.9)
Lymphocytes Relative: 39 %
Lymphs Abs: 1 10*3/uL (ref 0.9–3.3)
MCH: 31.9 pg (ref 25.1–34.0)
MCHC: 34.7 g/dL (ref 31.5–36.0)
MCV: 91.9 fL (ref 79.5–101.0)
Monocytes Absolute: 0.2 10*3/uL (ref 0.1–0.9)
Monocytes Relative: 7 %
Neutro Abs: 1.2 10*3/uL — ABNORMAL LOW (ref 1.5–6.5)
Neutrophils Relative %: 51 %
Platelets: 184 10*3/uL (ref 145–400)
RBC: 3.95 MIL/uL (ref 3.70–5.45)
RDW: 13.6 % (ref 11.2–14.5)
WBC: 2.4 10*3/uL — ABNORMAL LOW (ref 3.9–10.3)

## 2018-01-27 NOTE — Telephone Encounter (Signed)
Gave pt avs and calendar  °

## 2018-01-28 LAB — CANCER ANTIGEN 27.29: CA 27.29: 13.8 U/mL (ref 0.0–38.6)

## 2018-02-23 ENCOUNTER — Telehealth: Payer: Self-pay

## 2018-02-23 NOTE — Telephone Encounter (Signed)
Per patient request to change her appointment time to 2:30 in the afternoon. Per 9/25 phone msg return. Spoke with patient and she is aware of the time change

## 2018-02-24 ENCOUNTER — Inpatient Hospital Stay: Payer: BLUE CROSS/BLUE SHIELD | Attending: Oncology

## 2018-02-24 ENCOUNTER — Ambulatory Visit: Payer: BLUE CROSS/BLUE SHIELD | Admitting: Adult Health

## 2018-02-24 DIAGNOSIS — C50312 Malignant neoplasm of lower-inner quadrant of left female breast: Secondary | ICD-10-CM

## 2018-02-24 DIAGNOSIS — Z170 Estrogen receptor positive status [ER+]: Secondary | ICD-10-CM | POA: Diagnosis not present

## 2018-02-24 DIAGNOSIS — C7800 Secondary malignant neoplasm of unspecified lung: Secondary | ICD-10-CM

## 2018-02-24 LAB — CBC WITH DIFFERENTIAL/PLATELET
Basophils Absolute: 0 10*3/uL (ref 0.0–0.1)
Basophils Relative: 1 %
Eosinophils Absolute: 0.1 10*3/uL (ref 0.0–0.5)
Eosinophils Relative: 2 %
HCT: 38.2 % (ref 34.8–46.6)
Hemoglobin: 13.2 g/dL (ref 11.6–15.9)
Lymphocytes Relative: 32 %
Lymphs Abs: 1 10*3/uL (ref 0.9–3.3)
MCH: 31.5 pg (ref 25.1–34.0)
MCHC: 34.5 g/dL (ref 31.5–36.0)
MCV: 91.4 fL (ref 79.5–101.0)
Monocytes Absolute: 0.2 10*3/uL (ref 0.1–0.9)
Monocytes Relative: 6 %
Neutro Abs: 1.9 10*3/uL (ref 1.5–6.5)
Neutrophils Relative %: 59 %
Platelets: 234 10*3/uL (ref 145–400)
RBC: 4.18 MIL/uL (ref 3.70–5.45)
RDW: 14.5 % (ref 11.2–14.5)
WBC: 3.3 10*3/uL — ABNORMAL LOW (ref 3.9–10.3)

## 2018-02-24 LAB — COMPREHENSIVE METABOLIC PANEL
ALT: 111 U/L — ABNORMAL HIGH (ref 0–44)
AST: 53 U/L — ABNORMAL HIGH (ref 15–41)
Albumin: 4.2 g/dL (ref 3.5–5.0)
Alkaline Phosphatase: 96 U/L (ref 38–126)
Anion gap: 8 (ref 5–15)
BUN: 12 mg/dL (ref 6–20)
CO2: 25 mmol/L (ref 22–32)
Calcium: 9.6 mg/dL (ref 8.9–10.3)
Chloride: 107 mmol/L (ref 98–111)
Creatinine, Ser: 0.92 mg/dL (ref 0.44–1.00)
GFR calc Af Amer: >60 mL/min (ref >60)
GFR calc non Af Amer: >60 mL/min (ref >60)
Glucose, Bld: 188 mg/dL — ABNORMAL HIGH (ref 70–99)
Potassium: 4 mmol/L (ref 3.5–5.1)
Sodium: 140 mmol/L (ref 135–145)
Total Bilirubin: 0.5 mg/dL (ref 0.3–1.2)
Total Protein: 7.3 g/dL (ref 6.5–8.1)

## 2018-02-25 LAB — CANCER ANTIGEN 27.29: CA 27.29: 17.3 U/mL (ref 0.0–38.6)

## 2018-02-28 ENCOUNTER — Ambulatory Visit: Payer: BLUE CROSS/BLUE SHIELD | Admitting: Registered"

## 2018-03-24 ENCOUNTER — Inpatient Hospital Stay: Payer: BLUE CROSS/BLUE SHIELD | Attending: Oncology

## 2018-03-24 ENCOUNTER — Inpatient Hospital Stay: Payer: BLUE CROSS/BLUE SHIELD

## 2018-03-24 DIAGNOSIS — C7800 Secondary malignant neoplasm of unspecified lung: Secondary | ICD-10-CM

## 2018-03-24 DIAGNOSIS — C50312 Malignant neoplasm of lower-inner quadrant of left female breast: Secondary | ICD-10-CM | POA: Insufficient documentation

## 2018-03-24 DIAGNOSIS — Z170 Estrogen receptor positive status [ER+]: Secondary | ICD-10-CM

## 2018-03-24 LAB — COMPREHENSIVE METABOLIC PANEL
ALT: 136 U/L — ABNORMAL HIGH (ref 0–44)
AST: 81 U/L — ABNORMAL HIGH (ref 15–41)
Albumin: 4.2 g/dL (ref 3.5–5.0)
Alkaline Phosphatase: 92 U/L (ref 38–126)
Anion gap: 12 (ref 5–15)
BUN: 12 mg/dL (ref 6–20)
CO2: 22 mmol/L (ref 22–32)
Calcium: 9.4 mg/dL (ref 8.9–10.3)
Chloride: 107 mmol/L (ref 98–111)
Creatinine, Ser: 0.8 mg/dL (ref 0.44–1.00)
GFR calc Af Amer: >60 mL/min (ref >60)
GFR calc non Af Amer: >60 mL/min (ref >60)
Glucose, Bld: 180 mg/dL — ABNORMAL HIGH (ref 70–99)
Potassium: 3.8 mmol/L (ref 3.5–5.1)
Sodium: 141 mmol/L (ref 135–145)
Total Bilirubin: 0.7 mg/dL (ref 0.3–1.2)
Total Protein: 7.2 g/dL (ref 6.5–8.1)

## 2018-03-24 LAB — CBC WITH DIFFERENTIAL/PLATELET
Abs Immature Granulocytes: 0.01 10*3/uL (ref 0.00–0.07)
Basophils Absolute: 0 10*3/uL (ref 0.0–0.1)
Basophils Relative: 1 %
Eosinophils Absolute: 0.1 10*3/uL (ref 0.0–0.5)
Eosinophils Relative: 3 %
HCT: 39.4 % (ref 36.0–46.0)
Hemoglobin: 13.7 g/dL (ref 12.0–15.0)
Immature Granulocytes: 0 %
Lymphocytes Relative: 38 %
Lymphs Abs: 1.1 10*3/uL (ref 0.7–4.0)
MCH: 31.6 pg (ref 26.0–34.0)
MCHC: 34.8 g/dL (ref 30.0–36.0)
MCV: 90.8 fL (ref 80.0–100.0)
Monocytes Absolute: 0.2 10*3/uL (ref 0.1–1.0)
Monocytes Relative: 8 %
Neutro Abs: 1.5 10*3/uL — ABNORMAL LOW (ref 1.7–7.7)
Neutrophils Relative %: 50 %
Platelets: 226 10*3/uL (ref 150–400)
RBC: 4.34 MIL/uL (ref 3.87–5.11)
RDW: 13.3 % (ref 11.5–15.5)
WBC: 2.9 10*3/uL — ABNORMAL LOW (ref 4.0–10.5)
nRBC: 0 % (ref 0.0–0.2)

## 2018-03-25 LAB — CANCER ANTIGEN 27.29: CA 27.29: 22.9 U/mL (ref 0.0–38.6)

## 2018-04-19 ENCOUNTER — Encounter (HOSPITAL_COMMUNITY)
Admission: RE | Admit: 2018-04-19 | Discharge: 2018-04-19 | Disposition: A | Discharge status: Home / Self Care | Payer: BLUE CROSS/BLUE SHIELD | Source: Ambulatory Visit | Attending: Adult Health | Admitting: Adult Health

## 2018-04-19 DIAGNOSIS — C50312 Malignant neoplasm of lower-inner quadrant of left female breast: Secondary | ICD-10-CM | POA: Insufficient documentation

## 2018-04-19 DIAGNOSIS — C50919 Malignant neoplasm of unspecified site of unspecified female breast: Secondary | ICD-10-CM | POA: Diagnosis not present

## 2018-04-19 DIAGNOSIS — Z170 Estrogen receptor positive status [ER+]: Secondary | ICD-10-CM | POA: Diagnosis not present

## 2018-04-19 DIAGNOSIS — C7800 Secondary malignant neoplasm of unspecified lung: Secondary | ICD-10-CM | POA: Diagnosis not present

## 2018-04-19 LAB — GLUCOSE, CAPILLARY: Glucose-Capillary: 124 mg/dL — ABNORMAL HIGH (ref 70–99)

## 2018-04-19 MED ORDER — FLUDEOXYGLUCOSE F - 18 (FDG) INJECTION
10.5000 | Freq: Once | INTRAVENOUS | Status: AC | PRN
  Administered 2018-04-19: 10.5 via INTRAVENOUS

## 2018-04-21 ENCOUNTER — Inpatient Hospital Stay: Payer: BLUE CROSS/BLUE SHIELD | Attending: Oncology

## 2018-04-21 ENCOUNTER — Inpatient Hospital Stay (HOSPITAL_BASED_OUTPATIENT_CLINIC_OR_DEPARTMENT_OTHER): Payer: BLUE CROSS/BLUE SHIELD | Admitting: Adult Health

## 2018-04-21 ENCOUNTER — Encounter: Payer: Self-pay | Admitting: Adult Health

## 2018-04-21 ENCOUNTER — Telehealth: Payer: Self-pay | Admitting: Oncology

## 2018-04-21 ENCOUNTER — Telehealth: Payer: Self-pay | Admitting: *Deleted

## 2018-04-21 VITALS — BP 116/68 | HR 95 | Temp 98.7°F | Resp 18 | Ht 64.0 in | Wt 217.0 lb

## 2018-04-21 DIAGNOSIS — Z8041 Family history of malignant neoplasm of ovary: Secondary | ICD-10-CM | POA: Insufficient documentation

## 2018-04-21 DIAGNOSIS — Z79899 Other long term (current) drug therapy: Secondary | ICD-10-CM

## 2018-04-21 DIAGNOSIS — Z801 Family history of malignant neoplasm of trachea, bronchus and lung: Secondary | ICD-10-CM

## 2018-04-21 DIAGNOSIS — Z23 Encounter for immunization: Secondary | ICD-10-CM | POA: Insufficient documentation

## 2018-04-21 DIAGNOSIS — C7800 Secondary malignant neoplasm of unspecified lung: Secondary | ICD-10-CM | POA: Diagnosis not present

## 2018-04-21 DIAGNOSIS — Z79811 Long term (current) use of aromatase inhibitors: Secondary | ICD-10-CM | POA: Insufficient documentation

## 2018-04-21 DIAGNOSIS — Z170 Estrogen receptor positive status [ER+]: Secondary | ICD-10-CM | POA: Diagnosis not present

## 2018-04-21 DIAGNOSIS — Z803 Family history of malignant neoplasm of breast: Secondary | ICD-10-CM | POA: Insufficient documentation

## 2018-04-21 DIAGNOSIS — E785 Hyperlipidemia, unspecified: Secondary | ICD-10-CM

## 2018-04-21 DIAGNOSIS — K760 Fatty (change of) liver, not elsewhere classified: Secondary | ICD-10-CM

## 2018-04-21 DIAGNOSIS — C50312 Malignant neoplasm of lower-inner quadrant of left female breast: Secondary | ICD-10-CM

## 2018-04-21 LAB — CBC WITH DIFFERENTIAL/PLATELET
Abs Immature Granulocytes: 0.01 10*3/uL (ref 0.00–0.07)
Basophils Absolute: 0 10*3/uL (ref 0.0–0.1)
Basophils Relative: 0 %
Eosinophils Absolute: 0.1 10*3/uL (ref 0.0–0.5)
Eosinophils Relative: 2 %
HCT: 36.1 % (ref 36.0–46.0)
Hemoglobin: 12.5 g/dL (ref 12.0–15.0)
Immature Granulocytes: 0 %
Lymphocytes Relative: 46 %
Lymphs Abs: 1.2 10*3/uL (ref 0.7–4.0)
MCH: 31.9 pg (ref 26.0–34.0)
MCHC: 34.6 g/dL (ref 30.0–36.0)
MCV: 92.1 fL (ref 80.0–100.0)
Monocytes Absolute: 0.2 10*3/uL (ref 0.1–1.0)
Monocytes Relative: 7 %
Neutro Abs: 1.2 10*3/uL — ABNORMAL LOW (ref 1.7–7.7)
Neutrophils Relative %: 45 %
Platelets: 246 10*3/uL (ref 150–400)
RBC: 3.92 MIL/uL (ref 3.87–5.11)
RDW: 13.8 % (ref 11.5–15.5)
WBC: 2.6 10*3/uL — ABNORMAL LOW (ref 4.0–10.5)
nRBC: 0 % (ref 0.0–0.2)

## 2018-04-21 LAB — COMPREHENSIVE METABOLIC PANEL
ALT: 98 U/L — ABNORMAL HIGH (ref 0–44)
AST: 47 U/L — ABNORMAL HIGH (ref 15–41)
Albumin: 4.1 g/dL (ref 3.5–5.0)
Alkaline Phosphatase: 83 U/L (ref 38–126)
Anion gap: 9 (ref 5–15)
BUN: 7 mg/dL (ref 6–20)
CO2: 22 mmol/L (ref 22–32)
Calcium: 9.1 mg/dL (ref 8.9–10.3)
Chloride: 108 mmol/L (ref 98–111)
Creatinine, Ser: 0.95 mg/dL (ref 0.44–1.00)
GFR calc Af Amer: >60 mL/min (ref >60)
GFR calc non Af Amer: >60 mL/min (ref >60)
Glucose, Bld: 192 mg/dL — ABNORMAL HIGH (ref 70–99)
Potassium: 4.4 mmol/L (ref 3.5–5.1)
Sodium: 139 mmol/L (ref 135–145)
Total Bilirubin: 0.5 mg/dL (ref 0.3–1.2)
Total Protein: 6.9 g/dL (ref 6.5–8.1)

## 2018-04-21 MED ORDER — INFLUENZA VAC SPLIT QUAD 0.5 ML IM SUSY
PREFILLED_SYRINGE | INTRAMUSCULAR | Status: AC
  Filled 2018-04-21: qty 0.5

## 2018-04-21 MED ORDER — INFLUENZA VAC SPLIT QUAD 0.5 ML IM SUSY
0.5000 mL | PREFILLED_SYRINGE | Freq: Once | INTRAMUSCULAR | Status: AC
  Administered 2018-04-21: 0.5 mL via INTRAMUSCULAR

## 2018-04-21 NOTE — Progress Notes (Addendum)
[place]  [phone] [phone]     ID: Debbie [name] OB: [birth year]  MR#: [id number]  TDD#:[id number]  PCP: Debbie Stains, [name] GYN:  Debbie [name] Debbie [name]: Debbie [name]; Debbie [name] Debbie [name]: Debbie [name], [place]  CHIEF COMPLAINT: locally advanced estrogen receptor positive breast cancer  CURRENT TREATMENT:  Anastrozole, palbociclib  INTERVAL HISTORY: Debbie [name] returns today for follow-up and treatment of her estrogen receptor positive metastatic breast cancer accompanied by her husband. She continues on anastrozole, with good tolerance.  She does note some mild hair thinning.  She also receives palbociclib, currently at 125 mg, 3 weeks on and 1 weeks off. She notes some fatigue, but is otherwise feeling well.  She has three more Palbociclib pills for this cycle and then she will on her off week.     REVIEW OF SYSTEMS: Debbie [name] is doing well today.  She is on break from jury duty.  She wants to know if she can get a flu shot.  She denies any issues today.  She is without unusual headaches or vision changes.  She has no breast concerns and undergoes annual mammograms at [facility], due at the beginning of the year.  She denies cough, shortness of breath, chest pain or palpitations.  She isn't noting any bowel/bladder changes, nausea or vomiting.  A detailed ROS was non contributory today.     BREAST CANCER HISTORY: As per previously documented note:  Debbie [name] had routine yearly screening mammography at the breast center 06/19/2013 showing a possible mass in the left breast. The right breast was unremarkable. On 06/30/2013 additional views of the left breast showed an irregular spiculated dense mass measuring 2.2 cm in the lower inner quadrant. This was palpable at the 8:00 location. Ultrasound showed an irregular hypoechoic mass measuring 2.3 cm and in the left axilla a dominant abnormal appearing lymph node with cortical  thickening measuring 1.2 cm.  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  Subsequently the patient underwent bilateral breast MRI. This showed her breasts to be dense (composition C.). In the left breast at the 8:00 position there was a 2.4 cm enhancing mass containing a biopsy clip. There were no additional areas of concern. In the axilla on the left there was a 2.6 cm enlarged left axillary lymph node. There was also a 0.5 cm left internal mammary lymph nodes noted. The right side was unremarkable.  The patient's subsequent history is as detailed below.  PAST MEDICAL HISTORY: Past Medical History:  Diagnosis Date  . Abnormal Pap smear   . Anxiety   . Breast cancer ([name]) 2015   ER+/PR+/Her2- Left - Dr. Earlie [name]  . GERD (gastroesophageal reflux disease)    occasional  . Hyperlipidemia    no meds   . IBS (irritable bowel syndrome)   . IBS (irritable bowel syndrome)   . Menorrhagia   . Migraines    menstral  . Migraines   . Debbie [name] (mitral valve prolapse)    echo 2/15-mitral valve normal-no regurg,no stenosis  . Pre-diabetes    no meds  . Radiation 02/14/14-04/03/14   Left  Breast  . Seasonal allergies   . SVD (spontaneous vaginal delivery)    x 3    PAST SURGICAL HISTORY: Past Surgical History:  Procedure Laterality Date  . BREAST SURGERY Left 2015   lumpectomy x 2 - Dr. Jana [name]  . COLONOSCOPY    . DILATATION & CURRETTAGE/HYSTEROSCOPY WITH RESECTOCOPE N/A 09/14/2017   Procedure: DILATATION & CURETTAGE/HYSTEROSCOPY WITH RESECTOCOPE;  Surgeon: Debbie Bern, [name];  Location: [facility];  Service: Gynecology;   Laterality: N/A;  . ENDOMETRIAL ABLATION  2013  . PORT-A-CATH REMOVAL N/A 05/15/2014   Procedure: REMOVAL PORT-A-CATH;  Surgeon: Debbie Klein, [name];  Location: [facility];  Service: General;  Laterality: N/A;  . PORTACATH PLACEMENT Left 07/18/2013   Procedure: INSERTION PORT-A-CATH;  Surgeon: Debbie Klein, [name];  Location: [facility];  Service: General;  Laterality: Left;  . SVD      x 3  . VULVA /PERINEUM BIOPSY  2011  . WISDOM TEETH REMOVAL      FAMILY HISTORY Family History  Problem Relation Age of Onset  . Cancer Maternal Grandmother        BREAST AND UTERINE CANCER  . Uterine cancer Maternal Grandmother 84  . Hyperlipidemia Mother   . Hypertension Mother   . Multiple myeloma Father 33  . Lung cancer Maternal Manufacturing engineer  . Testicular cancer Debbie 8  The patient's father is living, currently age 35. He has a history of multiple myeloma. The patient's mother is 25.  She was diagnosed with ductal carcinoma in situ in 2016.The patient's parents live in [place] The patient had no brothers, 2 sisters. There is no history of breast or ovarian cancer in the family. She believes one of her grandfathers died from lung cancer and another from pancreatic cancer, but is not sure  GYNECOLOGIC HISTORY:  Menarche age 18, first live birth age 89, which the patient understands is an independent risk factor for breast cancer. She is GX P3. She was still having regular periods at the start of chemotherapy. LMP was March 2015. She tells me Debbie [name] checked her labs December 2015 and found that she was in the menopausal range. 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 Her husband Debbie [name] is a Energy manager at [employer]. Her 3 children are Debbie [name] (16), Debbie [name] (12) and Debbie [name] (9). The patient attends a local [employer]: Not  in place   HEALTH MAINTENANCE: Social History   Tobacco Use  . Smoking status: Never Smoker  . Smokeless tobacco: Never Used  Substance Use Topics  . Alcohol use: No  . Drug use: No     Colonoscopy: January 2015  HCW:CBJSEGBT 2014  Bone density:  Lipid panel:  Allergies  Allergen Reactions  . Dust Mite Extract Itching    Current Outpatient Medications  Medication Sig Dispense Refill  . anastrozole (ARIMIDEX) 1 MG tablet Take 1 tablet (1 mg total) by mouth daily. (Patient taking differently: Take 1 mg by mouth at bedtime. ) 90 tablet 4  . cetirizine (ZYRTEC) 10 MG tablet Take 10 mg by mouth daily.    . Cholecalciferol (VITAMIN D3) 2000 units TABS Take by mouth daily.     . frovatriptan (FROVA) 2.5 MG tablet Take 1 tablet (2.5 mg total) by mouth as needed for migraine. If recurs, may repeat after 2 hours. Max  of 3 tabs in 24 hours. 10 tablet 0  . IBRANCE 125 MG capsule TAKE 1 CAPSULE DAILY WITH FOOD FOR 3 WEEKS ON AND 1 WEEK OFF, REPEAT EVERY 4 WEEKS 21 capsule 6  . Lancets (ONETOUCH DELICA PLUS [id number]) MISC     . omeprazole (PRILOSEC) 40 MG capsule Take 1 capsule (40 mg total) by mouth at bedtime. 60 capsule 4  . oxymetazoline (AFRIN) 0.05 % nasal spray Place 2 sprays into the nose daily as needed. congestion    . PARoxetine (PAXIL) 20 MG tablet Take 20 mg by mouth every morning.    Marland Kitchen ALPRAZolam (XANAX) 0.5 MG tablet Take 20 minutes before MRI; may repeat once (Patient not taking: Reported on 04/21/2018) 5 tablet 0   No current facility-administered medications for this visit.     OBJECTIVE:  Vitals:   04/21/18 1254  BP: 116/68  Pulse: 95  Resp: 18  Temp: 98.7 F (37.1 C)  SpO2: 96%     Body mass index is 37.25 kg/m.    ECOG FS:1 - Symptomatic but completely ambulatory  GENERAL: Patient is a well appearing female in no acute distress HEENT:  Sclerae anicteric.  Oropharynx clear and moist. No ulcerations or evidence of oropharyngeal candidiasis. Neck is supple.   NODES:  No cervical, supraclavicular, or axillary lymphadenopathy palpated.  BREAST EXAM:  Deferred. LUNGS:  Clear to auscultation bilaterally.  No wheezes or rhonchi. HEART:  Regular rate and rhythm. No murmur appreciated. ABDOMEN:  Soft, nontender.  Positive, normoactive bowel sounds. No organomegaly palpated. MSK:  No focal spinal tenderness to palpation. Full range of motion bilaterally in the upper extremities. EXTREMITIES:  No peripheral edema.   SKIN:  Clear with no obvious rashes or skin changes. No nail dyscrasia. NEURO:  Nonfocal. Well oriented.  Appropriate affect.        Component Value Date/Time   NA 139 04/21/2018 1233   NA 139 04/28/2017 1326   K 4.4 04/21/2018 1233   K 4.1 04/28/2017 1326   CL 108 04/21/2018 1233   CO2 22 04/21/2018 1233   CO2 20 (L) 04/28/2017 1326   GLUCOSE 192 (H) 04/21/2018 1233   GLUCOSE 152 (H) 04/28/2017 1326   BUN 7 04/21/2018 1233   BUN 12.7 04/28/2017 1326   CREATININE 0.95 04/21/2018 1233   CREATININE 0.8 04/28/2017 1326   CALCIUM 9.1 04/21/2018 1233   CALCIUM 9.0 04/28/2017 1326   PROT 6.9 04/21/2018 1233   PROT 7.3 04/28/2017 1326   ALBUMIN 4.1 04/21/2018 1233   ALBUMIN 4.1 04/28/2017 1326   AST 47 (H) 04/21/2018 1233   AST 23 04/28/2017 1326   ALT 98 (H) 04/21/2018 1233   ALT 37 04/28/2017 1326   ALKPHOS 83 04/21/2018 1233   ALKPHOS 94 04/28/2017 1326   BILITOT 0.5 04/21/2018 1233   BILITOT 0.42 04/28/2017 1326   GFRNONAA >60 04/21/2018 1233   GFRAA >60 04/21/2018 1233    I No results found for: SPEP  Lab Results  Component Value Date   WBC 2.6 (L) 04/21/2018   NEUTROABS PENDING 04/21/2018   HGB 12.5 04/21/2018   HCT 36.1 04/21/2018   MCV 92.1 04/21/2018   PLT 246 04/21/2018      Chemistry      Component Value Date/Time   NA 139 04/21/2018 1233   NA 139 04/28/2017 1326   K 4.4 04/21/2018 1233   K 4.1 04/28/2017 1326   CL 108 04/21/2018 1233   CO2 22 04/21/2018 1233   CO2 20 (L)  04/28/2017 1326   BUN  7 04/21/2018 1233   BUN 12.7 04/28/2017 1326   CREATININE 0.95 04/21/2018 1233   CREATININE 0.8 04/28/2017 1326      Component Value Date/Time   CALCIUM 9.1 04/21/2018 1233   CALCIUM 9.0 04/28/2017 1326   ALKPHOS 83 04/21/2018 1233   ALKPHOS 94 04/28/2017 1326   AST 47 (H) 04/21/2018 1233   AST 23 04/28/2017 1326   ALT 98 (H) 04/21/2018 1233   ALT 37 04/28/2017 1326   BILITOT 0.5 04/21/2018 1233   BILITOT 0.42 04/28/2017 1326       No results found for: LABCA2  No components found for: LABCA125  No results for input(s): INR in the last 168 hours.  Urinalysis    Component Value Date/Time   BILIRUBINUR Negative 09/22/2011 1451   PROTEINUR Trace 09/22/2011 1451   UROBILINOGEN negative 09/22/2011 1451   NITRITE Negative 09/22/2011 1451   LEUKOCYTESUR Trace 09/22/2011 1451   CA 27-29  0.0 - 38.6 U/mL 27.9  26.8 CM 29.3 CM     STUDIES:    ASSESSMENT: 52 y.o. Debbie [name] woman status post left breast lower inner quadrant and left axillary lymph node biopsy 06/30/2013 for a clinical T2 N1-2, stage IIB/IIIA invasive ductal carcinoma, grade 2, estrogen and progesterone receptors both strongly positive, HER-2 nonamplified, with an MIB-1 of 36%.  (1) there is a 5 mm left internal mammary lymph node which was negative on PET scan  (2) cyclophosphamide and doxorubicin started 08/04/2014, given in dose dense fashion x4 with Neulasta day 2; completed 09/14/2013, followed by paclitaxel again given in dose dense fashion x4, completed 11/09/2013  (3) left lumpectomy and axillary lymph node dissection 12/13/2013 at [facility] showed a residual pT2 pN1 (mic), stage IIB invasive ductal carcinoma; margins positive for DCIS cleared with subsequent surgery (01/03/2014)  (4) adjuvant radiation therapy completed 04/03/2014  (5) tamoxifen started December 2015, discontinued October 2018  (6) hepatic steatosis: documented on abdomianl US 08/07/2014, with elevated but stable LFTs,  (7) genetic  testing 07/24/2013 through the  "Women's Hereditary Panel", performed at [facility], found no deleterious mutations in  ATM, BRCA1, BRCA2, BRIP1, CDH1, CHEK2 (c.1100delC only), EPCAM, MLH1, MSH2, MSH6, NBN, PALB2, PMS2, PTEN, RAD51C, STK11, and TP53.  (8) METASTATIC DISEASE: October 2018 -- chest CT scan 03/17/2017 shows left pleural thickening and a left pleural effusion; PET scan 03/24/2017 shows no liver or bone involvement and no involvement of the right lung.  (a) biopsy of the left pleural mass 04/06/2017 confirms metastatic breast cancer, estrogen and progesterone receptor positive, HER-2 negative  (b) CA 27-27 was 86.3 on 03/30/2017  (9) anastrozole started October 2018  (a) palbociclib added 04/12/2017  (b) CT chest on 01/26/2018 shows improvement in nodular and plaque like pleural metastatic disease without complete resolution  (c) PET scan on 04/19/18 consistent with continued improvement of metastatic disease   PLAN: Debbie [name] is doing well today.  I reviewed her labs which were stable for the most part.  She will continue with Anastrozole and Palbociclib as she is tolerating this well.    I reviewed her scans with her, which show substantial improvement of her pulmonary metastatic disease.  This is great news.   Bobbyjo will receive a flu shot today.     Dr. Jana [name] came in and reviewed her scans as well and talked to her about fatigue.  He recommended that she start exercising.    Mackinze will continue to have labs monthly, and will see Dr. Jana [name]  in 07/2018.  She knows to call for any questions or concerns prior to her next appointment with [place].     Debbie Bihari, NP  04/21/18 1:14 PM Medical Oncology and Hematology [facility] [address] [place], [place] [address]   ADDENDUM: Debbie [name] is now a little over a year out from definite diagnosis of metastatic disease, and her restaging studies are very favorable.  Furthermore she is tolerating the  anastrozole and palbociclib remarkably well.  We reviewed her films today and we are making no changes in her treatment.  We will continue to check her lab work monthly and she will see me again in March.  I do not plan to do Debbie staging studies for another 6 months unless there is a specific symptoms to evaluate  She and I both are delighted of these results.   I personally saw this patient and performed a substantive portion of this encounter with the listed APP documented above.   Chauncey Cruel, [name] Medical Oncology and Hematology [facility] [address] [place], [place] [address] Tel. [phone]    Fax. [phone]

## 2018-04-21 NOTE — Telephone Encounter (Signed)
Gave patient avs and calendar.   °

## 2018-04-21 NOTE — Telephone Encounter (Signed)
This RN spoke with pt yesterday and gave results of PET scan- per discussion Analeia states she has jury duty on 11/21 " and in the business of everything just didn't realize it was the same day as my appointment "  Plan is pt was to call late in day post calling the [place] to verify she has to attend- if so her appointment here will be rescheduled.  Keyaira left VM stating she is being requesting to attend for jury duty today.  This RN sent an URGENT message to reschedule appointment.  This note will be sent to provider for communication- no further needs at this time.

## 2018-04-22 LAB — CANCER ANTIGEN 27.29: CA 27.29: 22.6 U/mL (ref 0.0–38.6)

## 2018-05-06 ENCOUNTER — Other Ambulatory Visit: Payer: Self-pay | Admitting: Oncology

## 2018-05-06 DIAGNOSIS — Z170 Estrogen receptor positive status [ER+]: Principal | ICD-10-CM

## 2018-05-06 DIAGNOSIS — C50312 Malignant neoplasm of lower-inner quadrant of left female breast: Secondary | ICD-10-CM

## 2018-05-07 ENCOUNTER — Other Ambulatory Visit: Payer: Self-pay | Admitting: Oncology

## 2018-05-17 ENCOUNTER — Telehealth: Payer: Self-pay | Admitting: Oncology

## 2018-05-17 NOTE — Telephone Encounter (Signed)
Patient called to reschedule  °

## 2018-05-19 ENCOUNTER — Other Ambulatory Visit: Payer: BLUE CROSS/BLUE SHIELD

## 2018-05-19 ENCOUNTER — Inpatient Hospital Stay: Payer: BLUE CROSS/BLUE SHIELD | Attending: Oncology

## 2018-05-19 DIAGNOSIS — C7800 Secondary malignant neoplasm of unspecified lung: Secondary | ICD-10-CM

## 2018-05-19 DIAGNOSIS — C50312 Malignant neoplasm of lower-inner quadrant of left female breast: Secondary | ICD-10-CM | POA: Diagnosis not present

## 2018-05-19 DIAGNOSIS — Z170 Estrogen receptor positive status [ER+]: Secondary | ICD-10-CM

## 2018-05-19 LAB — CBC WITH DIFFERENTIAL/PLATELET
Abs Immature Granulocytes: 0.01 10*3/uL (ref 0.00–0.07)
Basophils Absolute: 0 10*3/uL (ref 0.0–0.1)
Basophils Relative: 0 %
Eosinophils Absolute: 0.1 10*3/uL (ref 0.0–0.5)
Eosinophils Relative: 2 %
HCT: 36.8 % (ref 36.0–46.0)
Hemoglobin: 13 g/dL (ref 12.0–15.0)
Immature Granulocytes: 0 %
Lymphocytes Relative: 49 %
Lymphs Abs: 1.4 10*3/uL (ref 0.7–4.0)
MCH: 32.1 pg (ref 26.0–34.0)
MCHC: 35.3 g/dL (ref 30.0–36.0)
MCV: 90.9 fL (ref 80.0–100.0)
Monocytes Absolute: 0.3 10*3/uL (ref 0.1–1.0)
Monocytes Relative: 9 %
Neutro Abs: 1.1 10*3/uL — ABNORMAL LOW (ref 1.7–7.7)
Neutrophils Relative %: 40 %
Platelets: 260 10*3/uL (ref 150–400)
RBC: 4.05 MIL/uL (ref 3.87–5.11)
RDW: 13.9 % (ref 11.5–15.5)
WBC: 2.8 10*3/uL — ABNORMAL LOW (ref 4.0–10.5)
nRBC: 0 % (ref 0.0–0.2)

## 2018-05-19 LAB — COMPREHENSIVE METABOLIC PANEL
ALT: 113 U/L — ABNORMAL HIGH (ref 0–44)
AST: 71 U/L — ABNORMAL HIGH (ref 15–41)
Albumin: 4.4 g/dL (ref 3.5–5.0)
Alkaline Phosphatase: 71 U/L (ref 38–126)
Anion gap: 11 (ref 5–15)
BUN: 12 mg/dL (ref 6–20)
CO2: 22 mmol/L (ref 22–32)
Calcium: 9.3 mg/dL (ref 8.9–10.3)
Chloride: 106 mmol/L (ref 98–111)
Creatinine, Ser: 0.64 mg/dL (ref 0.44–1.00)
GFR calc Af Amer: >60 mL/min (ref >60)
GFR calc non Af Amer: >60 mL/min (ref >60)
Glucose, Bld: 120 mg/dL — ABNORMAL HIGH (ref 70–99)
Potassium: 3.8 mmol/L (ref 3.5–5.1)
Sodium: 139 mmol/L (ref 135–145)
Total Bilirubin: 0.9 mg/dL (ref 0.3–1.2)
Total Protein: 7 g/dL (ref 6.5–8.1)

## 2018-05-20 LAB — CANCER ANTIGEN 27.29: CA 27.29: 24.8 U/mL (ref 0.0–38.6)

## 2018-06-16 ENCOUNTER — Inpatient Hospital Stay: Payer: BLUE CROSS/BLUE SHIELD | Attending: Oncology

## 2018-06-16 DIAGNOSIS — C7800 Secondary malignant neoplasm of unspecified lung: Secondary | ICD-10-CM | POA: Insufficient documentation

## 2018-06-16 DIAGNOSIS — E785 Hyperlipidemia, unspecified: Secondary | ICD-10-CM | POA: Insufficient documentation

## 2018-06-16 DIAGNOSIS — Z170 Estrogen receptor positive status [ER+]: Secondary | ICD-10-CM | POA: Insufficient documentation

## 2018-06-16 DIAGNOSIS — C50312 Malignant neoplasm of lower-inner quadrant of left female breast: Secondary | ICD-10-CM | POA: Insufficient documentation

## 2018-06-22 ENCOUNTER — Telehealth: Payer: Self-pay | Admitting: Oncology

## 2018-06-22 NOTE — Telephone Encounter (Signed)
Patient called to reschedule appointment missed  °

## 2018-06-24 ENCOUNTER — Telehealth: Payer: Self-pay | Admitting: *Deleted

## 2018-06-24 ENCOUNTER — Inpatient Hospital Stay: Payer: BLUE CROSS/BLUE SHIELD

## 2018-06-24 DIAGNOSIS — C7800 Secondary malignant neoplasm of unspecified lung: Secondary | ICD-10-CM

## 2018-06-24 DIAGNOSIS — Z170 Estrogen receptor positive status [ER+]: Secondary | ICD-10-CM

## 2018-06-24 DIAGNOSIS — E785 Hyperlipidemia, unspecified: Secondary | ICD-10-CM | POA: Diagnosis not present

## 2018-06-24 DIAGNOSIS — C50312 Malignant neoplasm of lower-inner quadrant of left female breast: Secondary | ICD-10-CM

## 2018-06-24 LAB — CBC WITH DIFFERENTIAL/PLATELET
Abs Immature Granulocytes: 0.03 10*3/uL (ref 0.00–0.07)
Basophils Absolute: 0 10*3/uL (ref 0.0–0.1)
Basophils Relative: 1 %
Eosinophils Absolute: 0.1 10*3/uL (ref 0.0–0.5)
Eosinophils Relative: 2 %
HCT: 40.3 % (ref 36.0–46.0)
Hemoglobin: 14 g/dL (ref 12.0–15.0)
Immature Granulocytes: 1 %
Lymphocytes Relative: 46 %
Lymphs Abs: 1.5 10*3/uL (ref 0.7–4.0)
MCH: 32 pg (ref 26.0–34.0)
MCHC: 34.7 g/dL (ref 30.0–36.0)
MCV: 92.2 fL (ref 80.0–100.0)
Monocytes Absolute: 0.4 10*3/uL (ref 0.1–1.0)
Monocytes Relative: 12 %
Neutro Abs: 1.2 10*3/uL — ABNORMAL LOW (ref 1.7–7.7)
Neutrophils Relative %: 38 %
Platelets: 233 10*3/uL (ref 150–400)
RBC: 4.37 MIL/uL (ref 3.87–5.11)
RDW: 13.7 % (ref 11.5–15.5)
WBC: 3.2 10*3/uL — ABNORMAL LOW (ref 4.0–10.5)
nRBC: 0 % (ref 0.0–0.2)

## 2018-06-24 LAB — COMPREHENSIVE METABOLIC PANEL
ALT: 68 U/L — ABNORMAL HIGH (ref 0–44)
AST: 35 U/L (ref 15–41)
Albumin: 4.4 g/dL (ref 3.5–5.0)
Alkaline Phosphatase: 88 U/L (ref 38–126)
Anion gap: 11 (ref 5–15)
BUN: 9 mg/dL (ref 6–20)
CO2: 25 mmol/L (ref 22–32)
Calcium: 9.8 mg/dL (ref 8.9–10.3)
Chloride: 105 mmol/L (ref 98–111)
Creatinine, Ser: 0.76 mg/dL (ref 0.44–1.00)
GFR calc Af Amer: >60 mL/min (ref >60)
GFR calc non Af Amer: >60 mL/min (ref >60)
Glucose, Bld: 168 mg/dL — ABNORMAL HIGH (ref 70–99)
Potassium: 4.2 mmol/L (ref 3.5–5.1)
Sodium: 141 mmol/L (ref 135–145)
Total Bilirubin: 0.6 mg/dL (ref 0.3–1.2)
Total Protein: 7.5 g/dL (ref 6.5–8.1)

## 2018-06-24 NOTE — Telephone Encounter (Signed)
This RN received communication from [place] as well as a VM from the pt stating need for prior authorization before she can get refill " and I am due to start again next Tuesday " , " I am panicking!"  Noted refill sent in December with refills with receipt as received by [facility].  This RN contacted [facility] per above- and was informed need for new prior authorization for dispensing.  This RN contacted given number as [phone].  Data given per above medication and pt's diagnosis and treatment criteria.  Obtained authorization # [id number] valid 06/01/2018 thru 06/01/2021.  This RN returned call to [facility] to inform of above for mailing out of pt's medication for restart on 06/02/2018.  This note will be sent to oral pharmacy tech for communication of above.

## 2018-06-25 LAB — CANCER ANTIGEN 27.29: CA 27.29: 27.3 U/mL (ref 0.0–38.6)

## 2018-06-29 ENCOUNTER — Telehealth: Payer: Self-pay

## 2018-06-29 NOTE — Telephone Encounter (Signed)
Oral Oncology Patient Advocate Encounter  I received a prior authorization approval letter for [facility]brance from [place].  Approval dates 06/01/18-06/23/21 Case ID: [id number] Phone # [place] Patient [place] Phone [phone] Fax [phone]

## 2018-07-13 NOTE — Progress Notes (Signed)
[place]  [phone]) [phone] Fax:(336) [phone]    ID: Debbie [name] OB: [birth year]  MR#: [id number]  CSN#:[id number]  Patient Care Team: Harlan Stains, MD as PCP - General (Family Medicine) Magrinat, [name], MD as Consulting Physician (Oncology) [name], MD as Referring Physician (Surgery) [name], MD as Consulting Physician (Obstetrics and Gynecology) [name], MD as Consulting Physician (General Surgery) Kyung Felber, MD as Consulting Physician (Radiation Oncology) [name], [name], MD as Consulting Physician (Allergy and Immunology) [name], MD as Consulting Physician (Gastroenterology) [name], MD as Referring Physician (Internal Medicine) [name], [name], MD as Referring Physician (Oncology) OTHER MD:    CHIEF COMPLAINT: 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   She continues on anastrozole. She has some mild vaginal dryness.   She also continues on palbociclib. She has some fatigue. She does not feel any different in her off week.    PET scan on 04/19/18 consistent with continued improvement of metastatic disease.  Since her last visit here, she has been diagnosed with diabetes by Dr. Dema Severin.  She is being treated with diet and exercise.  Results for Debbie [name], Debbie [name] (MRN [id number]) as of 07/13/2018 15:50  Ref. Range 02/24/2018 14:20 03/24/2018 12:13 04/21/2018 12:33 05/19/2018 15:23 06/24/2018 14:04  CA 27.29 Latest Ref Range: 0.0 - 38.6 U/mL 17.3 22.9 22.6 24.8 27.3     REVIEW OF SYSTEMS: Debbie [name] has had some back pain recently, which makes her nervous because that's what lead to her diagnosis. She as had a lot of anxiety about her cancer recently. 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  She has had heartburn recently. She has been having migraines, but her headaches are unchanged since before treatment. She is not currently active, but she does have a gym membership to the [facility]; she is also walking about twice per week. The patient denies unusual headaches, visual changes, nausea, vomiting, or dizziness. There has been no unusual cough, phlegm production, or pleurisy. This been no change in bowel or bladder habits. The patient denies unexplained fatigue or unexplained weight loss, bleeding, rash, or fever. A detailed review of systems was otherwise noncontributory.    BREAST CANCER HISTORY: As per previously documented note:  Debbie [name] had routine yearly screening mammography at the breast center 06/19/2013 showing a possible mass in the left breast. The right breast was unremarkable. On 06/30/2013 additional views of the left breast showed an irregular spiculated dense mass measuring 2.2 cm in the lower inner quadrant. This was palpable at the 8:00 location. Ultrasound showed an irregular hypoechoic mass measuring 2.3 cm and in the left axilla a dominant abnormal appearing lymph node with cortical thickening measuring 1.2 cm.  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  Subsequently the patient underwent bilateral breast MRI. This showed her breasts to be dense (composition C.). In the left breast at the 8:00 position there was a 2.4 cm enhancing mass containing a biopsy clip. There were  no additional areas of concern. In the axilla on the left there was a 2.6 cm enlarged left axillary lymph node. There was also a 0.5 cm left internal  mammary lymph nodes noted. The right side was unremarkable.  The patient's subsequent history is as detailed below.   PAST MEDICAL HISTORY: Past Medical History:  Diagnosis Date   Abnormal Pap smear    Anxiety    Breast cancer ([place]) 2015   ER+/PR+/Her2- Left - Dr. Earlie Lou   GERD (gastroesophageal reflux disease)    occasional   Hyperlipidemia    no meds    IBS (irritable bowel syndrome)    IBS (irritable bowel syndrome)    Menorrhagia    Migraines    menstral   Migraines    MVP (mitral valve prolapse)    echo 2/15-mitral valve normal-no regurg,no stenosis   Pre-diabetes    no meds   Radiation 02/14/14-04/03/14   Left Breast   Seasonal allergies    SVD (spontaneous vaginal delivery)    x 3    PAST SURGICAL HISTORY: Past Surgical History:  Procedure Laterality Date   BREAST SURGERY Left 2015   lumpectomy x 2 - Dr. Jana Hakim   COLONOSCOPY     DILATATION & CURRETTAGE/HYSTEROSCOPY WITH RESECTOCOPE N/A 09/14/2017   Procedure: DILATATION & CURETTAGE/HYSTEROSCOPY WITH RESECTOCOPE;  Surgeon: [name], MD;  Location: [facility];  Service: Gynecology;  Laterality: N/A;   ENDOMETRIAL ABLATION  2013   PORT-A-CATH REMOVAL N/A 05/15/2014   Procedure: REMOVAL PORT-A-CATH;  Surgeon: [name], MD;  Location: [facility];  Service: General;  Laterality: N/A;   PORTACATH PLACEMENT Left 07/18/2013   Procedure: INSERTION PORT-A-CATH;  Surgeon: [name], MD;  Location: [facility];  Service: General;  Laterality: Left;   SVD      x 3   VULVA /PERINEUM BIOPSY  2011   WISDOM TEETH REMOVAL      FAMILY HISTORY Family History  Problem Relation Age of Onset   Cancer Maternal Grandmother        BREAST AND UTERINE CANCER   Uterine cancer Maternal Grandmother 14   Hyperlipidemia Mother    Hypertension Mother    Multiple myeloma Father 11   Lung cancer Maternal Grandfather        welder   Testicular cancer Other 76   The  patient's father is living, currently age 31. He has a history of multiple myeloma. The patient's mother is 65.  She was diagnosed with ductal carcinoma in situ in 2016.The patient's parents live in [place], [place]. The patient had no brothers, 2 sisters. There is no history of breast or ovarian cancer in the family. She believes one of her grandfathers died from lung cancer and another from pancreatic cancer, but is not sure   GYNECOLOGIC HISTORY:  Menarche age 8, first live birth age 37, which the patient understands is an independent risk factor for breast cancer. She is GX P3. She was still having regular periods at the start of chemotherapy. LMP was March 2015. She tells me Dr. Cletis Media checked her labs December 2015 and found that she was in the menopausal range. Note that her husband is s/p vasectomy.    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 Her husband Debbie [name] is a Energy manager at [employer]. Her 3 children are Debbie [name] (19), Debbie [name] (15) and Debbie [name] (12). The patient attends a local [employer].    ADVANCED DIRECTIVES: Not in  place   HEALTH MAINTENANCE: Social History   Tobacco Use   Smoking status: Never Smoker   Smokeless tobacco: Never Used  Substance Use Topics   Alcohol use: No   Drug use: No     Colonoscopy: January 2015  OEV:[id number] 2014  Bone density:  Lipid panel:  Allergies  Allergen Reactions   Dust Mite Extract Itching    Current Outpatient Medications  Medication Sig Dispense Refill   ALPRAZolam (XANAX) 0.5 MG tablet Take 20 minutes before MRI; may repeat once (Patient not taking: Reported on 04/21/2018) 5 tablet 0   anastrozole (ARIMIDEX) 1 MG tablet TAKE 1 TABLET DAILY 90 tablet 4   cetirizine (ZYRTEC) 10 MG tablet Take 10 mg by mouth daily.     Cholecalciferol (VITAMIN D3) 2000 units TABS Take by mouth daily.      frovatriptan (FROVA) 2.5 MG tablet Take 1  tablet (2.5 mg total) by mouth as needed for migraine. If recurs, may repeat after 2 hours. Max of 3 tabs in 24 hours. 10 tablet 0   IBRANCE 125 MG capsule TAKE 1 CAPSULE DAILY WITH FOOD FOR 3 WEEKS ON AND 1 WEEK OFF, REPEAT EVERY 4 WEEKS 21 capsule 13   Lancets (ONETOUCH DELICA PLUS [id number]) MISC      omeprazole (PRILOSEC) 40 MG capsule Take 1 capsule (40 mg total) by mouth at bedtime. 60 capsule 4   oxymetazoline (AFRIN) 0.05 % nasal spray Place 2 sprays into the nose daily as needed. congestion     PARoxetine (PAXIL) 20 MG tablet Take 20 mg by mouth every morning.     No current facility-administered medications for this visit.     OBJECTIVE: Middle-aged white woman in no acute distress  Vitals:   07/14/18 1303  BP: 131/84  Pulse: 85  Resp: 18  Temp: 98.6 F (37 C)  SpO2: 98%     Body mass index is 37.25 kg/m.    ECOG FS:1 - Symptomatic but completely ambulatory   Sclerae unicteric, EOMs intact Oropharynx clear and moist No cervical or supraclavicular adenopathy Lungs no rales or rhonchi Heart regular rate and rhythm Abd soft, nontender, positive bowel sounds MSK no focal spinal tenderness, no upper extremity lymphedema Neuro: nonfocal, well oriented, appropriate affect Breasts: The right breast is unremarkable.  The left breast is status post lumpectomy and radiation.  There is no evidence of local recurrence.  Both axillae are benign.         Component Value Date/Time   NA 138 07/14/2018 1243   NA 139 04/28/2017 1326   K 4.1 07/14/2018 1243   K 4.1 04/28/2017 1326   CL 105 07/14/2018 1243   CO2 24 07/14/2018 1243   CO2 20 (L) 04/28/2017 1326   GLUCOSE 159 (H) 07/14/2018 1243   GLUCOSE 152 (H) 04/28/2017 1326   BUN 10 07/14/2018 1243   BUN 12.7 04/28/2017 1326   CREATININE 0.81 07/14/2018 1243   CREATININE 0.8 04/28/2017 1326   CALCIUM 9.5 07/14/2018 1243   CALCIUM 9.0 04/28/2017 1326   PROT 7.2 07/14/2018 1243   PROT 7.3 04/28/2017 1326   ALBUMIN  4.3 07/14/2018 1243   ALBUMIN 4.1 04/28/2017 1326   AST 47 (H) 07/14/2018 1243   AST 23 04/28/2017 1326   ALT 92 (H) 07/14/2018 1243   ALT 37 04/28/2017 1326   ALKPHOS 90 07/14/2018 1243   ALKPHOS 94 04/28/2017 1326   BILITOT 0.6 07/14/2018 1243   BILITOT 0.42 04/28/2017 1326   GFRNONAA >60 07/14/2018 1243  GFRAA >60 07/14/2018 1243    I No results found for: SPEP  Lab Results  Component Value Date   WBC 2.7 (L) 07/14/2018   NEUTROABS PENDING 07/14/2018   HGB 12.6 07/14/2018   HCT 36.5 07/14/2018   MCV 91.9 07/14/2018   PLT 287 07/14/2018      Chemistry      Component Value Date/Time   NA 138 07/14/2018 1243   NA 139 04/28/2017 1326   K 4.1 07/14/2018 1243   K 4.1 04/28/2017 1326   CL 105 07/14/2018 1243   CO2 24 07/14/2018 1243   CO2 20 (L) 04/28/2017 1326   BUN 10 07/14/2018 1243   BUN 12.7 04/28/2017 1326   CREATININE 0.81 07/14/2018 1243   CREATININE 0.8 04/28/2017 1326      Component Value Date/Time   CALCIUM 9.5 07/14/2018 1243   CALCIUM 9.0 04/28/2017 1326   ALKPHOS 90 07/14/2018 1243   ALKPHOS 94 04/28/2017 1326   AST 47 (H) 07/14/2018 1243   AST 23 04/28/2017 1326   ALT 92 (H) 07/14/2018 1243   ALT 37 04/28/2017 1326   BILITOT 0.6 07/14/2018 1243   BILITOT 0.42 04/28/2017 1326       No results found for: LABCA2  No components found for: LABCA125  No results for input(s): INR in the last 168 hours.  Urinalysis    Component Value Date/Time   BILIRUBINUR Negative 09/22/2011 1451   PROTEINUR Trace 09/22/2011 1451   UROBILINOGEN negative 09/22/2011 1451   NITRITE Negative 09/22/2011 1451   LEUKOCYTESUR Trace 09/22/2011 1451   CA 27-29  0.0 - 38.6 U/mL 27.9  26.8 CM 29.3 CM     STUDIES: No results found.   ASSESSMENT: 53 y.o. [place] woman status post left breast lower inner quadrant and left axillary lymph node biopsy 06/30/2013 for a clinical T2 N1-2, stage IIB/IIIA invasive ductal carcinoma, grade 2, estrogen and progesterone  receptors both strongly positive, HER-2 nonamplified, with an MIB-1 of 36%.  (1) there is a 5 mm left internal mammary lymph node which was negative on PET scan  (2) cyclophosphamide and doxorubicin started 08/04/2014, given in dose dense fashion x4 with Neulasta day 2; completed 09/14/2013, followed by paclitaxel again given in dose dense fashion x4, completed 11/09/2013  (3) left lumpectomy and axillary lymph node dissection 12/13/2013 at [facility] showed a residual pT2 pN1 (mic), stage IIB invasive ductal carcinoma; margins positive for DCIS cleared with subsequent surgery (01/03/2014)  (4) adjuvant radiation therapy completed 04/03/2014  (5) tamoxifen started December 2015, discontinued October 2018  (6) hepatic steatosis: documented on abdomianl US 08/07/2014, with elevated but stable LFTs,  (7) genetic testing 07/24/2013 through the  "Women's Hereditary Panel", performed at [facility], found no deleterious mutations in  ATM, BRCA1, BRCA2, BRIP1, CDH1, CHEK2 (c.1100delC only), EPCAM, MLH1, MSH2, MSH6, NBN, PALB2, PMS2, PTEN, RAD51C, STK11, and TP53.  (8) METASTATIC DISEASE: October 2018 -- chest CT scan 03/17/2017 shows left pleural thickening and a left pleural effusion; PET scan 03/24/2017 shows no liver or bone involvement and no involvement of the right lung.  (a) biopsy of the left pleural mass 04/06/2017 confirms metastatic breast cancer, estrogen and progesterone receptor positive, HER-2 negative  (b) CA 27-27 was 86.3 on 03/30/2017  (9) anastrozole started October 2018  (a) palbociclib added 04/12/2017  (b) CT chest on 01/26/2018 shows improvement in nodular and plaque like pleural metastatic disease without complete resolution  (c) PET scan on 04/19/18 consistent with continued improvement of metastatic disease  PLAN: Sanvi is now about a year and a half out from definitive diagnosis of metastatic breast cancer, with very well-controlled disease.  This is  favorable.  She is tolerating the anastrozole and palbociclib quite well and I am making no changes in those medications.  She will see me again in 3 months and have a CT of the chest shortly prior to that visit.  As far as her diabetes is concerned we discussed diet issues and also she was not quite sure how to test her blood sugar.  I am referring her to the diabetes teaching program so she can get more information  She knows to call for any other issues that may develop before the next visit.  Magrinat, [name], MD  07/14/18 1:32 PM Medical Oncology and Hematology [facility] [address] [place], [place] [address] Tel. [phone]    Fax. [phone]  I, Jacqualyn Posey am acting as a Education administrator for Chauncey Cruel, MD.   I, Lurline Del MD, have reviewed the above documentation for accuracy and completeness, and I agree with the above.

## 2018-07-14 ENCOUNTER — Telehealth: Payer: Self-pay | Admitting: Oncology

## 2018-07-14 ENCOUNTER — Inpatient Hospital Stay (HOSPITAL_BASED_OUTPATIENT_CLINIC_OR_DEPARTMENT_OTHER): Payer: BLUE CROSS/BLUE SHIELD | Admitting: Oncology

## 2018-07-14 ENCOUNTER — Inpatient Hospital Stay: Payer: BLUE CROSS/BLUE SHIELD | Attending: Oncology

## 2018-07-14 VITALS — BP 131/84 | HR 85 | Temp 98.6°F | Resp 18 | Ht 64.0 in | Wt 217.0 lb

## 2018-07-14 DIAGNOSIS — M549 Dorsalgia, unspecified: Secondary | ICD-10-CM

## 2018-07-14 DIAGNOSIS — G43909 Migraine, unspecified, not intractable, without status migrainosus: Secondary | ICD-10-CM

## 2018-07-14 DIAGNOSIS — Z801 Family history of malignant neoplasm of trachea, bronchus and lung: Secondary | ICD-10-CM | POA: Insufficient documentation

## 2018-07-14 DIAGNOSIS — Z807 Family history of other malignant neoplasms of lymphoid, hematopoietic and related tissues: Secondary | ICD-10-CM

## 2018-07-14 DIAGNOSIS — C773 Secondary and unspecified malignant neoplasm of axilla and upper limb lymph nodes: Secondary | ICD-10-CM | POA: Diagnosis not present

## 2018-07-14 DIAGNOSIS — Z800 Family history of malignant neoplasm of digestive organs: Secondary | ICD-10-CM

## 2018-07-14 DIAGNOSIS — E119 Type 2 diabetes mellitus without complications: Secondary | ICD-10-CM

## 2018-07-14 DIAGNOSIS — Z9221 Personal history of antineoplastic chemotherapy: Secondary | ICD-10-CM | POA: Insufficient documentation

## 2018-07-14 DIAGNOSIS — F419 Anxiety disorder, unspecified: Secondary | ICD-10-CM

## 2018-07-14 DIAGNOSIS — Z170 Estrogen receptor positive status [ER+]: Secondary | ICD-10-CM | POA: Insufficient documentation

## 2018-07-14 DIAGNOSIS — C50312 Malignant neoplasm of lower-inner quadrant of left female breast: Secondary | ICD-10-CM | POA: Diagnosis not present

## 2018-07-14 DIAGNOSIS — Z79811 Long term (current) use of aromatase inhibitors: Secondary | ICD-10-CM | POA: Diagnosis not present

## 2018-07-14 DIAGNOSIS — C7802 Secondary malignant neoplasm of left lung: Secondary | ICD-10-CM | POA: Diagnosis not present

## 2018-07-14 DIAGNOSIS — Z923 Personal history of irradiation: Secondary | ICD-10-CM

## 2018-07-14 DIAGNOSIS — C7800 Secondary malignant neoplasm of unspecified lung: Secondary | ICD-10-CM

## 2018-07-14 DIAGNOSIS — Z79899 Other long term (current) drug therapy: Secondary | ICD-10-CM

## 2018-07-14 LAB — COMPREHENSIVE METABOLIC PANEL
ALT: 92 U/L — ABNORMAL HIGH (ref 0–44)
AST: 47 U/L — ABNORMAL HIGH (ref 15–41)
Albumin: 4.3 g/dL (ref 3.5–5.0)
Alkaline Phosphatase: 90 U/L (ref 38–126)
Anion gap: 9 (ref 5–15)
BUN: 10 mg/dL (ref 6–20)
CO2: 24 mmol/L (ref 22–32)
Calcium: 9.5 mg/dL (ref 8.9–10.3)
Chloride: 105 mmol/L (ref 98–111)
Creatinine, Ser: 0.81 mg/dL (ref 0.44–1.00)
GFR calc Af Amer: >60 mL/min (ref >60)
GFR calc non Af Amer: >60 mL/min (ref >60)
Glucose, Bld: 159 mg/dL — ABNORMAL HIGH (ref 70–99)
Potassium: 4.1 mmol/L (ref 3.5–5.1)
Sodium: 138 mmol/L (ref 135–145)
Total Bilirubin: 0.6 mg/dL (ref 0.3–1.2)
Total Protein: 7.2 g/dL (ref 6.5–8.1)

## 2018-07-14 LAB — CBC WITH DIFFERENTIAL/PLATELET
Abs Immature Granulocytes: 0.01 10*3/uL (ref 0.00–0.07)
Basophils Absolute: 0 10*3/uL (ref 0.0–0.1)
Basophils Relative: 0 %
Eosinophils Absolute: 0.1 10*3/uL (ref 0.0–0.5)
Eosinophils Relative: 5 %
HCT: 36.5 % (ref 36.0–46.0)
Hemoglobin: 12.6 g/dL (ref 12.0–15.0)
Immature Granulocytes: 0 %
Lymphocytes Relative: 43 %
Lymphs Abs: 1.2 10*3/uL (ref 0.7–4.0)
MCH: 31.7 pg (ref 26.0–34.0)
MCHC: 34.5 g/dL (ref 30.0–36.0)
MCV: 91.9 fL (ref 80.0–100.0)
Monocytes Absolute: 0.3 10*3/uL (ref 0.1–1.0)
Monocytes Relative: 10 %
Neutro Abs: 1.1 10*3/uL — ABNORMAL LOW (ref 1.7–7.7)
Neutrophils Relative %: 42 %
Platelets: 287 10*3/uL (ref 150–400)
RBC: 3.97 MIL/uL (ref 3.87–5.11)
RDW: 13.2 % (ref 11.5–15.5)
WBC: 2.7 10*3/uL — ABNORMAL LOW (ref 4.0–10.5)
nRBC: 0 % (ref 0.0–0.2)

## 2018-07-14 MED ORDER — ANASTROZOLE 1 MG PO TABS: 1.0000 mg | ORAL_TABLET | Freq: Every day | ORAL | 4 refills | Status: DC

## 2018-07-14 MED ORDER — PALBOCICLIB 125 MG PO CAPS: ORAL_CAPSULE | ORAL | 13 refills | Status: DC

## 2018-07-14 NOTE — Telephone Encounter (Signed)
Gave avs and calendar ° °

## 2018-07-15 ENCOUNTER — Other Ambulatory Visit: Payer: Self-pay | Admitting: *Deleted

## 2018-07-15 DIAGNOSIS — C50312 Malignant neoplasm of lower-inner quadrant of left female breast: Secondary | ICD-10-CM

## 2018-07-15 DIAGNOSIS — Z170 Estrogen receptor positive status [ER+]: Principal | ICD-10-CM

## 2018-07-15 LAB — CANCER ANTIGEN 27.29: CA 27.29: 24.3 U/mL (ref 0.0–38.6)

## 2018-07-15 MED ORDER — PALBOCICLIB 125 MG PO CAPS: ORAL_CAPSULE | ORAL | 13 refills | Status: DC

## 2018-08-02 DIAGNOSIS — Z3040 Encounter for surveillance of contraceptives, unspecified: Secondary | ICD-10-CM | POA: Diagnosis not present

## 2018-08-02 DIAGNOSIS — Z1211 Encounter for screening for malignant neoplasm of colon: Secondary | ICD-10-CM | POA: Diagnosis not present

## 2018-08-02 DIAGNOSIS — Z01419 Encounter for gynecological examination (general) (routine) without abnormal findings: Secondary | ICD-10-CM | POA: Diagnosis not present

## 2018-08-02 DIAGNOSIS — Z6836 Body mass index (BMI) 36.0-36.9, adult: Secondary | ICD-10-CM | POA: Diagnosis not present

## 2018-08-09 ENCOUNTER — Ambulatory Visit: Payer: BLUE CROSS/BLUE SHIELD | Admitting: Dietician

## 2018-08-11 ENCOUNTER — Other Ambulatory Visit: Payer: Self-pay

## 2018-08-11 ENCOUNTER — Inpatient Hospital Stay: Payer: BLUE CROSS/BLUE SHIELD | Attending: Oncology

## 2018-08-11 DIAGNOSIS — C50312 Malignant neoplasm of lower-inner quadrant of left female breast: Secondary | ICD-10-CM | POA: Insufficient documentation

## 2018-08-11 DIAGNOSIS — Z170 Estrogen receptor positive status [ER+]: Secondary | ICD-10-CM | POA: Diagnosis not present

## 2018-08-11 DIAGNOSIS — C7800 Secondary malignant neoplasm of unspecified lung: Secondary | ICD-10-CM

## 2018-08-11 LAB — COMPREHENSIVE METABOLIC PANEL
ALT: 91 U/L — ABNORMAL HIGH (ref 0–44)
AST: 54 U/L — ABNORMAL HIGH (ref 15–41)
Albumin: 4.4 g/dL (ref 3.5–5.0)
Alkaline Phosphatase: 100 U/L (ref 38–126)
Anion gap: 12 (ref 5–15)
BUN: 10 mg/dL (ref 6–20)
CO2: 21 mmol/L — ABNORMAL LOW (ref 22–32)
Calcium: 9.7 mg/dL (ref 8.9–10.3)
Chloride: 105 mmol/L (ref 98–111)
Creatinine, Ser: 0.8 mg/dL (ref 0.44–1.00)
GFR calc Af Amer: >60 mL/min (ref >60)
GFR calc non Af Amer: >60 mL/min (ref >60)
Glucose, Bld: 177 mg/dL — ABNORMAL HIGH (ref 70–99)
Potassium: 4.1 mmol/L (ref 3.5–5.1)
Sodium: 138 mmol/L (ref 135–145)
Total Bilirubin: 0.8 mg/dL (ref 0.3–1.2)
Total Protein: 7.6 g/dL (ref 6.5–8.1)

## 2018-08-11 LAB — CBC WITH DIFFERENTIAL/PLATELET
Abs Immature Granulocytes: 0.01 10*3/uL (ref 0.00–0.07)
Basophils Absolute: 0 10*3/uL (ref 0.0–0.1)
Basophils Relative: 0 %
Eosinophils Absolute: 0.1 10*3/uL (ref 0.0–0.5)
Eosinophils Relative: 3 %
HCT: 37.8 % (ref 36.0–46.0)
Hemoglobin: 13.4 g/dL (ref 12.0–15.0)
Immature Granulocytes: 0 %
Lymphocytes Relative: 45 %
Lymphs Abs: 1.1 10*3/uL (ref 0.7–4.0)
MCH: 32.1 pg (ref 26.0–34.0)
MCHC: 35.4 g/dL (ref 30.0–36.0)
MCV: 90.4 fL (ref 80.0–100.0)
Monocytes Absolute: 0.2 10*3/uL (ref 0.1–1.0)
Monocytes Relative: 9 %
Neutro Abs: 1.1 10*3/uL — ABNORMAL LOW (ref 1.7–7.7)
Neutrophils Relative %: 43 %
Platelets: 314 10*3/uL (ref 150–400)
RBC: 4.18 MIL/uL (ref 3.87–5.11)
RDW: 13.2 % (ref 11.5–15.5)
WBC: 2.6 10*3/uL — ABNORMAL LOW (ref 4.0–10.5)
nRBC: 0 % (ref 0.0–0.2)

## 2018-09-02 DIAGNOSIS — E1169 Type 2 diabetes mellitus with other specified complication: Secondary | ICD-10-CM | POA: Diagnosis not present

## 2018-09-02 DIAGNOSIS — E559 Vitamin D deficiency, unspecified: Secondary | ICD-10-CM | POA: Diagnosis not present

## 2018-09-02 DIAGNOSIS — E785 Hyperlipidemia, unspecified: Secondary | ICD-10-CM | POA: Diagnosis not present

## 2018-09-02 DIAGNOSIS — C50919 Malignant neoplasm of unspecified site of unspecified female breast: Secondary | ICD-10-CM | POA: Diagnosis not present

## 2018-09-07 ENCOUNTER — Other Ambulatory Visit: Payer: Self-pay | Admitting: Oncology

## 2018-09-07 DIAGNOSIS — K760 Fatty (change of) liver, not elsewhere classified: Secondary | ICD-10-CM

## 2018-09-07 DIAGNOSIS — Z170 Estrogen receptor positive status [ER+]: Principal | ICD-10-CM

## 2018-09-07 DIAGNOSIS — E119 Type 2 diabetes mellitus without complications: Secondary | ICD-10-CM

## 2018-09-07 DIAGNOSIS — C50312 Malignant neoplasm of lower-inner quadrant of left female breast: Secondary | ICD-10-CM

## 2018-09-07 DIAGNOSIS — E7800 Pure hypercholesterolemia, unspecified: Secondary | ICD-10-CM

## 2018-09-09 ENCOUNTER — Telehealth: Payer: Self-pay | Admitting: Oncology

## 2018-09-09 NOTE — Telephone Encounter (Signed)
Called patient per 4/10 sch message - unable to reach patient .left message for patient to call back to r/s

## 2018-09-12 ENCOUNTER — Inpatient Hospital Stay: Payer: BLUE CROSS/BLUE SHIELD | Attending: Oncology

## 2018-10-05 ENCOUNTER — Ambulatory Visit (HOSPITAL_COMMUNITY)
Admission: RE | Admit: 2018-10-05 | Discharge: 2018-10-05 | Disposition: A | Discharge status: Home / Self Care | Payer: BLUE CROSS/BLUE SHIELD | Source: Ambulatory Visit | Attending: Oncology | Admitting: Oncology

## 2018-10-05 ENCOUNTER — Other Ambulatory Visit: Payer: Self-pay

## 2018-10-05 ENCOUNTER — Telehealth: Payer: Self-pay | Admitting: Oncology

## 2018-10-05 DIAGNOSIS — Z170 Estrogen receptor positive status [ER+]: Secondary | ICD-10-CM | POA: Diagnosis not present

## 2018-10-05 DIAGNOSIS — C7800 Secondary malignant neoplasm of unspecified lung: Secondary | ICD-10-CM | POA: Diagnosis not present

## 2018-10-05 DIAGNOSIS — E119 Type 2 diabetes mellitus without complications: Secondary | ICD-10-CM

## 2018-10-05 DIAGNOSIS — C50312 Malignant neoplasm of lower-inner quadrant of left female breast: Secondary | ICD-10-CM | POA: Diagnosis not present

## 2018-10-05 DIAGNOSIS — Z853 Personal history of malignant neoplasm of breast: Secondary | ICD-10-CM | POA: Diagnosis not present

## 2018-10-05 MED ORDER — SODIUM CHLORIDE (PF) 0.9 % IJ SOLN
INTRAMUSCULAR | Status: AC
  Filled 2018-10-05: qty 50

## 2018-10-05 MED ORDER — IOHEXOL 300 MG/ML  SOLN
100.0000 mL | Freq: Once | INTRAMUSCULAR | Status: AC | PRN
  Administered 2018-10-05: 75 mL via INTRAVENOUS

## 2018-10-05 NOTE — Telephone Encounter (Signed)
Changed 5/13 to webex and cancelled labs per sch msg. Patient will be contacted  °

## 2018-10-11 ENCOUNTER — Telehealth: Payer: Self-pay | Admitting: Oncology

## 2018-10-11 NOTE — Progress Notes (Signed)
[facility]  [phone]) [phone] Fax:(336) [phone]    ID: [name] OB: [birth year]  MR#: [id number]  CHY#:[id number]  Patient Care Team: Harlan Stains, MD as PCP - General (Family Medicine) , [name], MD as Consulting Physician (Oncology) [name], MD as Referring Physician (Surgery) [name], MD as Consulting Physician (Obstetrics and Gynecology) [name], MD as Consulting Physician (General Surgery) Kyung Penza, MD as Consulting Physician (Radiation Oncology) [name], [name], MD as Consulting Physician (Allergy and Immunology) [name], MD as Consulting Physician (Gastroenterology) [name], MD as Referring Physician (Internal Medicine) [name], [name], MD as Referring Physician (Oncology) OTHER MD:   I connected with [name] on 10/12/18 at  2:30 PM EDT by video enabled telemedicine visit and verified that I am speaking with the correct person using two identifiers.   I discussed the limitations, risks, security and privacy concerns of performing an evaluation and management service by telemedicine and the availability of in-person appointments. I also discussed with the patient that there may be a patient responsible charge related to this service. The patient expressed understanding and agreed to proceed.   Other persons participating in the visit and their role in the encounter: Wilburn Mylar, scribe   Patient's location: HOME  Provider's location: [place]: locally advanced estrogen receptor positive breast cancer  CURRENT TREATMENT:  Anastrozole, palbociclib   INTERVAL HISTORY: Debbie [name] is seen today for follow-up and treatment of her estrogen receptor positive metastatic breast cancer.   She continues on anastrozole. She denies issues with hot flashes or vaginal dryness.   She also continues on palbociclib. She's in her off week right now, and  she doesn't notice a difference. She has some fatigue, but it doesn't bother her. She notes some diarrhea, maybe twice a week, and takes Imodium as needed.   Since her last visit, she underwent chest CT on 10/05/2018. Results showed a stable exam since 04/19/2018 with no evidence of active disease.  Her CA-27-29 remains in the normal range.   REVIEW OF SYSTEMS: Denya reports doing very well overall. She reports her father broke his leg, so she and her family went to [place] to help out for a month. Her husband is a Education officer, museum and was able to teach from there. The patient denies unusual headaches, visual changes, nausea, vomiting, stiff neck, dizziness, or gait imbalance. There has been no cough, phlegm production, or pleurisy, no chest pain or pressure, and no change in bowel or bladder habits. The patient denies fever, rash, bleeding, unexplained fatigue or unexplained weight loss. A detailed review of systems was otherwise entirely negative.   BREAST CANCER HISTORY: As per previously documented note:  T[place] had routine yearly screening mammography at the breast center 06/19/2013 showing a possible mass in the left breast. The right breast was unremarkable. On 06/30/2013 additional views of the left breast showed an irregular spiculated dense mass measuring 2.2 cm in the lower inner quadrant. This was palpable at the 8:00 location. Ultrasound showed an irregular hypoechoic mass measuring 2.3 cm and in the left axilla a dominant abnormal appearing lymph node with cortical thickening measuring 1.2 cm.  Biopsy of the left breast mass the left axillary lymph node in question the same day showed ([id number]) an invasive ductal carcinoma emesis both in the breast mass and lymph node), grade 2, estrogen receptor 100% positive, progesterone receptor 100% positive, both with strong staining intensity, with an MIB-1  of 36% and no HER-2 amplification, the signals ratio being 0.93 and the number per cell 1.95.   Subsequently the patient underwent bilateral breast MRI. This showed her breasts to be dense (composition C.). In the left breast at the 8:00 position there was a 2.4 cm enhancing mass containing a biopsy clip. There were no additional areas of concern. In the axilla on the left there was a 2.6 cm enlarged left axillary lymph node. There was also a 0.5 cm left internal mammary lymph nodes noted. The right side was unremarkable.  The patient's subsequent history is as detailed below.   PAST MEDICAL HISTORY: Past Medical History:  Diagnosis Date  . Abnormal Pap smear   . Anxiety   . Breast cancer ([facility]) 2015   ER+/PR+/Her2- Left - Dr. Earlie Lou  . GERD (gastroesophageal reflux disease)    occasional  . Hyperlipidemia    no meds   . IBS (irritable bowel syndrome)   . IBS (irritable bowel syndrome)   . Menorrhagia   . Migraines    menstral  . Migraines   . MVP (mitral valve prolapse)    echo 2/15-mitral valve normal-no regurg,no stenosis  . Pre-diabetes    no meds  . Radiation 02/14/14-04/03/14   Left Breast  . Seasonal allergies   . SVD (spontaneous vaginal delivery)    x 3    PAST SURGICAL HISTORY: Past Surgical History:  Procedure Laterality Date  . BREAST SURGERY Left 2015   lumpectomy x 2 - Dr. Jana Hakim  . COLONOSCOPY    . DILATATION & CURRETTAGE/HYSTEROSCOPY WITH RESECTOCOPE N/A 09/14/2017   Procedure: DILATATION & CURETTAGE/HYSTEROSCOPY WITH RESECTOCOPE;  Surgeon: [name], MD;  Location: [facility];  Service: Gynecology;  Laterality: N/A;  . ENDOMETRIAL ABLATION  2013  . PORT-A-CATH REMOVAL N/A 05/15/2014   Procedure: REMOVAL PORT-A-CATH;  Surgeon: [name], MD;  Location: [facility];  Service: General;  Laterality: N/A;  . PORTACATH PLACEMENT Left 07/18/2013   Procedure: INSERTION PORT-A-CATH;  Surgeon: [name], MD;  Location: [facility];  Service: General;  Laterality: Left;  . SVD      x 3  . VULVA /PERINEUM BIOPSY  2011  .  WISDOM TEETH REMOVAL      FAMILY HISTORY Family History  Problem Relation Age of Onset  . Cancer Maternal Grandmother        BREAST AND UTERINE CANCER  . Uterine cancer Maternal Grandmother 84  . Hyperlipidemia Mother   . Hypertension Mother   . Multiple myeloma Father 70  . Lung cancer Maternal Manufacturing engineer  . Testicular cancer Other 73   The patient's father is living, currently age 47. He has a history of multiple myeloma. The patient's mother is 20 and has Parkinson's.  She was diagnosed with ductal carcinoma in situ in 2016. The patient's parents live in [place], [place]. The patient had no brothers, 2 sisters. There is no history of breast or ovarian cancer in the family. She believes one of her grandfathers died from lung cancer and another from pancreatic cancer, but is not sure   GYNECOLOGIC HISTORY:  Menarche age 65, first live birth age 55, which the patient understands is an independent risk factor for breast cancer. She is GX P3. She was still having regular periods at the start of chemotherapy. LMP was March 2015. She tells me Dr. Cletis Media checked her labs December 2015 and found that she was in the menopausal range. Note  that her husband is s/p vasectomy.    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 Her husband [name] is a Energy manager at [employer]. Her 3 children are [name] (19), [name] (15) and [name] (12). The patient attends a local [employer].    ADVANCED DIRECTIVES: Not in place   HEALTH MAINTENANCE: Social History   Tobacco Use  . Smoking status: Never Smoker  . Smokeless tobacco: Never Used  Substance Use Topics  . Alcohol use: No  . Drug use: No     Colonoscopy: January 2015  CNO:[id number] 2014  Bone density:  Lipid panel:  Allergies  Allergen Reactions  . Dust Mite Extract Itching    Current Outpatient Medications  Medication Sig Dispense Refill   . ALPRAZolam (XANAX) 0.5 MG tablet Take 20 minutes before MRI; may repeat once (Patient not taking: Reported on 04/21/2018) 5 tablet 0  . anastrozole (ARIMIDEX) 1 MG tablet Take 1 tablet (1 mg total) by mouth daily. 90 tablet 4  . cetirizine (ZYRTEC) 10 MG tablet Take 10 mg by mouth daily.    . Cholecalciferol (VITAMIN D3) 2000 units TABS Take by mouth daily.     . frovatriptan (FROVA) 2.5 MG tablet Take 1 tablet (2.5 mg total) by mouth as needed for migraine. If recurs, may repeat after 2 hours. Max of 3 tabs in 24 hours. 10 tablet 0  . Lancets (ONETOUCH DELICA PLUS [id number]) MISC     . omeprazole (PRILOSEC) 40 MG capsule Take 1 capsule (40 mg total) by mouth at bedtime. 60 capsule 4  . oxymetazoline (AFRIN) 0.05 % nasal spray Place 2 sprays into the nose daily as needed. congestion    . palbociclib (IBRANCE) 125 MG capsule TAKE 1 CAPSULE DAILY WITH FOOD FOR 3 WEEKS ON AND 1 WEEK OFF, REPEAT EVERY 4 WEEKS 21 capsule 13  . PARoxetine (PAXIL) 20 MG tablet Take 20 mg by mouth every morning.     No current facility-administered medications for this visit.     OBJECTIVE: Middle-aged white woman who appears well  There were no vitals filed for this visit.   There is no height or weight on file to calculate BMI.    ECOG FS:0 - Asymptomatic       Component Value Date/Time   NA 138 08/11/2018 1245   NA 139 04/28/2017 1326   K 4.1 08/11/2018 1245   K 4.1 04/28/2017 1326   CL 105 08/11/2018 1245   CO2 21 (L) 08/11/2018 1245   CO2 20 (L) 04/28/2017 1326   GLUCOSE 177 (H) 08/11/2018 1245   GLUCOSE 152 (H) 04/28/2017 1326   BUN 10 08/11/2018 1245   BUN 12.7 04/28/2017 1326   CREATININE 0.80 08/11/2018 1245   CREATININE 0.8 04/28/2017 1326   CALCIUM 9.7 08/11/2018 1245   CALCIUM 9.0 04/28/2017 1326   PROT 7.6 08/11/2018 1245   PROT 7.3 04/28/2017 1326   ALBUMIN 4.4 08/11/2018 1245   ALBUMIN 4.1 04/28/2017 1326   AST 54 (H) 08/11/2018 1245   AST 23 04/28/2017 1326   ALT 91 (H)  08/11/2018 1245   ALT 37 04/28/2017 1326   ALKPHOS 100 08/11/2018 1245   ALKPHOS 94 04/28/2017 1326   BILITOT 0.8 08/11/2018 1245   BILITOT 0.42 04/28/2017 1326   GFRNONAA >60 08/11/2018 1245   GFRAA >60 08/11/2018 1245    I No results found for: SPEP  Lab Results  Component Value Date   WBC 2.6 (L) 08/11/2018  NEUTROABS 1.1 (L) 08/11/2018   HGB 13.4 08/11/2018   HCT 37.8 08/11/2018   MCV 90.4 08/11/2018   PLT 314 08/11/2018      Chemistry      Component Value Date/Time   NA 138 08/11/2018 1245   NA 139 04/28/2017 1326   K 4.1 08/11/2018 1245   K 4.1 04/28/2017 1326   CL 105 08/11/2018 1245   CO2 21 (L) 08/11/2018 1245   CO2 20 (L) 04/28/2017 1326   BUN 10 08/11/2018 1245   BUN 12.7 04/28/2017 1326   CREATININE 0.80 08/11/2018 1245   CREATININE 0.8 04/28/2017 1326      Component Value Date/Time   CALCIUM 9.7 08/11/2018 1245   CALCIUM 9.0 04/28/2017 1326   ALKPHOS 100 08/11/2018 1245   ALKPHOS 94 04/28/2017 1326   AST 54 (H) 08/11/2018 1245   AST 23 04/28/2017 1326   ALT 91 (H) 08/11/2018 1245   ALT 37 04/28/2017 1326   BILITOT 0.8 08/11/2018 1245   BILITOT 0.42 04/28/2017 1326       No results found for: LABCA2  No components found for: LABCA125  No results for input(s): INR in the last 168 hours.  Urinalysis    Component Value Date/Time   BILIRUBINUR Negative 09/22/2011 1451   PROTEINUR Trace 09/22/2011 1451   UROBILINOGEN negative 09/22/2011 1451   NITRITE Negative 09/22/2011 1451   LEUKOCYTESUR Trace 09/22/2011 1451   CA 27-29  0.0 - 38.6 U/mL 27.9  26.8 CM 29.3 CM     STUDIES: Ct Chest W Contrast  Result Date: 10/05/2018 CLINICAL DATA:  Breast cancer metastases. EXAM: CT CHEST WITH CONTRAST TECHNIQUE: Multidetector CT imaging of the chest was performed during intravenous contrast administration. CONTRAST:  60m OMNIPAQUE IOHEXOL 300 MG/ML  SOLN COMPARISON:  PET-CT 04/19/2018.  Chest CT 01/25/2018. FINDINGS: Cardiovascular: The heart  size is normal. No substantial pericardial effusion. Mediastinum/Nodes: No mediastinal lymphadenopathy. There is no hilar lymphadenopathy. The esophagus has normal imaging features. There is no axillary lymphadenopathy. Lungs/Pleura: The central tracheobronchial airways are patent. No suspicious nodule or mass in the right lung. Post radiation scarring noted anterior left upper lobe. The plaque-like thickening along the posterior pleura of the upper left hemithorax has decreased since the prior chest CT of 01/25/2018 but is similar to PET-CT of 04/19/2018, measuring 3.5 x 0.6 cm today compared to 3.4 x 0.7 cm previously. Similar appearance of the small soft tissue lesion between the first and second ribs is identified on previous PET-CT. No new suspicious nodule or mass within the parenchyma of the left lung. There is no pleural effusion. Upper Abdomen: The liver shows diffusely decreased attenuation suggesting steatosis. Musculoskeletal: No worrisome lytic or sclerotic osseous abnormality. Scarring in the medial left breast is similar to prior. IMPRESSION: 1. Stable exam since 04/19/2018. No new or progressive interval findings. 2. The plaque-like thickening noted posterior left upper hemithorax is stable. 3. Hepatic steatosis Electronically Signed   By: EMisty StanleyM.D.   On: 10/05/2018 18:51     ASSESSMENT: 53y.o. [place] woman status post left breast lower inner quadrant and left axillary lymph node biopsy 06/30/2013 for a clinical T2 N1-2, stage IIB/IIIA invasive ductal carcinoma, grade 2, estrogen and progesterone receptors both strongly positive, HER-2 nonamplified, with an MIB-1 of 36%.  (1) there is a 5 mm left internal mammary lymph node which was negative on PET scan  (2) cyclophosphamide and doxorubicin started 08/04/2014, given in dose dense fashion x4 with Neulasta day 2; completed 09/14/2013, followed  by paclitaxel again given in dose dense fashion x4, completed 11/09/2013  (3) left  lumpectomy and axillary lymph node dissection 12/13/2013 at [facility] showed a residual pT2 pN1 (mic), stage IIB invasive ductal carcinoma; margins positive for DCIS cleared with subsequent surgery (01/03/2014)  (4) adjuvant radiation therapy completed 04/03/2014  (5) tamoxifen started December 2015, discontinued October 2018  (6) hepatic steatosis: documented on abdomianl US 08/07/2014, with elevated but stable LFTs,  (7) genetic testing 07/24/2013 through the  "Women's Hereditary Panel", performed at [facility], found no deleterious mutations in  ATM, BRCA1, BRCA2, BRIP1, CDH1, CHEK2 (c.1100delC only), EPCAM, MLH1, MSH2, MSH6, NBN, PALB2, PMS2, PTEN, RAD51C, STK11, and TP53.  (8) METASTATIC DISEASE: October 2018 -- chest CT scan 03/17/2017 shows left pleural thickening and a left pleural effusion; PET scan 03/24/2017 shows no liver or bone involvement and no involvement of the right lung.  (a) biopsy of the left pleural mass 04/06/2017 confirms metastatic breast cancer, estrogen and progesterone receptor positive, HER-2 negative  (b) CA 27-27 was 86.3 on 03/30/2017  (9) anastrozole started October 2018  (a) palbociclib added 04/12/2017 at 125 mg/day 21/7  (b) CT chest on 01/26/2018 shows improvement in nodular and plaque like pleural metastatic disease without complete resolution  (c) PET scan on 04/19/18 consistent with continued improvement of metastatic disease  (d) CT scan of the chest 10/05/2018 shows no active disease.  (e) palbociclib dose decreased to 100 mg because we are not checking patient's labs during the pandemic    PLAN: Akshitha is now a year and a half out from definitive diagnosis of metastatic breast cancer with no evidence of active disease.  This is very favorable.  She is tolerating the anastrozole and palbociclib remarkably well, with no side effects of consequence  She does have mild diarrhea at times.  She is managing it well and she knows to hydrate  herself when that occurs  We are not going to be checking her counts because of the current pandemic.  Just so she does not become significantly neutropenic I am going to drop the dose of Ibrance 100 mg.  I changed the orders today.  Otherwise she will see me again in August.  We will likely repeat a PET scan at the end of the year.    She knows to call for any issues that may develop before her next visit here.   , [name], MD  10/12/18 2:35 PM Medical Oncology and Hematology [facility] [address] [place], [place] [address] Tel. [phone]    Fax. [phone]   I, Wilburn Mylar, am acting as scribe for Dr. [name]. .  I, Lurline Del MD, have reviewed the above documentation for accuracy and completeness, and I agree with the above.

## 2018-10-11 NOTE — Telephone Encounter (Signed)
Left vm for pt to call scheduling dept re 10/12/18 appt to possibly convert office visit into a Webex mtg.

## 2018-10-11 NOTE — Telephone Encounter (Signed)
Called patient regarding upcoming Webex appointment, left patient a voicemail and e-mail has been sent.

## 2018-10-12 ENCOUNTER — Other Ambulatory Visit: Payer: Self-pay | Admitting: Pharmacist

## 2018-10-12 ENCOUNTER — Other Ambulatory Visit: Payer: BLUE CROSS/BLUE SHIELD

## 2018-10-12 ENCOUNTER — Inpatient Hospital Stay: Payer: BLUE CROSS/BLUE SHIELD | Attending: Oncology | Admitting: Oncology

## 2018-10-12 DIAGNOSIS — Z79811 Long term (current) use of aromatase inhibitors: Secondary | ICD-10-CM

## 2018-10-12 DIAGNOSIS — C50312 Malignant neoplasm of lower-inner quadrant of left female breast: Secondary | ICD-10-CM

## 2018-10-12 DIAGNOSIS — Z801 Family history of malignant neoplasm of trachea, bronchus and lung: Secondary | ICD-10-CM

## 2018-10-12 DIAGNOSIS — C7800 Secondary malignant neoplasm of unspecified lung: Secondary | ICD-10-CM | POA: Diagnosis not present

## 2018-10-12 DIAGNOSIS — Z803 Family history of malignant neoplasm of breast: Secondary | ICD-10-CM

## 2018-10-12 DIAGNOSIS — Z79899 Other long term (current) drug therapy: Secondary | ICD-10-CM

## 2018-10-12 DIAGNOSIS — K760 Fatty (change of) liver, not elsewhere classified: Secondary | ICD-10-CM

## 2018-10-12 DIAGNOSIS — Z800 Family history of malignant neoplasm of digestive organs: Secondary | ICD-10-CM

## 2018-10-12 DIAGNOSIS — Z170 Estrogen receptor positive status [ER+]: Secondary | ICD-10-CM | POA: Diagnosis not present

## 2018-10-12 DIAGNOSIS — Z8049 Family history of malignant neoplasm of other genital organs: Secondary | ICD-10-CM

## 2018-10-12 DIAGNOSIS — E119 Type 2 diabetes mellitus without complications: Secondary | ICD-10-CM

## 2018-10-12 DIAGNOSIS — Z923 Personal history of irradiation: Secondary | ICD-10-CM

## 2018-10-12 MED ORDER — PALBOCICLIB 125 MG PO CAPS: ORAL_CAPSULE | ORAL | 13 refills | Status: DC

## 2018-10-12 MED ORDER — ANASTROZOLE 1 MG PO TABS: 1.0000 mg | ORAL_TABLET | Freq: Every day | ORAL | 4 refills | Status: DC

## 2018-10-12 MED ORDER — PALBOCICLIB 100 MG PO CAPS: 100.0000 mg | ORAL_CAPSULE | Freq: Every day | ORAL | 6 refills | Status: DC

## 2018-10-12 NOTE — Telephone Encounter (Signed)
Oral Oncology Pharmacist Encounter  Received notification from the [facility] outpatient pharmacy that they received new Ibrance prescription for patient. She receives her [facility] from [facility] per insurance requirement.  Noted Ibrance dose reduction to 100mg  daily for 21d on, 7d off, due to COVID-19 pandemic and need to reduce clinic visits to check [place].  Prescription for Ibrance 100mg  capsules, take 1 capsule by mouth once daily for 21 days on, 7 days off, take with whole food, quantity #21, refills = 6, e-scribed to [facility].  Prescription sent to [facility] has ben cancelled.  Johny Drilling, PharmD, BCPS, BCOP  10/12/2018 3:00 PM Oral Oncology Clinic [phone]

## 2018-10-13 ENCOUNTER — Telehealth: Payer: Self-pay | Admitting: Oncology

## 2018-10-13 NOTE — Telephone Encounter (Signed)
Tried to reach regarding schedule °

## 2018-10-19 ENCOUNTER — Other Ambulatory Visit: Payer: Self-pay | Admitting: *Deleted

## 2018-10-19 DIAGNOSIS — C50312 Malignant neoplasm of lower-inner quadrant of left female breast: Secondary | ICD-10-CM

## 2018-10-19 MED ORDER — PALBOCICLIB 100 MG PO TABS: 100.0000 mg | ORAL_TABLET | Freq: Every day | ORAL | 3 refills | Status: DC

## 2018-10-21 ENCOUNTER — Encounter: Payer: Self-pay | Admitting: *Deleted

## 2018-11-01 ENCOUNTER — Other Ambulatory Visit: Payer: Self-pay | Admitting: *Deleted

## 2018-11-01 MED ORDER — ANASTROZOLE 1 MG PO TABS: 1.0000 mg | ORAL_TABLET | Freq: Every day | ORAL | 4 refills | Status: DC

## 2018-11-02 ENCOUNTER — Other Ambulatory Visit: Payer: Self-pay

## 2018-11-02 MED ORDER — ANASTROZOLE 1 MG PO TABS: 1.0000 mg | ORAL_TABLET | Freq: Every day | ORAL | 4 refills | Status: DC

## 2018-11-09 ENCOUNTER — Other Ambulatory Visit: Payer: Self-pay

## 2018-11-09 ENCOUNTER — Inpatient Hospital Stay: Payer: BC Managed Care – PPO | Attending: Oncology

## 2018-11-09 DIAGNOSIS — K760 Fatty (change of) liver, not elsewhere classified: Secondary | ICD-10-CM | POA: Diagnosis not present

## 2018-11-09 DIAGNOSIS — R197 Diarrhea, unspecified: Secondary | ICD-10-CM | POA: Insufficient documentation

## 2018-11-09 DIAGNOSIS — E7800 Pure hypercholesterolemia, unspecified: Secondary | ICD-10-CM

## 2018-11-09 DIAGNOSIS — C7800 Secondary malignant neoplasm of unspecified lung: Secondary | ICD-10-CM

## 2018-11-09 DIAGNOSIS — E119 Type 2 diabetes mellitus without complications: Secondary | ICD-10-CM

## 2018-11-09 DIAGNOSIS — C50312 Malignant neoplasm of lower-inner quadrant of left female breast: Secondary | ICD-10-CM | POA: Insufficient documentation

## 2018-11-09 LAB — COMPREHENSIVE METABOLIC PANEL
ALT: 51 U/L — ABNORMAL HIGH (ref 0–44)
AST: 27 U/L (ref 15–41)
Albumin: 4.1 g/dL (ref 3.5–5.0)
Alkaline Phosphatase: 99 U/L (ref 38–126)
Anion gap: 8 (ref 5–15)
BUN: 8 mg/dL (ref 6–20)
CO2: 24 mmol/L (ref 22–32)
Calcium: 9 mg/dL (ref 8.9–10.3)
Chloride: 106 mmol/L (ref 98–111)
Creatinine, Ser: 0.79 mg/dL (ref 0.44–1.00)
GFR calc Af Amer: >60 mL/min (ref >60)
GFR calc non Af Amer: >60 mL/min (ref >60)
Glucose, Bld: 179 mg/dL — ABNORMAL HIGH (ref 70–99)
Potassium: 4 mmol/L (ref 3.5–5.1)
Sodium: 138 mmol/L (ref 135–145)
Total Bilirubin: 0.4 mg/dL (ref 0.3–1.2)
Total Protein: 6.8 g/dL (ref 6.5–8.1)

## 2018-11-09 LAB — CBC WITH DIFFERENTIAL/PLATELET
Abs Immature Granulocytes: 0 10*3/uL (ref 0.00–0.07)
Basophils Absolute: 0 10*3/uL (ref 0.0–0.1)
Basophils Relative: 1 %
Eosinophils Absolute: 0.1 10*3/uL (ref 0.0–0.5)
Eosinophils Relative: 2 %
HCT: 36.8 % (ref 36.0–46.0)
Hemoglobin: 12.8 g/dL (ref 12.0–15.0)
Immature Granulocytes: 0 %
Lymphocytes Relative: 49 %
Lymphs Abs: 1.1 10*3/uL (ref 0.7–4.0)
MCH: 31.4 pg (ref 26.0–34.0)
MCHC: 34.8 g/dL (ref 30.0–36.0)
MCV: 90.4 fL (ref 80.0–100.0)
Monocytes Absolute: 0.2 10*3/uL (ref 0.1–1.0)
Monocytes Relative: 10 %
Neutro Abs: 0.8 10*3/uL — ABNORMAL LOW (ref 1.7–7.7)
Neutrophils Relative %: 38 %
Platelets: 212 10*3/uL (ref 150–400)
RBC: 4.07 MIL/uL (ref 3.87–5.11)
RDW: 13.3 % (ref 11.5–15.5)
WBC: 2.2 10*3/uL — ABNORMAL LOW (ref 4.0–10.5)
nRBC: 0 % (ref 0.0–0.2)

## 2018-11-09 LAB — HEMOGLOBIN A1C
Hgb A1c MFr Bld: 7.8 % — ABNORMAL HIGH (ref 4.8–5.6)
Mean Plasma Glucose: 177.16 mg/dL

## 2018-11-09 LAB — LIPID PANEL
Cholesterol: 206 mg/dL — ABNORMAL HIGH (ref 0–200)
HDL: 40 mg/dL — ABNORMAL LOW (ref >40)
LDL Cholesterol: 138 mg/dL — ABNORMAL HIGH (ref 0–99)
Total CHOL/HDL Ratio: 5.2 RATIO
Triglycerides: 141 mg/dL (ref <150)
VLDL: 28 mg/dL (ref 0–40)

## 2018-11-09 LAB — TSH: TSH: 4.014 u[IU]/mL — ABNORMAL HIGH (ref 0.308–3.960)

## 2018-11-10 LAB — CANCER ANTIGEN 27.29: CA 27.29: 27.8 U/mL (ref 0.0–38.6)

## 2018-12-07 ENCOUNTER — Other Ambulatory Visit: Payer: Self-pay | Admitting: Oncology

## 2018-12-07 DIAGNOSIS — R12 Heartburn: Secondary | ICD-10-CM

## 2018-12-07 DIAGNOSIS — C50312 Malignant neoplasm of lower-inner quadrant of left female breast: Secondary | ICD-10-CM

## 2018-12-26 DIAGNOSIS — C50912 Malignant neoplasm of unspecified site of left female breast: Secondary | ICD-10-CM | POA: Diagnosis not present

## 2019-01-17 NOTE — Telephone Encounter (Signed)
Note to close encounter.  

## 2019-01-23 NOTE — Progress Notes (Signed)
[facility]  [phone] [phone]    ID: Debbie [name] OB: 1965-08-18  MR#: [id number]  ZDG#:[id number]  Patient Care Team: Harlan Stains, MD as PCP - General (Family Medicine) Magrinat, [name], MD as Consulting Physician (Oncology) [name], MD as Referring Physician (Surgery) [name], MD as Consulting Physician (Obstetrics and Gynecology) [name], MD as Consulting Physician (General Surgery) Kyung Dresch, MD as Consulting Physician (Radiation Oncology) [name], [name], MD as Consulting Physician (Allergy and Immunology) [name], MD as Consulting Physician (Gastroenterology) [name], MD as Referring Physician (Internal Medicine) [name], [name], MD as Referring Physician (Oncology) OTHER MD:    CHIEF COMPLAINT: 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 She was last seen here on 10/12/2018.   She continues on anastrozole.  Hot flashes and vaginal dryness are not a major issue for her.  She is able to obtain adequate dose.  She also continues on palbociclib.  He does not have nausea or fatigue associated with this drug  There is no bone density screening on file.   Since her last visit here, she underwent a chest CT with contrast on 10/05/2018 showing: Stable exam since 04/19/2018. No new or progressive interval findings. The plaque-like thickening noted posterior left upper hemithorax is stable. Hepatic steatosis.  Her last restaging PET was on 04/19/2018.    Lab Results  Component Value Date   CA2729 27.8 11/09/2018   CA2729 24.3 07/14/2018   CA2729 27.3 06/24/2018   CA2729 24.8 05/19/2018   CA2729 22.6 04/21/2018    REVIEW OF SYSTEMS: Debbie [name] is not exercising regularly and her A1c when  we last checked it was over 7.  We discussed diet and exercise issues extensively today from a breast cancer point of view however she is doing well.  A detailed review of systems was otherwise not contributory  BREAST CANCER HISTORY: As per previously documented note:  Debbie [name] had routine yearly screening mammography at the breast center 06/19/2013 showing a possible mass in the left breast. The right breast was unremarkable. On 06/30/2013 additional views of the left breast showed an irregular spiculated dense mass measuring 2.2 cm in the lower inner quadrant. This was palpable at the 8:00 location. Ultrasound showed an irregular hypoechoic mass measuring 2.3 cm and in the left axilla a dominant abnormal appearing lymph node with cortical thickening measuring 1.2 cm.  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  Subsequently the patient underwent bilateral breast MRI. This showed her breasts to be dense (composition C.). In the left breast at the 8:00 position there was a 2.4 cm enhancing mass containing a biopsy clip. There were no additional areas of concern. In the axilla on the left there was a 2.6 cm enlarged left axillary lymph node. There was also a 0.5 cm left internal mammary lymph nodes noted. The right side was unremarkable.  The patient's subsequent history is as detailed below.   PAST MEDICAL HISTORY: Past Medical History:  Diagnosis Date  .  Abnormal Pap smear   . Anxiety   . Breast cancer ([place]) 2015   ER+/PR+/Her2- Left - Dr. Earlie Lou  . GERD (gastroesophageal reflux disease)    occasional  . Hyperlipidemia     no meds   . IBS (irritable bowel syndrome)   . IBS (irritable bowel syndrome)   . Menorrhagia   . Migraines    menstral  . Migraines   . MVP (mitral valve prolapse)    echo 2/15-mitral valve normal-no regurg,no stenosis  . Pre-diabetes    no meds  . Radiation 02/14/14-04/03/14   Left Breast  . Seasonal allergies   . SVD (spontaneous vaginal delivery)    x 3    PAST SURGICAL HISTORY: Past Surgical History:  Procedure Laterality Date  . BREAST SURGERY Left 2015   lumpectomy x 2 - Dr. Jana Hakim  . COLONOSCOPY    . DILATATION & CURRETTAGE/HYSTEROSCOPY WITH RESECTOCOPE N/A 09/14/2017   Procedure: DILATATION & CURETTAGE/HYSTEROSCOPY WITH RESECTOCOPE;  Surgeon: [name], MD;  Location: [facility];  Service: Gynecology;  Laterality: N/A;  . ENDOMETRIAL ABLATION  2013  . PORT-A-CATH REMOVAL N/A 05/15/2014   Procedure: REMOVAL PORT-A-CATH;  Surgeon: [name], MD;  Location: [facility];  Service: General;  Laterality: N/A;  . PORTACATH PLACEMENT Left 07/18/2013   Procedure: INSERTION PORT-A-CATH;  Surgeon: [name], MD;  Location: [facility];  Service: General;  Laterality: Left;  . SVD      x 3  . VULVA /PERINEUM BIOPSY  2011  . WISDOM TEETH REMOVAL      FAMILY HISTORY Family History  Problem Relation Age of Onset  . Cancer Maternal Grandmother        BREAST AND UTERINE CANCER  . Uterine cancer Maternal Grandmother 84  . Hyperlipidemia Mother   . Hypertension Mother   . Multiple myeloma Father 47  . Lung cancer Maternal Manufacturing engineer  . Testicular cancer Other 3   The patient's father is living, currently age 91. He has a history of multiple myeloma. The patient's mother is 15 and has Parkinson's.  She was diagnosed with ductal carcinoma in situ in 2016. The patient's parents live in [place], [place]. The patient had no brothers, 2 sisters. There is no history of breast or ovarian cancer in the family. She believes one of her grandfathers died  from lung cancer and another from pancreatic cancer, but is not sure   GYNECOLOGIC HISTORY:  Menarche age 71, first live birth age 57, which the patient understands is an independent risk factor for breast cancer. She is GX P3. She was still having regular periods at the start of chemotherapy. LMP was March 2015. She tells me Dr. Cletis Media checked her labs December 2015 and found that she was in the menopausal range. Note that her husband is s/p vasectomy.    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 Her husband [name] is a Energy manager at [employer]. Her 3 children are [name] (19), [name] (15) and [name] (12). The patient attends a local [employer].    ADVANCED DIRECTIVES: Not in place   HEALTH MAINTENANCE: Social History   Tobacco Use  . Smoking status: Never Smoker  . Smokeless tobacco: Never Used  Substance Use Topics  . Alcohol use: No  . Drug use: No     Colonoscopy: January 2015  GNF:AOZHYQMV 2014  Bone density:  Lipid panel:  Allergies  Allergen Reactions  . Dust Mite Extract Itching    Current Outpatient Medications  Medication Sig Dispense Refill  . ALPRAZolam (XANAX) 0.5 MG tablet Take 20 minutes before MRI; may repeat once (Patient not taking: Reported on 04/21/2018) 5 tablet 0  . anastrozole (ARIMIDEX) 1 MG tablet Take 1 tablet (1 mg total) by mouth daily. 90 tablet 4  . cetirizine (ZYRTEC) 10 MG tablet Take 10 mg by mouth daily.    . Cholecalciferol (VITAMIN D3) 2000 units TABS Take by mouth daily.     . frovatriptan (FROVA) 2.5 MG tablet Take 1 tablet (2.5 mg total) by mouth as needed for migraine. If recurs, may repeat after 2 hours. Max of 3 tabs in 24 hours. 10 tablet 0  . Lancets (ONETOUCH DELICA PLUS [id number]) MISC     . omeprazole (PRILOSEC) 40 MG capsule TAKE ONE CAPSULE BY MOUTH DAILY AT BEDTIME  60 capsule 0  . oxymetazoline (AFRIN) 0.05 % nasal spray Place 2 sprays  into the nose daily as needed. congestion    . palbociclib (IBRANCE) 100 MG tablet Take 1 tablet (100 mg total) by mouth daily. Take for 21 days on, 7 days off, repeat every 28 days. 21 tablet 3  . PARoxetine (PAXIL) 20 MG tablet Take 20 mg by mouth every morning.     No current facility-administered medications for this visit.     OBJECTIVE: Middle-aged white woman in no acute distress  Vitals:   01/24/19 1419  BP: 129/84  Pulse: 87  Resp: 18  Temp: 97.8 F (36.6 C)  SpO2: 98%    Wt Readings from Last 3 Encounters:  01/24/19 211 lb 9 oz (96 kg)  07/14/18 217 lb (98.4 kg)  04/21/18 217 lb (98.4 kg)   Body mass index is 36.31 kg/m.      ECOG FS:1 - Symptomatic but completely ambulatory   Sclerae unicteric, EOMs intact Wearing a mask No cervical or supraclavicular adenopathy Lungs no rales or rhonchi Heart regular rate and rhythm Abd soft, nontender, positive bowel sounds MSK no focal spinal tenderness, no upper extremity lymphedema Neuro: nonfocal, well oriented, appropriate affect Breasts: The right breast is benign.  The left breast is status post lumpectomy and radiation with no evidence of disease recurrence.  This is very favorable.  Both axilla are benign.  LABS:     Component Value Date/Time   NA 139 01/24/2019 1404   NA 139 04/28/2017 1326   K 3.8 01/24/2019 1404   K 4.1 04/28/2017 1326   CL 105 01/24/2019 1404   CO2 22 01/24/2019 1404   CO2 20 (L) 04/28/2017 1326   GLUCOSE 237 (H) 01/24/2019 1404   GLUCOSE 152 (H) 04/28/2017 1326   BUN 10 01/24/2019 1404   BUN 12.7 04/28/2017 1326   CREATININE 0.86 01/24/2019 1404   CREATININE 0.8 04/28/2017 1326   CALCIUM 9.2 01/24/2019 1404   CALCIUM 9.0 04/28/2017 1326   PROT 6.8 01/24/2019 1404   PROT 7.3 04/28/2017 1326   ALBUMIN 4.1 01/24/2019 1404   ALBUMIN 4.1 04/28/2017 1326   AST 37 01/24/2019 1404   AST 23 04/28/2017 1326   ALT 74 (H) 01/24/2019 1404   ALT 37 04/28/2017 1326   ALKPHOS 103 01/24/2019  1404   ALKPHOS 94 04/28/2017 1326   BILITOT 0.7 01/24/2019 1404   BILITOT 0.42 04/28/2017 1326   GFRNONAA >60 01/24/2019 1404   GFRAA >60 01/24/2019 1404    I No results found for: SPEP  Lab Results  Component Value  Date   WBC 2.7 (L) 01/24/2019   NEUTROABS 1.2 (L) 01/24/2019   HGB 12.8 01/24/2019   HCT 36.6 01/24/2019   MCV 89.7 01/24/2019   PLT 314 01/24/2019      Chemistry      Component Value Date/Time   NA 139 01/24/2019 1404   NA 139 04/28/2017 1326   K 3.8 01/24/2019 1404   K 4.1 04/28/2017 1326   CL 105 01/24/2019 1404   CO2 22 01/24/2019 1404   CO2 20 (L) 04/28/2017 1326   BUN 10 01/24/2019 1404   BUN 12.7 04/28/2017 1326   CREATININE 0.86 01/24/2019 1404   CREATININE 0.8 04/28/2017 1326      Component Value Date/Time   CALCIUM 9.2 01/24/2019 1404   CALCIUM 9.0 04/28/2017 1326   ALKPHOS 103 01/24/2019 1404   ALKPHOS 94 04/28/2017 1326   AST 37 01/24/2019 1404   AST 23 04/28/2017 1326   ALT 74 (H) 01/24/2019 1404   ALT 37 04/28/2017 1326   BILITOT 0.7 01/24/2019 1404   BILITOT 0.42 04/28/2017 1326       No results found for: LABCA2  No components found for: LABCA125  No results for input(s): INR in the last 168 hours.  Urinalysis    Component Value Date/Time   BILIRUBINUR Negative 09/22/2011 1451   PROTEINUR Trace 09/22/2011 1451   UROBILINOGEN negative 09/22/2011 1451   NITRITE Negative 09/22/2011 1451   LEUKOCYTESUR Trace 09/22/2011 1451    Lab Results  Component Value Date   CA2729 27.8 11/09/2018   CA2729 24.3 07/14/2018   CA2729 27.3 06/24/2018   CA2729 24.8 05/19/2018   CA2729 22.6 04/21/2018    STUDIES: No results found.  ASSESSMENT: 53 y.o.  woman status post left breast lower inner quadrant and left axillary lymph node biopsy 06/30/2013 for a clinical T2 N1-2, stage IIB/IIIA invasive ductal carcinoma, grade 2, estrogen and progesterone receptors both strongly positive, HER-2 nonamplified, with an MIB-1 of 36%.   (1) there is a 5 mm left internal mammary lymph node which was negative on PET scan  (2) cyclophosphamide and doxorubicin started 08/04/2014, given in dose dense fashion x4 with Neulasta day 2; completed 09/14/2013, followed by paclitaxel again given in dose dense fashion x4, completed 11/09/2013  (3) left lumpectomy and axillary lymph node dissection 12/13/2013 at [facility] showed a residual pT2 pN1 (mic), stage IIB invasive ductal carcinoma; margins positive for DCIS cleared with subsequent surgery (01/03/2014)  (4) adjuvant radiation therapy completed 04/03/2014  (5) tamoxifen started December 2015, discontinued October 2018  (6) hepatic steatosis: documented on abdomianl US 08/07/2014, with elevated but stable LFTs,  (7) genetic testing 07/24/2013 through the  "Women's Hereditary Panel", performed at [facility], found no deleterious mutations in  ATM, BRCA1, BRCA2, BRIP1, CDH1, CHEK2 (c.1100delC only), EPCAM, MLH1, MSH2, MSH6, NBN, PALB2, PMS2, PTEN, RAD51C, STK11, and TP53.  (8) METASTATIC DISEASE: October 2018 -- chest CT scan 03/17/2017 shows left pleural thickening and a left pleural effusion; PET scan 03/24/2017 shows no liver or bone involvement and no involvement of the right lung.  (a) biopsy of the left pleural mass 04/06/2017 confirms metastatic breast cancer, estrogen and progesterone receptor positive, HER-2 negative  (b) CA 27-27 was 86.3 on 03/30/2017  (9) anastrozole started October 2018  (a) palbociclib added 04/12/2017 at 125 mg/day 21/7  (b) CT chest on 01/26/2018 shows improvement in nodular and plaque like pleural metastatic disease without complete resolution  (c) PET scan on 04/19/18 consistent with continued improvement of metastatic  disease  (d) CT scan of the chest 10/05/2018 shows no active disease.  (e) palbociclib dose decreased to 100 mg because we are checking patient's labs less frequently during the pandemic    PLAN: Debbie [name] is now almost 2  years out from definitive diagnosis of metastatic breast cancer.  The disease appears to be well controlled and she is tolerating her treatment well.  We are going to restage her with a PET scan in November.  Meanwhile we are continuing the palbociclib and the anastrozole.  We are not doing monthly labs to minimize exposure to the pandemic.  I did discuss diet and exercise issues with her and we will repeat a hemoglobin A1c when she returns in November  She is considering going back to work.  She is however more immunocompromised in most cancer patients not only because of the cancer itself but because of the palbociclib.  She could work from home that would be ideal.  She knows to call for any other issue that may develop before then.     Magrinat, [name], MD  01/24/19 2:48 PM Medical Oncology and Hematology [facility] [address] [place], [place] [address] Tel. [phone]    Fax. [phone]  I, Jacqualyn Posey am acting as a Education administrator for Chauncey Cruel, MD.   I, Lurline Del MD, have reviewed the above documentation for accuracy and completeness, and I agree with the above.

## 2019-01-24 ENCOUNTER — Inpatient Hospital Stay: Payer: BC Managed Care – PPO

## 2019-01-24 ENCOUNTER — Other Ambulatory Visit: Payer: Self-pay

## 2019-01-24 ENCOUNTER — Inpatient Hospital Stay: Payer: BC Managed Care – PPO | Attending: Oncology | Admitting: Oncology

## 2019-01-24 VITALS — BP 129/84 | HR 87 | Temp 97.8°F | Resp 18 | Ht 64.0 in | Wt 211.6 lb

## 2019-01-24 DIAGNOSIS — K589 Irritable bowel syndrome without diarrhea: Secondary | ICD-10-CM | POA: Diagnosis not present

## 2019-01-24 DIAGNOSIS — F419 Anxiety disorder, unspecified: Secondary | ICD-10-CM | POA: Diagnosis not present

## 2019-01-24 DIAGNOSIS — Z923 Personal history of irradiation: Secondary | ICD-10-CM | POA: Insufficient documentation

## 2019-01-24 DIAGNOSIS — C773 Secondary and unspecified malignant neoplasm of axilla and upper limb lymph nodes: Secondary | ICD-10-CM | POA: Insufficient documentation

## 2019-01-24 DIAGNOSIS — Z79899 Other long term (current) drug therapy: Secondary | ICD-10-CM | POA: Diagnosis not present

## 2019-01-24 DIAGNOSIS — K219 Gastro-esophageal reflux disease without esophagitis: Secondary | ICD-10-CM | POA: Diagnosis not present

## 2019-01-24 DIAGNOSIS — Z79811 Long term (current) use of aromatase inhibitors: Secondary | ICD-10-CM | POA: Diagnosis not present

## 2019-01-24 DIAGNOSIS — Z170 Estrogen receptor positive status [ER+]: Secondary | ICD-10-CM | POA: Diagnosis not present

## 2019-01-24 DIAGNOSIS — K760 Fatty (change of) liver, not elsewhere classified: Secondary | ICD-10-CM | POA: Insufficient documentation

## 2019-01-24 DIAGNOSIS — C50312 Malignant neoplasm of lower-inner quadrant of left female breast: Secondary | ICD-10-CM | POA: Diagnosis not present

## 2019-01-24 DIAGNOSIS — C7800 Secondary malignant neoplasm of unspecified lung: Secondary | ICD-10-CM

## 2019-01-24 DIAGNOSIS — I341 Nonrheumatic mitral (valve) prolapse: Secondary | ICD-10-CM | POA: Diagnosis not present

## 2019-01-24 DIAGNOSIS — E785 Hyperlipidemia, unspecified: Secondary | ICD-10-CM | POA: Diagnosis not present

## 2019-01-24 LAB — COMPREHENSIVE METABOLIC PANEL
ALT: 74 U/L — ABNORMAL HIGH (ref 0–44)
AST: 37 U/L (ref 15–41)
Albumin: 4.1 g/dL (ref 3.5–5.0)
Alkaline Phosphatase: 103 U/L (ref 38–126)
Anion gap: 12 (ref 5–15)
BUN: 10 mg/dL (ref 6–20)
CO2: 22 mmol/L (ref 22–32)
Calcium: 9.2 mg/dL (ref 8.9–10.3)
Chloride: 105 mmol/L (ref 98–111)
Creatinine, Ser: 0.86 mg/dL (ref 0.44–1.00)
GFR calc Af Amer: >60 mL/min (ref >60)
GFR calc non Af Amer: >60 mL/min (ref >60)
Glucose, Bld: 237 mg/dL — ABNORMAL HIGH (ref 70–99)
Potassium: 3.8 mmol/L (ref 3.5–5.1)
Sodium: 139 mmol/L (ref 135–145)
Total Bilirubin: 0.7 mg/dL (ref 0.3–1.2)
Total Protein: 6.8 g/dL (ref 6.5–8.1)

## 2019-01-24 LAB — CBC WITH DIFFERENTIAL/PLATELET
Abs Immature Granulocytes: 0.01 10*3/uL (ref 0.00–0.07)
Basophils Absolute: 0 10*3/uL (ref 0.0–0.1)
Basophils Relative: 0 %
Eosinophils Absolute: 0.1 10*3/uL (ref 0.0–0.5)
Eosinophils Relative: 5 %
HCT: 36.6 % (ref 36.0–46.0)
Hemoglobin: 12.8 g/dL (ref 12.0–15.0)
Immature Granulocytes: 0 %
Lymphocytes Relative: 42 %
Lymphs Abs: 1.1 10*3/uL (ref 0.7–4.0)
MCH: 31.4 pg (ref 26.0–34.0)
MCHC: 35 g/dL (ref 30.0–36.0)
MCV: 89.7 fL (ref 80.0–100.0)
Monocytes Absolute: 0.2 10*3/uL (ref 0.1–1.0)
Monocytes Relative: 9 %
Neutro Abs: 1.2 10*3/uL — ABNORMAL LOW (ref 1.7–7.7)
Neutrophils Relative %: 44 %
Platelets: 314 10*3/uL (ref 150–400)
RBC: 4.08 MIL/uL (ref 3.87–5.11)
RDW: 12.7 % (ref 11.5–15.5)
WBC: 2.7 10*3/uL — ABNORMAL LOW (ref 4.0–10.5)
nRBC: 0 % (ref 0.0–0.2)

## 2019-01-25 ENCOUNTER — Telehealth: Payer: Self-pay | Admitting: Oncology

## 2019-01-25 LAB — CANCER ANTIGEN 27.29: CA 27.29: 17.9 U/mL (ref 0.0–38.6)

## 2019-01-25 NOTE — Telephone Encounter (Signed)
I left a message regarding schedule I will mail °

## 2019-02-08 ENCOUNTER — Other Ambulatory Visit: Payer: Self-pay | Admitting: Oncology

## 2019-02-11 ENCOUNTER — Other Ambulatory Visit: Payer: Self-pay | Admitting: Oncology

## 2019-02-21 ENCOUNTER — Telehealth: Payer: Self-pay | Admitting: *Deleted

## 2019-02-21 NOTE — Telephone Encounter (Signed)
Received a call from pt stating her son that lives with her had direct exposure riding in a car over the weekend with someone that tested positive for Covid.  RN educated pt to self quarantine herself and her family for two weeks which includes her son not going to school for the next two weeks. RN educated pt on signs and symptoms to monitor for and also educated pt on community testing available at [facility].  Pt verbalized understanding and appreciate of the help.

## 2019-04-12 ENCOUNTER — Encounter (HOSPITAL_COMMUNITY)
Admission: RE | Admit: 2019-04-12 | Discharge: 2019-04-12 | Disposition: A | Discharge status: Home / Self Care | Payer: BC Managed Care – PPO | Source: Ambulatory Visit | Attending: Oncology | Admitting: Oncology

## 2019-04-12 ENCOUNTER — Other Ambulatory Visit: Payer: Self-pay

## 2019-04-12 DIAGNOSIS — Z170 Estrogen receptor positive status [ER+]: Secondary | ICD-10-CM | POA: Insufficient documentation

## 2019-04-12 DIAGNOSIS — C50312 Malignant neoplasm of lower-inner quadrant of left female breast: Secondary | ICD-10-CM | POA: Diagnosis not present

## 2019-04-12 DIAGNOSIS — C7800 Secondary malignant neoplasm of unspecified lung: Secondary | ICD-10-CM | POA: Insufficient documentation

## 2019-04-12 DIAGNOSIS — C50912 Malignant neoplasm of unspecified site of left female breast: Secondary | ICD-10-CM | POA: Diagnosis not present

## 2019-04-12 LAB — GLUCOSE, CAPILLARY: Glucose-Capillary: 171 mg/dL — ABNORMAL HIGH (ref 70–99)

## 2019-04-12 MED ORDER — FLUDEOXYGLUCOSE F - 18 (FDG) INJECTION
10.4400 | Freq: Once | INTRAVENOUS | Status: AC | PRN
  Administered 2019-04-12: 10.44 via INTRAVENOUS

## 2019-04-16 NOTE — Progress Notes (Signed)
[place]  Telephone:([phone]) [phone] [phone]    ID: [name] OB: 1965-11-18  MR#: [id number]  JGG#:[id number]  Patient Care Team: Harlan Stains, MD as PCP - General (Family Medicine) Salathiel Ferrara, [name], MD as Consulting Physician (Oncology) [name], MD as Referring Physician (Surgery) [name], MD as Consulting Physician (Obstetrics and Gynecology) [name], MD as Consulting Physician (General Surgery) Kyung Robenson, MD as Consulting Physician (Radiation Oncology) [name], [name], MD as Consulting Physician (Allergy and Immunology) [name], MD as Consulting Physician (Gastroenterology) [name], MD as Referring Physician (Internal Medicine) [name], [name], MD as Referring Physician (Oncology) OTHER MD:    CHIEF COMPLAINT: locally advanced estrogen receptor positive breast cancer  CURRENT TREATMENT:  Anastrozole, palbociclib   INTERVAL HISTORY: Newell returns today for follow-up of her estrogen receptor positive metastatic breast cancer.   She continues on anastrozole.  At this point she is tolerating it well.  She is not having significant problems with hot flashes or vaginal dryness that she is aware of.  She also continues on palbociclib.  She feels a little tired and this may be due to the drug but she also is not sleeping.  She goes to bed may be at 11 and does not fall asleep until 2.  Then she has a hard time getting out of bed in the morning.  She gets out of bed in a very variable time anytime from 8-11.  There is no bone density screening on file.  She gets her mammograms through Dr. Boyd Kerbs office but does not recall having a bone scan there.  Since her last visit, she underwent repeat PET scan on 04/12/2019, which showed: reduced thickness of pleural thickening on the left, also with proportionally reduced activity compared to the blood pool, no new pleural lesions; diffuse hepatic  steatosis; very faintly accentuated activity in the T9 vertebral body without correlative CT findings.   Lab Results  Component Value Date   CA2729 17.9 01/24/2019   CA2729 27.8 11/09/2018   CA2729 24.3 07/14/2018   CA2729 27.3 06/24/2018   CA2729 24.8 05/19/2018    REVIEW OF SYSTEMS: Marytza denies unusual headaches, though she does have migraines, which are now more frequent or intense than usual.  She denies visual changes nausea or vomiting.  There have been no problems with balance or falls.  She denies cough phlegm production or pleurisy.  She denies any pain, rash, bleeding, or fever.  There has been no change in bowel or bladder habits.  Detailed review of systems is otherwise stable  BREAST CANCER HISTORY: As per previously documented note:  Orissa had routine yearly screening mammography at the breast center 06/19/2013 showing a possible mass in the left breast. The right breast was unremarkable. On 06/30/2013 additional views of the left breast showed an irregular spiculated dense mass measuring 2.2 cm in the lower inner quadrant. This was palpable at the 8:00 location. Ultrasound showed an irregular hypoechoic mass measuring 2.3 cm and in the left axilla a dominant abnormal appearing lymph node with cortical thickening measuring 1.2 cm.  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  Subsequently the patient underwent bilateral breast MRI. This showed her breasts to be dense (composition C.). In the left breast at the 8:00 position there was a 2.4 cm enhancing mass containing a biopsy clip. There were no additional areas of concern.  In the axilla on the left there was a 2.6 cm enlarged left axillary lymph node. There was also a 0.5 cm left internal mammary lymph nodes noted. The right side was unremarkable.  The patient's subsequent history is as detailed below.   PAST MEDICAL HISTORY: Past Medical History:  Diagnosis Date   Abnormal Pap smear    Anxiety    Breast cancer ([place]) 2015   ER+/PR+/Her2- Left - Dr. Earlie Lou   GERD (gastroesophageal reflux disease)    occasional   Hyperlipidemia    no meds    IBS (irritable bowel syndrome)    IBS (irritable bowel syndrome)    Menorrhagia    Migraines    menstral   Migraines    MVP (mitral valve prolapse)    echo 2/15-mitral valve normal-no regurg,no stenosis   Pre-diabetes    no meds   Radiation 02/14/14-04/03/14   Left Breast   Seasonal allergies    SVD (spontaneous vaginal delivery)    x 3    PAST SURGICAL HISTORY: Past Surgical History:  Procedure Laterality Date   BREAST SURGERY Left 2015   lumpectomy x 2 - Dr. Jana Hakim   COLONOSCOPY     DILATATION & CURRETTAGE/HYSTEROSCOPY WITH RESECTOCOPE N/A 09/14/2017   Procedure: DILATATION & CURETTAGE/HYSTEROSCOPY WITH RESECTOCOPE;  Surgeon: [name], MD;  Location: [facility];  Service: Gynecology;  Laterality: N/A;   ENDOMETRIAL ABLATION  2013   PORT-A-CATH REMOVAL N/A 05/15/2014   Procedure: REMOVAL PORT-A-CATH;  Surgeon: [name], MD;  Location: [facility];  Service: General;  Laterality: N/A;   PORTACATH PLACEMENT Left 07/18/2013   Procedure: INSERTION PORT-A-CATH;  Surgeon: [name], MD;  Location: [facility];  Service: General;  Laterality: Left;   SVD      x 3   VULVA /PERINEUM BIOPSY  2011   WISDOM TEETH REMOVAL      FAMILY HISTORY Family History  Problem Relation Age of Onset   Cancer Maternal Grandmother        BREAST AND UTERINE CANCER   Uterine cancer Maternal Grandmother 41   Hyperlipidemia Mother    Hypertension Mother     Multiple myeloma Father 53   Lung cancer Maternal Grandfather        welder   Testicular cancer Other 102   The patient's father is living, currently age 78 (as of November 2020).. He has a history of multiple myeloma. The patient's mother is 74 and has Parkinson's.  She was diagnosed with ductal carcinoma in situ in 2016. The patient's parents live in [place], [place]. The patient had no brothers, 2 sisters. There is no history of breast or ovarian cancer in the family. She believes one of her grandfathers died from lung cancer and another from pancreatic cancer, but is not sure   GYNECOLOGIC HISTORY:  Menarche age 67, first live birth age 28, which the patient understands is an independent risk factor for breast cancer. She is GX P3. She was still having regular periods at the start of chemotherapy. LMP was March 2015. She tells me Dr. Cletis Media checked her labs December 2015 and found that she was in the menopausal range. Note that her husband is s/p vasectomy.    SOCIAL HISTORY: (updated November 2020).  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 Her 3 children are [name] (20), [name] (16) and [name] (13). The patient attends a local [employer].    ADVANCED DIRECTIVES: Not in place   HEALTH MAINTENANCE: Social History   Tobacco Use   Smoking status: Never Smoker   Smokeless tobacco: Never Used  Substance Use Topics   Alcohol use: No   Drug use: No     Colonoscopy: January 2015  OVF:IEPPIRJJ 2014  Bone density:  Lipid panel:  Allergies  Allergen Reactions   Dust Mite Extract Itching    Current Outpatient Medications  Medication Sig Dispense Refill   ALPRAZolam (XANAX) 0.5 MG tablet Take 20 minutes before MRI; may repeat once (Patient not taking: Reported on 04/21/2018) 5 tablet 0   anastrozole  (ARIMIDEX) 1 MG tablet Take 1 tablet (1 mg total) by mouth daily. 90 tablet 4   cetirizine (ZYRTEC) 10 MG tablet Take 10 mg by mouth daily.     Cholecalciferol (VITAMIN D3) 2000 units TABS Take by mouth daily.      frovatriptan (FROVA) 2.5 MG tablet Take 1 tablet (2.5 mg total) by mouth as needed for migraine. If recurs, may repeat after 2 hours. Max of 3 tabs in 24 hours. 10 tablet 0   IBRANCE 100 MG tablet TAKE 1 TABLET DAILY. TAKE FOR 21 DAYS ON, 7 DAYS OFF, REPEAT EVERY 28 DAYS 21 tablet 12   Lancets (ONETOUCH DELICA PLUS OACZYS06T) MISC      omeprazole (PRILOSEC) 40 MG capsule TAKE ONE CAPSULE BY MOUTH DAILY AT BEDTIME  60 capsule 0   oxymetazoline (AFRIN) 0.05 % nasal spray Place 2 sprays into the nose daily as needed. congestion     PARoxetine (PAXIL) 20 MG tablet Take 20 mg by mouth every morning.     No current facility-administered medications for this visit.     OBJECTIVE: Middle-aged white woman who appears well  Vitals:   04/17/19 1449  BP: 122/90  Pulse: 99  Resp: 18  Temp: 97.8 F (36.6 C)  SpO2: 99%    Wt Readings from Last 3 Encounters:  04/17/19 205 lb 9.6 oz (93.3 kg)  01/24/19 211 lb 9 oz (96 kg)  07/14/18 217 lb (98.4 kg)   Body mass index is 35.29 kg/m.      ECOG FS:1 - Symptomatic but completely ambulatory   Sclerae unicteric, EOMs intact Wearing a mask No cervical or supraclavicular adenopathy Lungs no rales or rhonchi Heart regular rate and rhythm Abd soft, nontender, positive bowel sounds MSK no focal spinal tenderness, no upper extremity lymphedema Neuro: nonfocal, well oriented, appropriate affect Breasts: The right breast is unremarkable.  Left breast has undergone lumpectomy and radiation.  There is no evidence of local recurrence.  Both axillae are benign.   LABS:     Component Value Date/Time   NA 139 04/17/2019 1441   NA 139 04/28/2017 1326   K 4.4 04/17/2019 1441   K 4.1 04/28/2017 1326   CL 102 04/17/2019 1441   CO2 24  04/17/2019 1441   CO2 20 (L) 04/28/2017 1326   GLUCOSE 217 (H) 04/17/2019 1441   GLUCOSE 152 (H) 04/28/2017 1326   BUN 10 04/17/2019 1441   BUN 12.7 04/28/2017 1326   CREATININE 0.98 04/17/2019 1441   CREATININE 0.8 04/28/2017 1326   CALCIUM 9.9 04/17/2019 1441  CALCIUM 9.0 04/28/2017 1326   PROT 7.5 04/17/2019 1441   PROT 7.3 04/28/2017 1326   ALBUMIN 4.3 04/17/2019 1441   ALBUMIN 4.1 04/28/2017 1326   AST 72 (H) 04/17/2019 1441   AST 23 04/28/2017 1326   ALT 128 (H) 04/17/2019 1441   ALT 37 04/28/2017 1326   ALKPHOS 117 04/17/2019 1441   ALKPHOS 94 04/28/2017 1326   BILITOT 0.9 04/17/2019 1441   BILITOT 0.42 04/28/2017 1326   GFRNONAA >60 04/17/2019 1441   GFRAA >60 04/17/2019 1441    I No results found for: SPEP  Lab Results  Component Value Date   WBC 3.2 (L) 04/17/2019   NEUTROABS 2.0 04/17/2019   HGB 14.0 04/17/2019   HCT 40.3 04/17/2019   MCV 89.0 04/17/2019   PLT 339 04/17/2019      Chemistry      Component Value Date/Time   NA 139 04/17/2019 1441   NA 139 04/28/2017 1326   K 4.4 04/17/2019 1441   K 4.1 04/28/2017 1326   CL 102 04/17/2019 1441   CO2 24 04/17/2019 1441   CO2 20 (L) 04/28/2017 1326   BUN 10 04/17/2019 1441   BUN 12.7 04/28/2017 1326   CREATININE 0.98 04/17/2019 1441   CREATININE 0.8 04/28/2017 1326      Component Value Date/Time   CALCIUM 9.9 04/17/2019 1441   CALCIUM 9.0 04/28/2017 1326   ALKPHOS 117 04/17/2019 1441   ALKPHOS 94 04/28/2017 1326   AST 72 (H) 04/17/2019 1441   AST 23 04/28/2017 1326   ALT 128 (H) 04/17/2019 1441   ALT 37 04/28/2017 1326   BILITOT 0.9 04/17/2019 1441   BILITOT 0.42 04/28/2017 1326       No results found for: LABCA2  No components found for: LABCA125  No results for input(s): INR in the last 168 hours.  Urinalysis    Component Value Date/Time   BILIRUBINUR Negative 09/22/2011 1451   PROTEINUR Trace 09/22/2011 1451   UROBILINOGEN negative 09/22/2011 1451   NITRITE Negative 09/22/2011  1451   LEUKOCYTESUR Trace 09/22/2011 1451    Lab Results  Component Value Date   CA2729 17.9 01/24/2019   CA2729 27.8 11/09/2018   CA2729 24.3 07/14/2018   CA2729 27.3 06/24/2018   CA2729 24.8 05/19/2018    STUDIES: Nm Pet Image Restag (ps) Skull Base To Thigh  Result Date: 04/12/2019 CLINICAL DATA:  Subsequent treatment strategy for left breast cancer. EXAM: NUCLEAR MEDICINE PET SKULL BASE TO THIGH TECHNIQUE: 10.4 mCi F-18 FDG was injected intravenously. Full-ring PET imaging was performed from the skull base to thigh after the radiotracer. CT data was obtained and used for attenuation correction and anatomic localization. Fasting blood glucose: 171 mg/dl COMPARISON:  Multiple exams, including PET-CT from 04/19/2018 FINDINGS: Mediastinal blood pool activity: SUV max 2.9 Liver activity: SUV max 3.9 NECK: Dental activity along the right maxilla, maximum SUV 7.5, correlating with an intervally extracted tooth # 5. Incidental CT findings: none CHEST: Reduced thickness of the left posterior pleural thickening along the left upper hemithorax compared to prior chest CT, current thickness 0.5 cm and previously 0.8 cm. Maximum SUV in this vicinity is 2.4 (previously 1.8), but currently below background blood pool activity (previously about at background blood pool activity). Other slight pleural nodularity along the first intercostal space anteriorly is roughly stable, maximum SUV of 2.7 which is just below blood pool activity, previous maximum SUV of 2.2 which was previously above blood pool activity. Incidental CT findings: Postoperative findings in the left  breast and axilla. ABDOMEN/PELVIS: No significant abnormal hypermetabolic activity in this region. Incidental CT findings: Diffuse hepatic steatosis. SKELETON: Scattered faintly accentuated bony activity. In the T9 vertebral body there is no visible lesion on the CT data but there is mildly accentuated central activity with maximum SUV at 4.9. This  is slightly greater than at other vertebral levels, for example at T10 the maximum SUV is 4.3. Incidental CT findings: none IMPRESSION: 1. Reduced thickness of pleural thickening on the left, also with proportionally reduced activity compared to the blood pool. No new pleural lesions. 2. Diffuse hepatic steatosis. 3. Very faintly accentuated activity in the T9 vertebral body with no correlative CT findings. This is highly likely to be incidental, but attention to the T9 vertebral body is directed on follow up studies. Electronically Signed   By: Van Clines M.D.   On: 04/12/2019 13:46    ASSESSMENT: 53 y.o. [place] woman status post left breast lower inner quadrant and left axillary lymph node biopsy 06/30/2013 for a clinical T2 N1-2, stage IIB/IIIA invasive ductal carcinoma, grade 2, estrogen and progesterone receptors both strongly positive, HER-2 nonamplified, with an MIB-1 of 36%.  (1) there is a 5 mm left internal mammary lymph node which was negative on PET scan  (2) cyclophosphamide and doxorubicin started 08/04/2014, given in dose dense fashion x4 with Neulasta day 2; completed 09/14/2013, followed by paclitaxel again given in dose dense fashion x4, completed 11/09/2013  (3) left lumpectomy and axillary lymph node dissection 12/13/2013 at [facility] showed a residual pT2 pN1 (mic), stage IIB invasive ductal carcinoma; margins positive for DCIS cleared with subsequent surgery (01/03/2014)  (4) adjuvant radiation therapy completed 04/03/2014  (5) tamoxifen started December 2015, discontinued October 2018  (6) hepatic steatosis: documented on abdomianl US 08/07/2014, with elevated but stable LFTs,  (7) genetic testing 07/24/2013 through the  "Women's Hereditary Panel", performed at [facility], found no deleterious mutations in  ATM, BRCA1, BRCA2, BRIP1, CDH1, CHEK2 (c.1100delC only), EPCAM, MLH1, MSH2, MSH6, NBN, PALB2, PMS2, PTEN, RAD51C, STK11, and TP53.  (8)  METASTATIC DISEASE: October 2018 -- chest CT scan 03/17/2017 shows left pleural thickening and a left pleural effusion; PET scan 03/24/2017 shows no liver or bone involvement and no involvement of the right lung.  (a) biopsy of the left pleural mass 04/06/2017 confirms metastatic breast cancer, estrogen and progesterone receptor positive, HER-2 negative  (b) CA 27-27 was 86.3 on 03/30/2017  (9) anastrozole started October 2018  (a) palbociclib added 04/12/2017 at 125 mg/day 21/7  (b) CT chest on 01/26/2018 shows improvement in nodular and plaque like pleural metastatic disease without complete resolution  (c) PET scan on 04/19/18 consistent with continued improvement of metastatic disease  (d) CT scan of the chest 10/05/2018 shows no active disease.  (e) palbociclib dose decreased to 100 mg May 2020 because we are checking patient's labs less frequently during the pandemic  (f) PET scan 04/12/2019 shows continuing response    PLAN: Deniqua is now a little over 3 years out from definitive findings of metastatic breast cancer.  Her disease is very well controlled, and she is tolerating treatment well.  The plan is to continue the palbociclib and anastrozole as we have been doing.  She will have the next visit and lab work in 3 months and then again in 6 months.  By the 57-monthvisit she should have had her repeat mammography and hopefully also a bone density scan at Dr. RBoyd Kerbs  She will have a repeat CT  scan of the chest before the May visit  Once she has been vaccinated for the novel coronavirus I will consider upping the palbociclib dose again to 125 mg.  I encouraged her to always get up at exactly the same time in the morning and to keep her bed room cold at night.  She can try Tylenol PM to see if that works better than the melatonin that she has been using.  She knows to call for any other issues that may develop before the next visit.   Ailie Gage, [name], MD  04/17/19 3:19  PM Medical Oncology and Hematology [facility] [place], [place] [address] Tel. [phone]    Fax. [phone]   I, Wilburn Mylar, am acting as scribe for Dr. [name]. Kimberlea Schlag.  I, Lurline Del MD, have reviewed the above documentation for accuracy and completeness, and I agree with the above.

## 2019-04-17 ENCOUNTER — Inpatient Hospital Stay: Payer: BC Managed Care – PPO | Attending: Oncology | Admitting: Oncology

## 2019-04-17 ENCOUNTER — Other Ambulatory Visit: Payer: Self-pay

## 2019-04-17 ENCOUNTER — Inpatient Hospital Stay: Payer: BC Managed Care – PPO

## 2019-04-17 VITALS — BP 122/90 | HR 99 | Temp 97.8°F | Resp 18 | Ht 64.0 in | Wt 205.6 lb

## 2019-04-17 DIAGNOSIS — Z170 Estrogen receptor positive status [ER+]: Secondary | ICD-10-CM

## 2019-04-17 DIAGNOSIS — Z8249 Family history of ischemic heart disease and other diseases of the circulatory system: Secondary | ICD-10-CM | POA: Diagnosis not present

## 2019-04-17 DIAGNOSIS — Z79899 Other long term (current) drug therapy: Secondary | ICD-10-CM | POA: Insufficient documentation

## 2019-04-17 DIAGNOSIS — K589 Irritable bowel syndrome without diarrhea: Secondary | ICD-10-CM | POA: Insufficient documentation

## 2019-04-17 DIAGNOSIS — C50312 Malignant neoplasm of lower-inner quadrant of left female breast: Secondary | ICD-10-CM | POA: Insufficient documentation

## 2019-04-17 DIAGNOSIS — Z79811 Long term (current) use of aromatase inhibitors: Secondary | ICD-10-CM | POA: Diagnosis not present

## 2019-04-17 DIAGNOSIS — F419 Anxiety disorder, unspecified: Secondary | ICD-10-CM | POA: Diagnosis not present

## 2019-04-17 DIAGNOSIS — E119 Type 2 diabetes mellitus without complications: Secondary | ICD-10-CM

## 2019-04-17 DIAGNOSIS — K219 Gastro-esophageal reflux disease without esophagitis: Secondary | ICD-10-CM | POA: Diagnosis not present

## 2019-04-17 DIAGNOSIS — I341 Nonrheumatic mitral (valve) prolapse: Secondary | ICD-10-CM | POA: Diagnosis not present

## 2019-04-17 DIAGNOSIS — E785 Hyperlipidemia, unspecified: Secondary | ICD-10-CM | POA: Diagnosis not present

## 2019-04-17 DIAGNOSIS — Z923 Personal history of irradiation: Secondary | ICD-10-CM | POA: Insufficient documentation

## 2019-04-17 DIAGNOSIS — C7802 Secondary malignant neoplasm of left lung: Secondary | ICD-10-CM | POA: Diagnosis not present

## 2019-04-17 DIAGNOSIS — K760 Fatty (change of) liver, not elsewhere classified: Secondary | ICD-10-CM | POA: Insufficient documentation

## 2019-04-17 DIAGNOSIS — C7800 Secondary malignant neoplasm of unspecified lung: Secondary | ICD-10-CM

## 2019-04-17 LAB — COMPREHENSIVE METABOLIC PANEL
ALT: 128 U/L — ABNORMAL HIGH (ref 0–44)
AST: 72 U/L — ABNORMAL HIGH (ref 15–41)
Albumin: 4.3 g/dL (ref 3.5–5.0)
Alkaline Phosphatase: 117 U/L (ref 38–126)
Anion gap: 13 (ref 5–15)
BUN: 10 mg/dL (ref 6–20)
CO2: 24 mmol/L (ref 22–32)
Calcium: 9.9 mg/dL (ref 8.9–10.3)
Chloride: 102 mmol/L (ref 98–111)
Creatinine, Ser: 0.98 mg/dL (ref 0.44–1.00)
GFR calc Af Amer: >60 mL/min (ref >60)
GFR calc non Af Amer: >60 mL/min (ref >60)
Glucose, Bld: 217 mg/dL — ABNORMAL HIGH (ref 70–99)
Potassium: 4.4 mmol/L (ref 3.5–5.1)
Sodium: 139 mmol/L (ref 135–145)
Total Bilirubin: 0.9 mg/dL (ref 0.3–1.2)
Total Protein: 7.5 g/dL (ref 6.5–8.1)

## 2019-04-17 LAB — CBC WITH DIFFERENTIAL/PLATELET
Abs Immature Granulocytes: 0.01 10*3/uL (ref 0.00–0.07)
Basophils Absolute: 0 10*3/uL (ref 0.0–0.1)
Basophils Relative: 1 %
Eosinophils Absolute: 0.1 10*3/uL (ref 0.0–0.5)
Eosinophils Relative: 2 %
HCT: 40.3 % (ref 36.0–46.0)
Hemoglobin: 14 g/dL (ref 12.0–15.0)
Immature Granulocytes: 0 %
Lymphocytes Relative: 28 %
Lymphs Abs: 0.9 10*3/uL (ref 0.7–4.0)
MCH: 30.9 pg (ref 26.0–34.0)
MCHC: 34.7 g/dL (ref 30.0–36.0)
MCV: 89 fL (ref 80.0–100.0)
Monocytes Absolute: 0.2 10*3/uL (ref 0.1–1.0)
Monocytes Relative: 5 %
Neutro Abs: 2 10*3/uL (ref 1.7–7.7)
Neutrophils Relative %: 64 %
Platelets: 339 10*3/uL (ref 150–400)
RBC: 4.53 MIL/uL (ref 3.87–5.11)
RDW: 12.6 % (ref 11.5–15.5)
WBC: 3.2 10*3/uL — ABNORMAL LOW (ref 4.0–10.5)
nRBC: 0 % (ref 0.0–0.2)

## 2019-04-18 ENCOUNTER — Telehealth: Payer: Self-pay | Admitting: Oncology

## 2019-04-18 LAB — CANCER ANTIGEN 27.29: CA 27.29: 29.5 U/mL (ref 0.0–38.6)

## 2019-04-18 NOTE — Telephone Encounter (Signed)
I left a message regarding schedule  

## 2019-05-06 DIAGNOSIS — R05 Cough: Secondary | ICD-10-CM | POA: Diagnosis not present

## 2019-05-06 DIAGNOSIS — C50919 Malignant neoplasm of unspecified site of unspecified female breast: Secondary | ICD-10-CM | POA: Diagnosis not present

## 2019-05-06 DIAGNOSIS — C7800 Secondary malignant neoplasm of unspecified lung: Secondary | ICD-10-CM | POA: Diagnosis not present

## 2019-05-07 DIAGNOSIS — R05 Cough: Secondary | ICD-10-CM | POA: Diagnosis not present

## 2019-05-09 ENCOUNTER — Telehealth: Payer: Self-pay | Admitting: *Deleted

## 2019-05-09 NOTE — Telephone Encounter (Signed)
This RN spoke with pt per her VM stating she has tested positive for Covid.  She is having fevers of up to 102, no smell, no taster, sore throat and cough.  She is on ibrance 100 mg a day and is presently on day 6.  Per review with MD - recommendation is for the patient to hold Ibrance at this time due to no known data relating to having Covid and being on this medication.  Debbie [name] will call this RN in two weeks with status update and further recommendations.  Debbie [name] stated understanding,  No further needs at this time.

## 2019-05-10 ENCOUNTER — Other Ambulatory Visit: Payer: Self-pay | Admitting: Nurse Practitioner

## 2019-05-10 DIAGNOSIS — E119 Type 2 diabetes mellitus without complications: Secondary | ICD-10-CM

## 2019-05-10 DIAGNOSIS — U071 COVID-19: Secondary | ICD-10-CM

## 2019-05-10 NOTE — Progress Notes (Signed)
  I connected by phone with Debbie [name] on 05/10/2019 at 3:59 PM to discuss the potential use of an new treatment for mild to moderate COVID-19 viral infection in non-hospitalized patients.  This patient is a 53 y.o. female that meets the FDA criteria for Emergency Use Authorization of bamlanivimab or casirivimab\imdevimab.  Has a (+) direct SARS-CoV-2 viral test result  Has mild or moderate COVID-19   Is ? 53 years of age and weighs ? 40 kg  Is NOT hospitalized due to COVID-19  Is NOT requiring oxygen therapy or requiring an increase in baseline oxygen flow rate due to COVID-19  Is within 10 days of symptom onset  Has at least one of the high risk factor(s) for progression to severe COVID-19 and/or hospitalization as defined in EUA.  Specific high risk criteria : Diabetes Patient is managed for the following: Will bring copy of positive covid results to be scanned into chart. Patient Active Problem List   Diagnosis Date Noted  . Diabetes mellitus type II, non insulin dependent ([place]) 07/14/2018  . Lung metastases ([place]) 03/24/2017  . Hepatic steatosis 10/24/2014  . Malignant neoplasm of lower-inner quadrant of left breast in female, estrogen receptor positive ([place]) 07/04/2013  . Hypercholesteremia 03/30/2012  . IBS (irritable bowel syndrome) 09/10/2011  . MVP (mitral valve prolapse) 09/10/2011    I have spoken and communicated the following to the patient or parent/caregiver:  1. FDA has authorized the emergency use of bamlanivimab for the treatment of mild to moderate COVID-19 in adults and pediatric patients with positive results of direct SARS-CoV-2 viral testing who are 65 years of age and older weighing at least 40 kg, and who are at high risk for progressing to severe COVID-19 and/or hospitalization.  2. The significant known and potential risks and benefits of bamlanivimab, and the extent to which such potential risks and benefits are unknown.  3. Information on available  alternative treatments and the risks and benefits of those alternatives, including clinical trials.  4. Patients treated with bamlanivimab should continue to self-isolate and use infection control measures (e.g., wear mask, isolate, social distance, avoid sharing personal items, clean and disinfect "high touch" surfaces, and frequent handwashing) according to CDC guidelines.   5. The patient or parent/caregiver has the option to accept or refuse bamlanivimab.  After reviewing this information with the patient, The patient agreed to proceed with receiving the infusion of bamlanivimab and will be provided a copy of the Fact sheet prior to receiving the infusion.[name] 05/10/2019 3:59 PM

## 2019-05-12 ENCOUNTER — Ambulatory Visit (HOSPITAL_COMMUNITY)
Admission: RE | Admit: 2019-05-12 | Discharge: 2019-05-12 | Disposition: A | Discharge status: Home / Self Care | Payer: BC Managed Care – PPO | Source: Ambulatory Visit | Attending: Pulmonary Disease | Admitting: Pulmonary Disease

## 2019-05-12 DIAGNOSIS — U071 COVID-19: Secondary | ICD-10-CM | POA: Diagnosis not present

## 2019-05-12 DIAGNOSIS — E119 Type 2 diabetes mellitus without complications: Secondary | ICD-10-CM

## 2019-05-12 MED ORDER — DIPHENHYDRAMINE HCL 50 MG/ML IJ SOLN: 50.0000 mg | Freq: Once | INTRAMUSCULAR | Status: DC | PRN

## 2019-05-12 MED ORDER — EPINEPHRINE 0.3 MG/0.3ML IJ SOAJ: 0.3000 mg | Freq: Once | INTRAMUSCULAR | Status: DC | PRN

## 2019-05-12 MED ORDER — FAMOTIDINE IN NACL 20-0.9 MG/50ML-% IV SOLN: 20.0000 mg | Freq: Once | INTRAVENOUS | Status: DC | PRN

## 2019-05-12 MED ORDER — SODIUM CHLORIDE 0.9 % IV SOLN
INTRAVENOUS | Status: DC | PRN
  Administered 2019-05-12: 250 mL via INTRAVENOUS

## 2019-05-12 MED ORDER — METHYLPREDNISOLONE SODIUM SUCC 125 MG IJ SOLR: 125.0000 mg | Freq: Once | INTRAMUSCULAR | Status: DC | PRN

## 2019-05-12 MED ORDER — SODIUM CHLORIDE 0.9 % IV SOLN
700.0000 mg | Freq: Once | INTRAVENOUS | Status: AC
  Administered 2019-05-12: 700 mg via INTRAVENOUS
  Filled 2019-05-12: qty 20

## 2019-05-12 MED ORDER — ALBUTEROL SULFATE HFA 108 (90 BASE) MCG/ACT IN AERS: 2.0000 | INHALATION_SPRAY | Freq: Once | RESPIRATORY_TRACT | Status: DC | PRN

## 2019-06-19 ENCOUNTER — Telehealth: Payer: Self-pay

## 2019-06-19 NOTE — Telephone Encounter (Signed)
Received fax from [place] mail delivery pharmacy stating they have made several attempts to contact pt to set up a delivery time for her IBrance. This RN lvm with above information for pt along with a number she can call to contact [facility].

## 2019-07-16 ENCOUNTER — Other Ambulatory Visit: Payer: Self-pay | Admitting: Oncology

## 2019-07-16 DIAGNOSIS — C50312 Malignant neoplasm of lower-inner quadrant of left female breast: Secondary | ICD-10-CM

## 2019-07-16 DIAGNOSIS — R12 Heartburn: Secondary | ICD-10-CM

## 2019-07-18 ENCOUNTER — Inpatient Hospital Stay: Payer: BC Managed Care – PPO | Attending: Adult Health

## 2019-07-18 ENCOUNTER — Telehealth: Payer: Self-pay | Admitting: *Deleted

## 2019-07-18 ENCOUNTER — Inpatient Hospital Stay: Payer: BC Managed Care – PPO | Admitting: Adult Health

## 2019-07-18 DIAGNOSIS — K760 Fatty (change of) liver, not elsewhere classified: Secondary | ICD-10-CM | POA: Insufficient documentation

## 2019-07-18 DIAGNOSIS — C50212 Malignant neoplasm of upper-inner quadrant of left female breast: Secondary | ICD-10-CM | POA: Insufficient documentation

## 2019-07-18 DIAGNOSIS — Z170 Estrogen receptor positive status [ER+]: Secondary | ICD-10-CM | POA: Insufficient documentation

## 2019-07-18 DIAGNOSIS — Z923 Personal history of irradiation: Secondary | ICD-10-CM | POA: Insufficient documentation

## 2019-07-18 DIAGNOSIS — J90 Pleural effusion, not elsewhere classified: Secondary | ICD-10-CM | POA: Insufficient documentation

## 2019-07-18 DIAGNOSIS — Z79811 Long term (current) use of aromatase inhibitors: Secondary | ICD-10-CM | POA: Insufficient documentation

## 2019-07-18 NOTE — Telephone Encounter (Signed)
Called pt to make aware of missed appt. Pt apologized and wants to reschedule for this week. Scheduling message sent to schedule labs and office visit with Wilber Bihari, NP.

## 2019-07-19 ENCOUNTER — Telehealth: Payer: Self-pay | Admitting: Adult Health

## 2019-07-19 NOTE — Telephone Encounter (Signed)
Scheduled appt per 2/16 sch message - pt aware of appt date and time

## 2019-07-25 ENCOUNTER — Encounter: Payer: Self-pay | Admitting: Adult Health

## 2019-07-25 ENCOUNTER — Inpatient Hospital Stay: Payer: BC Managed Care – PPO

## 2019-07-25 ENCOUNTER — Inpatient Hospital Stay (HOSPITAL_BASED_OUTPATIENT_CLINIC_OR_DEPARTMENT_OTHER): Payer: BC Managed Care – PPO | Admitting: Adult Health

## 2019-07-25 ENCOUNTER — Telehealth: Payer: Self-pay | Admitting: Pharmacist

## 2019-07-25 ENCOUNTER — Other Ambulatory Visit: Payer: Self-pay

## 2019-07-25 VITALS — BP 126/88 | HR 90 | Temp 98.0°F | Resp 18 | Ht 64.0 in | Wt 201.9 lb

## 2019-07-25 DIAGNOSIS — Z170 Estrogen receptor positive status [ER+]: Secondary | ICD-10-CM

## 2019-07-25 DIAGNOSIS — C7802 Secondary malignant neoplasm of left lung: Secondary | ICD-10-CM | POA: Diagnosis not present

## 2019-07-25 DIAGNOSIS — C50312 Malignant neoplasm of lower-inner quadrant of left female breast: Secondary | ICD-10-CM

## 2019-07-25 DIAGNOSIS — C7800 Secondary malignant neoplasm of unspecified lung: Secondary | ICD-10-CM

## 2019-07-25 DIAGNOSIS — K760 Fatty (change of) liver, not elsewhere classified: Secondary | ICD-10-CM | POA: Diagnosis not present

## 2019-07-25 DIAGNOSIS — Z79811 Long term (current) use of aromatase inhibitors: Secondary | ICD-10-CM | POA: Diagnosis not present

## 2019-07-25 DIAGNOSIS — J90 Pleural effusion, not elsewhere classified: Secondary | ICD-10-CM | POA: Diagnosis not present

## 2019-07-25 DIAGNOSIS — C50212 Malignant neoplasm of upper-inner quadrant of left female breast: Secondary | ICD-10-CM | POA: Diagnosis not present

## 2019-07-25 DIAGNOSIS — Z923 Personal history of irradiation: Secondary | ICD-10-CM | POA: Diagnosis not present

## 2019-07-25 LAB — CBC WITH DIFFERENTIAL/PLATELET
Abs Immature Granulocytes: 0.15 10*3/uL — ABNORMAL HIGH (ref 0.00–0.07)
Basophils Absolute: 0 10*3/uL (ref 0.0–0.1)
Basophils Relative: 1 %
Eosinophils Absolute: 0.1 10*3/uL (ref 0.0–0.5)
Eosinophils Relative: 2 %
HCT: 39.9 % (ref 36.0–46.0)
Hemoglobin: 13.6 g/dL (ref 12.0–15.0)
Immature Granulocytes: 4 %
Lymphocytes Relative: 38 %
Lymphs Abs: 1.5 10*3/uL (ref 0.7–4.0)
MCH: 30.5 pg (ref 26.0–34.0)
MCHC: 34.1 g/dL (ref 30.0–36.0)
MCV: 89.5 fL (ref 80.0–100.0)
Monocytes Absolute: 0.7 10*3/uL (ref 0.1–1.0)
Monocytes Relative: 16 %
Neutro Abs: 1.6 10*3/uL — ABNORMAL LOW (ref 1.7–7.7)
Neutrophils Relative %: 39 %
Platelets: 256 10*3/uL (ref 150–400)
RBC: 4.46 MIL/uL (ref 3.87–5.11)
RDW: 14.3 % (ref 11.5–15.5)
WBC: 4.1 10*3/uL (ref 4.0–10.5)
nRBC: 0 % (ref 0.0–0.2)

## 2019-07-25 LAB — COMPREHENSIVE METABOLIC PANEL
ALT: 42 U/L (ref 0–44)
AST: 24 U/L (ref 15–41)
Albumin: 4.2 g/dL (ref 3.5–5.0)
Alkaline Phosphatase: 99 U/L (ref 38–126)
Anion gap: 9 (ref 5–15)
BUN: 8 mg/dL (ref 6–20)
CO2: 25 mmol/L (ref 22–32)
Calcium: 9.5 mg/dL (ref 8.9–10.3)
Chloride: 106 mmol/L (ref 98–111)
Creatinine, Ser: 0.76 mg/dL (ref 0.44–1.00)
GFR calc Af Amer: >60 mL/min (ref >60)
GFR calc non Af Amer: >60 mL/min (ref >60)
Glucose, Bld: 189 mg/dL — ABNORMAL HIGH (ref 70–99)
Potassium: 4.2 mmol/L (ref 3.5–5.1)
Sodium: 140 mmol/L (ref 135–145)
Total Bilirubin: 0.5 mg/dL (ref 0.3–1.2)
Total Protein: 7.2 g/dL (ref 6.5–8.1)

## 2019-07-25 NOTE — Telephone Encounter (Signed)
Oral Chemotherapy Pharmacist Encounter  Dispensed samples to patient:  Medication: Ibrance 100mg  Instructions: TAKE 1 TABLET DAILY. TAKE FOR 21 DAYS ON, 7 DAYS OFF, REPEAT EVERY 28 DAYS Quantity dispensed: 21 tablets Days supply: 28 days Manufacturer: Pfizer Lot: [id number] Exp: 07/2020  Darl Pikes, PharmD, BCPS, [facility] Hematology/Oncology Clinical Pharmacist [facility]/HP/AP [facility] [phone]  07/25/2019 2:17 PM
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
## 2019-07-26 LAB — CANCER ANTIGEN 27.29: CA 27.29: 17.2 U/mL (ref 0.0–38.6)

## 2019-09-28 ENCOUNTER — Telehealth: Payer: Self-pay | Admitting: Oncology

## 2019-09-28 NOTE — Telephone Encounter (Signed)
R/s appt per 4/28 sch message - unable to reach pt on mobile, left message on home phone with new appt.

## 2019-10-11 ENCOUNTER — Other Ambulatory Visit: Payer: Self-pay

## 2019-10-11 ENCOUNTER — Encounter: Payer: Self-pay | Admitting: Oncology

## 2019-10-11 ENCOUNTER — Ambulatory Visit (HOSPITAL_COMMUNITY)
Admission: RE | Admit: 2019-10-11 | Discharge: 2019-10-11 | Disposition: A | Discharge status: Home / Self Care | Payer: BC Managed Care – PPO | Source: Ambulatory Visit | Attending: Oncology | Admitting: Oncology

## 2019-10-11 ENCOUNTER — Inpatient Hospital Stay: Payer: BC Managed Care – PPO | Attending: Adult Health

## 2019-10-11 DIAGNOSIS — C50919 Malignant neoplasm of unspecified site of unspecified female breast: Secondary | ICD-10-CM | POA: Diagnosis not present

## 2019-10-11 DIAGNOSIS — I10 Essential (primary) hypertension: Secondary | ICD-10-CM | POA: Diagnosis not present

## 2019-10-11 DIAGNOSIS — C50312 Malignant neoplasm of lower-inner quadrant of left female breast: Secondary | ICD-10-CM | POA: Diagnosis not present

## 2019-10-11 DIAGNOSIS — C7802 Secondary malignant neoplasm of left lung: Secondary | ICD-10-CM | POA: Diagnosis not present

## 2019-10-11 DIAGNOSIS — Z170 Estrogen receptor positive status [ER+]: Secondary | ICD-10-CM | POA: Insufficient documentation

## 2019-10-11 DIAGNOSIS — K589 Irritable bowel syndrome without diarrhea: Secondary | ICD-10-CM | POA: Insufficient documentation

## 2019-10-11 DIAGNOSIS — Z79811 Long term (current) use of aromatase inhibitors: Secondary | ICD-10-CM | POA: Insufficient documentation

## 2019-10-11 DIAGNOSIS — Z923 Personal history of irradiation: Secondary | ICD-10-CM | POA: Diagnosis not present

## 2019-10-11 DIAGNOSIS — F419 Anxiety disorder, unspecified: Secondary | ICD-10-CM | POA: Diagnosis not present

## 2019-10-11 DIAGNOSIS — E119 Type 2 diabetes mellitus without complications: Secondary | ICD-10-CM | POA: Insufficient documentation

## 2019-10-11 DIAGNOSIS — K760 Fatty (change of) liver, not elsewhere classified: Secondary | ICD-10-CM | POA: Insufficient documentation

## 2019-10-11 DIAGNOSIS — K219 Gastro-esophageal reflux disease without esophagitis: Secondary | ICD-10-CM | POA: Insufficient documentation

## 2019-10-11 DIAGNOSIS — I341 Nonrheumatic mitral (valve) prolapse: Secondary | ICD-10-CM | POA: Insufficient documentation

## 2019-10-11 DIAGNOSIS — C50212 Malignant neoplasm of upper-inner quadrant of left female breast: Secondary | ICD-10-CM | POA: Diagnosis not present

## 2019-10-11 DIAGNOSIS — Z79899 Other long term (current) drug therapy: Secondary | ICD-10-CM | POA: Insufficient documentation

## 2019-10-11 DIAGNOSIS — C7800 Secondary malignant neoplasm of unspecified lung: Secondary | ICD-10-CM

## 2019-10-11 DIAGNOSIS — E785 Hyperlipidemia, unspecified: Secondary | ICD-10-CM | POA: Diagnosis not present

## 2019-10-11 DIAGNOSIS — Z801 Family history of malignant neoplasm of trachea, bronchus and lung: Secondary | ICD-10-CM | POA: Diagnosis not present

## 2019-10-11 LAB — CBC WITH DIFFERENTIAL/PLATELET
Abs Immature Granulocytes: 0.01 10*3/uL (ref 0.00–0.07)
Basophils Absolute: 0.1 10*3/uL (ref 0.0–0.1)
Basophils Relative: 2 %
Eosinophils Absolute: 0.1 10*3/uL (ref 0.0–0.5)
Eosinophils Relative: 2 %
HCT: 40.3 % (ref 36.0–46.0)
Hemoglobin: 13.8 g/dL (ref 12.0–15.0)
Immature Granulocytes: 0 %
Lymphocytes Relative: 47 %
Lymphs Abs: 1.2 10*3/uL (ref 0.7–4.0)
MCH: 30.7 pg (ref 26.0–34.0)
MCHC: 34.2 g/dL (ref 30.0–36.0)
MCV: 89.8 fL (ref 80.0–100.0)
Monocytes Absolute: 0.3 10*3/uL (ref 0.1–1.0)
Monocytes Relative: 12 %
Neutro Abs: 1 10*3/uL — ABNORMAL LOW (ref 1.7–7.7)
Neutrophils Relative %: 37 %
Platelets: 275 10*3/uL (ref 150–400)
RBC: 4.49 MIL/uL (ref 3.87–5.11)
RDW: 13.2 % (ref 11.5–15.5)
WBC: 2.5 10*3/uL — ABNORMAL LOW (ref 4.0–10.5)
nRBC: 0 % (ref 0.0–0.2)

## 2019-10-11 LAB — COMPREHENSIVE METABOLIC PANEL
ALT: 43 U/L (ref 0–44)
AST: 24 U/L (ref 15–41)
Albumin: 4.2 g/dL (ref 3.5–5.0)
Alkaline Phosphatase: 124 U/L (ref 38–126)
Anion gap: 12 (ref 5–15)
BUN: 10 mg/dL (ref 6–20)
CO2: 23 mmol/L (ref 22–32)
Calcium: 9.6 mg/dL (ref 8.9–10.3)
Chloride: 105 mmol/L (ref 98–111)
Creatinine, Ser: 0.79 mg/dL (ref 0.44–1.00)
GFR calc Af Amer: >60 mL/min (ref >60)
GFR calc non Af Amer: >60 mL/min (ref >60)
Glucose, Bld: 239 mg/dL — ABNORMAL HIGH (ref 70–99)
Potassium: 4.6 mmol/L (ref 3.5–5.1)
Sodium: 140 mmol/L (ref 135–145)
Total Bilirubin: 0.5 mg/dL (ref 0.3–1.2)
Total Protein: 7.3 g/dL (ref 6.5–8.1)

## 2019-10-11 MED ORDER — IOHEXOL 300 MG/ML  SOLN
75.0000 mL | Freq: Once | INTRAMUSCULAR | Status: AC | PRN
  Administered 2019-10-11: 75 mL via INTRAVENOUS

## 2019-10-11 MED ORDER — SODIUM CHLORIDE (PF) 0.9 % IJ SOLN
INTRAMUSCULAR | Status: AC
  Filled 2019-10-11: qty 50

## 2019-10-12 LAB — CANCER ANTIGEN 27.29: CA 27.29: 27 U/mL (ref 0.0–38.6)

## 2019-10-15 NOTE — Progress Notes (Signed)
[place]  [phone] [phone]    ID: [name] OB: January 17, 1966  MR#: [id number]  HKV#:[id number]  Patient Care Team: Harlan Stains, MD as PCP - General (Family Medicine) Meir Elwood, [name], MD as Consulting Physician (Oncology) [name], MD as Referring Physician (Surgery) [name], MD as Consulting Physician (Obstetrics and Gynecology) [name], MD as Consulting Physician (General Surgery) Kyung Holzschuh, MD as Consulting Physician (Radiation Oncology) [name], [name], MD as Consulting Physician (Allergy and Immunology) [name], MD as Consulting Physician (Gastroenterology) [name], MD as Referring Physician (Internal Medicine) [name], [name], MD as Referring Physician (Oncology) OTHER MD:    CHIEF COMPLAINT: locally advanced estrogen receptor positive breast cancer  CURRENT TREATMENT:  Anastrozole, palbociclib   INTERVAL HISTORY: Telisa returns today for follow-up of her estrogen receptor positive metastatic breast cancer.   She continues on anastrozole.  Hot flashes are not a major concern.  She is having some hair thinning.  She obtains the drug at a good price.  She also continues on palbociclib. She does feel more tired than she probably would feel if she were not on this medication.  She has no other side effects from it that she is aware of  Since her last visit, she underwent chest CT on 10/11/2019 showing: non-measurable subtle increase in interior, convex margin of soft tissue along the left first intercostal space; similar left chest pleural thickening.   REVIEW OF SYSTEMS: Willie walks for exercise, somewhat intermittently.  Some weeks she does for 5 days in a row and other days she does not walk at all.  She has had both her Covid vaccine shots as did her husband.  Her children have not been vaccinated.  She is not controlling her diet and as a result her blood sugar today  is over 250.  Aside from these issues a detailed review of systems today was stable and in particular she denies any unusual or persistent focal pain, fever, bleeding, or rash.   BREAST CANCER HISTORY: As per previously documented note:  Shauntae had routine yearly screening mammography at the breast center 06/19/2013 showing a possible mass in the left breast. The right breast was unremarkable. On 06/30/2013 additional views of the left breast showed an irregular spiculated dense mass measuring 2.2 cm in the lower inner quadrant. This was palpable at the 8:00 location. Ultrasound showed an irregular hypoechoic mass measuring 2.3 cm and in the left axilla a dominant abnormal appearing lymph node with cortical thickening measuring 1.2 cm.  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  Subsequently the patient underwent bilateral breast MRI. This showed her breasts to be dense (composition C.). In the left breast at the 8:00 position there was a 2.4 cm enhancing mass containing a biopsy clip. There were no additional areas of concern. In the axilla on the left there was a 2.6 cm enlarged left axillary lymph node. There was also a 0.5 cm left internal mammary lymph nodes noted. The right side was unremarkable.  The patient's subsequent history is as detailed below.   PAST MEDICAL HISTORY: Past Medical History:  Diagnosis Date  .  Abnormal Pap smear   . Anxiety   . Breast cancer ([place]) 2015   ER+/PR+/Her2- Left - Dr. Earlie Lou  . GERD (gastroesophageal reflux disease)    occasional  . Hyperlipidemia    no meds   . IBS (irritable  bowel syndrome)   . IBS (irritable bowel syndrome)   . Menorrhagia   . Migraines    menstral  . Migraines   . MVP (mitral valve prolapse)    echo 2/15-mitral valve normal-no regurg,no stenosis  . Pre-diabetes    no meds  . Radiation 02/14/14-04/03/14   Left Breast  . Seasonal allergies   . SVD (spontaneous vaginal delivery)    x 3    PAST SURGICAL HISTORY: Past Surgical History:  Procedure Laterality Date  . BREAST SURGERY Left 2015   lumpectomy x 2 - Dr. Jana Hakim  . COLONOSCOPY    . DILATATION & CURRETTAGE/HYSTEROSCOPY WITH RESECTOCOPE N/A 09/14/2017   Procedure: DILATATION & CURETTAGE/HYSTEROSCOPY WITH RESECTOCOPE;  Surgeon: [name], MD;  Location: [facility];  Service: Gynecology;  Laterality: N/A;  . ENDOMETRIAL ABLATION  2013  . PORT-A-CATH REMOVAL N/A 05/15/2014   Procedure: REMOVAL PORT-A-CATH;  Surgeon: [name], MD;  Location: [facility];  Service: General;  Laterality: N/A;  . PORTACATH PLACEMENT Left 07/18/2013   Procedure: INSERTION PORT-A-CATH;  Surgeon: [name], MD;  Location: [facility];  Service: General;  Laterality: Left;  . SVD      x 3  . VULVA /PERINEUM BIOPSY  2011  . WISDOM TEETH REMOVAL      FAMILY HISTORY Family History  Problem Relation Age of Onset  . Cancer Maternal Grandmother        BREAST AND UTERINE CANCER  . Uterine cancer Maternal Grandmother 84  . Hyperlipidemia Mother   . Hypertension Mother   . Multiple myeloma Father 55  . Lung cancer Maternal Manufacturing engineer  . Testicular cancer Other 54   The patient's father is living, currently age 53 (as of November 2020).. He has a history of multiple myeloma. The patient's mother is 49 and has Parkinson's.  She was diagnosed with ductal carcinoma in situ in 2016. The patient's parents live in [place], [place]. The patient had no brothers, 2 sisters. There is no history of breast or ovarian cancer in the family. She believes one of her grandfathers died from lung  cancer and another from pancreatic cancer, but is not sure   GYNECOLOGIC HISTORY:  Menarche age 54, first live birth age 54, which the patient understands is an independent risk factor for breast cancer. She is GX P3. She was still having regular periods at the start of chemotherapy. LMP was March 2015. She tells me Dr. Cletis Media checked her labs December 2015 and found that she was in the menopausal range. Note that her husband is s/p vasectomy.    SOCIAL HISTORY: (updated May 2021). 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 Her 3 children are [name] (20), [name] (17) and [name] (14).  Martie Round is planning to go back to [employer].  Harmon Pier is a Paramedic in [employer].  The patient attends a local [employer].    ADVANCED DIRECTIVES: In the absence of any document to the contrary the patient's husband is her healthcare power of attorney   HEALTH  MAINTENANCE: Social History   Tobacco Use  . Smoking status: Never Smoker  . Smokeless tobacco: Never Used  Substance Use Topics  . Alcohol use: No  . Drug use: No     Colonoscopy: January 2015  DKS:MMOCAREQ 2014  Bone density:  Lipid panel:  Allergies  Allergen Reactions  . Dust Mite Extract Itching    Current Outpatient Medications  Medication Sig Dispense Refill  . ALPRAZolam (XANAX) 0.5 MG tablet Take 20 minutes before MRI; may repeat once (Patient not taking: Reported on 04/21/2018) 5 tablet 0  . anastrozole (ARIMIDEX) 1 MG tablet Take 1 tablet (1 mg total) by mouth daily. 90 tablet 4  . cetirizine (ZYRTEC) 10 MG tablet Take 10 mg by mouth daily.    . Cholecalciferol (VITAMIN D3) 2000 units TABS Take by mouth daily.     . frovatriptan (FROVA) 2.5 MG tablet Take 1 tablet (2.5 mg total) by mouth as needed for migraine. If recurs, may repeat after 2 hours. Max of 3 tabs in  24 hours. 10 tablet 0  . IBRANCE 100 MG tablet TAKE 1 TABLET DAILY. TAKE FOR 21 DAYS ON, 7 DAYS OFF, REPEAT EVERY 28 DAYS 21 tablet 12  . Lancets (ONETOUCH DELICA PLUS [id number]) MISC     . omeprazole (PRILOSEC) 40 MG capsule TAKE ONE CAPSULE BY MOUTH DAILY AT BEDTIME  60 capsule 0  . oxymetazoline (AFRIN) 0.05 % nasal spray Place 2 sprays into the nose daily as needed. congestion    . PARoxetine (PAXIL) 20 MG tablet Take 20 mg by mouth every morning.     No current facility-administered medications for this visit.    OBJECTIVE:  white woman in no acute distress  There were no vitals filed for this visit.  Wt Readings from Last 3 Encounters:  07/25/19 201 lb 14.4 oz (91.6 kg)  05/12/19 205 lb (93 kg)  04/17/19 205 lb 9.6 oz (93.3 kg)   There is no height or weight on file to calculate BMI.      ECOG FS:1 - Symptomatic but completely ambulatory   Sclerae unicteric, EOMs intact Wearing a mask No cervical or supraclavicular adenopathy Lungs no rales or rhonchi Heart regular rate and rhythm Abd soft, nontender, positive bowel sounds MSK no focal spinal tenderness, no upper extremity lymphedema Neuro: nonfocal, well oriented, appropriate affect Breasts: The right breast is unremarkable.  The left breast is status post lumpectomy and radiation.  There is no evidence of local recurrence.  Both axillae are benign.   LABS:     Component Value Date/Time   NA 140 10/11/2019 1016   NA 139 04/28/2017 1326   K 4.6 10/11/2019 1016   K 4.1 04/28/2017 1326   CL 105 10/11/2019 1016   CO2 23 10/11/2019 1016   CO2 20 (L) 04/28/2017 1326   GLUCOSE 239 (H) 10/11/2019 1016   GLUCOSE 152 (H) 04/28/2017 1326   BUN 10 10/11/2019 1016   BUN 12.7 04/28/2017 1326   CREATININE 0.79 10/11/2019 1016   CREATININE 0.8 04/28/2017 1326   CALCIUM 9.6 10/11/2019 1016   CALCIUM 9.0 04/28/2017 1326   PROT 7.3 10/11/2019 1016   PROT 7.3 04/28/2017 1326   ALBUMIN 4.2 10/11/2019 1016   ALBUMIN 4.1  04/28/2017 1326   AST 24 10/11/2019 1016   AST 23 04/28/2017 1326   ALT 43 10/11/2019 1016   ALT 37 04/28/2017 1326   ALKPHOS 124 10/11/2019 1016   ALKPHOS 94 04/28/2017 1326   BILITOT 0.5 10/11/2019 1016  BILITOT 0.42 04/28/2017 1326   GFRNONAA >60 10/11/2019 1016   GFRAA >60 10/11/2019 1016    I No results found for: SPEP  Lab Results  Component Value Date   WBC 2.5 (L) 10/11/2019   NEUTROABS 1.0 (L) 10/11/2019   HGB 13.8 10/11/2019   HCT 40.3 10/11/2019   MCV 89.8 10/11/2019   PLT 275 10/11/2019      Chemistry      Component Value Date/Time   NA 140 10/11/2019 1016   NA 139 04/28/2017 1326   K 4.6 10/11/2019 1016   K 4.1 04/28/2017 1326   CL 105 10/11/2019 1016   CO2 23 10/11/2019 1016   CO2 20 (L) 04/28/2017 1326   BUN 10 10/11/2019 1016   BUN 12.7 04/28/2017 1326   CREATININE 0.79 10/11/2019 1016   CREATININE 0.8 04/28/2017 1326      Component Value Date/Time   CALCIUM 9.6 10/11/2019 1016   CALCIUM 9.0 04/28/2017 1326   ALKPHOS 124 10/11/2019 1016   ALKPHOS 94 04/28/2017 1326   AST 24 10/11/2019 1016   AST 23 04/28/2017 1326   ALT 43 10/11/2019 1016   ALT 37 04/28/2017 1326   BILITOT 0.5 10/11/2019 1016   BILITOT 0.42 04/28/2017 1326       No results found for: LABCA2  No components found for: LABCA125  No results for input(s): INR in the last 168 hours.  Urinalysis    Component Value Date/Time   BILIRUBINUR Negative 09/22/2011 1451   PROTEINUR Trace 09/22/2011 1451   UROBILINOGEN negative 09/22/2011 1451   NITRITE Negative 09/22/2011 1451   LEUKOCYTESUR Trace 09/22/2011 1451    Lab Results  Component Value Date   CA2729 27.0 10/11/2019   CA2729 17.2 07/25/2019   CA2729 29.5 04/17/2019   CA2729 17.9 01/24/2019   CA2729 27.8 11/09/2018    STUDIES: CT Chest W Contrast  Result Date: 10/11/2019 CLINICAL DATA:  Breast cancer, metastatic disease suspected EXAM: CT CHEST WITH CONTRAST TECHNIQUE: Multidetector CT imaging of the chest  was performed during intravenous contrast administration. CONTRAST:  9m OMNIPAQUE IOHEXOL 300 MG/ML  SOLN COMPARISON:  10/05/2018 FINDINGS: Cardiovascular: Normal caliber thoracic aorta. Normal heart size. No pericardial effusion. Central pulmonary vasculature is unremarkable to the extent evaluated on venous phase assessment. Mediastinum/Nodes: Thoracic inlet structures are normal. Postoperative changes of LEFT axillary dissection without signs of soft tissue or nodal disease in this area. No RIGHT axillary adenopathy. No subpectoral adenopathy. No internal mammary nodal enlargement. Lungs/Pleura: Pleural thickening along the anterior LEFT chest related to prior radiation in the setting of previous LEFT lumpectomy. Unchanged undulation of pleural surface in the dependent LEFT chest, thickness approximately 6 mm on today's exam and 6 mm on the prior study extending along approximately 3 cm of the posterior surface of the pleura in the LEFT upper chest adjacent to LEFT upper lobe (image 27, series 7) subtle pleural nodularity seen in addition to post radiation changes along the anterior LEFT chest at the site of previous pleural-based disease no pleural effusion. Fissural nodularity is very mild and unchanged seen along the major fissure. No new pulmonary mass. No consolidation. Airways are patent. Upper Abdomen: Low-density hepatic parenchyma with lobular contours. Portal vein is patent. Liver is incompletely imaged as are remaining upper abdominal viscera aside from the adrenal glands which are normal. Musculoskeletal: Nonmeasurable subtle increase in the interior, convex margin of soft tissue in the intercostal space along the LEFT first intercostal space (image 24 of series 2) this is slightly more  convex than on the previous study. Postoperative changes of lumpectomy are noted in the LEFT breast unchanged. No acute bone finding or destructive bone process. IMPRESSION: 1. Nonmeasurable subtle increase in the  interior, convex margin of soft tissue in the intercostal space along the LEFT first intercostal space, this is slightly more convex than on the previous study. Consider short interval follow-up, within 3 months, with CT to assess for stability. 2. Pleural thickening in the posterior LEFT chest at the site of previous lesion is similar. 3. Postoperative changes of LEFT axillary dissection without signs of soft tissue or nodal disease in this area. 4. Pleural thickening along the anterior LEFT chest related to prior radiation in the setting of previous LEFT lumpectomy. 5. Hepatic steatosis with lobular hepatic contours, correlate with any clinical or laboratory evidence of liver disease. Electronically Signed   By: Zetta Bills M.D.   On: 10/11/2019 16:50    ASSESSMENT: 54 y.o. [place] woman status post left breast lower inner quadrant and left axillary lymph node biopsy 06/30/2013 for a clinical T2 N1-2, stage IIB/IIIA invasive ductal carcinoma, grade 2, estrogen and progesterone receptors both strongly positive, HER-2 nonamplified, with an MIB-1 of 36%.  (1) there is a 5 mm left internal mammary lymph node which was negative on PET scan  (2) cyclophosphamide and doxorubicin started 08/04/2014, given in dose dense fashion x4 with Neulasta day 2; completed 09/14/2013, followed by paclitaxel again given in dose dense fashion x4, completed 11/09/2013  (3) left lumpectomy and axillary lymph node dissection 12/13/2013 at [facility] showed a residual pT2 pN1 (mic), stage IIB invasive ductal carcinoma; margins positive for DCIS cleared with subsequent surgery (01/03/2014)  (4) adjuvant radiation therapy completed 04/03/2014  (5) tamoxifen started December 2015, discontinued October 2018  (6) hepatic steatosis: documented on abdomianl US 08/07/2014, with elevated but stable LFTs,  (7) genetic testing 07/24/2013 through the  "Women's Hereditary Panel", performed at [facility], found no  deleterious mutations in  ATM, BRCA1, BRCA2, BRIP1, CDH1, CHEK2 (c.1100delC only), EPCAM, MLH1, MSH2, MSH6, NBN, PALB2, PMS2, PTEN, RAD51C, STK11, and TP53.  (8) METASTATIC DISEASE: October 2018 -- chest CT scan 03/17/2017 shows left pleural thickening and a left pleural effusion; PET scan 03/24/2017 shows no liver or bone involvement and no involvement of the right lung.  (a) biopsy of the left pleural mass 04/06/2017 confirms metastatic breast cancer, estrogen and progesterone receptor positive, HER-2 negative  (b) CA 27-27 was 86.3 on 03/30/2017  (9) anastrozole started October 2018  (a) palbociclib added 04/12/2017 at 125 mg/day 21/7  (b) CT chest on 01/26/2018 shows improvement in nodular and plaque like pleural metastatic disease without complete resolution  (c) PET scan on 04/19/18 consistent with continued improvement of metastatic disease  (d) CT scan of the chest 10/05/2018 shows no active disease.  (e) palbociclib dose decreased to 100 mg May 2020 because we are checking patient's labs less frequently during the pandemic  (f) PET scan 04/12/2019 shows continuing response   PLAN: Graclyn is now 2-1/2 years out from definitive diagnosis of metastatic breast cancer, with very well-controlled disease.  This is favorable.  She is tolerating the palbociclib at the current dose of 100 mg daily 21 days on 7 days off.  If the ANC drops much further we will drop the dose to 75 mg.  We discussed the findings of her recent PET scan in great detail today.  I think basically we have stable disease.  We are going to obtain a repeat PET  scan in November and she will see me shortly thereafter.  Instead of a visit in 3 months she will simply have lab work 3 months from now  She knows to call for any other issue that may develop before the next visit.  Total encounter time 35 minutes.Sarajane Jews C. Makarios Madlock, MD  10/16/19 3:17 PM Medical Oncology and Hematology [facility] [place], [place] [address] Tel. [phone]    Fax. [phone]   I, Wilburn Mylar, am acting as scribe for Dr. [name]. Jenilee Franey.  I, Lurline Del MD, have reviewed the above documentation for accuracy and completeness, and I agree with the above.    *Total Encounter Time as defined by the Centers for Medicare and Medicaid Services includes, in addition to the face-to-face time of a patient visit (documented in the note above) non-face-to-face time: obtaining and reviewing outside history, ordering and reviewing medications, tests or procedures, care coordination (communications with other health care professionals or caregivers) and documentation in the medical record.

## 2019-10-16 ENCOUNTER — Inpatient Hospital Stay (HOSPITAL_BASED_OUTPATIENT_CLINIC_OR_DEPARTMENT_OTHER): Payer: BC Managed Care – PPO | Admitting: Oncology

## 2019-10-16 ENCOUNTER — Other Ambulatory Visit: Payer: BC Managed Care – PPO

## 2019-10-16 ENCOUNTER — Other Ambulatory Visit: Payer: Self-pay

## 2019-10-16 VITALS — BP 124/74 | HR 85 | Temp 98.3°F | Resp 18 | Ht 64.0 in | Wt 205.1 lb

## 2019-10-16 DIAGNOSIS — C7800 Secondary malignant neoplasm of unspecified lung: Secondary | ICD-10-CM

## 2019-10-16 DIAGNOSIS — C50212 Malignant neoplasm of upper-inner quadrant of left female breast: Secondary | ICD-10-CM | POA: Diagnosis not present

## 2019-10-16 DIAGNOSIS — E785 Hyperlipidemia, unspecified: Secondary | ICD-10-CM | POA: Diagnosis not present

## 2019-10-16 DIAGNOSIS — C50312 Malignant neoplasm of lower-inner quadrant of left female breast: Secondary | ICD-10-CM

## 2019-10-16 DIAGNOSIS — Z923 Personal history of irradiation: Secondary | ICD-10-CM | POA: Diagnosis not present

## 2019-10-16 DIAGNOSIS — E119 Type 2 diabetes mellitus without complications: Secondary | ICD-10-CM | POA: Diagnosis not present

## 2019-10-16 DIAGNOSIS — Z170 Estrogen receptor positive status [ER+]: Secondary | ICD-10-CM

## 2019-10-16 DIAGNOSIS — I341 Nonrheumatic mitral (valve) prolapse: Secondary | ICD-10-CM | POA: Diagnosis not present

## 2019-10-16 DIAGNOSIS — I10 Essential (primary) hypertension: Secondary | ICD-10-CM | POA: Diagnosis not present

## 2019-10-16 DIAGNOSIS — Z801 Family history of malignant neoplasm of trachea, bronchus and lung: Secondary | ICD-10-CM | POA: Diagnosis not present

## 2019-10-16 DIAGNOSIS — K589 Irritable bowel syndrome without diarrhea: Secondary | ICD-10-CM | POA: Diagnosis not present

## 2019-10-16 DIAGNOSIS — Z79899 Other long term (current) drug therapy: Secondary | ICD-10-CM | POA: Diagnosis not present

## 2019-10-16 DIAGNOSIS — F419 Anxiety disorder, unspecified: Secondary | ICD-10-CM | POA: Diagnosis not present

## 2019-10-16 DIAGNOSIS — Z79811 Long term (current) use of aromatase inhibitors: Secondary | ICD-10-CM | POA: Diagnosis not present

## 2019-10-16 DIAGNOSIS — K760 Fatty (change of) liver, not elsewhere classified: Secondary | ICD-10-CM

## 2019-10-16 DIAGNOSIS — K219 Gastro-esophageal reflux disease without esophagitis: Secondary | ICD-10-CM | POA: Diagnosis not present

## 2019-10-17 ENCOUNTER — Telehealth: Payer: Self-pay | Admitting: Oncology

## 2019-10-17 NOTE — Telephone Encounter (Signed)
Scheduled appts per 5/17 los. Left voicemail with appt dates and times.

## 2020-01-03 DIAGNOSIS — G43909 Migraine, unspecified, not intractable, without status migrainosus: Secondary | ICD-10-CM | POA: Diagnosis not present

## 2020-01-03 DIAGNOSIS — J0110 Acute frontal sinusitis, unspecified: Secondary | ICD-10-CM | POA: Diagnosis not present

## 2020-01-06 ENCOUNTER — Other Ambulatory Visit: Payer: Self-pay | Admitting: Oncology

## 2020-01-11 ENCOUNTER — Other Ambulatory Visit: Payer: Self-pay | Admitting: Oncology

## 2020-01-11 DIAGNOSIS — C50312 Malignant neoplasm of lower-inner quadrant of left female breast: Secondary | ICD-10-CM

## 2020-01-11 DIAGNOSIS — R12 Heartburn: Secondary | ICD-10-CM

## 2020-01-15 ENCOUNTER — Inpatient Hospital Stay: Payer: BC Managed Care – PPO | Attending: Adult Health

## 2020-01-15 ENCOUNTER — Other Ambulatory Visit: Payer: Self-pay

## 2020-01-15 DIAGNOSIS — Z170 Estrogen receptor positive status [ER+]: Secondary | ICD-10-CM | POA: Diagnosis not present

## 2020-01-15 DIAGNOSIS — C7800 Secondary malignant neoplasm of unspecified lung: Secondary | ICD-10-CM | POA: Diagnosis not present

## 2020-01-15 DIAGNOSIS — C50312 Malignant neoplasm of lower-inner quadrant of left female breast: Secondary | ICD-10-CM | POA: Diagnosis not present

## 2020-01-15 LAB — CBC WITH DIFFERENTIAL/PLATELET
Abs Immature Granulocytes: 0.04 10*3/uL (ref 0.00–0.07)
Basophils Absolute: 0 10*3/uL (ref 0.0–0.1)
Basophils Relative: 1 %
Eosinophils Absolute: 0.1 10*3/uL (ref 0.0–0.5)
Eosinophils Relative: 3 %
HCT: 39.5 % (ref 36.0–46.0)
Hemoglobin: 13.8 g/dL (ref 12.0–15.0)
Immature Granulocytes: 1 %
Lymphocytes Relative: 27 %
Lymphs Abs: 1.3 10*3/uL (ref 0.7–4.0)
MCH: 30.6 pg (ref 26.0–34.0)
MCHC: 34.9 g/dL (ref 30.0–36.0)
MCV: 87.6 fL (ref 80.0–100.0)
Monocytes Absolute: 0.3 10*3/uL (ref 0.1–1.0)
Monocytes Relative: 6 %
Neutro Abs: 3 10*3/uL (ref 1.7–7.7)
Neutrophils Relative %: 62 %
Platelets: 274 10*3/uL (ref 150–400)
RBC: 4.51 MIL/uL (ref 3.87–5.11)
RDW: 12.8 % (ref 11.5–15.5)
WBC: 4.9 10*3/uL (ref 4.0–10.5)
nRBC: 0 % (ref 0.0–0.2)

## 2020-01-15 LAB — COMPREHENSIVE METABOLIC PANEL
ALT: 54 U/L — ABNORMAL HIGH (ref 0–44)
AST: 31 U/L (ref 15–41)
Albumin: 4.1 g/dL (ref 3.5–5.0)
Alkaline Phosphatase: 116 U/L (ref 38–126)
Anion gap: 14 (ref 5–15)
BUN: 14 mg/dL (ref 6–20)
CO2: 20 mmol/L — ABNORMAL LOW (ref 22–32)
Calcium: 10 mg/dL (ref 8.9–10.3)
Chloride: 102 mmol/L (ref 98–111)
Creatinine, Ser: 1.06 mg/dL — ABNORMAL HIGH (ref 0.44–1.00)
GFR calc Af Amer: >60 mL/min (ref >60)
GFR calc non Af Amer: 60 mL/min — ABNORMAL LOW (ref >60)
Glucose, Bld: 232 mg/dL — ABNORMAL HIGH (ref 70–99)
Potassium: 4.1 mmol/L (ref 3.5–5.1)
Sodium: 136 mmol/L (ref 135–145)
Total Bilirubin: 0.6 mg/dL (ref 0.3–1.2)
Total Protein: 7.4 g/dL (ref 6.5–8.1)

## 2020-01-16 LAB — CANCER ANTIGEN 27.29: CA 27.29: 19.7 U/mL (ref 0.0–38.6)

## 2020-02-16 DIAGNOSIS — Z124 Encounter for screening for malignant neoplasm of cervix: Secondary | ICD-10-CM | POA: Diagnosis not present

## 2020-02-16 DIAGNOSIS — Z6835 Body mass index (BMI) 35.0-35.9, adult: Secondary | ICD-10-CM | POA: Diagnosis not present

## 2020-02-16 DIAGNOSIS — N951 Menopausal and female climacteric states: Secondary | ICD-10-CM | POA: Diagnosis not present

## 2020-02-16 DIAGNOSIS — Z01419 Encounter for gynecological examination (general) (routine) without abnormal findings: Secondary | ICD-10-CM | POA: Diagnosis not present

## 2020-02-16 DIAGNOSIS — Z1211 Encounter for screening for malignant neoplasm of colon: Secondary | ICD-10-CM | POA: Diagnosis not present

## 2020-02-19 ENCOUNTER — Other Ambulatory Visit: Payer: Self-pay | Admitting: *Deleted

## 2020-02-29 ENCOUNTER — Other Ambulatory Visit: Payer: Self-pay | Admitting: *Deleted

## 2020-02-29 MED ORDER — PALBOCICLIB 100 MG PO TABS: ORAL_TABLET | ORAL | 12 refills | Status: DC

## 2020-04-01 ENCOUNTER — Inpatient Hospital Stay: Payer: BC Managed Care – PPO | Attending: Adult Health

## 2020-04-01 DIAGNOSIS — R232 Flushing: Secondary | ICD-10-CM | POA: Insufficient documentation

## 2020-04-01 DIAGNOSIS — Z79899 Other long term (current) drug therapy: Secondary | ICD-10-CM | POA: Insufficient documentation

## 2020-04-01 DIAGNOSIS — R160 Hepatomegaly, not elsewhere classified: Secondary | ICD-10-CM | POA: Insufficient documentation

## 2020-04-01 DIAGNOSIS — E785 Hyperlipidemia, unspecified: Secondary | ICD-10-CM | POA: Insufficient documentation

## 2020-04-01 DIAGNOSIS — K589 Irritable bowel syndrome without diarrhea: Secondary | ICD-10-CM | POA: Insufficient documentation

## 2020-04-01 DIAGNOSIS — Z79811 Long term (current) use of aromatase inhibitors: Secondary | ICD-10-CM | POA: Insufficient documentation

## 2020-04-01 DIAGNOSIS — F419 Anxiety disorder, unspecified: Secondary | ICD-10-CM | POA: Insufficient documentation

## 2020-04-01 DIAGNOSIS — I341 Nonrheumatic mitral (valve) prolapse: Secondary | ICD-10-CM | POA: Insufficient documentation

## 2020-04-01 DIAGNOSIS — K760 Fatty (change of) liver, not elsewhere classified: Secondary | ICD-10-CM | POA: Insufficient documentation

## 2020-04-01 DIAGNOSIS — K219 Gastro-esophageal reflux disease without esophagitis: Secondary | ICD-10-CM | POA: Insufficient documentation

## 2020-04-01 DIAGNOSIS — Z923 Personal history of irradiation: Secondary | ICD-10-CM | POA: Insufficient documentation

## 2020-04-01 DIAGNOSIS — E1165 Type 2 diabetes mellitus with hyperglycemia: Secondary | ICD-10-CM | POA: Insufficient documentation

## 2020-04-01 DIAGNOSIS — Z170 Estrogen receptor positive status [ER+]: Secondary | ICD-10-CM | POA: Insufficient documentation

## 2020-04-01 DIAGNOSIS — R197 Diarrhea, unspecified: Secondary | ICD-10-CM | POA: Insufficient documentation

## 2020-04-01 DIAGNOSIS — C50312 Malignant neoplasm of lower-inner quadrant of left female breast: Secondary | ICD-10-CM | POA: Insufficient documentation

## 2020-04-03 ENCOUNTER — Ambulatory Visit (HOSPITAL_COMMUNITY)
Admission: RE | Admit: 2020-04-03 | Discharge: 2020-04-03 | Disposition: A | Discharge status: Home / Self Care | Payer: BC Managed Care – PPO | Source: Ambulatory Visit | Attending: Oncology | Admitting: Oncology

## 2020-04-03 ENCOUNTER — Inpatient Hospital Stay: Payer: BC Managed Care – PPO

## 2020-04-03 ENCOUNTER — Other Ambulatory Visit: Payer: Self-pay

## 2020-04-03 DIAGNOSIS — C50919 Malignant neoplasm of unspecified site of unspecified female breast: Secondary | ICD-10-CM | POA: Diagnosis not present

## 2020-04-03 DIAGNOSIS — C50312 Malignant neoplasm of lower-inner quadrant of left female breast: Secondary | ICD-10-CM

## 2020-04-03 DIAGNOSIS — R160 Hepatomegaly, not elsewhere classified: Secondary | ICD-10-CM | POA: Diagnosis not present

## 2020-04-03 DIAGNOSIS — K589 Irritable bowel syndrome without diarrhea: Secondary | ICD-10-CM | POA: Diagnosis not present

## 2020-04-03 DIAGNOSIS — Z170 Estrogen receptor positive status [ER+]: Secondary | ICD-10-CM | POA: Diagnosis not present

## 2020-04-03 DIAGNOSIS — K760 Fatty (change of) liver, not elsewhere classified: Secondary | ICD-10-CM | POA: Diagnosis not present

## 2020-04-03 DIAGNOSIS — J701 Chronic and other pulmonary manifestations due to radiation: Secondary | ICD-10-CM | POA: Diagnosis not present

## 2020-04-03 DIAGNOSIS — E119 Type 2 diabetes mellitus without complications: Secondary | ICD-10-CM | POA: Diagnosis not present

## 2020-04-03 DIAGNOSIS — C7800 Secondary malignant neoplasm of unspecified lung: Secondary | ICD-10-CM

## 2020-04-03 DIAGNOSIS — I341 Nonrheumatic mitral (valve) prolapse: Secondary | ICD-10-CM | POA: Diagnosis not present

## 2020-04-03 DIAGNOSIS — R232 Flushing: Secondary | ICD-10-CM | POA: Diagnosis not present

## 2020-04-03 DIAGNOSIS — E1165 Type 2 diabetes mellitus with hyperglycemia: Secondary | ICD-10-CM | POA: Diagnosis not present

## 2020-04-03 DIAGNOSIS — K219 Gastro-esophageal reflux disease without esophagitis: Secondary | ICD-10-CM | POA: Diagnosis not present

## 2020-04-03 DIAGNOSIS — J320 Chronic maxillary sinusitis: Secondary | ICD-10-CM | POA: Diagnosis not present

## 2020-04-03 DIAGNOSIS — Z923 Personal history of irradiation: Secondary | ICD-10-CM | POA: Diagnosis not present

## 2020-04-03 DIAGNOSIS — J3489 Other specified disorders of nose and nasal sinuses: Secondary | ICD-10-CM | POA: Diagnosis not present

## 2020-04-03 DIAGNOSIS — Z79811 Long term (current) use of aromatase inhibitors: Secondary | ICD-10-CM | POA: Diagnosis not present

## 2020-04-03 DIAGNOSIS — R197 Diarrhea, unspecified: Secondary | ICD-10-CM | POA: Diagnosis not present

## 2020-04-03 DIAGNOSIS — Z79899 Other long term (current) drug therapy: Secondary | ICD-10-CM | POA: Diagnosis not present

## 2020-04-03 DIAGNOSIS — F419 Anxiety disorder, unspecified: Secondary | ICD-10-CM | POA: Diagnosis not present

## 2020-04-03 DIAGNOSIS — E785 Hyperlipidemia, unspecified: Secondary | ICD-10-CM | POA: Diagnosis not present

## 2020-04-03 LAB — CBC WITH DIFFERENTIAL/PLATELET
Abs Immature Granulocytes: 0.08 10*3/uL — ABNORMAL HIGH (ref 0.00–0.07)
Basophils Absolute: 0.1 10*3/uL (ref 0.0–0.1)
Basophils Relative: 2 %
Eosinophils Absolute: 0.1 10*3/uL (ref 0.0–0.5)
Eosinophils Relative: 2 %
HCT: 39.1 % (ref 36.0–46.0)
Hemoglobin: 13.9 g/dL (ref 12.0–15.0)
Immature Granulocytes: 2 %
Lymphocytes Relative: 41 %
Lymphs Abs: 1.6 10*3/uL (ref 0.7–4.0)
MCH: 31.1 pg (ref 26.0–34.0)
MCHC: 35.5 g/dL (ref 30.0–36.0)
MCV: 87.5 fL (ref 80.0–100.0)
Monocytes Absolute: 0.6 10*3/uL (ref 0.1–1.0)
Monocytes Relative: 16 %
Neutro Abs: 1.4 10*3/uL — ABNORMAL LOW (ref 1.7–7.7)
Neutrophils Relative %: 37 %
Platelets: 223 10*3/uL (ref 150–400)
RBC: 4.47 MIL/uL (ref 3.87–5.11)
RDW: 13.2 % (ref 11.5–15.5)
WBC: 3.8 10*3/uL — ABNORMAL LOW (ref 4.0–10.5)
nRBC: 0 % (ref 0.0–0.2)

## 2020-04-03 LAB — COMPREHENSIVE METABOLIC PANEL
ALT: 51 U/L — ABNORMAL HIGH (ref 0–44)
AST: 27 U/L (ref 15–41)
Albumin: 4.1 g/dL (ref 3.5–5.0)
Alkaline Phosphatase: 124 U/L (ref 38–126)
Anion gap: 9 (ref 5–15)
BUN: 12 mg/dL (ref 6–20)
CO2: 23 mmol/L (ref 22–32)
Calcium: 9.2 mg/dL (ref 8.9–10.3)
Chloride: 103 mmol/L (ref 98–111)
Creatinine, Ser: 0.79 mg/dL (ref 0.44–1.00)
GFR, Estimated: >60 mL/min (ref >60)
Glucose, Bld: 285 mg/dL — ABNORMAL HIGH (ref 70–99)
Potassium: 3.9 mmol/L (ref 3.5–5.1)
Sodium: 135 mmol/L (ref 135–145)
Total Bilirubin: 0.5 mg/dL (ref 0.3–1.2)
Total Protein: 7.1 g/dL (ref 6.5–8.1)

## 2020-04-03 LAB — GLUCOSE, CAPILLARY
Glucose-Capillary: 275 mg/dL — ABNORMAL HIGH (ref 70–99)
Glucose-Capillary: 271 mg/dL — ABNORMAL HIGH (ref 70–99)

## 2020-04-03 MED ORDER — FLUDEOXYGLUCOSE F - 18 (FDG) INJECTION
10.2000 | Freq: Once | INTRAVENOUS | Status: AC | PRN
  Administered 2020-04-03: 10.2 via INTRAVENOUS

## 2020-04-04 LAB — CANCER ANTIGEN 27.29: CA 27.29: 20.8 U/mL (ref 0.0–38.6)

## 2020-04-07 NOTE — Progress Notes (Signed)
[place]  [phone] Fax:([phone]) [phone]    ID: Debbie [name] OB: July 27, 1965  MR#: [id number]  IOE#:[id number]  Patient Care Team: Harlan Stains, MD as PCP - General (Family Medicine) [name], [name], MD as Consulting Physician (Oncology) [name], MD as Referring Physician (Surgery) [name], MD as Consulting Physician (Obstetrics and Gynecology) [name], MD as Consulting Physician (General Surgery) [name], MD as Consulting Physician (Radiation Oncology) [name], [name], MD as Consulting Physician (Allergy and Immunology) [name], MD as Consulting Physician (Gastroenterology) [name], MD as Referring Physician (Internal Medicine) [name], [name], MD as Referring Physician (Oncology) OTHER MD:    CHIEF COMPLAINT: locally advanced estrogen receptor positive breast cancer  CURRENT TREATMENT:  Anastrozole, palbociclib   INTERVAL HISTORY: [name] returns today for follow-up of her estrogen receptor positive metastatic breast cancer.  She was accompanied by her husband.  She continues on anastrozole.  Hot flashes are relatively mild, tend to occur at night.  Vaginal dryness is not a major concern and she denies dyspareunia.  She also continues on palbociclib. She does feel more tired than she probably would feel if she were not on this medication.  She has problems with diarrhea as well.  She "never has a normal bowel movement".  She will have several loose bowel movements over 1 hour 1 day, then no bowel movements for 2 days and then the cycle repeats.  She uses Imodium irregularly.  Since her last visit, she underwent restaging PET scan on 04/03/2020 showing: no hypermetabolic metastatic disease, including at site of left anterior pleural thickening on prior chest CT; hepatic steatosis and hepatomegaly; left maxillary sinus disease.   REVIEW OF SYSTEMS: Debbie [name] has a very erratic sleep pattern.   She may go to bed at 3 in the morning and then sleep until 3 PM or really sleep at any other time.  During the day she does a little bit of laundry and some housekeeping.  She is not exercising.  Her diabetes is poorly controlled and she is not following a proper diet.  A detailed review of systems today was otherwise stable   COVID 19 VACCINATION STATUS: fully vaccinated, booster pending   BREAST CANCER HISTORY: As per previously documented note:  Debbie [name] had routine yearly screening mammography at the breast center 06/19/2013 showing a possible mass in the left breast. The right breast was unremarkable. On 06/30/2013 additional views of the left breast showed an irregular spiculated dense mass measuring 2.2 cm in the lower inner quadrant. This was palpable at the 8:00 location. Ultrasound showed an irregular hypoechoic mass measuring 2.3 cm and in the left axilla a dominant abnormal appearing lymph node with cortical thickening measuring 1.2 cm.  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  Subsequently the patient underwent bilateral breast MRI. This showed her breasts to be dense (composition C.). In the left breast at the 8:00 position there was a 2.4 cm enhancing mass containing a biopsy clip. There were no additional areas of concern. In the axilla on the left there was a 2.6 cm enlarged left axillary lymph node. There was also a  0.5 cm left internal mammary lymph nodes noted. The right side was unremarkable.  The patient's subsequent history is as detailed below.   PAST MEDICAL HISTORY: Past Medical History:  Diagnosis Date  . Abnormal  Pap smear   . Anxiety   . Breast cancer ([place]) 2015   ER+/PR+/Her2- Left - Dr. Earlie Lou  . GERD (gastroesophageal reflux disease)    occasional  . Hyperlipidemia    no meds   . IBS (irritable bowel syndrome)   . IBS (irritable bowel syndrome)   . Menorrhagia   . Migraines    menstral  . Migraines   . MVP (mitral valve prolapse)    echo 2/15-mitral valve normal-no regurg,no stenosis  . Pre-diabetes    no meds  . Radiation 02/14/14-04/03/14   Left Breast  . Seasonal allergies   . SVD (spontaneous vaginal delivery)    x 3    PAST SURGICAL HISTORY: Past Surgical History:  Procedure Laterality Date  . BREAST SURGERY Left 2015   lumpectomy x 2 - Dr. Jana Hakim  . COLONOSCOPY    . DILATATION & CURRETTAGE/HYSTEROSCOPY WITH RESECTOCOPE N/A 09/14/2017   Procedure: DILATATION & CURETTAGE/HYSTEROSCOPY WITH RESECTOCOPE;  Surgeon: [name], MD;  Location: [facility];  Service: Gynecology;  Laterality: N/A;  . ENDOMETRIAL ABLATION  2013  . PORT-A-CATH REMOVAL N/A 05/15/2014   Procedure: REMOVAL PORT-A-CATH;  Surgeon: [name], MD;  Location: [facility];  Service: General;  Laterality: N/A;  . PORTACATH PLACEMENT Left 07/18/2013   Procedure: INSERTION PORT-A-CATH;  Surgeon: [name], MD;  Location: [facility];  Service: General;  Laterality: Left;  . SVD      x 3  . VULVA /PERINEUM BIOPSY  2011  . WISDOM TEETH REMOVAL      FAMILY HISTORY Family History  Problem Relation Age of Onset  . Cancer Maternal Grandmother        BREAST AND UTERINE CANCER  . Uterine cancer Maternal Grandmother 84  . Hyperlipidemia Mother   . Hypertension Mother   . Multiple myeloma Father 4  . Lung cancer Maternal Manufacturing engineer  . Testicular cancer Other 1   The patient's father is living, currently age 80 (as of November 2020).. He has a history of multiple myeloma. The patient's mother is 10 and has Parkinson's.  She was diagnosed with ductal carcinoma in  situ in 2016. The patient's parents live in [place], [place]. The patient had no brothers, 2 sisters. There is no history of breast or ovarian cancer in the family. She believes one of her grandfathers died from lung cancer and another from pancreatic cancer, but is not sure   GYNECOLOGIC HISTORY:  Menarche age 57, first live birth age 57, which the patient understands is an independent risk factor for breast cancer. She is GX P3. She was still having regular periods at the start of chemotherapy. LMP was March 2015. She tells me Dr. Cletis Media checked her labs December 2015 and found that she was in the menopausal range. Note that her husband is s/p vasectomy.    SOCIAL HISTORY: (updated May 2021). 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 Her 3 children are [name] (20), [name] (17) and [name] (14).  Martie Round is planning to go back to [employer].  Harmon Pier is a Paramedic in Geophysical data [employer]  school.  The patient attends a local [employer].    ADVANCED DIRECTIVES: In the absence of any document to the contrary the patient's husband is her healthcare power of attorney   HEALTH MAINTENANCE: Social History   Tobacco Use  . Smoking status: Never Smoker  . Smokeless tobacco: Never Used  Vaping Use  . Vaping Use: Never used  Substance Use Topics  . Alcohol use: No  . Drug use: No     Colonoscopy: January 2015  JJO:ACZYSAYT 2014  Bone density:  Lipid panel:  Allergies  Allergen Reactions  . Dust Mite Extract Itching    Current Outpatient Medications  Medication Sig Dispense Refill  . ALPRAZolam (XANAX) 0.5 MG tablet Take 20 minutes before MRI; may repeat once (Patient not taking: Reported on 04/21/2018) 5 tablet 0  . anastrozole (ARIMIDEX) 1 MG tablet Take 1 tablet (1 mg total) by mouth daily. 90 tablet 4  . cetirizine (ZYRTEC) 10 MG tablet  Take 10 mg by mouth daily.    . Cholecalciferol (VITAMIN D3) 2000 units TABS Take by mouth daily.     . cholestyramine (QUESTRAN) 4 g packet Take 1 packet (4 g total) by mouth daily as needed. 12 each 12  . frovatriptan (FROVA) 2.5 MG tablet Take 1 tablet (2.5 mg total) by mouth as needed for migraine. If recurs, may repeat after 2 hours. Max of 3 tabs in 24 hours. 10 tablet 0  . Lancets (ONETOUCH DELICA PLUS [id number]) MISC     . omeprazole (PRILOSEC) 40 MG capsule TAKE ONE CAPSULE BY MOUTH DAILY AT BEDTIME 60 capsule 0  . oxymetazoline (AFRIN) 0.05 % nasal spray Place 2 sprays into the nose daily as needed. congestion    . palbociclib (IBRANCE) 100 MG tablet TAKE 1 TABLET DAILY. TAKE FOR 21 DAYS ON, 7 DAYS OFF, REPEAT EVERY 28 DAYS 21 tablet 12  . PARoxetine (PAXIL) 20 MG tablet Take 20 mg by mouth every morning.     No current facility-administered medications for this visit.    OBJECTIVE:  white woman in no acute distress  Vitals:   04/08/20 1543  BP: 125/84  Pulse: 91  Resp: 17  Temp: 97.7 F (36.5 C)  SpO2: 95%    Wt Readings from Last 3 Encounters:  04/08/20 205 lb 8 oz (93.2 kg)  10/16/19 205 lb 1.6 oz (93 kg)  07/25/19 201 lb 14.4 oz (91.6 kg)   Body mass index is 35.27 kg/m.      ECOG FS:1 - Symptomatic but completely ambulatory   Sclerae unicteric, EOMs intact Wearing a mask No cervical or supraclavicular adenopathy Lungs no rales or rhonchi Heart regular rate and rhythm Abd soft, nontender, positive bowel sounds MSK no focal spinal tenderness, no upper extremity lymphedema Neuro: nonfocal, well oriented, appropriate affect Breasts: The right breast is benign.  The left breast has undergone lumpectomy followed by radiation.  There is no evidence of local recurrence.  Both axillae are benign.   LABS:     Component Value Date/Time   NA 135 04/03/2020 0952   NA 139 04/28/2017 1326   K 3.9 04/03/2020 0952   K 4.1 04/28/2017 1326   CL 103 04/03/2020 0952    CO2 23 04/03/2020 0952   CO2 20 (L) 04/28/2017 1326   GLUCOSE 285 (H) 04/03/2020 0952   GLUCOSE 152 (H) 04/28/2017 1326   BUN 12 04/03/2020 0952   BUN 12.7 04/28/2017 1326   CREATININE 0.79 04/03/2020 0952   CREATININE 0.8 04/28/2017 1326  CALCIUM 9.2 04/03/2020 0952   CALCIUM 9.0 04/28/2017 1326   PROT 7.1 04/03/2020 0952   PROT 7.3 04/28/2017 1326   ALBUMIN 4.1 04/03/2020 0952   ALBUMIN 4.1 04/28/2017 1326   AST 27 04/03/2020 0952   AST 23 04/28/2017 1326   ALT 51 (H) 04/03/2020 0952   ALT 37 04/28/2017 1326   ALKPHOS 124 04/03/2020 0952   ALKPHOS 94 04/28/2017 1326   BILITOT 0.5 04/03/2020 0952   BILITOT 0.42 04/28/2017 1326   GFRNONAA >60 04/03/2020 0952   GFRAA >60 01/15/2020 1545    I No results found for: SPEP  Lab Results  Component Value Date   WBC 3.8 (L) 04/03/2020   NEUTROABS 1.4 (L) 04/03/2020   HGB 13.9 04/03/2020   HCT 39.1 04/03/2020   MCV 87.5 04/03/2020   PLT 223 04/03/2020      Chemistry      Component Value Date/Time   NA 135 04/03/2020 0952   NA 139 04/28/2017 1326   K 3.9 04/03/2020 0952   K 4.1 04/28/2017 1326   CL 103 04/03/2020 0952   CO2 23 04/03/2020 0952   CO2 20 (L) 04/28/2017 1326   BUN 12 04/03/2020 0952   BUN 12.7 04/28/2017 1326   CREATININE 0.79 04/03/2020 0952   CREATININE 0.8 04/28/2017 1326      Component Value Date/Time   CALCIUM 9.2 04/03/2020 0952   CALCIUM 9.0 04/28/2017 1326   ALKPHOS 124 04/03/2020 0952   ALKPHOS 94 04/28/2017 1326   AST 27 04/03/2020 0952   AST 23 04/28/2017 1326   ALT 51 (H) 04/03/2020 0952   ALT 37 04/28/2017 1326   BILITOT 0.5 04/03/2020 0952   BILITOT 0.42 04/28/2017 1326       No results found for: LABCA2  No components found for: LABCA125  No results for input(s): INR in the last 168 hours.  Urinalysis    Component Value Date/Time   BILIRUBINUR Negative 09/22/2011 1451   PROTEINUR Trace 09/22/2011 1451   UROBILINOGEN negative 09/22/2011 1451   NITRITE Negative  09/22/2011 1451   LEUKOCYTESUR Trace 09/22/2011 1451    Lab Results  Component Value Date   CA2729 20.8 04/03/2020   CA2729 19.7 01/15/2020   CA2729 27.0 10/11/2019   CA2729 17.2 07/25/2019   CA2729 29.5 04/17/2019    STUDIES: NM PET Image Restag (PS) Skull Base To Thigh  Result Date: 04/03/2020 CLINICAL DATA:  Subsequent treatment strategy for restaging of breast cancer. COVID vaccine in right arm most recent April. EXAM: NUCLEAR MEDICINE PET SKULL BASE TO THIGH TECHNIQUE: 10.2 mCi F-18 FDG was injected intravenously. Full-ring PET imaging was performed from the skull base to thigh after the radiotracer. CT data was obtained and used for attenuation correction and anatomic localization. Fasting blood glucose: 271 mg/dl COMPARISON:  Chest CT 10/11/2019.  PET 04/12/2019. FINDINGS: Mediastinal blood pool activity: SUV max 2.9 Liver activity: SUV max Not applicable. NECK: No areas of abnormal hypermetabolism. Incidental CT findings: No cervical adenopathy. Left maxillary sinus fluid. CHEST: No significant hypermetabolism at the site of anterior left pleural thickening at the level of the 1/2 intercostal space. Example at a S.U.V. max of 1.8 on 50/4. Other areas of left-sided pleural thickening are without hypermetabolism. No thoracic nodal hypermetabolism. Incidental CT findings: Left axillary node dissection. Left medial breast surgical clips. Minimal anterior left upper lobe radiation fibrosis. ABDOMEN/PELVIS: No abdominopelvic parenchymal or nodal hypermetabolism. Incidental CT findings: Moderate hepatic steatosis with prominent caudate lobe. Moderate hepatomegaly. Normal adrenal glands. Pelvic floor  laxity. SKELETON: No abnormal marrow activity. Incidental CT findings: none IMPRESSION: 1. No hypermetabolic metastatic disease, including at the site of left anterior pleural thickening detailed on prior chest CT. 2. Hepatic steatosis and hepatomegaly. Caudate lobe enlargement which is nonspecific.  Correlate with risk factors for mild cirrhosis. 3. Left maxillary sinus disease. Electronically Signed   By: Abigail Miyamoto M.D.   On: 04/03/2020 13:54    ASSESSMENT: 54 y.o. [place] woman status post left breast lower inner quadrant and left axillary lymph node biopsy 06/30/2013 for a clinical T2 N1-2, stage IIB/IIIA invasive ductal carcinoma, grade 2, estrogen and progesterone receptors both strongly positive, HER-2 nonamplified, with an MIB-1 of 36%.  (1) there is a 5 mm left internal mammary lymph node which was negative on PET scan  (2) cyclophosphamide and doxorubicin started 08/04/2014, given in dose dense fashion x4 with Neulasta day 2; completed 09/14/2013, followed by paclitaxel again given in dose dense fashion x4, completed 11/09/2013  (3) left lumpectomy and axillary lymph node dissection 12/13/2013 at [facility] showed a residual pT2 pN1 (mic), stage IIB invasive ductal carcinoma; margins positive for DCIS cleared with subsequent surgery (01/03/2014)  (4) adjuvant radiation therapy completed 04/03/2014  (5) tamoxifen started December 2015, discontinued October 2018  (6) hepatic steatosis: documented on abdomianl US 08/07/2014, with elevated but stable LFTs,  (7) genetic testing 07/24/2013 through the  "Women's Hereditary Panel", performed at [facility], found no deleterious mutations in  ATM, BRCA1, BRCA2, BRIP1, CDH1, CHEK2 (c.1100delC only), EPCAM, MLH1, MSH2, MSH6, NBN, PALB2, PMS2, PTEN, RAD51C, STK11, and TP53.  (8) METASTATIC DISEASE: October 2018 -- chest CT scan 03/17/2017 shows left pleural thickening and a left pleural effusion; PET scan 03/24/2017 shows no liver or bone involvement and no involvement of the right lung.  (a) biopsy of the left pleural mass 04/06/2017 confirms metastatic breast cancer, estrogen and progesterone receptor positive, HER-2 negative  (b) CA 27-27 was 86.3 on 03/30/2017  (9) anastrozole started October 2018  (a) palbociclib added  04/12/2017 at 125 mg/day 21/7  (b) CT chest on 01/26/2018 shows improvement in nodular and plaque like pleural metastatic disease without complete resolution  (c) PET scan on 04/19/18 consistent with continued improvement of metastatic disease  (d) CT scan of the chest 10/05/2018 shows no active disease.  (e) palbociclib dose decreased to 100 mg May 2020 because we are checking patient's labs less frequently during the pandemic  (f) PET scan 04/12/2019 shows continuing response   PLAN: Debbie [name] is now just over 3 years out from definitive diagnosis of metastatic disease.  Her disease is very well controlled.  PET scan shows no evidence of cancer activity and she has no symptoms related to the disease itself.  This is very favorable.  She is tolerating treatment moderately well.  Diarrhea is a major issue.  I suggested she use the Imodium more regularly.  I also thought it might be worth giving Questran a try.  She could take Questran daily and see if that helps regularize her bowel movements.  I strongly urged her to regulate her sleep habits.  She needs to get out of bed every morning at the same time regardless of when she went to bed the night before.  If she does this consistently over several weeks her body will learn to go to sleep at the appropriate time in the evening so she gets enough rest.  I am very concerned that her diabetes is not controlled and she has fatty liver and early  cirrhosis changes.  She understands of the most common cause of cirrhosis in the states these days is fatty liver.  This is reversible.  The treatment is exactly the same treatment as for her diabetes namely she needs to exercise 45 minutes 5 days a week and she needs to cut the carbohydrates.  I gave her a very simple but clear diet which would be strong and vegetables, meat and dairy and very few if any carbs (she can have brown rice, brown bread, and small portions, but no white rice or white bread, basically no  potatoes and no pasta).  I also suggested she meet with a nutritionist through her primary care physician with a goal of getting her hemoglobin A1c under 6.0.  Otherwise we are going to continue the pattern which is seeing me in May 2022 with a CT scan of the chest prior to the visit and then a repeat PET scan a year from now  She knows to call for any other issue admittable before the next visit  Total encounter time 40 minutes.Sarajane Jews C. Ariane Ditullio, MD  04/08/20 5:21 PM Medical Oncology and Hematology [facility] [place], [place] [address] Tel. [phone]    Fax. [phone]   I, Wilburn Mylar, am acting as scribe for Dr. [name]. Samreen Seltzer.  I, Lurline Del MD, have reviewed the above documentation for accuracy and completeness, and I agree with the above.   *Total Encounter Time as defined by the Centers for Medicare and Medicaid Services includes, in addition to the face-to-face time of a patient visit (documented in the note above) non-face-to-face time: obtaining and reviewing outside history, ordering and reviewing medications, tests or procedures, care coordination (communications with other health care professionals or caregivers) and documentation in the medical record.

## 2020-04-08 ENCOUNTER — Other Ambulatory Visit: Payer: Self-pay

## 2020-04-08 ENCOUNTER — Inpatient Hospital Stay (HOSPITAL_BASED_OUTPATIENT_CLINIC_OR_DEPARTMENT_OTHER): Payer: BC Managed Care – PPO | Admitting: Oncology

## 2020-04-08 VITALS — BP 125/84 | HR 91 | Temp 97.7°F | Resp 17 | Ht 64.0 in | Wt 205.5 lb

## 2020-04-08 DIAGNOSIS — R232 Flushing: Secondary | ICD-10-CM | POA: Diagnosis not present

## 2020-04-08 DIAGNOSIS — Z170 Estrogen receptor positive status [ER+]: Secondary | ICD-10-CM

## 2020-04-08 DIAGNOSIS — E1165 Type 2 diabetes mellitus with hyperglycemia: Secondary | ICD-10-CM | POA: Diagnosis not present

## 2020-04-08 DIAGNOSIS — R160 Hepatomegaly, not elsewhere classified: Secondary | ICD-10-CM | POA: Diagnosis not present

## 2020-04-08 DIAGNOSIS — K760 Fatty (change of) liver, not elsewhere classified: Secondary | ICD-10-CM | POA: Diagnosis not present

## 2020-04-08 DIAGNOSIS — C50312 Malignant neoplasm of lower-inner quadrant of left female breast: Secondary | ICD-10-CM | POA: Diagnosis not present

## 2020-04-08 DIAGNOSIS — C7800 Secondary malignant neoplasm of unspecified lung: Secondary | ICD-10-CM

## 2020-04-08 DIAGNOSIS — Z79811 Long term (current) use of aromatase inhibitors: Secondary | ICD-10-CM | POA: Diagnosis not present

## 2020-04-08 DIAGNOSIS — I341 Nonrheumatic mitral (valve) prolapse: Secondary | ICD-10-CM | POA: Diagnosis not present

## 2020-04-08 DIAGNOSIS — K219 Gastro-esophageal reflux disease without esophagitis: Secondary | ICD-10-CM | POA: Diagnosis not present

## 2020-04-08 DIAGNOSIS — R197 Diarrhea, unspecified: Secondary | ICD-10-CM | POA: Diagnosis not present

## 2020-04-08 DIAGNOSIS — F419 Anxiety disorder, unspecified: Secondary | ICD-10-CM | POA: Diagnosis not present

## 2020-04-08 DIAGNOSIS — Z79899 Other long term (current) drug therapy: Secondary | ICD-10-CM | POA: Diagnosis not present

## 2020-04-08 DIAGNOSIS — Z923 Personal history of irradiation: Secondary | ICD-10-CM | POA: Diagnosis not present

## 2020-04-08 DIAGNOSIS — E785 Hyperlipidemia, unspecified: Secondary | ICD-10-CM | POA: Diagnosis not present

## 2020-04-08 DIAGNOSIS — K589 Irritable bowel syndrome without diarrhea: Secondary | ICD-10-CM | POA: Diagnosis not present

## 2020-04-08 MED ORDER — ANASTROZOLE 1 MG PO TABS: 1.0000 mg | ORAL_TABLET | Freq: Every day | ORAL | 4 refills | Status: DC

## 2020-04-08 MED ORDER — CHOLESTYRAMINE 4 G PO PACK: 4.0000 g | PACK | Freq: Every day | ORAL | 12 refills | Status: AC | PRN

## 2020-04-08 MED ORDER — PALBOCICLIB 100 MG PO TABS: ORAL_TABLET | ORAL | 12 refills | Status: DC

## 2020-04-22 DIAGNOSIS — C50912 Malignant neoplasm of unspecified site of left female breast: Secondary | ICD-10-CM | POA: Diagnosis not present

## 2020-04-24 DIAGNOSIS — E559 Vitamin D deficiency, unspecified: Secondary | ICD-10-CM | POA: Diagnosis not present

## 2020-04-24 DIAGNOSIS — E1169 Type 2 diabetes mellitus with other specified complication: Secondary | ICD-10-CM | POA: Diagnosis not present

## 2020-04-24 DIAGNOSIS — Z23 Encounter for immunization: Secondary | ICD-10-CM | POA: Diagnosis not present

## 2020-04-24 DIAGNOSIS — C50919 Malignant neoplasm of unspecified site of unspecified female breast: Secondary | ICD-10-CM | POA: Diagnosis not present

## 2020-04-24 DIAGNOSIS — E785 Hyperlipidemia, unspecified: Secondary | ICD-10-CM | POA: Diagnosis not present

## 2020-05-09 DIAGNOSIS — E559 Vitamin D deficiency, unspecified: Secondary | ICD-10-CM | POA: Diagnosis not present

## 2020-05-09 DIAGNOSIS — E1169 Type 2 diabetes mellitus with other specified complication: Secondary | ICD-10-CM | POA: Diagnosis not present

## 2020-06-19 DIAGNOSIS — Z1152 Encounter for screening for COVID-19: Secondary | ICD-10-CM | POA: Diagnosis not present

## 2020-06-22 IMAGING — CT NM PET TUM IMG RESTAG (PS) SKULL BASE T - THIGH
7 of 8 series · 21 of 25 positions shown · non-contrast
Comparison: 01/25/2018 chest CT.  03/24/2017 PET-CT.

CLINICAL DATA: Subsequent treatment strategy for stage IV
metastatic left breast cancer originally diagnosed in 0324, with
left pleural recurrence.

EXAM:
NUCLEAR MEDICINE PET SKULL BASE TO THIGH
TECHNIQUE: 10.5 mCi F-18 FDG was injected intravenously. Full-ring PET imaging
was performed from the skull base to thigh after the radiotracer. CT
data was obtained and used for attenuation correction and anatomic
localization.
Fasting blood glucose: 124 mg/dl

[Series 3: pet sk_thigh ac · axial · 5.0mm · 4.07mm/px · z∈[-1534,-590]mm · 4 of 237 slices shown]
[im 1/237]
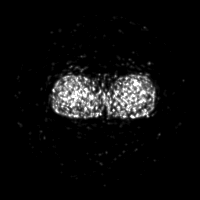
[im 60/237]
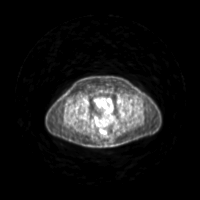
[im 119/237]
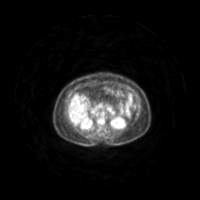
[im 237/237]
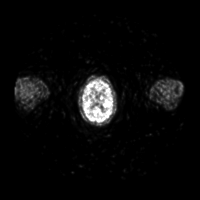

[Series 4: ct sk_thigh 5.0 hd_fov · axial · 5.0mm · 1.33mm/px · z∈[-1534,-826]mm · 4 of 237 slices shown]
[im 1/237]
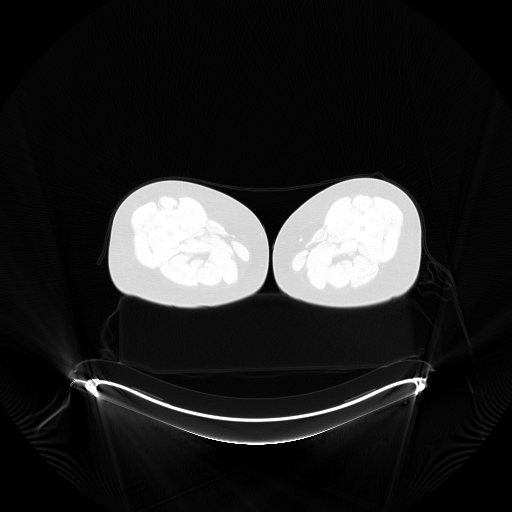
[im 60/237  brain]
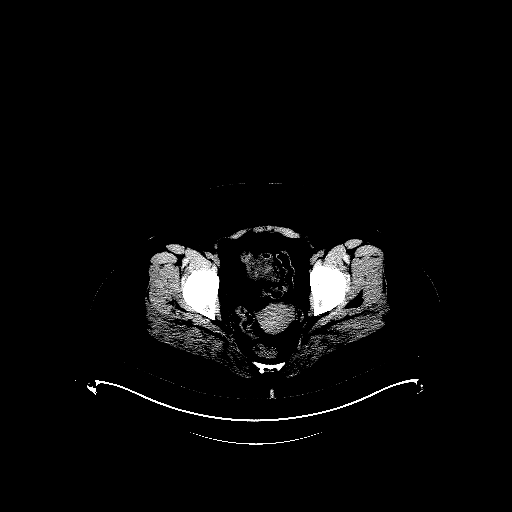
[im 119/237]
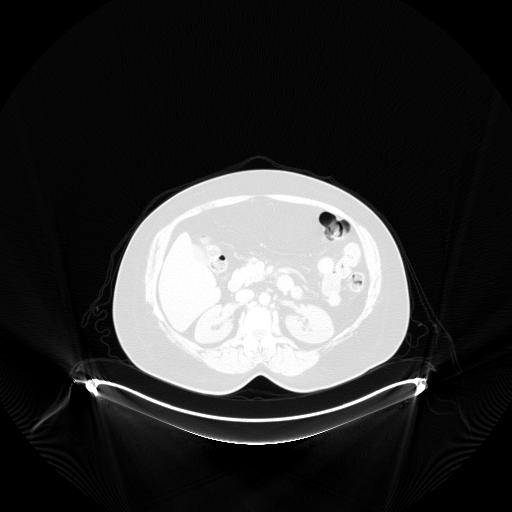
[im 178/237]
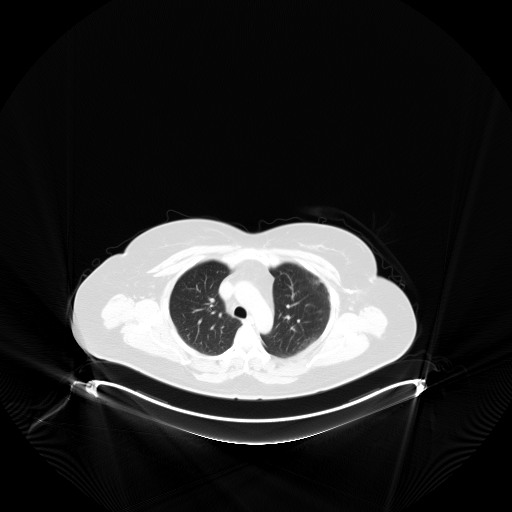

[Series 5: pet sk_thigh nac · axial · 5.0mm · 4.07mm/px · z∈[-1534,-590]mm · 5 of 237 slices shown]
[im 1/237]
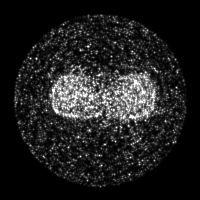
[im 60/237]
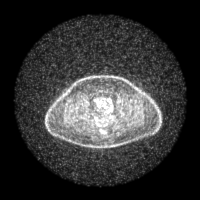
[im 119/237]
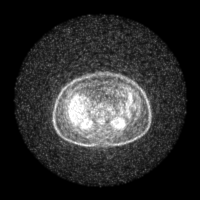
[im 178/237]
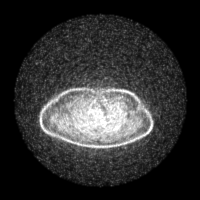
[im 237/237]
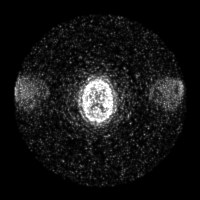

[Series 603: range-ct sk_thigh 5.0 hd_fov-cor-<alpha range> · 2 of 99 slices shown]
[im 1/99]
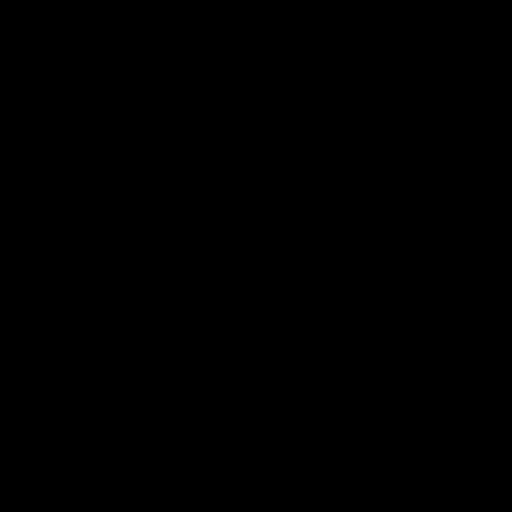
[im 99/99]
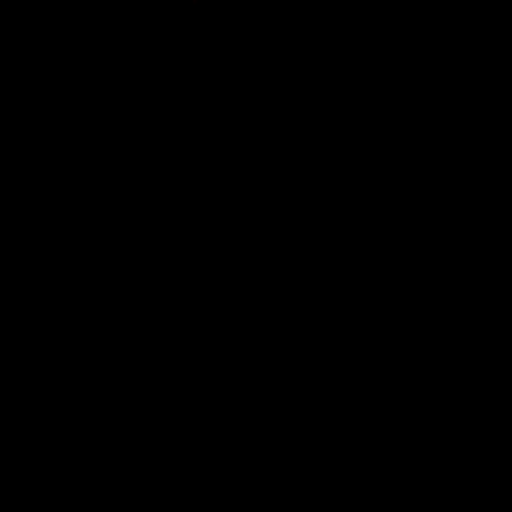

[Series 604: mip range · coronal · 1.96mm/px · 1 of 32 slices shown]
[im 1/32]
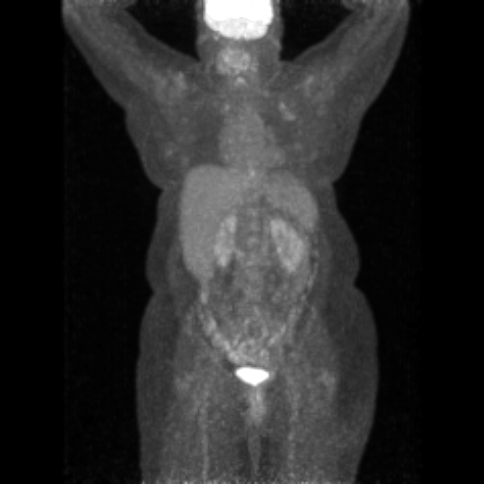

[Series 605: range-ct sk_thigh 5.0 hd_fov-tra-<alpha range> · 4 of 231 slices shown]
[im 1/231]
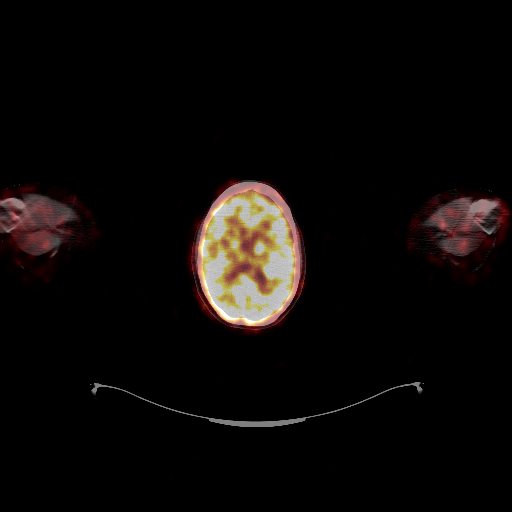
[im 58/231]
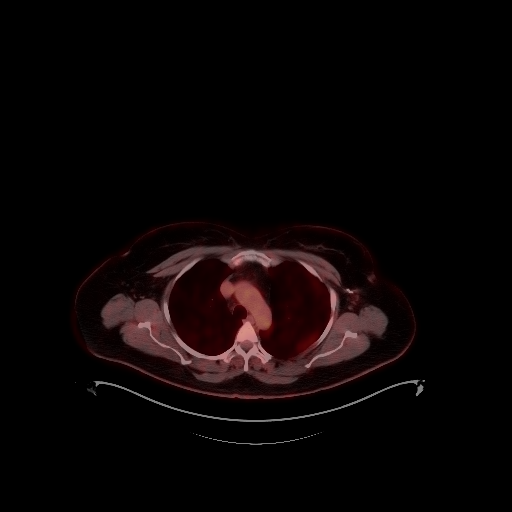
[im 173/231]
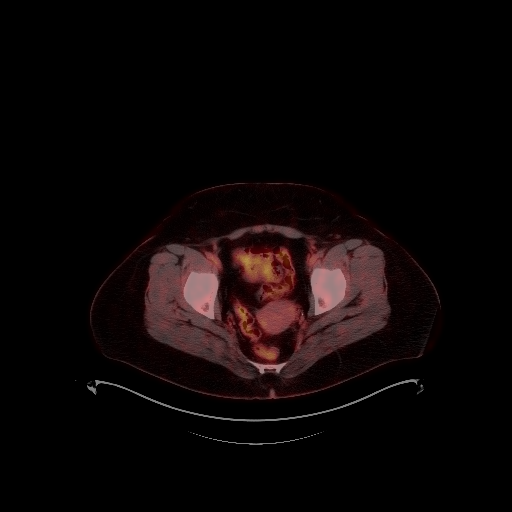
[im 231/231]
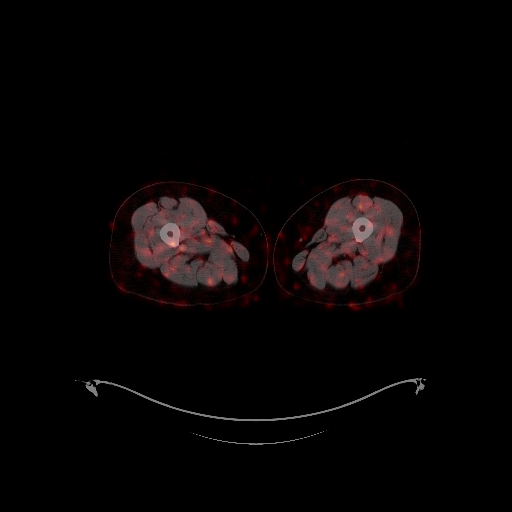

[Series 1222: results mm oncology reading · 3.0mm · 0.50mm/px · 1 of 2 slices shown]
[im 1/2]
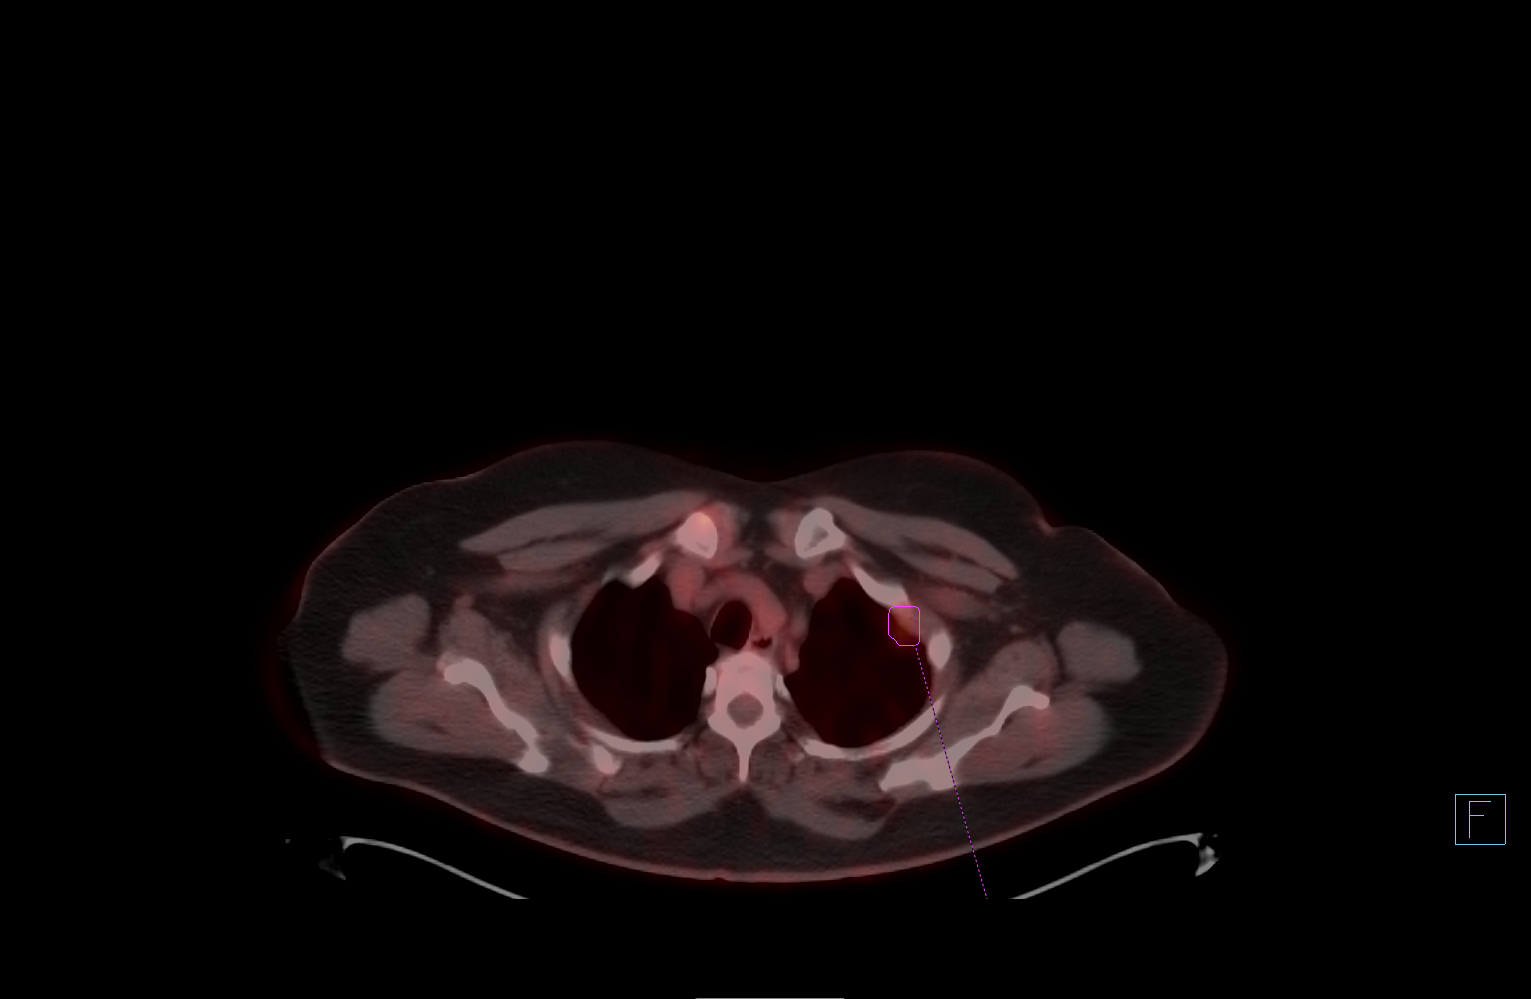

[21 of 25 positions shown; findings below may reference images not displayed]

FINDINGS: Mediastinal blood pool activity: SUV max

NECK: No hypermetabolic lymph nodes in the neck.

Incidental CT findings: none

CHEST:

No hypermetabolic axillary, mediastinal or hilar lymph nodes.
Previously described hypermetabolic subcarinal lymph node has
resolved.

There is low level hypermetabolism within plaque-like upper left
pleural lesions, which have substantially decreased in size and
metabolism. For example, an anterior apical 1.7 x 1.0 cm upper left
pleural lesion with max SUV 2.2 (series 4/image 53), previously
x 2.2 cm with max SUV 13.2 on 03/24/2017 PET-CT. A posterior apical
3.4 x 0.7 cm upper left pleural lesion with max SUV 1.8 (series
4/image 57), previously 5.4 x 2.7 cm with max SUV 16.4. No new
hypermetabolic left pleural findings.

No hypermetabolic pulmonary findings. No foci of chest wall
hypermetabolism.

Incidental CT findings: No residual pleural effusions. Left axillary
and medial left breast surgical clips are again noted.

ABDOMEN/PELVIS: No abnormal hypermetabolic activity within the
liver, pancreas, adrenal glands, or spleen. No hypermetabolic lymph
nodes in the abdomen or pelvis.

Incidental CT findings: Mild diffuse hepatic steatosis.

SKELETON: No focal hypermetabolic activity to suggest skeletal
metastasis.

Incidental CT findings: none
IMPRESSION: 1. Residual low level hypermetabolism within plaque-like upper left
pleural metastases, substantially decreased in size and metabolism
since 03/24/2017 PET-CT. No residual pleural effusions.
2. No residual hypermetabolic thoracic adenopathy.
3. No new sites of hypermetabolic metastatic disease.
4. Mild diffuse hepatic steatosis.

## 2020-07-10 ENCOUNTER — Ambulatory Visit: Payer: BC Managed Care – PPO | Admitting: Registered"

## 2020-07-31 ENCOUNTER — Ambulatory Visit: Payer: BC Managed Care – PPO | Admitting: Registered"

## 2020-09-04 ENCOUNTER — Other Ambulatory Visit: Payer: Self-pay

## 2020-09-04 ENCOUNTER — Encounter: Payer: Self-pay | Admitting: Registered"

## 2020-09-04 ENCOUNTER — Encounter: Payer: BC Managed Care – PPO | Attending: Family Medicine | Admitting: Registered"

## 2020-09-04 DIAGNOSIS — E119 Type 2 diabetes mellitus without complications: Secondary | ICD-10-CM | POA: Diagnosis not present

## 2020-09-04 NOTE — Patient Instructions (Signed)
Goals:  Follow Diabetes Meal Plan as instructed  Eat 3 meals and 2 snacks, every 3-5 hrs  Aim to have 1/2 plate of non-starchy vegetables, 1/4 plate of starch/grain, and 1/4 plate of lean protein  Snacks to include a source of carbohydrates + lean protein.   Add lean protein foods to meals/snacks  Monitor glucose levels as instructed by your doctor  Aim for 30 mins of physical activity daily  Bring food record and glucose log to your next nutrition visit

## 2020-09-04 NOTE — Progress Notes (Signed)
Diabetes Self-Management Education  Visit Type:  First/Initial  Appt. Start Time: 11:15 Appt. End Time: 12:25  09/04/2020  [name], identified by name and date of birth, is a 55 y.o. female with a diagnosis of Diabetes: Type 2.   ASSESSMENT  Per referral: recent labs from 04/2020 reveal elevated Glu (285). Previous labs from 2019 show elevated cholesterol (237), elevated Trg (348), and elevated A1c (7.4). States she was at our office in 2018 but it was 3 days after being diagnosed with Stage 4 breast cancer therefore nutrition visit consisted on more cancer-related nutrition counseling rather than diabetes-related nutrition counseling.   States she is embarrassed to tell family other than husband about diabetes diagnosis due to being the only overweight person in her family. States she has a fear of diabetes due to hearing about people losing limbs.   States she wears Dexcom CGM. Reports she started wearing in Nov-Dec 2021. States it expired a few days ago and is unable to see previous numbers. States she would check it frequently throughout the day. States numbers were: FBS (140-150) and after meals (180-200).  States she is sporadic with exercise. Has membership at [employer]. States she will go 5 days/week in one week and then not got for 3 weeks. States she will walk in neighborhood as weather permits. Does not like weather too hot or cold.   Pt expectations: wants to know how to eat correctly to keep A1c and how to manage hypo-/hyperglycemia.    There were no vitals taken for this visit. There is no height or weight on file to calculate BMI.    Diabetes Self-Management Education - 09/04/20 1123      Health Coping   How would you rate your overall health? Good      Psychosocial Assessment   Patient Belief/Attitude about Diabetes Motivated to manage diabetes   also afraid and in denial   Self-care barriers None    Self-management support Family;Friends    Patient Concerns  Nutrition/Meal planning    Special Needs None    Learning Readiness Not Ready      Complications   Last HgB A1C per patient/outside source 7.4 %    How often do you check your blood sugar? > 4 times/day    Fasting Blood glucose range (mg/dL) 130-179    Postprandial Blood glucose range (mg/dL) 180-200    Number of hypoglycemic episodes per month 0    Number of hyperglycemic episodes per week 1    Can you tell when your blood sugar is high? Yes    What do you do if your blood sugar is high? will eat nuts    Have you had a dilated eye exam in the past 12 months? No    Have you had a dental exam in the past 12 months? Yes    Are you checking your feet? No      Dietary Intake   Breakfast typically skips or Chobani yogurt    Lunch taco soup + cornbread    Snack (afternoon) chocolate covered almonds    Dinner rotisserie chicken + green beans + corn + mashed potatoes    Snack (evening) pound cake    Beverage(s) diet Dr. Malachi Bonds (32 oz), water (3*16 oz; 48 oz)      Exercise   Exercise Type ADL's    How many days per week to you exercise? 0    How many minutes per day do you exercise? 0  Total minutes per week of exercise 0      Patient Education   Previous Diabetes Education No    Disease state  Definition of diabetes, type 1 and 2, and the diagnosis of diabetes;Factors that contribute to the development of diabetes    Nutrition management  Role of diet in the treatment of diabetes and the relationship between the three main macronutrients and blood glucose level;Food label reading, portion sizes and measuring food.;Reviewed blood glucose goals for pre and post meals and how to evaluate the patients' food intake on their blood glucose level.;Effects of alcohol on blood glucose and safety factors with consumption of alcohol.;Meal options for control of blood glucose level and chronic complications.    Physical activity and exercise  Role of exercise on diabetes management, blood pressure  control and cardiac health.    Monitoring Purpose and frequency of SMBG.;Taught/discussed recording of test results and interpretation of SMBG.;Interpreting lab values - A1C, lipid, urine microalbumina.;Identified appropriate SMBG and/or A1C goals.;Daily foot exams    Acute complications Taught treatment of hypoglycemia - the 15 rule.;Discussed and identified patients' treatment of hyperglycemia.    Chronic complications Lipid levels, blood glucose control and heart disease;Assessed and discussed foot care and prevention of foot problems;Relationship between chronic complications and blood glucose control;Applicable immunizations;Reviewed with patient heart disease, higher risk of, and prevention    Psychosocial adjustment Role of stress on diabetes;Worked with patient to identify barriers to care and solutions;Helped patient identify a support system for diabetes management;Identified and addressed patients feelings and concerns about diabetes      Individualized Goals (developed by patient)   Nutrition Follow meal plan discussed    Medications take my medication as prescribed    Monitoring  test my blood glucose as discussed    Reducing Risk examine blood glucose patterns;do foot checks daily;increase portions of nuts and seeds;treat hypoglycemia with 15 grams of carbs if blood glucose less than 70mg /dL      Post-Education Assessment   Patient understands the diabetes disease and treatment process. Demonstrates understanding / competency    Patient understands incorporating nutritional management into lifestyle. Demonstrates understanding / competency    Patient undertands incorporating physical activity into lifestyle. Needs Review    Patient understands using medications safely. Demonstrates understanding / competency    Patient understands monitoring blood glucose, interpreting and using results Demonstrates understanding / competency    Patient understands prevention, detection, and treatment  of acute complications. Demonstrates understanding / competency    Patient understands prevention, detection, and treatment of chronic complications. Demonstrates understanding / competency    Patient understands how to develop strategies to address psychosocial issues. Demonstrates understanding / competency    Patient understands how to develop strategies to promote health/change behavior. Demonstrates understanding / competency      Outcomes   Program Status Not Completed           Learning Objective:  Patient will have a greater understanding of diabetes self-management. Patient education plan is to attend individual and/or group sessions per assessed needs and concerns.   Plan:   Patient Instructions  Goals:  Follow Diabetes Meal Plan as instructed  Eat 3 meals and 2 snacks, every 3-5 hrs  Aim to have 1/2 plate of non-starchy vegetables, 1/4 plate of starch/grain, and 1/4 plate of lean protein  Snacks to include a source of carbohydrates + lean protein.   Add lean protein foods to meals/snacks  Monitor glucose levels as instructed by your doctor  Aim for 30  mins of physical activity daily  Bring food record and glucose log to your next nutrition visit     Expected Outcomes:  Demonstrated interest in learning. Expect positive outcomes  Education material provided: ADA - How to Thrive: A Guide for Your Journey with Diabetes  If problems or questions, patient to contact team via:  Phone and Email  Future DSME appointment: - 4-6 wks

## 2020-09-05 DIAGNOSIS — J3489 Other specified disorders of nose and nasal sinuses: Secondary | ICD-10-CM | POA: Diagnosis not present

## 2020-09-05 DIAGNOSIS — E1169 Type 2 diabetes mellitus with other specified complication: Secondary | ICD-10-CM | POA: Diagnosis not present

## 2020-09-05 DIAGNOSIS — E785 Hyperlipidemia, unspecified: Secondary | ICD-10-CM | POA: Diagnosis not present

## 2020-09-05 DIAGNOSIS — Z79899 Other long term (current) drug therapy: Secondary | ICD-10-CM | POA: Diagnosis not present

## 2020-10-08 ENCOUNTER — Other Ambulatory Visit: Payer: Self-pay

## 2020-10-08 ENCOUNTER — Telehealth: Payer: Self-pay | Admitting: Oncology

## 2020-10-08 DIAGNOSIS — C50312 Malignant neoplasm of lower-inner quadrant of left female breast: Secondary | ICD-10-CM

## 2020-10-08 DIAGNOSIS — Z170 Estrogen receptor positive status [ER+]: Secondary | ICD-10-CM

## 2020-10-08 NOTE — Telephone Encounter (Signed)
Rescheduled appointment per 05/09 schedule message. Patient is aware.

## 2020-10-09 ENCOUNTER — Other Ambulatory Visit: Payer: Self-pay

## 2020-10-09 ENCOUNTER — Inpatient Hospital Stay: Payer: BC Managed Care – PPO | Attending: Oncology

## 2020-10-09 ENCOUNTER — Ambulatory Visit (HOSPITAL_COMMUNITY)
Admission: RE | Admit: 2020-10-09 | Discharge: 2020-10-09 | Disposition: A | Discharge status: Home / Self Care | Payer: BC Managed Care – PPO | Source: Ambulatory Visit | Attending: Oncology | Admitting: Oncology

## 2020-10-09 DIAGNOSIS — C50312 Malignant neoplasm of lower-inner quadrant of left female breast: Secondary | ICD-10-CM | POA: Diagnosis not present

## 2020-10-09 DIAGNOSIS — C7800 Secondary malignant neoplasm of unspecified lung: Secondary | ICD-10-CM | POA: Insufficient documentation

## 2020-10-09 DIAGNOSIS — Z8043 Family history of malignant neoplasm of testis: Secondary | ICD-10-CM | POA: Diagnosis not present

## 2020-10-09 DIAGNOSIS — Z8249 Family history of ischemic heart disease and other diseases of the circulatory system: Secondary | ICD-10-CM | POA: Insufficient documentation

## 2020-10-09 DIAGNOSIS — Z79899 Other long term (current) drug therapy: Secondary | ICD-10-CM | POA: Insufficient documentation

## 2020-10-09 DIAGNOSIS — J90 Pleural effusion, not elsewhere classified: Secondary | ICD-10-CM | POA: Diagnosis not present

## 2020-10-09 DIAGNOSIS — Z808 Family history of malignant neoplasm of other organs or systems: Secondary | ICD-10-CM | POA: Insufficient documentation

## 2020-10-09 DIAGNOSIS — Z8349 Family history of other endocrine, nutritional and metabolic diseases: Secondary | ICD-10-CM | POA: Insufficient documentation

## 2020-10-09 DIAGNOSIS — R918 Other nonspecific abnormal finding of lung field: Secondary | ICD-10-CM | POA: Diagnosis not present

## 2020-10-09 DIAGNOSIS — C773 Secondary and unspecified malignant neoplasm of axilla and upper limb lymph nodes: Secondary | ICD-10-CM | POA: Insufficient documentation

## 2020-10-09 DIAGNOSIS — Z8049 Family history of malignant neoplasm of other genital organs: Secondary | ICD-10-CM | POA: Insufficient documentation

## 2020-10-09 DIAGNOSIS — Z79811 Long term (current) use of aromatase inhibitors: Secondary | ICD-10-CM | POA: Insufficient documentation

## 2020-10-09 DIAGNOSIS — Z803 Family history of malignant neoplasm of breast: Secondary | ICD-10-CM | POA: Diagnosis not present

## 2020-10-09 DIAGNOSIS — Z170 Estrogen receptor positive status [ER+]: Secondary | ICD-10-CM | POA: Diagnosis not present

## 2020-10-09 DIAGNOSIS — J929 Pleural plaque without asbestos: Secondary | ICD-10-CM | POA: Diagnosis not present

## 2020-10-09 DIAGNOSIS — K760 Fatty (change of) liver, not elsewhere classified: Secondary | ICD-10-CM | POA: Diagnosis not present

## 2020-10-09 DIAGNOSIS — Z801 Family history of malignant neoplasm of trachea, bronchus and lung: Secondary | ICD-10-CM | POA: Insufficient documentation

## 2020-10-09 DIAGNOSIS — Z923 Personal history of irradiation: Secondary | ICD-10-CM | POA: Insufficient documentation

## 2020-10-09 DIAGNOSIS — C50919 Malignant neoplasm of unspecified site of unspecified female breast: Secondary | ICD-10-CM | POA: Diagnosis not present

## 2020-10-09 LAB — CBC WITH DIFFERENTIAL (CANCER CENTER ONLY)
Abs Immature Granulocytes: 0.02 10*3/uL (ref 0.00–0.07)
Basophils Absolute: 0 10*3/uL (ref 0.0–0.1)
Basophils Relative: 1 %
Eosinophils Absolute: 0.1 10*3/uL (ref 0.0–0.5)
Eosinophils Relative: 2 %
HCT: 40.1 % (ref 36.0–46.0)
Hemoglobin: 14.1 g/dL (ref 12.0–15.0)
Immature Granulocytes: 1 %
Lymphocytes Relative: 35 %
Lymphs Abs: 1.1 10*3/uL (ref 0.7–4.0)
MCH: 31.3 pg (ref 26.0–34.0)
MCHC: 35.2 g/dL (ref 30.0–36.0)
MCV: 88.9 fL (ref 80.0–100.0)
Monocytes Absolute: 0.3 10*3/uL (ref 0.1–1.0)
Monocytes Relative: 9 %
Neutro Abs: 1.7 10*3/uL (ref 1.7–7.7)
Neutrophils Relative %: 52 %
Platelet Count: 329 10*3/uL (ref 150–400)
RBC: 4.51 MIL/uL (ref 3.87–5.11)
RDW: 12.9 % (ref 11.5–15.5)
WBC Count: 3.2 10*3/uL — ABNORMAL LOW (ref 4.0–10.5)
nRBC: 0 % (ref 0.0–0.2)

## 2020-10-09 LAB — CMP (CANCER CENTER ONLY)
ALT: 19 U/L (ref 0–44)
AST: 15 U/L (ref 15–41)
Albumin: 4.4 g/dL (ref 3.5–5.0)
Alkaline Phosphatase: 99 U/L (ref 38–126)
Anion gap: 13 (ref 5–15)
BUN: 10 mg/dL (ref 6–20)
CO2: 22 mmol/L (ref 22–32)
Calcium: 9.6 mg/dL (ref 8.9–10.3)
Chloride: 105 mmol/L (ref 98–111)
Creatinine: 0.8 mg/dL (ref 0.44–1.00)
GFR, Estimated: >60 mL/min (ref >60)
Glucose, Bld: 170 mg/dL — ABNORMAL HIGH (ref 70–99)
Potassium: 4.2 mmol/L (ref 3.5–5.1)
Sodium: 140 mmol/L (ref 135–145)
Total Bilirubin: 0.8 mg/dL (ref 0.3–1.2)
Total Protein: 7.3 g/dL (ref 6.5–8.1)

## 2020-10-09 MED ORDER — IOHEXOL 300 MG/ML  SOLN
75.0000 mL | Freq: Once | INTRAMUSCULAR | Status: AC | PRN
  Administered 2020-10-09: 75 mL via INTRAVENOUS

## 2020-10-10 ENCOUNTER — Ambulatory Visit: Payer: BC Managed Care – PPO | Admitting: Registered"

## 2020-10-10 ENCOUNTER — Inpatient Hospital Stay (HOSPITAL_BASED_OUTPATIENT_CLINIC_OR_DEPARTMENT_OTHER): Payer: BC Managed Care – PPO | Admitting: Oncology

## 2020-10-10 ENCOUNTER — Other Ambulatory Visit: Payer: Self-pay

## 2020-10-10 VITALS — BP 131/71 | HR 80 | Temp 97.9°F | Resp 18 | Ht 64.0 in | Wt 194.6 lb

## 2020-10-10 DIAGNOSIS — Z79899 Other long term (current) drug therapy: Secondary | ICD-10-CM | POA: Diagnosis not present

## 2020-10-10 DIAGNOSIS — J90 Pleural effusion, not elsewhere classified: Secondary | ICD-10-CM | POA: Diagnosis not present

## 2020-10-10 DIAGNOSIS — Z79811 Long term (current) use of aromatase inhibitors: Secondary | ICD-10-CM | POA: Diagnosis not present

## 2020-10-10 DIAGNOSIS — C7800 Secondary malignant neoplasm of unspecified lung: Secondary | ICD-10-CM | POA: Diagnosis not present

## 2020-10-10 DIAGNOSIS — Z801 Family history of malignant neoplasm of trachea, bronchus and lung: Secondary | ICD-10-CM | POA: Diagnosis not present

## 2020-10-10 DIAGNOSIS — Z923 Personal history of irradiation: Secondary | ICD-10-CM | POA: Diagnosis not present

## 2020-10-10 DIAGNOSIS — Z8049 Family history of malignant neoplasm of other genital organs: Secondary | ICD-10-CM | POA: Diagnosis not present

## 2020-10-10 DIAGNOSIS — Z808 Family history of malignant neoplasm of other organs or systems: Secondary | ICD-10-CM | POA: Diagnosis not present

## 2020-10-10 DIAGNOSIS — C50312 Malignant neoplasm of lower-inner quadrant of left female breast: Secondary | ICD-10-CM

## 2020-10-10 DIAGNOSIS — R12 Heartburn: Secondary | ICD-10-CM

## 2020-10-10 DIAGNOSIS — Z170 Estrogen receptor positive status [ER+]: Secondary | ICD-10-CM

## 2020-10-10 DIAGNOSIS — Z803 Family history of malignant neoplasm of breast: Secondary | ICD-10-CM | POA: Diagnosis not present

## 2020-10-10 DIAGNOSIS — Z7189 Other specified counseling: Secondary | ICD-10-CM

## 2020-10-10 DIAGNOSIS — K760 Fatty (change of) liver, not elsewhere classified: Secondary | ICD-10-CM | POA: Diagnosis not present

## 2020-10-10 DIAGNOSIS — Z8249 Family history of ischemic heart disease and other diseases of the circulatory system: Secondary | ICD-10-CM | POA: Diagnosis not present

## 2020-10-10 DIAGNOSIS — C773 Secondary and unspecified malignant neoplasm of axilla and upper limb lymph nodes: Secondary | ICD-10-CM | POA: Diagnosis not present

## 2020-10-10 DIAGNOSIS — Z8043 Family history of malignant neoplasm of testis: Secondary | ICD-10-CM | POA: Diagnosis not present

## 2020-10-10 DIAGNOSIS — Z8349 Family history of other endocrine, nutritional and metabolic diseases: Secondary | ICD-10-CM | POA: Diagnosis not present

## 2020-10-10 LAB — CANCER ANTIGEN 27.29: CA 27.29: 16.2 U/mL (ref 0.0–38.6)

## 2020-10-10 MED ORDER — OMEPRAZOLE 40 MG PO CPDR: 40.0000 mg | DELAYED_RELEASE_CAPSULE | Freq: Every day | ORAL | 3 refills | Status: AC

## 2020-10-10 NOTE — Progress Notes (Signed)
[place]  Telephone:([phone]) [phone] Fax:(336) [phone]    ID: Debbie [name] OB: 05/08/1966  MR#: [id number]  NMM#:[id number]  Patient Care Team: Harlan Stains, MD as PCP - General (Family Medicine) Magrinat, [name], MD as Consulting Physician (Oncology) [name], MD as Referring Physician (Surgery) [name], MD as Consulting Physician (Obstetrics and Gynecology) [name], MD as Consulting Physician (General Surgery) Kyung Hostetter, MD as Consulting Physician (Radiation Oncology) [name], [name], MD as Consulting Physician (Allergy and Immunology) [name], MD as Consulting Physician (Gastroenterology) [name], MD as Referring Physician (Internal Medicine) [name], [name], MD as Referring Physician (Oncology) OTHER MD:    CHIEF COMPLAINT: 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  She is accompanied by her husband.  Since her last visit, she underwent restaging chest CT yesterday, 10/09/2020, showing:  1. No evidence of breast cancer recurrence or metastasis. 2. Stable pleural thickening in the LEFT upper lobe. 3. No lymphadenopathy or pulmonary nodules.  She continues on anastrozole.  Hot flashes are relatively mild..  Vaginal dryness is Debbie a major concern and she denies dyspareunia.  She also continues on palbociclib.  She has had some bowel issues with this but now appears to be tolerating it better.  REVIEW OF SYSTEMS: Debbie [name] is pleased her son is graduating from high school.  He had a job at [employer] but lost it because he would Debbie agree to vaccination.  She is also worried about her mother in [place] who has Parkinson's disease and is downsizing.  For exercise Debbie [name] takes walks and also goes to [employer].  She is trying to lose  some weight.  A detailed review of systems today was otherwise stable.   COVID 19 VACCINATION STATUS: fully vaccinated, booster pending   BREAST CANCER HISTORY: As per previously documented note:  Debbie [name] had routine yearly screening mammography at the breast center 06/19/2013 showing a possible mass in the left breast. The right breast was unremarkable. On 06/30/2013 additional views of the left breast showed an irregular spiculated dense mass measuring 2.2 cm in the lower inner quadrant. This was palpable at the 8:00 location. Ultrasound showed an irregular hypoechoic mass measuring 2.3 cm and in the left axilla a dominant abnormal appearing lymph node with cortical thickening measuring 1.2 cm.  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  Subsequently the patient underwent bilateral breast MRI. This showed her breasts to be dense (composition C.). In the left breast at the 8:00 position there was a 2.4 cm enhancing mass containing a biopsy clip. There were no additional areas of concern. In the axilla on the left there was a 2.6 cm enlarged left axillary lymph node. There was also a 0.5 cm left internal mammary lymph nodes noted. The right side was unremarkable.  The patient's subsequent history is as detailed below.   PAST MEDICAL HISTORY: Past Medical History:  Diagnosis Date  . Abnormal Pap smear   . Anxiety   . Breast cancer ([place]) 2015  ER+/PR+/Her2- Left - Dr. Earlie Lou  . GERD (gastroesophageal reflux disease)    occasional  . Hyperlipidemia    no meds   . IBS (irritable bowel syndrome)   . IBS (irritable  bowel syndrome)   . Menorrhagia   . Migraines    menstral  . Migraines   . MVP (mitral valve prolapse)    echo 2/15-mitral valve normal-no regurg,no stenosis  . Pre-diabetes    no meds  . Radiation 02/14/14-04/03/14   Left Breast  . Seasonal allergies   . SVD (spontaneous vaginal delivery)    x 3    PAST SURGICAL HISTORY: Past Surgical History:  Procedure Laterality Date  . BREAST SURGERY Left 2015   lumpectomy x 2 - Dr. Jana Hakim  . COLONOSCOPY    . DILATATION & CURRETTAGE/HYSTEROSCOPY WITH RESECTOCOPE N/A 09/14/2017   Procedure: DILATATION & CURETTAGE/HYSTEROSCOPY WITH RESECTOCOPE;  Surgeon: [name], MD;  Location: [facility];  Service: Gynecology;  Laterality: N/A;  . ENDOMETRIAL ABLATION  2013  . PORT-A-CATH REMOVAL N/A 05/15/2014   Procedure: REMOVAL PORT-A-CATH;  Surgeon: [name], MD;  Location: [facility];  Service: General;  Laterality: N/A;  . PORTACATH PLACEMENT Left 07/18/2013   Procedure: INSERTION PORT-A-CATH;  Surgeon: [name], MD;  Location: [facility];  Service: General;  Laterality: Left;  . SVD      x 3  . VULVA /PERINEUM BIOPSY  2011  . WISDOM TEETH REMOVAL      FAMILY HISTORY Family History  Problem Relation Age of Onset  . Cancer Maternal Grandmother        BREAST AND UTERINE CANCER  . Uterine cancer Maternal Grandmother 84  . Hyperlipidemia Mother   . Hypertension Mother   . Multiple myeloma Father 26  . Lung cancer Maternal Manufacturing engineer  . Testicular cancer Other 57  The patient's father is living, currently age 23 (as of November 2020).. He has a history of multiple myeloma. The patient's mother is 60 and has Parkinson's.  She was diagnosed with ductal carcinoma in situ in 2016. The patient's parents live in [place], [place]. The patient had no brothers, 2 sisters. There is no history of breast or ovarian cancer in the family. She believes one of her grandfathers died from lung cancer and another from pancreatic  cancer, but is Debbie sure   GYNECOLOGIC HISTORY:  Menarche age 62, first live birth age 47, which the patient understands is an independent risk factor for breast cancer. She is GX P3. She was still having regular periods at the start of chemotherapy. LMP was March 2015. She tells me Dr. Cletis Media checked her labs December 2015 and found that she was in the menopausal range. Note that her husband is s/p vasectomy.    SOCIAL HISTORY: (updated May 2022). 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 Her 3 children are [name] (21), [name] (18) and [name] (15).  Martie Round is planning to go back to [employer].  Harmon Pier is graduating from high school May 2022.  The patient attends a local [place].    ADVANCED DIRECTIVES: In the absence of any document to the contrary the patient's husband is her healthcare power of attorney   HEALTH MAINTENANCE: Social History   Tobacco Use  . Smoking status: Never Smoker  . Smokeless  tobacco: Never Used  Vaping Use  . Vaping Use: Never used  Substance Use Topics  . Alcohol use: No  . Drug use: No     Colonoscopy: January 2015  FAO:ZHYQMVHQ 2014  Bone density:  Lipid panel:  Allergies  Allergen Reactions  . Dust Mite Extract Itching    Current Outpatient Medications  Medication Sig Dispense Refill  . ALPRAZolam (XANAX) 0.5 MG tablet Take 20 minutes before MRI; may repeat once (Patient Debbie taking: Reported on 04/21/2018) 5 tablet 0  . anastrozole (ARIMIDEX) 1 MG tablet Take 1 tablet (1 mg total) by mouth daily. 90 tablet 4  . atorvastatin (LIPITOR) 10 MG tablet Take 10 mg by mouth daily.    . cetirizine (ZYRTEC) 10 MG tablet Take 10 mg by mouth daily.    . Cholecalciferol (VITAMIN D3) 2000 units TABS Take by mouth daily.     . cholestyramine (QUESTRAN) 4 g packet Take 1 packet (4 g total) by  mouth daily as needed. 12 each 12  . dapagliflozin propanediol (FARXIGA) 10 MG TABS tablet Take by mouth daily.    . frovatriptan (FROVA) 2.5 MG tablet Take 1 tablet (2.5 mg total) by mouth as needed for migraine. If recurs, may repeat after 2 hours. Max of 3 tabs in 24 hours. 10 tablet 0  . Lancets (ONETOUCH DELICA PLUS [id number]) MISC     . omeprazole (PRILOSEC) 40 MG capsule Take 1 capsule (40 mg total) by mouth daily. 60 capsule 3  . oxymetazoline (AFRIN) 0.05 % nasal spray Place 2 sprays into the nose daily as needed. congestion    . palbociclib (IBRANCE) 100 MG tablet TAKE 1 TABLET DAILY. TAKE FOR 21 DAYS ON, 7 DAYS OFF, REPEAT EVERY 28 DAYS 21 tablet 12  . PARoxetine (PAXIL) 20 MG tablet Take 20 mg by mouth every morning.     No current facility-administered medications for this visit.    OBJECTIVE:  white woman who appears well  Vitals:   10/10/20 1241  BP: 131/71  Pulse: 80  Resp: 18  Temp: 97.9 F (36.6 C)  SpO2: 99%    Wt Readings from Last 3 Encounters:  10/10/20 194 lb 9.6 oz (88.3 kg)  04/08/20 205 lb 8 oz (93.2 kg)  10/16/19 205 lb 1.6 oz (93 kg)   Body mass index is 33.4 kg/m.      ECOG FS:1 - Symptomatic but completely ambulatory   Sclerae unicteric, EOMs intact Wearing a mask No cervical or supraclavicular adenopathy Lungs no rales or rhonchi Heart regular rate and rhythm Abd soft, nontender, positive bowel sounds MSK no focal spinal tenderness, no upper extremity lymphedema Neuro: nonfocal, well oriented, appropriate affect Breasts: The right breast is unremarkable.  The left breast is status postlumpectomy and radiation, with no evidence of local recurrence.  Both axillae are benign.   LABS:     Component Value Date/Time   NA 140 10/09/2020 1518   NA 139 04/28/2017 1326   K 4.2 10/09/2020 1518   K 4.1 04/28/2017 1326   CL 105 10/09/2020 1518   CO2 22 10/09/2020 1518   CO2 20 (L) 04/28/2017 1326   GLUCOSE 170 (H) 10/09/2020 1518   GLUCOSE 152  (H) 04/28/2017 1326   BUN 10 10/09/2020 1518   BUN 12.7 04/28/2017 1326   CREATININE 0.80 10/09/2020 1518   CREATININE 0.8 04/28/2017 1326   CALCIUM 9.6 10/09/2020 1518   CALCIUM 9.0 04/28/2017 1326   PROT 7.3 10/09/2020 1518   PROT 7.3 04/28/2017 1326  ALBUMIN 4.4 10/09/2020 1518   ALBUMIN 4.1 04/28/2017 1326   AST 15 10/09/2020 1518   AST 23 04/28/2017 1326   ALT 19 10/09/2020 1518   ALT 37 04/28/2017 1326   ALKPHOS 99 10/09/2020 1518   ALKPHOS 94 04/28/2017 1326   BILITOT 0.8 10/09/2020 1518   BILITOT 0.42 04/28/2017 1326   GFRNONAA >60 10/09/2020 1518   GFRAA >60 01/15/2020 1545    I No results found for: SPEP  Lab Results  Component Value Date   WBC 3.2 (L) 10/09/2020   NEUTROABS 1.7 10/09/2020   HGB 14.1 10/09/2020   HCT 40.1 10/09/2020   MCV 88.9 10/09/2020   PLT 329 10/09/2020      Chemistry      Component Value Date/Time   NA 140 10/09/2020 1518   NA 139 04/28/2017 1326   K 4.2 10/09/2020 1518   K 4.1 04/28/2017 1326   CL 105 10/09/2020 1518   CO2 22 10/09/2020 1518   CO2 20 (L) 04/28/2017 1326   BUN 10 10/09/2020 1518   BUN 12.7 04/28/2017 1326   CREATININE 0.80 10/09/2020 1518   CREATININE 0.8 04/28/2017 1326      Component Value Date/Time   CALCIUM 9.6 10/09/2020 1518   CALCIUM 9.0 04/28/2017 1326   ALKPHOS 99 10/09/2020 1518   ALKPHOS 94 04/28/2017 1326   AST 15 10/09/2020 1518   AST 23 04/28/2017 1326   ALT 19 10/09/2020 1518   ALT 37 04/28/2017 1326   BILITOT 0.8 10/09/2020 1518   BILITOT 0.42 04/28/2017 1326       No results found for: LABCA2  No components found for: LABCA125  No results for input(s): INR in the last 168 hours.  Urinalysis    Component Value Date/Time   BILIRUBINUR Negative 09/22/2011 1451   PROTEINUR Trace 09/22/2011 1451   UROBILINOGEN negative 09/22/2011 1451   NITRITE Negative 09/22/2011 1451   LEUKOCYTESUR Trace 09/22/2011 1451    Lab Results  Component Value Date   CA2729 16.2 10/09/2020    CA2729 20.8 04/03/2020   CA2729 19.7 01/15/2020   CA2729 27.0 10/11/2019   CA2729 17.2 07/25/2019    STUDIES: CT Chest W Contrast  Result Date: 10/10/2020 CLINICAL DATA:  Breast cancer restaging. Evaluate response to therapy. EXAM: CT CHEST WITH CONTRAST TECHNIQUE: Multidetector CT imaging of the chest was performed during intravenous contrast administration. CONTRAST:  3m OMNIPAQUE IOHEXOL 300 MG/ML  SOLN COMPARISON:  PET-CT scan 04/03/2020, CT chest 10/11/2019 FINDINGS: Cardiovascular: No significant vascular findings. Normal heart size. No pericardial effusion. Mediastinum/Nodes: Prior LEFT axillary nodal dissection. No adenopathy in the LEFT axilla. No internal mammary adenopathy. No mediastinal hilar adenopathy. Trachea and esophagus normal. Lungs/Pleura: Mild pleural thickening in the anterior LEFT upper lobe is similar comparison exams. Mild pleural thickening posterior LEFT upper lobe is also similar to comparison exams. No interval change. Subtle subpleural reticulation in the LEFT upper lobe (related to radiation treatment). No suspicious nodularity in the LEFT lung or RIGHT lung. Upper Abdomen: Limited view of the liver, kidneys, pancreas are unremarkable. Normal adrenal glands. Musculoskeletal: No aggressive osseous lesion IMPRESSION: 1. No evidence of breast cancer recurrence or metastasis. 2. Stable pleural thickening in the LEFT upper lobe. 3. No lymphadenopathy or pulmonary nodules. Electronically Signed   By: SSuzy BouchardM.D.   On: 10/10/2020 15:26     ASSESSMENT: 55y.o. [place] woman status post left breast lower inner quadrant and left axillary lymph node biopsy 06/30/2013 for a clinical T2 N1-2, stage IIB/IIIA  invasive ductal carcinoma, grade 2, estrogen and progesterone receptors both strongly positive, HER-2 nonamplified, with an MIB-1 of 36%.  (1) there is a 5 mm left internal mammary lymph node which was negative on PET scan  (2) cyclophosphamide and doxorubicin  started 08/04/2014, given in dose dense fashion x4 with Neulasta day 2; completed 09/14/2013, followed by paclitaxel again given in dose dense fashion x4, completed 11/09/2013  (3) left lumpectomy and axillary lymph node dissection 12/13/2013 at [facility] showed a residual pT2 pN1 (mic), stage IIB invasive ductal carcinoma; margins positive for DCIS cleared with subsequent surgery (01/03/2014)  (4) adjuvant radiation therapy completed 04/03/2014  (5) tamoxifen started December 2015, discontinued October 2018  (6) hepatic steatosis: documented on abdomianl US 08/07/2014, with elevated but stable LFTs,  (7) genetic testing 07/24/2013 through the  "Women's Hereditary Panel", performed at [facility], found no deleterious mutations in  ATM, BRCA1, BRCA2, BRIP1, CDH1, CHEK2 (c.1100delC only), EPCAM, MLH1, MSH2, MSH6, NBN, PALB2, PMS2, PTEN, RAD51C, STK11, and TP53.  (8) METASTATIC DISEASE: October 2018 -- chest CT scan 03/17/2017 shows left pleural thickening and a left pleural effusion; PET scan 03/24/2017 shows no liver or bone involvement and no involvement of the right lung.  (a) biopsy of the left pleural mass 04/06/2017 confirms metastatic breast cancer, estrogen and progesterone receptor positive, HER-2 negative  (b) CA 27-27 was 86.3 on 03/30/2017  (9) anastrozole started October 2018  (a) palbociclib added 04/12/2017 at 125 mg/day 21/7  (b) CT chest on 01/26/2018 shows improvement in nodular and plaque like pleural metastatic disease without complete resolution  (c) PET scan on 04/19/18 consistent with continued improvement of metastatic disease  (d) CT scan of the chest 10/05/2018 shows no active disease.  (e) palbociclib dose decreased to 100 mg May 2020 because we are checking patient's labs less frequently during the pandemic  (f) PET scan 04/12/2019 shows continuing response  (g) CT of the chest 10/10/2020 shows no active disease   PLAN: Hibba is 2-1/2 years out from  definitive diagnosis of metastatic breast cancer.  She has no symptoms related to her disease and currently no measurable disease.   She continues on palbociclib and anastrozole, with excellent tolerance.  The plan is to continue these agents until there is evidence of disease progression.  Accordingly she will see me in about 3 months.  At that time we will plan no further restaging studies which may include bone density and mammography as well as a PET scan later this year  Total encounter time 25 minutes.Sarajane Jews C. Rjay Revolorio, MD  10/10/20 6:13 PM Medical Oncology and Hematology [facility] [place], [place] [address] Tel. [phone]    Fax. [phone]   I, Wilburn Mylar, am acting as scribe for Dr. [name]. Emanuelle Hammerstrom.  I, Lurline Del MD, have reviewed the above documentation for accuracy and completeness, and I agree with the above.   *Total Encounter Time as defined by the Centers for Medicare and Medicaid Services includes, in addition to the face-to-face time of a patient visit (documented in the note above) non-face-to-face time: obtaining and reviewing outside history, ordering and reviewing medications, tests or procedures, care coordination (communications with other health care professionals or caregivers) and documentation in the medical record.

## 2020-10-11 ENCOUNTER — Telehealth: Payer: Self-pay | Admitting: Oncology

## 2020-10-11 NOTE — Telephone Encounter (Signed)
Scheduled per 5/12 los. Called and spoke with pt confirmed 8/11 appts

## 2020-10-22 ENCOUNTER — Other Ambulatory Visit: Payer: BC Managed Care – PPO

## 2020-10-24 ENCOUNTER — Ambulatory Visit: Payer: BC Managed Care – PPO | Admitting: Oncology

## 2020-12-24 ENCOUNTER — Telehealth: Payer: Self-pay | Admitting: *Deleted

## 2020-12-24 NOTE — Telephone Encounter (Signed)
This RN spoke with pt per her call stating she will be moving to [place] on 01/04/2021 per her husbands new work position.  " We just found out a couple of weeks ago and kind of in the busyness of everything forgot about how I need to manage my medications with you "  This RN discussed above-  Debbie [name] is currently on d4 of current cycle of Ibrance.  She receives the medication thru [facility] mail order pharmacy.  She is currently scheduled for visit 01/09/2021.  She does not have a current facility or MD in the [facility] area ( she is also close to [place]).  Note she has family in [place] and friends there who have or had cancer and was going to discuss with them who is good to see for breast cancer.  Plan per discussion - appointment rescheduled to 12/31/2020.  Records will be available for her to take with her.  Refills will be arranged per visit.  Once she gets to [place] she will let us know of names or facilities and we can make referral.  This note will be forwarded to MD for review of above plan and any known MD's in the [facility] area.

## 2020-12-26 DIAGNOSIS — L820 Inflamed seborrheic keratosis: Secondary | ICD-10-CM | POA: Diagnosis not present

## 2020-12-26 DIAGNOSIS — L858 Other specified epidermal thickening: Secondary | ICD-10-CM | POA: Diagnosis not present

## 2020-12-31 ENCOUNTER — Inpatient Hospital Stay: Payer: BC Managed Care – PPO

## 2020-12-31 ENCOUNTER — Inpatient Hospital Stay: Payer: BC Managed Care – PPO | Attending: Oncology | Admitting: Oncology

## 2020-12-31 ENCOUNTER — Other Ambulatory Visit: Payer: Self-pay

## 2020-12-31 VITALS — BP 130/71 | HR 84 | Temp 97.9°F | Resp 16 | Ht 64.0 in | Wt 195.4 lb

## 2020-12-31 DIAGNOSIS — K760 Fatty (change of) liver, not elsewhere classified: Secondary | ICD-10-CM | POA: Diagnosis not present

## 2020-12-31 DIAGNOSIS — Z7984 Long term (current) use of oral hypoglycemic drugs: Secondary | ICD-10-CM | POA: Diagnosis not present

## 2020-12-31 DIAGNOSIS — I341 Nonrheumatic mitral (valve) prolapse: Secondary | ICD-10-CM | POA: Insufficient documentation

## 2020-12-31 DIAGNOSIS — Z923 Personal history of irradiation: Secondary | ICD-10-CM | POA: Diagnosis not present

## 2020-12-31 DIAGNOSIS — C50212 Malignant neoplasm of upper-inner quadrant of left female breast: Secondary | ICD-10-CM | POA: Diagnosis not present

## 2020-12-31 DIAGNOSIS — Z79811 Long term (current) use of aromatase inhibitors: Secondary | ICD-10-CM | POA: Diagnosis not present

## 2020-12-31 DIAGNOSIS — Z801 Family history of malignant neoplasm of trachea, bronchus and lung: Secondary | ICD-10-CM | POA: Insufficient documentation

## 2020-12-31 DIAGNOSIS — C50312 Malignant neoplasm of lower-inner quadrant of left female breast: Secondary | ICD-10-CM | POA: Diagnosis not present

## 2020-12-31 DIAGNOSIS — R232 Flushing: Secondary | ICD-10-CM | POA: Diagnosis not present

## 2020-12-31 DIAGNOSIS — Z170 Estrogen receptor positive status [ER+]: Secondary | ICD-10-CM | POA: Insufficient documentation

## 2020-12-31 DIAGNOSIS — K589 Irritable bowel syndrome without diarrhea: Secondary | ICD-10-CM | POA: Diagnosis not present

## 2020-12-31 DIAGNOSIS — C7800 Secondary malignant neoplasm of unspecified lung: Secondary | ICD-10-CM

## 2020-12-31 DIAGNOSIS — R160 Hepatomegaly, not elsewhere classified: Secondary | ICD-10-CM | POA: Diagnosis not present

## 2020-12-31 DIAGNOSIS — K219 Gastro-esophageal reflux disease without esophagitis: Secondary | ICD-10-CM | POA: Diagnosis not present

## 2020-12-31 DIAGNOSIS — Z79899 Other long term (current) drug therapy: Secondary | ICD-10-CM | POA: Insufficient documentation

## 2020-12-31 DIAGNOSIS — C787 Secondary malignant neoplasm of liver and intrahepatic bile duct: Secondary | ICD-10-CM | POA: Diagnosis not present

## 2020-12-31 DIAGNOSIS — Z809 Family history of malignant neoplasm, unspecified: Secondary | ICD-10-CM | POA: Insufficient documentation

## 2020-12-31 DIAGNOSIS — E785 Hyperlipidemia, unspecified: Secondary | ICD-10-CM | POA: Diagnosis not present

## 2020-12-31 LAB — COMPREHENSIVE METABOLIC PANEL
ALT: 22 U/L (ref 0–44)
AST: 13 U/L — ABNORMAL LOW (ref 15–41)
Albumin: 4.3 g/dL (ref 3.5–5.0)
Alkaline Phosphatase: 98 U/L (ref 38–126)
Anion gap: 11 (ref 5–15)
BUN: 14 mg/dL (ref 6–20)
CO2: 21 mmol/L — ABNORMAL LOW (ref 22–32)
Calcium: 10.1 mg/dL (ref 8.9–10.3)
Chloride: 108 mmol/L (ref 98–111)
Creatinine, Ser: 0.85 mg/dL (ref 0.44–1.00)
GFR, Estimated: >60 mL/min (ref >60)
Glucose, Bld: 217 mg/dL — ABNORMAL HIGH (ref 70–99)
Potassium: 4.2 mmol/L (ref 3.5–5.1)
Sodium: 140 mmol/L (ref 135–145)
Total Bilirubin: 0.8 mg/dL (ref 0.3–1.2)
Total Protein: 7.2 g/dL (ref 6.5–8.1)

## 2020-12-31 LAB — CBC WITH DIFFERENTIAL/PLATELET
Abs Immature Granulocytes: 0.02 10*3/uL (ref 0.00–0.07)
Basophils Absolute: 0 10*3/uL (ref 0.0–0.1)
Basophils Relative: 0 %
Eosinophils Absolute: 0 10*3/uL (ref 0.0–0.5)
Eosinophils Relative: 1 %
HCT: 39.4 % (ref 36.0–46.0)
Hemoglobin: 13.9 g/dL (ref 12.0–15.0)
Immature Granulocytes: 1 %
Lymphocytes Relative: 27 %
Lymphs Abs: 1 10*3/uL (ref 0.7–4.0)
MCH: 31 pg (ref 26.0–34.0)
MCHC: 35.3 g/dL (ref 30.0–36.0)
MCV: 87.9 fL (ref 80.0–100.0)
Monocytes Absolute: 0.3 10*3/uL (ref 0.1–1.0)
Monocytes Relative: 7 %
Neutro Abs: 2.3 10*3/uL (ref 1.7–7.7)
Neutrophils Relative %: 64 %
Platelets: 339 10*3/uL (ref 150–400)
RBC: 4.48 MIL/uL (ref 3.87–5.11)
RDW: 13.1 % (ref 11.5–15.5)
WBC: 3.6 10*3/uL — ABNORMAL LOW (ref 4.0–10.5)
nRBC: 0 % (ref 0.0–0.2)

## 2020-12-31 MED ORDER — ALPRAZOLAM 0.5 MG PO TABS: ORAL_TABLET | ORAL | 0 refills | Status: AC

## 2020-12-31 MED ORDER — ALPRAZOLAM 0.5 MG PO TABS: ORAL_TABLET | ORAL | 0 refills | Status: DC

## 2020-12-31 MED ORDER — PALBOCICLIB 100 MG PO TABS: ORAL_TABLET | ORAL | 12 refills | Status: DC

## 2020-12-31 MED ORDER — ANASTROZOLE 1 MG PO TABS: 1.0000 mg | ORAL_TABLET | Freq: Every day | ORAL | 4 refills | Status: DC

## 2020-12-31 NOTE — Progress Notes (Signed)
[facility]  [phone] Fax:([phone]) [phone]    ID: Debbie [name] OB: [birth year]  MR#: [id number]  CSN#:[id number]  Patient Care Team: Harlan Stains, MD as PCP - General (Family Medicine) Matther Labell, [name], MD as Consulting Physician (Oncology) [name], MD as Referring Physician (Surgery) [name], MD as Consulting Physician (Obstetrics and Gynecology) [name], MD as Consulting Physician (General Surgery) Kyung Hesketh, MD as Consulting Physician (Radiation Oncology) [name], [name], MD as Consulting Physician (Allergy and Immunology) [name], MD as Consulting Physician (Gastroenterology) [name], MD as Referring Physician (Internal Medicine) [name], [name], MD as Referring Physician (Oncology) OTHER MD:    CHIEF COMPLAINT: 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  She is moving to [place] where her husband and has already moved to and she is here today for closing visit  She continues on anastrozole.  Hot flashes are relatively mild..  Vaginal dryness is not a major concern and she denies dyspareunia.  She also continues on palbociclib.  She has had some bowel issues with this but now appears to be tolerating it better.  She is on the second week of the current cycle   REVIEW OF SYSTEMS: Debbie [name] tells me she is originally from [place].  Her father has multiple myeloma and her mother has Parkinson's.  She wants to be closer to them and she has a lot of family in that area so they are moving to the [place] area.  Their 14 year old son and youngest daughter will be going by her 47 year old who just finished high school will be staying around, going to [employer] and spending time with his girlfriend.  The dog and cat are also  going.  She is planning to leave by early next week   [place]: fully vaccinated, booster pending   BREAST CANCER HISTORY: As per previously documented note:  Debbie [name] had routine yearly screening mammography at the breast center 06/19/2013 showing a possible mass in the left breast. The right breast was unremarkable. On 06/30/2013 additional views of the left breast showed an irregular spiculated dense mass measuring 2.2 cm in the lower inner quadrant. This was palpable at the 8:00 location. Ultrasound showed an irregular hypoechoic mass measuring 2.3 cm and in the left axilla a dominant abnormal appearing lymph node with cortical thickening measuring 1.2 cm.  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  Subsequently the patient underwent bilateral breast MRI. This showed her breasts to be dense (composition C.). In the left breast at the 8:00 position there was a 2.4 cm enhancing mass containing a biopsy clip. There were no additional areas of concern. In the axilla on the left there was a 2.6 cm enlarged left axillary lymph node. There was also a 0.5 cm left internal mammary lymph nodes noted. The right side was unremarkable.  The patient's subsequent history is as detailed below.   PAST MEDICAL HISTORY: Past Medical History:  Diagnosis Date   Abnormal Pap smear  Anxiety    Breast cancer ([facility]) 2015   ER+/PR+/Her2- Left - Dr. Earlie Lou   GERD (gastroesophageal reflux disease)    occasional   Hyperlipidemia    no meds    IBS (irritable bowel syndrome)    IBS (irritable bowel syndrome)     Menorrhagia    Migraines    menstral   Migraines    MVP (mitral valve prolapse)    echo 2/15-mitral valve normal-no regurg,no stenosis   Pre-diabetes    no meds   Radiation 02/14/14-04/03/14   Left Breast   Seasonal allergies    SVD (spontaneous vaginal delivery)    x 3    PAST SURGICAL HISTORY: Past Surgical History:  Procedure Laterality Date   BREAST SURGERY Left 2015   lumpectomy x 2 - Dr. Jana Hakim   COLONOSCOPY     DILATATION & CURRETTAGE/HYSTEROSCOPY WITH RESECTOCOPE N/A 09/14/2017   Procedure: DILATATION & CURETTAGE/HYSTEROSCOPY WITH RESECTOCOPE;  Surgeon: [name], MD;  Location: [facility];  Service: Gynecology;  Laterality: N/A;   ENDOMETRIAL ABLATION  2013   PORT-A-CATH REMOVAL N/A 05/15/2014   Procedure: REMOVAL PORT-A-CATH;  Surgeon: [name], MD;  Location: [facility];  Service: General;  Laterality: N/A;   PORTACATH PLACEMENT Left 07/18/2013   Procedure: INSERTION PORT-A-CATH;  Surgeon: [name], MD;  Location: [facility];  Service: General;  Laterality: Left;   SVD      x 3   VULVA /PERINEUM BIOPSY  2011   WISDOM TEETH REMOVAL      FAMILY HISTORY Family History  Problem Relation Age of Onset   Cancer Maternal Grandmother        BREAST AND UTERINE CANCER   Uterine cancer Maternal Grandmother 62   Hyperlipidemia Mother    Hypertension Mother    Multiple myeloma Father 67   Lung cancer Maternal Grandfather        welder   Testicular cancer Other 86  The patient's father is living, currently age 14 (as of November 2020).. He has a history of multiple myeloma. The patient's mother is 27 and has Parkinson's.  She was diagnosed with ductal carcinoma in situ in 2016. The patient's parents live in [place], [place]. The patient had no brothers, 2 sisters. There is no history of breast or ovarian cancer in the family. She believes one of her grandfathers died from lung cancer and another from pancreatic cancer, but is not sure   GYNECOLOGIC  HISTORY:  Menarche age 88, first live birth age 41, which the patient understands is an independent risk factor for breast cancer. She is GX P3. She was still having regular periods at the start of chemotherapy. LMP was March 2015. She tells me Dr. Cletis Media checked her labs December 2015 and found that she was in the menopausal range. Note that her husband is s/p vasectomy.    SOCIAL HISTORY: (updated May 2022). 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 Her 3 children are [name] (21), [name] (18) and [name] (15).  Martie Round is planning to go back to [employer].  Harmon Pier is graduating from high school May 2022.  The patient attends a local [place].    ADVANCED DIRECTIVES: In the absence of any document to the contrary the patient's husband is her healthcare power of attorney   HEALTH MAINTENANCE: Social History  Tobacco Use   Smoking status: Never   Smokeless tobacco: Never  Vaping Use   Vaping Use: Never used  Substance Use Topics   Alcohol use: No   Drug use: No     Colonoscopy: January 2015  QQP:YPPJKDTO 2014  Bone density:  Lipid panel:  Allergies  Allergen Reactions   Dust Mite Extract Itching    Current Outpatient Medications  Medication Sig Dispense Refill   ALPRAZolam (XANAX) 0.5 MG tablet Take 20 minutes before MRI; may repeat once (Patient not taking: Reported on 04/21/2018) 5 tablet 0   anastrozole (ARIMIDEX) 1 MG tablet Take 1 tablet (1 mg total) by mouth daily. 90 tablet 4   atorvastatin (LIPITOR) 10 MG tablet Take 10 mg by mouth daily.     cetirizine (ZYRTEC) 10 MG tablet Take 10 mg by mouth daily.     Cholecalciferol (VITAMIN D3) 2000 units TABS Take by mouth daily.      cholestyramine (QUESTRAN) 4 g packet Take 1 packet (4 g total) by mouth daily as needed. 12 each 12   dapagliflozin propanediol  (FARXIGA) 10 MG TABS tablet Take by mouth daily.     frovatriptan (FROVA) 2.5 MG tablet Take 1 tablet (2.5 mg total) by mouth as needed for migraine. If recurs, may repeat after 2 hours. Max of 3 tabs in 24 hours. 10 tablet 0   Lancets (ONETOUCH DELICA PLUS [id number]) MISC      omeprazole (PRILOSEC) 40 MG capsule Take 1 capsule (40 mg total) by mouth daily. 60 capsule 3   oxymetazoline (AFRIN) 0.05 % nasal spray Place 2 sprays into the nose daily as needed. congestion     palbociclib (IBRANCE) 100 MG tablet TAKE 1 TABLET DAILY. TAKE FOR 21 DAYS ON, 7 DAYS OFF, REPEAT EVERY 28 DAYS 21 tablet 12   PARoxetine (PAXIL) 20 MG tablet Take 20 mg by mouth every morning.     No current facility-administered medications for this visit.    OBJECTIVE:  white woman in no acute distress  Vitals:   12/31/20 1609  BP: 130/71  Pulse: 84  Resp: 16  Temp: 97.9 F (36.6 C)  SpO2: 96%     Wt Readings from Last 3 Encounters:  12/31/20 195 lb 6.4 oz (88.6 kg)  10/10/20 194 lb 9.6 oz (88.3 kg)  04/08/20 205 lb 8 oz (93.2 kg)   Body mass index is 33.54 kg/m.      ECOG FS:1 - Symptomatic but completely ambulatory   Sclerae unicteric, EOMs intact Wearing a mask No cervical or supraclavicular adenopathy Lungs no rales or rhonchi Heart regular rate and rhythm Abd soft, nontender, positive bowel sounds MSK no focal spinal tenderness, no upper extremity lymphedema Neuro: nonfocal, well oriented, appropriate affect Breasts: Right breast is benign.  The left breast has undergone lumpectomy and radiation.  The cosmetic result is excellent.  There is no evidence of local recurrence.  Both axillae are benign.   LABS:     Component Value Date/Time   NA 140 12/31/2020 1555   NA 139 04/28/2017 1326   K 4.2 12/31/2020 1555   K 4.1 04/28/2017 1326   CL 108 12/31/2020 1555   CO2 21 (L) 12/31/2020 1555   CO2 20 (L) 04/28/2017 1326   GLUCOSE 217 (H) 12/31/2020 1555   GLUCOSE 152 (H) 04/28/2017 1326   BUN  14 12/31/2020 1555   BUN 12.7 04/28/2017 1326   CREATININE 0.85 12/31/2020 1555   CREATININE 0.80 10/09/2020 1518   CREATININE 0.8 04/28/2017  1326   CALCIUM 10.1 12/31/2020 1555   CALCIUM 9.0 04/28/2017 1326   PROT 7.2 12/31/2020 1555   PROT 7.3 04/28/2017 1326   ALBUMIN 4.3 12/31/2020 1555   ALBUMIN 4.1 04/28/2017 1326   AST 13 (L) 12/31/2020 1555   AST 15 10/09/2020 1518   AST 23 04/28/2017 1326   ALT 22 12/31/2020 1555   ALT 19 10/09/2020 1518   ALT 37 04/28/2017 1326   ALKPHOS 98 12/31/2020 1555   ALKPHOS 94 04/28/2017 1326   BILITOT 0.8 12/31/2020 1555   BILITOT 0.8 10/09/2020 1518   BILITOT 0.42 04/28/2017 1326   GFRNONAA >60 12/31/2020 1555   GFRNONAA >60 10/09/2020 1518   GFRAA >60 01/15/2020 1545    I No results found for: SPEP  Lab Results  Component Value Date   WBC 3.6 (L) 12/31/2020   NEUTROABS 2.3 12/31/2020   HGB 13.9 12/31/2020   HCT 39.4 12/31/2020   MCV 87.9 12/31/2020   PLT 339 12/31/2020      Chemistry      Component Value Date/Time   NA 140 12/31/2020 1555   NA 139 04/28/2017 1326   K 4.2 12/31/2020 1555   K 4.1 04/28/2017 1326   CL 108 12/31/2020 1555   CO2 21 (L) 12/31/2020 1555   CO2 20 (L) 04/28/2017 1326   BUN 14 12/31/2020 1555   BUN 12.7 04/28/2017 1326   CREATININE 0.85 12/31/2020 1555   CREATININE 0.80 10/09/2020 1518   CREATININE 0.8 04/28/2017 1326      Component Value Date/Time   CALCIUM 10.1 12/31/2020 1555   CALCIUM 9.0 04/28/2017 1326   ALKPHOS 98 12/31/2020 1555   ALKPHOS 94 04/28/2017 1326   AST 13 (L) 12/31/2020 1555   AST 15 10/09/2020 1518   AST 23 04/28/2017 1326   ALT 22 12/31/2020 1555   ALT 19 10/09/2020 1518   ALT 37 04/28/2017 1326   BILITOT 0.8 12/31/2020 1555   BILITOT 0.8 10/09/2020 1518   BILITOT 0.42 04/28/2017 1326       No results found for: LABCA2  No components found for: LABCA125  No results for input(s): INR in the last 168 hours.  Urinalysis    Component Value Date/Time    BILIRUBINUR Negative 09/22/2011 1451   PROTEINUR Trace 09/22/2011 1451   UROBILINOGEN negative 09/22/2011 1451   NITRITE Negative 09/22/2011 1451   LEUKOCYTESUR Trace 09/22/2011 1451    Lab Results  Component Value Date   CA2729 16.2 10/09/2020   CA2729 20.8 04/03/2020   CA2729 19.7 01/15/2020   CA2729 27.0 10/11/2019   CA2729 17.2 07/25/2019    STUDIES:  No results found.   ASSESSMENT: 55 y.o. [place] woman status post left breast lower inner quadrant and left axillary lymph node biopsy 06/30/2013 for a clinical T2 N1-2, stage IIB/IIIA invasive ductal carcinoma, grade 2, estrogen and progesterone receptors both strongly positive, HER-2 nonamplified, with an MIB-1 of 36%.  (1) a 5 mm left internal mammary lymph node was negative on PET scan  (2) cyclophosphamide and doxorubicin started 08/04/2014, given in dose dense fashion x4 with Neulasta day 2; completed 09/14/2013, followed by paclitaxel given in dose dense fashion x4, completed 11/09/2013  (3) left lumpectomy and axillary lymph node dissection 12/13/2013 at [facility] showed a residual pT2 pN1 (mic), stage IIB invasive ductal carcinoma; (a) margins positive for DCIS cleared with subsequent surgery (01/03/2014)  (4) adjuvant radiation therapy completed 04/03/2014  (5) tamoxifen started December 2015, discontinued October 2018  (6) hepatic steatosis: documented on  abdomianl US 08/07/2014, with elevated but stable LFTs,  (7) genetic testing 07/24/2013 through the  "Women's Hereditary Panel", performed at [facility], found no deleterious mutations in  ATM, BRCA1, BRCA2, BRIP1, CDH1, CHEK2 (c.1100delC only), EPCAM, MLH1, MSH2, MSH6, NBN, PALB2, PMS2, PTEN, RAD51C, STK11, and TP53.  (8) METASTATIC DISEASE: October 2018 -- chest CT scan 03/17/2017 shows left pleural thickening and a left pleural effusion; PET scan 03/24/2017 shows no liver or bone involvement and no involvement of the right lung.  (a) biopsy of  the left pleural mass 04/06/2017 confirms metastatic breast cancer, estrogen and progesterone receptor positive, HER-2 negative  (b) CA 27-27 was 86.3 on 03/30/2017  (9) anastrozole started October 2018  (a) palbociclib added 04/12/2017 at 125 mg/day 21/7  (b) CT chest on 01/26/2018 shows improvement in nodular and plaque like pleural metastatic disease without complete resolution  (c) PET scan on 04/19/18 consistent with continued improvement of metastatic disease  (d) CT scan of the chest 10/05/2018 shows no active disease.  (e) palbociclib dose decreased to 100 mg May 2020 because we are checking patient's labs less frequently during the pandemic  (f) PET scan 04/12/2019 shows continuing response  (g) CT of the chest 10/11/2019 shows no active disease  (h) PET scan 04/03/2020 shows no hypermetabolic metastases including at the site of the left anterior pleural thickening previously noted.  There is hepatic steatosis and hepatomegaly.  (i) bilateral diagnostic mammography 04/22/2020 shows breast density category C, no evidence of neoplasia  PLAN: Debbie [name] is close to 4 years out from definitive diagnosis of metastatic breast cancer.  Her disease is very well controlled.  She is tolerating rating her treatment well.  Accordingly we are making no changes in her therapy.  Today we discussed hepatic steatosis.  She understands this is a common cause of cirrhosis.  Once the liver is scarred with cirrhosis the process is not reversible but that does not seem to have happened in her case yet.  I strongly recommend that she cut back or avoid carbohydrates and I gave her a list of things to avoid.  I also suggested other things that she can safely eat.  I also recommended 30 to 45 minutes of exercise at least 5 days a week.  Hopefully if she is able to change the eating and exercise habits that way liver imaging in a couple of years may look improved.  She is moving to the [place] area.  There are  certainly a lot of oncologists there and she has 2 friends there who have had breast cancer.  Once she establishes with 1 of those oncologists of course they are welcome to contact us but I have given her a summary of her treatments and reports of her scans pathology and most recent and earliest dictations.  We will be glad to see Debbie [name] at any point in the future if and when the need arises but at this point we are making no further appointments for her here.  Total encounter time 25 minutes.Debbie [name] Jews C. Dessa Ledee, MD  12/31/20 4:36 PM Medical Oncology and Hematology [facility] [place], [place] [address] Tel. [phone]    Fax. [phone]   I, Wilburn Mylar, am acting as scribe for Dr. [name]. Daylene Vandenbosch.  I, Lurline Del MD, have reviewed the above documentation for accuracy and completeness, and I agree with the above.   *Total Encounter Time as defined by the Centers for Medicare and Medicaid  Services includes, in addition to the face-to-face time of a patient visit (documented in the note above) non-face-to-face time: obtaining and reviewing outside history, ordering and reviewing medications, tests or procedures, care coordination (communications with other health care professionals or caregivers) and documentation in the medical record.

## 2020-12-31 NOTE — Addendum Note (Signed)
Addended by: Chauncey Cruel on: 12/31/2020 05:55 PM   Modules accepted: Orders

## 2021-01-01 DIAGNOSIS — E785 Hyperlipidemia, unspecified: Secondary | ICD-10-CM | POA: Diagnosis not present

## 2021-01-01 DIAGNOSIS — E1169 Type 2 diabetes mellitus with other specified complication: Secondary | ICD-10-CM | POA: Diagnosis not present

## 2021-01-01 DIAGNOSIS — E559 Vitamin D deficiency, unspecified: Secondary | ICD-10-CM | POA: Diagnosis not present

## 2021-01-01 DIAGNOSIS — C50919 Malignant neoplasm of unspecified site of unspecified female breast: Secondary | ICD-10-CM | POA: Diagnosis not present

## 2021-01-01 LAB — CANCER ANTIGEN 27.29: CA 27.29: 20.3 U/mL (ref 0.0–38.6)

## 2021-01-09 ENCOUNTER — Ambulatory Visit: Payer: BC Managed Care – PPO | Admitting: Oncology

## 2021-01-09 ENCOUNTER — Other Ambulatory Visit: Payer: BC Managed Care – PPO

## 2021-02-07 ENCOUNTER — Other Ambulatory Visit: Payer: Self-pay | Admitting: *Deleted

## 2021-02-07 MED ORDER — PALBOCICLIB 100 MG PO TABS: ORAL_TABLET | ORAL | 12 refills | Status: AC

## 2021-02-07 NOTE — Telephone Encounter (Signed)
Pt called to this RN to state she now has new insurance and needs a prior authorization for Ibrance to be filled.  She does not have her new insurance card but per her communication with her new insurer - she was informed to process thought [id number].  This RN called above and was able to give pt's name and dob to obtain Key for submission for PA thru Covermy meds website.  Request submitted and obtained authorization.  This RN called given number for [facility] to verify which address to escribe medication.  Obtained correct mail order pharmacy and sent transcribed prescription of Ibrance for continuity of administration.  This RN called pt - obtained identified VM- message left per above.

## 2021-02-13 ENCOUNTER — Telehealth: Payer: Self-pay | Admitting: *Deleted

## 2021-02-13 NOTE — Telephone Encounter (Signed)
Pt called and left VM with new insurance information:  [employer]  Group # [id number] ID # [id number] Bill group [id number]   This information will be forwarded to the Oral Chemo pharm team.

## 2021-02-13 NOTE — Telephone Encounter (Signed)
This RN spoke with [place] per her call stating she is being informed by her mail order pharmacy that the [place] is "$ 2000 ".  " I am so upset- they said I didn't have any co pay assistance - I don't know what to do "  Of note pt has recently relocated to [place] - her husband has new insurance but they do not have the insurance cards presently.  This note will be forwarded to our oral chemo pharmacy team for possible assistance.  This RN did ask pt to obtain the name and policy number of her new insurance carrier so we know who to contact.  Return call number for the patient is [phone].

## 2021-02-14 ENCOUNTER — Other Ambulatory Visit (HOSPITAL_COMMUNITY): Payer: Self-pay

## 2021-04-18 ENCOUNTER — Other Ambulatory Visit: Payer: Self-pay | Admitting: *Deleted

## 2021-04-18 DIAGNOSIS — Z170 Estrogen receptor positive status [ER+]: Secondary | ICD-10-CM

## 2021-04-18 DIAGNOSIS — C7800 Secondary malignant neoplasm of unspecified lung: Secondary | ICD-10-CM

## 2021-04-18 DIAGNOSIS — C50312 Malignant neoplasm of lower-inner quadrant of left female breast: Secondary | ICD-10-CM

## 2021-04-21 ENCOUNTER — Other Ambulatory Visit: Payer: Self-pay | Admitting: *Deleted

## 2021-04-22 ENCOUNTER — Other Ambulatory Visit: Payer: Self-pay | Admitting: Oncology

## 2021-05-01 ENCOUNTER — Encounter: Payer: Self-pay | Admitting: Oncology

## 2021-05-01 ENCOUNTER — Other Ambulatory Visit: Payer: Self-pay | Admitting: Oncology

## 2021-05-01 ENCOUNTER — Telehealth: Payer: Self-pay | Admitting: Adult Health

## 2021-05-01 DIAGNOSIS — C7801 Secondary malignant neoplasm of right lung: Secondary | ICD-10-CM

## 2021-05-01 DIAGNOSIS — C50312 Malignant neoplasm of lower-inner quadrant of left female breast: Secondary | ICD-10-CM

## 2021-05-01 NOTE — Telephone Encounter (Signed)
After 15 minutes on hold I was able to speak with the associate medical director for [facility], Dr. Judene Companion.  He denied PET scan.  I reviewed the reasons Dr. Jana Hakim listed for PET scan in his message which I copied below.    He recommended CT chest/abdomen/pelvis first.    Time spent 25 minutes.    Wilber Bihari, NP 05/01/21 5:21 PM Medical Oncology and Hematology [facility] [place], [place] [address] Tel. [phone]    Fax. [phone]  We have been following her disease w yearly PET scans and need equivalent comparison. If they don;t want to do a PET we will have to go back to scans, which will show disease but cannot tell us if it is active, and then we will have to do a PET   Thanks!   GM

## 2021-05-02 ENCOUNTER — Other Ambulatory Visit: Payer: Self-pay | Admitting: *Deleted

## 2021-05-02 DIAGNOSIS — C50312 Malignant neoplasm of lower-inner quadrant of left female breast: Secondary | ICD-10-CM

## 2021-05-05 ENCOUNTER — Other Ambulatory Visit: Payer: Self-pay | Admitting: Oncology

## 2021-05-19 ENCOUNTER — Other Ambulatory Visit (HOSPITAL_COMMUNITY): Payer: BC Managed Care – PPO

## 2021-05-19 ENCOUNTER — Ambulatory Visit (HOSPITAL_COMMUNITY)
Admission: RE | Admit: 2021-05-19 | Discharge: 2021-05-19 | Disposition: A | Discharge status: Home / Self Care | Payer: BC Managed Care – PPO | Source: Ambulatory Visit | Attending: Oncology | Admitting: Oncology

## 2021-05-19 ENCOUNTER — Other Ambulatory Visit: Payer: Self-pay

## 2021-05-19 ENCOUNTER — Inpatient Hospital Stay: Payer: BC Managed Care – PPO | Attending: Oncology

## 2021-05-19 ENCOUNTER — Encounter (HOSPITAL_COMMUNITY): Payer: Self-pay

## 2021-05-19 DIAGNOSIS — C50312 Malignant neoplasm of lower-inner quadrant of left female breast: Secondary | ICD-10-CM | POA: Diagnosis present

## 2021-05-19 DIAGNOSIS — C7801 Secondary malignant neoplasm of right lung: Secondary | ICD-10-CM | POA: Diagnosis present

## 2021-05-19 DIAGNOSIS — Z170 Estrogen receptor positive status [ER+]: Secondary | ICD-10-CM | POA: Insufficient documentation

## 2021-05-19 DIAGNOSIS — K760 Fatty (change of) liver, not elsewhere classified: Secondary | ICD-10-CM | POA: Insufficient documentation

## 2021-05-19 LAB — CMP (CANCER CENTER ONLY)
ALT: 26 U/L (ref 0–44)
AST: 21 U/L (ref 15–41)
Albumin: 4.1 g/dL (ref 3.5–5.0)
Alkaline Phosphatase: 97 U/L (ref 38–126)
Anion gap: 10 (ref 5–15)
BUN: 12 mg/dL (ref 6–20)
CO2: 20 mmol/L — ABNORMAL LOW (ref 22–32)
Calcium: 8.8 mg/dL — ABNORMAL LOW (ref 8.9–10.3)
Chloride: 110 mmol/L (ref 98–111)
Creatinine: 0.79 mg/dL (ref 0.44–1.00)
GFR, Estimated: >60 mL/min (ref >60)
Glucose, Bld: 164 mg/dL — ABNORMAL HIGH (ref 70–99)
Potassium: 4.2 mmol/L (ref 3.5–5.1)
Sodium: 140 mmol/L (ref 135–145)
Total Bilirubin: 0.7 mg/dL (ref 0.3–1.2)
Total Protein: 7 g/dL (ref 6.5–8.1)

## 2021-05-19 LAB — CBC WITH DIFFERENTIAL (CANCER CENTER ONLY)
Abs Immature Granulocytes: 0.01 10*3/uL (ref 0.00–0.07)
Basophils Absolute: 0 10*3/uL (ref 0.0–0.1)
Basophils Relative: 1 %
Eosinophils Absolute: 0.1 10*3/uL (ref 0.0–0.5)
Eosinophils Relative: 2 %
HCT: 39.3 % (ref 36.0–46.0)
Hemoglobin: 13.6 g/dL (ref 12.0–15.0)
Immature Granulocytes: 0 %
Lymphocytes Relative: 23 %
Lymphs Abs: 0.7 10*3/uL (ref 0.7–4.0)
MCH: 30.8 pg (ref 26.0–34.0)
MCHC: 34.6 g/dL (ref 30.0–36.0)
MCV: 89.1 fL (ref 80.0–100.0)
Monocytes Absolute: 0.2 10*3/uL (ref 0.1–1.0)
Monocytes Relative: 6 %
Neutro Abs: 2 10*3/uL (ref 1.7–7.7)
Neutrophils Relative %: 68 %
Platelet Count: 248 10*3/uL (ref 150–400)
RBC: 4.41 MIL/uL (ref 3.87–5.11)
RDW: 12.5 % (ref 11.5–15.5)
WBC Count: 2.9 10*3/uL — ABNORMAL LOW (ref 4.0–10.5)
nRBC: 0 % (ref 0.0–0.2)

## 2021-05-19 MED ORDER — IOHEXOL 350 MG/ML SOLN
60.0000 mL | Freq: Once | INTRAVENOUS | Status: AC | PRN
  Administered 2021-05-19: 13:00:00 60 mL via INTRAVENOUS

## 2021-05-19 MED ORDER — SODIUM CHLORIDE (PF) 0.9 % IJ SOLN
INTRAMUSCULAR | Status: AC
  Filled 2021-05-19: qty 50

## 2021-05-20 ENCOUNTER — Other Ambulatory Visit (HOSPITAL_COMMUNITY): Payer: BC Managed Care – PPO

## 2021-05-20 LAB — CANCER ANTIGEN 27.29: CA 27.29: 16.4 U/mL (ref 0.0–38.6)

## 2021-05-21 ENCOUNTER — Inpatient Hospital Stay (HOSPITAL_BASED_OUTPATIENT_CLINIC_OR_DEPARTMENT_OTHER): Payer: BC Managed Care – PPO | Admitting: Oncology

## 2021-05-21 ENCOUNTER — Other Ambulatory Visit: Payer: Self-pay

## 2021-05-21 VITALS — BP 136/62 | HR 99 | Temp 97.7°F | Resp 18 | Ht 64.0 in | Wt 200.1 lb

## 2021-05-21 DIAGNOSIS — C50312 Malignant neoplasm of lower-inner quadrant of left female breast: Secondary | ICD-10-CM | POA: Diagnosis present

## 2021-05-21 DIAGNOSIS — Z170 Estrogen receptor positive status [ER+]: Secondary | ICD-10-CM

## 2021-05-21 DIAGNOSIS — C7800 Secondary malignant neoplasm of unspecified lung: Secondary | ICD-10-CM

## 2021-05-21 DIAGNOSIS — K760 Fatty (change of) liver, not elsewhere classified: Secondary | ICD-10-CM | POA: Diagnosis not present

## 2021-05-21 NOTE — Progress Notes (Signed)
[facility]  Telephone:([phone]) [phone] Fax:(336) [phone]    ID: Debbie [name] OB: 11-01-1965  MR#: [id number]  KDX#:[id number]  Patient Care Team: Harlan Stains, MD as PCP - General (Family Medicine) Kamaiya Antilla, [name], MD as Consulting Physician (Oncology) [name], MD as Referring Physician (Surgery) [name], MD as Consulting Physician (Obstetrics and Gynecology) [name], MD as Consulting Physician (General Surgery) Kyung Bognar, MD as Consulting Physician (Radiation Oncology) [name], [name], MD as Consulting Physician (Allergy and Immunology) [name], MD as Consulting Physician (Gastroenterology) [name], MD as Referring Physician (Internal Medicine) [name], [name], MD as Referring Physician (Oncology) OTHER MD:    CHIEF COMPLAINT: 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  She is accompanied by her husband.  They are now living in [facility].  However she has not yet established herself with an oncologist there so she came back here for follow-up.  She underwent restaging chest CT on 05/19/2021 showing: no evidence of progressive metastatic disease; unchanged pleural thickening of left upper lobe.  She continues on anastrozole.  Hot flashes are minimal.  She does have some vaginal dryness issues.  She does not feel this is a major concern however.  She also continues on palbociclib, currently at 100 mg 21/7.Marland Kitchen  Currently she does not report any side effects associated with this.  REVIEW OF SYSTEMS: Devin walks a dog 4 days a week 40 to 45 minutes (she is getting paid to do this!).  She has a very irregular sleep pattern, finally going to sleep around 1 AM and then waking up in the morning at various times.  Aside from these  issues a detailed review of systems today was stable.   COVID 19 VACCINATION STATUS: fully vaccinated, booster x1; also had COVID in 2020   BREAST CANCER HISTORY: As per previously documented note:  [name] had routine yearly screening mammography at the breast center 06/19/2013 showing a possible mass in the left breast. The right breast was unremarkable. On 06/30/2013 additional views of the left breast showed an irregular spiculated dense mass measuring 2.2 cm in the lower inner quadrant. This was palpable at the 8:00 location. Ultrasound showed an irregular hypoechoic mass measuring 2.3 cm and in the left axilla a dominant abnormal appearing lymph node with cortical thickening measuring 1.2 cm.  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  Subsequently the patient underwent bilateral breast MRI. This showed her breasts to be dense (composition C.). In the left breast at the 8:00 position there was a 2.4 cm enhancing mass containing a biopsy clip. There were no additional areas of concern. In the axilla on the left there was a 2.6 cm enlarged left axillary lymph node. There was also a 0.5 cm left internal mammary lymph nodes noted. The right side was unremarkable.  The patient's subsequent history is as detailed below.   PAST MEDICAL HISTORY: Past Medical History:  Diagnosis Date   Abnormal Pap smear    Anxiety  Breast cancer ([place]) 2015   ER+/PR+/Her2- Left - Dr. Earlie Lou   GERD (gastroesophageal reflux disease)    occasional   Hyperlipidemia    no meds    IBS (irritable bowel syndrome)    IBS (irritable bowel  syndrome)    Menorrhagia    Migraines    menstral   Migraines    MVP (mitral valve prolapse)    echo 2/15-mitral valve normal-no regurg,no stenosis   Pre-diabetes    no meds   Radiation 02/14/14-04/03/14   Left Breast   Seasonal allergies    SVD (spontaneous vaginal delivery)    x 3    PAST SURGICAL HISTORY: Past Surgical History:  Procedure Laterality Date   BREAST SURGERY Left 2015   lumpectomy x 2 - Dr. Jana Hakim   COLONOSCOPY     DILATATION & CURRETTAGE/HYSTEROSCOPY WITH RESECTOCOPE N/A 09/14/2017   Procedure: DILATATION & CURETTAGE/HYSTEROSCOPY WITH RESECTOCOPE;  Surgeon: [name], MD;  Location: [facility];  Service: Gynecology;  Laterality: N/A;   ENDOMETRIAL ABLATION  2013   PORT-A-CATH REMOVAL N/A 05/15/2014   Procedure: REMOVAL PORT-A-CATH;  Surgeon: [name], MD;  Location: [facility];  Service: General;  Laterality: N/A;   PORTACATH PLACEMENT Left 07/18/2013   Procedure: INSERTION PORT-A-CATH;  Surgeon: [name], MD;  Location: [facility];  Service: General;  Laterality: Left;   SVD      x 3   VULVA /PERINEUM BIOPSY  2011   WISDOM TEETH REMOVAL      FAMILY HISTORY Family History  Problem Relation Age of Onset   Cancer Maternal Grandmother        BREAST AND UTERINE CANCER   Uterine cancer Maternal Grandmother 28   Hyperlipidemia Mother    Hypertension Mother    Multiple myeloma Father 24   Lung cancer Maternal Grandfather        welder   Testicular cancer Other 6  The patient's father is living, currently age 22 (as of November 2020).. He has a history of multiple myeloma. The patient's mother is 57 and has Parkinson's.  She was diagnosed with ductal carcinoma in situ in 2016. The patient's parents live in [place], [place]. The patient had no brothers, 2 sisters. There is no history of breast or ovarian cancer in the family. She believes one of her grandfathers died from lung cancer and another from pancreatic cancer, but is not  sure   GYNECOLOGIC HISTORY:  Menarche age 25, first live birth age 39, which the patient understands is an independent risk factor for breast cancer. She is GX P3. She was still having regular periods at the start of chemotherapy. LMP was March 2015. She tells me Dr. Cletis Media checked her labs December 2015 and found that she was in the menopausal range. Note that her husband is s/p vasectomy.    SOCIAL HISTORY: (updated 05/2021). Adalei is originally from [place]. 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 Her 3 children are [name] (21), [name] (18) and [name] (15).  Martie Round is planning to go back to school. Harmon Pier is graduated from high school May 2022.  The patient is a Psychologist, forensic    ADVANCED DIRECTIVES: In the absence of any document to the contrary the patient's husband is her healthcare  power of attorney   HEALTH MAINTENANCE: Social History   Tobacco Use   Smoking status: Never   Smokeless tobacco: Never  Vaping Use   Vaping Use: Never used  Substance Use Topics   Alcohol use: No   Drug use: No     Colonoscopy: January 2015  RDE:YCXKGYJE 2014  Bone density:  Lipid panel:  Allergies  Allergen Reactions   Dust Mite Extract Itching    Current Outpatient Medications  Medication Sig Dispense Refill   ALPRAZolam (XANAX) 0.5 MG tablet Take 20 minutes before MRI; may repeat once 10 tablet 0   anastrozole (ARIMIDEX) 1 MG tablet Take 1 tablet (1 mg total) by mouth daily. 90 tablet 4   atorvastatin (LIPITOR) 10 MG tablet Take 10 mg by mouth daily.     cetirizine (ZYRTEC) 10 MG tablet Take 10 mg by mouth daily.     Cholecalciferol (VITAMIN D3) 2000 units TABS Take by mouth daily.      cholestyramine (QUESTRAN) 4 g packet Take 1  packet (4 g total) by mouth daily as needed. 12 each 12   dapagliflozin propanediol (FARXIGA) 10 MG TABS tablet Take by mouth daily.     frovatriptan (FROVA) 2.5 MG tablet Take 1 tablet (2.5 mg total) by mouth as needed for migraine. If recurs, may repeat after 2 hours. Max of 3 tabs in 24 hours. 10 tablet 0   Lancets (ONETOUCH DELICA PLUS [id number]) MISC      omeprazole (PRILOSEC) 40 MG capsule Take 1 capsule (40 mg total) by mouth daily. 60 capsule 3   oxymetazoline (AFRIN) 0.05 % nasal spray Place 2 sprays into the nose daily as needed. congestion     palbociclib (IBRANCE) 100 MG tablet TAKE 1 TABLET DAILY. TAKE FOR 21 DAYS ON, 7 DAYS OFF, REPEAT EVERY 28 DAYS 21 tablet 12   PARoxetine (PAXIL) 20 MG tablet Take 20 mg by mouth every morning.     No current facility-administered medications for this visit.    OBJECTIVE:  white woman who appears well  Vitals:   05/21/21 1608  BP: 136/62  Pulse: 99  Resp: 18  Temp: 97.7 F (36.5 C)  SpO2: 96%    Wt Readings from Last 3 Encounters:  05/21/21 200 lb 1.6 oz (90.8 kg)  12/31/20 195 lb 6.4 oz (88.6 kg)  10/10/20 194 lb 9.6 oz (88.3 kg)   Body mass index is 34.35 kg/m.      ECOG FS:1 - Symptomatic but completely ambulatory   Sclerae unicteric, EOMs intact Wearing a mask No cervical or supraclavicular adenopathy Lungs no rales or rhonchi Heart regular rate and rhythm Abd soft, nontender, positive bowel sounds MSK no focal spinal tenderness, no upper extremity lymphedema Neuro: nonfocal, well oriented, appropriate affect Breasts: The right breast is benign.  The left breast is status postlumpectomy and radiation.  There is no evidence of disease recurrence.  Both axillae are benign   LABS:     Component Value Date/Time   NA 140 05/19/2021 1147   NA 139 04/28/2017 1326   K 4.2 05/19/2021 1147   K 4.1 04/28/2017 1326   CL 110 05/19/2021 1147   CO2 20 (L) 05/19/2021 1147   CO2 20 (L) 04/28/2017 1326   GLUCOSE 164 (H)  05/19/2021 1147   GLUCOSE 152 (H) 04/28/2017 1326   BUN 12 05/19/2021 1147   BUN 12.7 04/28/2017 1326   CREATININE 0.79 05/19/2021 1147   CREATININE 0.8 04/28/2017 1326   CALCIUM 8.8 (L) 05/19/2021 1147  CALCIUM 9.0 04/28/2017 1326   PROT 7.0 05/19/2021 1147   PROT 7.3 04/28/2017 1326   ALBUMIN 4.1 05/19/2021 1147   ALBUMIN 4.1 04/28/2017 1326   AST 21 05/19/2021 1147   AST 23 04/28/2017 1326   ALT 26 05/19/2021 1147   ALT 37 04/28/2017 1326   ALKPHOS 97 05/19/2021 1147   ALKPHOS 94 04/28/2017 1326   BILITOT 0.7 05/19/2021 1147   BILITOT 0.42 04/28/2017 1326   GFRNONAA >60 05/19/2021 1147   GFRAA >60 01/15/2020 1545    I No results found for: SPEP  Lab Results  Component Value Date   WBC 2.9 (L) 05/19/2021   NEUTROABS 2.0 05/19/2021   HGB 13.6 05/19/2021   HCT 39.3 05/19/2021   MCV 89.1 05/19/2021   PLT 248 05/19/2021      Chemistry      Component Value Date/Time   NA 140 05/19/2021 1147   NA 139 04/28/2017 1326   K 4.2 05/19/2021 1147   K 4.1 04/28/2017 1326   CL 110 05/19/2021 1147   CO2 20 (L) 05/19/2021 1147   CO2 20 (L) 04/28/2017 1326   BUN 12 05/19/2021 1147   BUN 12.7 04/28/2017 1326   CREATININE 0.79 05/19/2021 1147   CREATININE 0.8 04/28/2017 1326      Component Value Date/Time   CALCIUM 8.8 (L) 05/19/2021 1147   CALCIUM 9.0 04/28/2017 1326   ALKPHOS 97 05/19/2021 1147   ALKPHOS 94 04/28/2017 1326   AST 21 05/19/2021 1147   AST 23 04/28/2017 1326   ALT 26 05/19/2021 1147   ALT 37 04/28/2017 1326   BILITOT 0.7 05/19/2021 1147   BILITOT 0.42 04/28/2017 1326       No results found for: LABCA2  No components found for: LABCA125  No results for input(s): INR in the last 168 hours.  Urinalysis    Component Value Date/Time   BILIRUBINUR Negative 09/22/2011 1451   PROTEINUR Trace 09/22/2011 1451   UROBILINOGEN negative 09/22/2011 1451   NITRITE Negative 09/22/2011 1451   LEUKOCYTESUR Trace 09/22/2011 1451    Lab Results  Component  Value Date   CA2729 16.4 05/19/2021   CA2729 20.3 12/31/2020   CA2729 16.2 10/09/2020   CA2729 20.8 04/03/2020   CA2729 19.7 01/15/2020    STUDIES:  CT Chest W Contrast  Result Date: 05/20/2021 CLINICAL DATA:  Breast cancer EXAM: CT CHEST WITH CONTRAST TECHNIQUE: Multidetector CT imaging of the chest was performed during intravenous contrast administration. CONTRAST:  56m OMNIPAQUE IOHEXOL 350 MG/ML SOLN COMPARISON:  Multiple priors, most recent chest CT dated Oct 09, 2020 FINDINGS: Cardiovascular: Normal heart size. No pericardial effusion. No significant coronary artery calcifications. No significant atherosclerotic disease of the thoracic aorta. Mediastinum/Nodes: Surgical clips of the left axilla. No pathologically enlarged lymph nodes seen in the chest. Lungs/Pleura: Nodular pleural thickening of the posterior left upper lobe, measures up to 6 mm in thickness with associated calcifications, unchanged compared to prior exam. Mild thickening of the anterior left upper on 2, image 24 is also unchanged. Subpleural reticular opacities of the anterior left upper lobe, likely post radiation change. No consolidation, pleural effusion or pneumothorax. No suspicious pulmonary nodules. Upper Abdomen: No acute abnormality. Musculoskeletal: No chest wall abnormality. No acute or significant osseous findings. IMPRESSION: 1. No evidence of progressive metastatic disease in the chest. 2. Unchanged pleural thickening of the left upper lobe. Electronically Signed   By: LYetta GlassmanM.D.   On: 05/20/2021 08:23     ASSESSMENT: 55y.o. [employer]  woman status post left breast lower inner quadrant and left axillary lymph node biopsy 06/30/2013 for a clinical T2 N1-2, stage IIB/IIIA invasive ductal carcinoma, grade 2, estrogen and progesterone receptors both strongly positive, HER-2 nonamplified, with an MIB-1 of 36%.  (1) a 5 mm left internal mammary lymph node was negative on PET scan  (2) cyclophosphamide  and doxorubicin started 08/04/2014, given in dose dense fashion x4 with Neulasta day 2; completed 09/14/2013, followed by paclitaxel given in dose dense fashion x4, completed 11/09/2013  (3) left lumpectomy and axillary lymph node dissection 12/13/2013 at [facility] showed a residual pT2 pN1 (mic), stage IIB invasive ductal carcinoma; (a) margins positive for DCIS cleared with subsequent surgery (01/03/2014)  (4) adjuvant radiation therapy completed 04/03/2014  (5) tamoxifen started December 2015, discontinued October 2018  (6) hepatic steatosis: documented on abdomianl US 08/07/2014, with elevated but stable LFTs,  (7) genetic testing 07/24/2013 through the  "Women's Hereditary Panel", performed at [facility], found no deleterious mutations in  ATM, BRCA1, BRCA2, BRIP1, CDH1, CHEK2 (c.1100delC only), EPCAM, MLH1, MSH2, MSH6, NBN, PALB2, PMS2, PTEN, RAD51C, STK11, and TP53.  (8) METASTATIC DISEASE: October 2018 -- chest CT scan 03/17/2017 shows left pleural thickening and a left pleural effusion; PET scan 03/24/2017 shows no liver or bone involvement and no involvement of the right lung.  (a) biopsy of the left pleural mass 04/06/2017 confirms metastatic breast cancer, estrogen and progesterone receptor positive, HER-2 negative  (b) CA 27-27 was 86.3 on 03/30/2017  (9) anastrozole started October 2018  (a) palbociclib added 04/12/2017 at 125 mg/day 21/7  (b) CT chest on 01/26/2018 shows improvement in nodular and plaque like pleural metastatic disease without complete resolution  (c) PET scan on 04/19/18 consistent with continued improvement of metastatic disease  (d) CT scan of the chest 10/05/2018 shows no active disease.  (e) palbociclib dose decreased to 100 mg May 2020 because we are checking patient's labs less frequently during the pandemic  (f) PET scan 04/12/2019 shows continuing response  (g) CT of the chest 10/11/2019 shows no active disease  (h) PET scan 04/03/2020  shows no hypermetabolic metastases including at the site of the left anterior pleural thickening previously noted.  There is hepatic steatosis and hepatomegaly.  (i) bilateral diagnostic mammography 04/22/2020 shows breast density category C, no evidence of neoplasia  (j) CT scans 10/09/2020 and 05/19/2021 showed no evidence of disease progression.   PLAN: Shaneika is now a little over 4 years out from definitive diagnosis of metastatic breast cancer.  She understands that although the scans show no evidence of disease activity, there is still breast cancer somewhere and at some point it may grow again.  For now however she is getting an excellent result from anastrozole and palbociclib.  I am making no changes in those medications which she is also tolerating well.  I urged her to go ahead and get herself a primary care physician and also medical oncologist ASAP in [facility].  If she develops any problems or issues she does not want to be driving back here just for further evaluation.  We will be glad to provide her doctors with any information but today I gave her a copy of her treatment course is summarized above and I also gave her a copy of her most recent chest CT.  We will be glad to provide them with more information if necessary.   At this point I am not making a return appointment for her here.  We will  be glad to see [name] again at any point in the future if and when the need arises.  Total encounter time 25 minutes.Sarajane Jews C. Marijah Larranaga, MD  05/21/21 5:00 PM Medical Oncology and Hematology [name] [place], [place] [address] Tel. [phone]    Fax. [phone]   I, Wilburn Mylar, am acting as scribe for Dr. [name]. Natashia Roseman.  I, Lurline Del MD, have reviewed the above documentation for accuracy and completeness, and I agree with the above.   *Total Encounter Time as defined by the Centers for Medicare and Medicaid Services includes,  in addition to the face-to-face time of a patient visit (documented in the note above) non-face-to-face time: obtaining and reviewing outside history, ordering and reviewing medications, tests or procedures, care coordination (communications with other health care professionals or caregivers) and documentation in the medical record.

## 2021-08-07 ENCOUNTER — Other Ambulatory Visit: Payer: Self-pay

## 2021-08-07 MED ORDER — ANASTROZOLE 1 MG PO TABS: 1.0000 mg | ORAL_TABLET | Freq: Every day | ORAL | 0 refills | Status: AC

## 2021-08-07 NOTE — Telephone Encounter (Signed)
Pt called and requested rx refill for Anastrozole in [place], [place] as she is running out and does not have an appt with new med-onc until April. Advised pt we would send in 60 day supply with no refills. Pt verbalized thanks and understanding.  ?

## 2023-02-23 ENCOUNTER — Encounter (HOSPITAL_COMMUNITY): Payer: Self-pay
